# Patient Record
Sex: Female | Born: 1940 | Race: Black or African American | Hispanic: No | Smoking: Former smoker
Health system: Southern US, Community
[De-identification: ages and names within clinical notes are randomized; demographics above are authoritative.]

## PROBLEM LIST (undated history)

## (undated) DIAGNOSIS — E875 Hyperkalemia: Secondary | ICD-10-CM

## (undated) DIAGNOSIS — D638 Anemia in other chronic diseases classified elsewhere: Secondary | ICD-10-CM

## (undated) DIAGNOSIS — Z87442 Personal history of urinary calculi: Secondary | ICD-10-CM

## (undated) DIAGNOSIS — R011 Cardiac murmur, unspecified: Secondary | ICD-10-CM

## (undated) DIAGNOSIS — R609 Edema, unspecified: Secondary | ICD-10-CM

## (undated) DIAGNOSIS — J309 Allergic rhinitis, unspecified: Secondary | ICD-10-CM

## (undated) DIAGNOSIS — M5412 Radiculopathy, cervical region: Secondary | ICD-10-CM

## (undated) DIAGNOSIS — R5381 Other malaise: Secondary | ICD-10-CM

## (undated) DIAGNOSIS — Z9289 Personal history of other medical treatment: Secondary | ICD-10-CM

## (undated) DIAGNOSIS — M549 Dorsalgia, unspecified: Secondary | ICD-10-CM

## (undated) DIAGNOSIS — C3490 Malignant neoplasm of unspecified part of unspecified bronchus or lung: Secondary | ICD-10-CM

## (undated) DIAGNOSIS — D509 Iron deficiency anemia, unspecified: Secondary | ICD-10-CM

## (undated) DIAGNOSIS — R040 Epistaxis: Secondary | ICD-10-CM

## (undated) DIAGNOSIS — R700 Elevated erythrocyte sedimentation rate: Secondary | ICD-10-CM

## (undated) DIAGNOSIS — M76899 Other specified enthesopathies of unspecified lower limb, excluding foot: Secondary | ICD-10-CM

## (undated) DIAGNOSIS — C801 Malignant (primary) neoplasm, unspecified: Secondary | ICD-10-CM

## (undated) DIAGNOSIS — E785 Hyperlipidemia, unspecified: Secondary | ICD-10-CM

## (undated) DIAGNOSIS — N259 Disorder resulting from impaired renal tubular function, unspecified: Secondary | ICD-10-CM

## (undated) DIAGNOSIS — J189 Pneumonia, unspecified organism: Secondary | ICD-10-CM

## (undated) DIAGNOSIS — K222 Esophageal obstruction: Secondary | ICD-10-CM

## (undated) DIAGNOSIS — K219 Gastro-esophageal reflux disease without esophagitis: Secondary | ICD-10-CM

## (undated) DIAGNOSIS — M1710 Unilateral primary osteoarthritis, unspecified knee: Secondary | ICD-10-CM

## (undated) DIAGNOSIS — R1084 Generalized abdominal pain: Secondary | ICD-10-CM

## (undated) DIAGNOSIS — R1310 Dysphagia, unspecified: Secondary | ICD-10-CM

## (undated) DIAGNOSIS — R768 Other specified abnormal immunological findings in serum: Secondary | ICD-10-CM

## (undated) DIAGNOSIS — I10 Essential (primary) hypertension: Secondary | ICD-10-CM

## (undated) DIAGNOSIS — F411 Generalized anxiety disorder: Secondary | ICD-10-CM

## (undated) DIAGNOSIS — F329 Major depressive disorder, single episode, unspecified: Secondary | ICD-10-CM

## (undated) DIAGNOSIS — J449 Chronic obstructive pulmonary disease, unspecified: Secondary | ICD-10-CM

## (undated) DIAGNOSIS — K8020 Calculus of gallbladder without cholecystitis without obstruction: Secondary | ICD-10-CM

## (undated) DIAGNOSIS — M1990 Unspecified osteoarthritis, unspecified site: Secondary | ICD-10-CM

## (undated) DIAGNOSIS — J209 Acute bronchitis, unspecified: Secondary | ICD-10-CM

## (undated) DIAGNOSIS — R5383 Other fatigue: Secondary | ICD-10-CM

## (undated) DIAGNOSIS — M5416 Radiculopathy, lumbar region: Secondary | ICD-10-CM

## (undated) DIAGNOSIS — K3189 Other diseases of stomach and duodenum: Secondary | ICD-10-CM

## (undated) DIAGNOSIS — K2270 Barrett's esophagus without dysplasia: Secondary | ICD-10-CM

## (undated) DIAGNOSIS — I359 Nonrheumatic aortic valve disorder, unspecified: Secondary | ICD-10-CM

## (undated) DIAGNOSIS — M810 Age-related osteoporosis without current pathological fracture: Secondary | ICD-10-CM

## (undated) DIAGNOSIS — M2550 Pain in unspecified joint: Secondary | ICD-10-CM

## (undated) DIAGNOSIS — T148XXD Other injury of unspecified body region, subsequent encounter: Secondary | ICD-10-CM

## (undated) DIAGNOSIS — I319 Disease of pericardium, unspecified: Secondary | ICD-10-CM

## (undated) DIAGNOSIS — G4700 Insomnia, unspecified: Secondary | ICD-10-CM

## (undated) DIAGNOSIS — J329 Chronic sinusitis, unspecified: Secondary | ICD-10-CM

## (undated) DIAGNOSIS — J019 Acute sinusitis, unspecified: Secondary | ICD-10-CM

## (undated) DIAGNOSIS — M542 Cervicalgia: Secondary | ICD-10-CM

## (undated) DIAGNOSIS — L299 Pruritus, unspecified: Secondary | ICD-10-CM

## (undated) DIAGNOSIS — R1013 Epigastric pain: Secondary | ICD-10-CM

## (undated) HISTORY — DX: Cardiac murmur, unspecified: R01.1

## (undated) HISTORY — DX: Disorder resulting from impaired renal tubular function, unspecified: N25.9

## (undated) HISTORY — DX: Dysphagia, unspecified: R13.10

## (undated) HISTORY — DX: Generalized anxiety disorder: F41.1

## (undated) HISTORY — DX: Cervicalgia: M54.2

## (undated) HISTORY — DX: Radiculopathy, cervical region: M54.12

## (undated) HISTORY — DX: Acute sinusitis, unspecified: J01.90

## (undated) HISTORY — DX: Other malaise: R53.81

## (undated) HISTORY — DX: Iron deficiency anemia, unspecified: D50.9

## (undated) HISTORY — DX: Acute bronchitis, unspecified: J20.9

## (undated) HISTORY — DX: Pruritus, unspecified: L29.9

## (undated) HISTORY — DX: Insomnia, unspecified: G47.00

## (undated) HISTORY — DX: Malignant neoplasm of unspecified part of unspecified bronchus or lung: C34.90

## (undated) HISTORY — DX: Chronic obstructive pulmonary disease, unspecified: J44.9

## (undated) HISTORY — DX: Pneumonia, unspecified organism: J18.9

## (undated) HISTORY — DX: Other fatigue: R53.83

## (undated) HISTORY — DX: Personal history of urinary calculi: Z87.442

## (undated) HISTORY — DX: Hyperkalemia: E87.5

## (undated) HISTORY — DX: Other specified abnormal immunological findings in serum: R76.8

## (undated) HISTORY — DX: Anemia in other chronic diseases classified elsewhere: D63.8

## (undated) HISTORY — DX: Chronic sinusitis, unspecified: J32.9

## (undated) HISTORY — DX: Nonrheumatic aortic valve disorder, unspecified: I35.9

## (undated) HISTORY — DX: Barrett's esophagus without dysplasia: K22.70

## (undated) HISTORY — PX: NOSE SURGERY: SHX723

## (undated) HISTORY — DX: Esophageal obstruction: K22.2

## (undated) HISTORY — DX: Allergic rhinitis, unspecified: J30.9

## (undated) HISTORY — DX: Other diseases of stomach and duodenum: K31.89

## (undated) HISTORY — PX: OTHER SURGICAL HISTORY: SHX169

## (undated) HISTORY — DX: Pain in unspecified joint: M25.50

## (undated) HISTORY — DX: Radiculopathy, lumbar region: M54.16

## (undated) HISTORY — DX: Other specified enthesopathies of unspecified lower limb, excluding foot: M76.899

## (undated) HISTORY — DX: Dorsalgia, unspecified: M54.9

## (undated) HISTORY — DX: Edema, unspecified: R60.9

## (undated) HISTORY — DX: Age-related osteoporosis without current pathological fracture: M81.0

## (undated) HISTORY — PX: TUBAL LIGATION: SHX77

## (undated) HISTORY — DX: Essential (primary) hypertension: I10

## (undated) HISTORY — DX: Generalized abdominal pain: R10.84

## (undated) HISTORY — DX: Hyperlipidemia, unspecified: E78.5

## (undated) HISTORY — DX: Disease of pericardium, unspecified: I31.9

## (undated) HISTORY — DX: Epigastric pain: R10.13

## (undated) HISTORY — DX: Calculus of gallbladder without cholecystitis without obstruction: K80.20

## (undated) HISTORY — DX: Unilateral primary osteoarthritis, unspecified knee: M17.10

## (undated) HISTORY — PX: APPENDECTOMY: SHX54

## (undated) HISTORY — DX: Elevated erythrocyte sedimentation rate: R70.0

## (undated) HISTORY — DX: Major depressive disorder, single episode, unspecified: F32.9

## (undated) HISTORY — DX: Gastro-esophageal reflux disease without esophagitis: K21.9

## (undated) HISTORY — DX: Unspecified osteoarthritis, unspecified site: M19.90

## (undated) HISTORY — PX: ABDOMINAL HYSTERECTOMY: SHX81

---

## 1998-12-08 ENCOUNTER — Encounter: Payer: Self-pay | Admitting: Family Medicine

## 1998-12-08 ENCOUNTER — Encounter: Admission: RE | Admit: 1998-12-08 | Discharge: 1998-12-08 | Payer: Self-pay | Admitting: Family Medicine

## 1999-06-10 ENCOUNTER — Encounter: Payer: Self-pay | Admitting: Family Medicine

## 1999-06-10 ENCOUNTER — Encounter: Admission: RE | Admit: 1999-06-10 | Discharge: 1999-06-10 | Payer: Self-pay | Admitting: Family Medicine

## 1999-08-26 ENCOUNTER — Other Ambulatory Visit: Admission: RE | Admit: 1999-08-26 | Discharge: 1999-08-26 | Payer: Self-pay | Admitting: Family Medicine

## 2000-04-17 ENCOUNTER — Emergency Department (HOSPITAL_COMMUNITY): Admission: EM | Admit: 2000-04-17 | Discharge: 2000-04-17 | Payer: Self-pay | Admitting: Vascular Surgery

## 2000-05-17 ENCOUNTER — Encounter: Admission: RE | Admit: 2000-05-17 | Discharge: 2000-05-17 | Payer: Self-pay | Admitting: Family Medicine

## 2000-05-17 ENCOUNTER — Encounter: Payer: Self-pay | Admitting: Family Medicine

## 2000-06-10 ENCOUNTER — Encounter: Payer: Self-pay | Admitting: Family Medicine

## 2000-06-10 ENCOUNTER — Encounter: Admission: RE | Admit: 2000-06-10 | Discharge: 2000-06-10 | Payer: Self-pay | Admitting: Family Medicine

## 2000-09-02 ENCOUNTER — Inpatient Hospital Stay (HOSPITAL_COMMUNITY): Admission: EM | Admit: 2000-09-02 | Discharge: 2000-09-05 | Payer: Self-pay | Admitting: *Deleted

## 2000-09-02 ENCOUNTER — Encounter: Payer: Self-pay | Admitting: Interventional Cardiology

## 2000-09-02 ENCOUNTER — Encounter: Payer: Self-pay | Admitting: Gastroenterology

## 2000-09-05 ENCOUNTER — Encounter: Payer: Self-pay | Admitting: Interventional Cardiology

## 2000-09-19 ENCOUNTER — Encounter: Payer: Self-pay | Admitting: Family Medicine

## 2000-09-19 ENCOUNTER — Encounter: Admission: RE | Admit: 2000-09-19 | Discharge: 2000-09-19 | Payer: Self-pay | Admitting: Family Medicine

## 2000-09-23 ENCOUNTER — Encounter: Admission: RE | Admit: 2000-09-23 | Discharge: 2000-09-23 | Payer: Self-pay | Admitting: Family Medicine

## 2000-09-23 ENCOUNTER — Encounter: Payer: Self-pay | Admitting: Family Medicine

## 2000-10-25 ENCOUNTER — Ambulatory Visit (HOSPITAL_COMMUNITY): Admission: RE | Admit: 2000-10-25 | Discharge: 2000-10-25 | Payer: Self-pay | Admitting: Interventional Cardiology

## 2000-12-14 ENCOUNTER — Other Ambulatory Visit: Admission: RE | Admit: 2000-12-14 | Discharge: 2000-12-14 | Payer: Self-pay | Admitting: Family Medicine

## 2001-06-13 ENCOUNTER — Encounter: Payer: Self-pay | Admitting: Family Medicine

## 2001-06-13 ENCOUNTER — Encounter: Admission: RE | Admit: 2001-06-13 | Discharge: 2001-06-13 | Payer: Self-pay | Admitting: Family Medicine

## 2001-08-23 ENCOUNTER — Encounter: Admission: RE | Admit: 2001-08-23 | Discharge: 2001-08-23 | Payer: Self-pay

## 2001-08-31 ENCOUNTER — Ambulatory Visit (HOSPITAL_COMMUNITY): Admission: RE | Admit: 2001-08-31 | Discharge: 2001-08-31 | Payer: Self-pay | Admitting: Family Medicine

## 2001-08-31 ENCOUNTER — Encounter: Payer: Self-pay | Admitting: Family Medicine

## 2001-10-18 ENCOUNTER — Encounter: Admission: RE | Admit: 2001-10-18 | Discharge: 2001-10-18 | Payer: Self-pay

## 2002-01-12 ENCOUNTER — Encounter: Admission: RE | Admit: 2002-01-12 | Discharge: 2002-01-12 | Payer: Self-pay

## 2002-03-27 ENCOUNTER — Inpatient Hospital Stay (HOSPITAL_COMMUNITY): Admission: RE | Admit: 2002-03-27 | Discharge: 2002-03-29 | Payer: Self-pay

## 2002-03-27 ENCOUNTER — Encounter (INDEPENDENT_AMBULATORY_CARE_PROVIDER_SITE_OTHER): Payer: Self-pay

## 2002-06-15 ENCOUNTER — Encounter: Admission: RE | Admit: 2002-06-15 | Discharge: 2002-06-15 | Payer: Self-pay | Admitting: Family Medicine

## 2002-06-15 ENCOUNTER — Encounter: Payer: Self-pay | Admitting: Family Medicine

## 2002-10-11 ENCOUNTER — Encounter: Payer: Self-pay | Admitting: Family Medicine

## 2002-10-11 ENCOUNTER — Encounter: Admission: RE | Admit: 2002-10-11 | Discharge: 2002-10-11 | Payer: Self-pay | Admitting: Family Medicine

## 2002-12-06 ENCOUNTER — Emergency Department (HOSPITAL_COMMUNITY): Admission: EM | Admit: 2002-12-06 | Discharge: 2002-12-06 | Payer: Self-pay | Admitting: Emergency Medicine

## 2003-06-18 ENCOUNTER — Encounter: Admission: RE | Admit: 2003-06-18 | Discharge: 2003-06-18 | Payer: Self-pay | Admitting: Family Medicine

## 2003-08-01 ENCOUNTER — Encounter: Admission: RE | Admit: 2003-08-01 | Discharge: 2003-08-01 | Payer: Self-pay | Admitting: Family Medicine

## 2003-08-20 ENCOUNTER — Other Ambulatory Visit: Admission: RE | Admit: 2003-08-20 | Discharge: 2003-08-20 | Payer: Self-pay | Admitting: Obstetrics and Gynecology

## 2003-08-25 ENCOUNTER — Encounter: Admission: RE | Admit: 2003-08-25 | Discharge: 2003-08-25 | Payer: Self-pay | Admitting: Family Medicine

## 2003-08-31 ENCOUNTER — Encounter: Admission: RE | Admit: 2003-08-31 | Discharge: 2003-08-31 | Payer: Self-pay | Admitting: Orthopedic Surgery

## 2003-09-02 ENCOUNTER — Observation Stay (HOSPITAL_COMMUNITY): Admission: EM | Admit: 2003-09-02 | Discharge: 2003-09-03 | Payer: Self-pay | Admitting: Emergency Medicine

## 2003-09-11 ENCOUNTER — Ambulatory Visit (HOSPITAL_BASED_OUTPATIENT_CLINIC_OR_DEPARTMENT_OTHER): Admission: RE | Admit: 2003-09-11 | Discharge: 2003-09-11 | Payer: Self-pay | Admitting: Orthopedic Surgery

## 2003-09-11 ENCOUNTER — Ambulatory Visit (HOSPITAL_COMMUNITY): Admission: RE | Admit: 2003-09-11 | Discharge: 2003-09-11 | Payer: Self-pay | Admitting: Orthopedic Surgery

## 2004-04-30 ENCOUNTER — Ambulatory Visit (HOSPITAL_COMMUNITY): Admission: RE | Admit: 2004-04-30 | Discharge: 2004-04-30 | Payer: Self-pay | Admitting: Gastroenterology

## 2004-06-25 ENCOUNTER — Encounter: Admission: RE | Admit: 2004-06-25 | Discharge: 2004-06-25 | Payer: Self-pay | Admitting: Family Medicine

## 2004-08-20 ENCOUNTER — Other Ambulatory Visit: Admission: RE | Admit: 2004-08-20 | Discharge: 2004-08-20 | Payer: Self-pay | Admitting: Obstetrics and Gynecology

## 2005-06-24 ENCOUNTER — Encounter: Admission: RE | Admit: 2005-06-24 | Discharge: 2005-06-24 | Payer: Self-pay | Admitting: Family Medicine

## 2005-06-29 ENCOUNTER — Encounter: Admission: RE | Admit: 2005-06-29 | Discharge: 2005-06-29 | Payer: Self-pay | Admitting: Family Medicine

## 2005-11-23 ENCOUNTER — Encounter: Admission: RE | Admit: 2005-11-23 | Discharge: 2005-11-23 | Payer: Self-pay | Admitting: Family Medicine

## 2006-04-26 ENCOUNTER — Ambulatory Visit: Payer: Self-pay | Admitting: Internal Medicine

## 2006-05-02 ENCOUNTER — Ambulatory Visit: Payer: Self-pay | Admitting: Family Medicine

## 2006-05-02 ENCOUNTER — Ambulatory Visit: Payer: Self-pay | Admitting: Endocrinology

## 2006-05-02 LAB — CONVERTED CEMR LAB
Albumin: 3.6 g/dL (ref 3.5–5.2)
Alkaline Phosphatase: 59 units/L (ref 39–117)
BUN: 19 mg/dL (ref 6–23)
Basophils Absolute: 0 10*3/uL (ref 0.0–0.1)
Bilirubin Urine: NEGATIVE
Cholesterol: 203 mg/dL (ref 0–200)
Direct LDL: 141.9 mg/dL
Eosinophils Absolute: 0.1 10*3/uL (ref 0.0–0.6)
GFR calc Af Amer: 49 mL/min
GFR calc non Af Amer: 40 mL/min
HCT: 37.4 % (ref 36.0–46.0)
HDL: 38.1 mg/dL — ABNORMAL LOW (ref >39.0)
Hemoglobin, Urine: NEGATIVE
Hemoglobin: 12.9 g/dL (ref 12.0–15.0)
Ketones, ur: NEGATIVE mg/dL
Leukocytes, UA: NEGATIVE
MCHC: 34.4 g/dL (ref 30.0–36.0)
MCV: 102.3 fL — ABNORMAL HIGH (ref 78.0–100.0)
Monocytes Absolute: 0.3 10*3/uL (ref 0.2–0.7)
Monocytes Relative: 7.2 % (ref 3.0–11.0)
Neutrophils Relative %: 51.2 % (ref 43.0–77.0)
Potassium: 4.6 meq/L (ref 3.5–5.1)
RDW: 12.9 % (ref 11.5–14.6)
Sodium: 141 meq/L (ref 135–145)
TSH: 1.19 microintl units/mL (ref 0.35–5.50)
Total CHOL/HDL Ratio: 5.3
Urobilinogen, UA: 0.2 (ref 0.0–1.0)
VLDL: 25 mg/dL (ref 0–40)
pH: 6 (ref 5.0–8.0)

## 2006-05-18 ENCOUNTER — Ambulatory Visit: Payer: Self-pay | Admitting: Internal Medicine

## 2006-06-30 ENCOUNTER — Ambulatory Visit: Payer: Self-pay | Admitting: Internal Medicine

## 2006-07-01 ENCOUNTER — Encounter: Admission: RE | Admit: 2006-07-01 | Discharge: 2006-07-01 | Payer: Self-pay | Admitting: Internal Medicine

## 2006-07-21 ENCOUNTER — Emergency Department (HOSPITAL_COMMUNITY): Admission: EM | Admit: 2006-07-21 | Discharge: 2006-07-21 | Payer: Self-pay | Admitting: Emergency Medicine

## 2006-08-03 ENCOUNTER — Ambulatory Visit: Payer: Self-pay | Admitting: Internal Medicine

## 2006-11-15 ENCOUNTER — Ambulatory Visit: Payer: Self-pay | Admitting: Internal Medicine

## 2006-11-18 ENCOUNTER — Encounter: Payer: Self-pay | Admitting: Internal Medicine

## 2006-11-18 DIAGNOSIS — M1990 Unspecified osteoarthritis, unspecified site: Secondary | ICD-10-CM | POA: Insufficient documentation

## 2006-11-18 DIAGNOSIS — M810 Age-related osteoporosis without current pathological fracture: Secondary | ICD-10-CM

## 2006-11-18 DIAGNOSIS — E785 Hyperlipidemia, unspecified: Secondary | ICD-10-CM | POA: Insufficient documentation

## 2006-11-18 DIAGNOSIS — I10 Essential (primary) hypertension: Secondary | ICD-10-CM

## 2006-11-18 DIAGNOSIS — J309 Allergic rhinitis, unspecified: Secondary | ICD-10-CM | POA: Insufficient documentation

## 2006-11-18 DIAGNOSIS — R011 Cardiac murmur, unspecified: Secondary | ICD-10-CM

## 2006-11-18 HISTORY — DX: Allergic rhinitis, unspecified: J30.9

## 2006-11-18 HISTORY — DX: Age-related osteoporosis without current pathological fracture: M81.0

## 2006-11-18 HISTORY — DX: Essential (primary) hypertension: I10

## 2006-11-18 HISTORY — DX: Unspecified osteoarthritis, unspecified site: M19.90

## 2006-11-18 HISTORY — DX: Hyperlipidemia, unspecified: E78.5

## 2006-11-18 HISTORY — DX: Cardiac murmur, unspecified: R01.1

## 2006-12-26 ENCOUNTER — Telehealth (INDEPENDENT_AMBULATORY_CARE_PROVIDER_SITE_OTHER): Payer: Self-pay | Admitting: *Deleted

## 2007-01-03 DIAGNOSIS — K219 Gastro-esophageal reflux disease without esophagitis: Secondary | ICD-10-CM

## 2007-01-03 DIAGNOSIS — I319 Disease of pericardium, unspecified: Secondary | ICD-10-CM | POA: Insufficient documentation

## 2007-01-03 DIAGNOSIS — IMO0002 Reserved for concepts with insufficient information to code with codable children: Secondary | ICD-10-CM

## 2007-01-03 DIAGNOSIS — M1710 Unilateral primary osteoarthritis, unspecified knee: Secondary | ICD-10-CM

## 2007-01-03 HISTORY — DX: Disease of pericardium, unspecified: I31.9

## 2007-01-03 HISTORY — DX: Reserved for concepts with insufficient information to code with codable children: IMO0002

## 2007-01-03 HISTORY — DX: Gastro-esophageal reflux disease without esophagitis: K21.9

## 2007-01-23 ENCOUNTER — Encounter: Payer: Self-pay | Admitting: Internal Medicine

## 2007-03-01 ENCOUNTER — Ambulatory Visit: Payer: Self-pay | Admitting: Internal Medicine

## 2007-03-01 ENCOUNTER — Encounter (INDEPENDENT_AMBULATORY_CARE_PROVIDER_SITE_OTHER): Payer: Self-pay | Admitting: *Deleted

## 2007-03-01 DIAGNOSIS — N390 Urinary tract infection, site not specified: Secondary | ICD-10-CM | POA: Insufficient documentation

## 2007-03-01 DIAGNOSIS — R1084 Generalized abdominal pain: Secondary | ICD-10-CM | POA: Insufficient documentation

## 2007-03-01 HISTORY — DX: Generalized abdominal pain: R10.84

## 2007-03-09 ENCOUNTER — Encounter: Admission: RE | Admit: 2007-03-09 | Discharge: 2007-03-09 | Payer: Self-pay | Admitting: Internal Medicine

## 2007-03-23 ENCOUNTER — Ambulatory Visit: Payer: Self-pay | Admitting: Gastroenterology

## 2007-03-23 DIAGNOSIS — R1013 Epigastric pain: Secondary | ICD-10-CM

## 2007-03-23 DIAGNOSIS — R1310 Dysphagia, unspecified: Secondary | ICD-10-CM

## 2007-03-23 DIAGNOSIS — K3189 Other diseases of stomach and duodenum: Secondary | ICD-10-CM

## 2007-03-23 HISTORY — DX: Dysphagia, unspecified: R13.10

## 2007-03-23 HISTORY — DX: Other diseases of stomach and duodenum: R10.13

## 2007-03-23 HISTORY — DX: Other diseases of stomach and duodenum: K31.89

## 2007-03-27 ENCOUNTER — Ambulatory Visit: Payer: Self-pay | Admitting: Gastroenterology

## 2007-03-27 ENCOUNTER — Encounter: Payer: Self-pay | Admitting: Gastroenterology

## 2007-03-27 ENCOUNTER — Encounter: Payer: Self-pay | Admitting: Internal Medicine

## 2007-04-05 ENCOUNTER — Encounter: Payer: Self-pay | Admitting: Internal Medicine

## 2007-04-14 ENCOUNTER — Telehealth (INDEPENDENT_AMBULATORY_CARE_PROVIDER_SITE_OTHER): Payer: Self-pay | Admitting: *Deleted

## 2007-04-26 ENCOUNTER — Ambulatory Visit: Payer: Self-pay | Admitting: Internal Medicine

## 2007-04-26 DIAGNOSIS — M79609 Pain in unspecified limb: Secondary | ICD-10-CM | POA: Insufficient documentation

## 2007-04-27 LAB — CONVERTED CEMR LAB
Basophils Relative: 0.1 % (ref 0.0–1.0)
Bilirubin Urine: NEGATIVE
Bilirubin, Direct: 0.1 mg/dL (ref 0.0–0.3)
CO2: 24 meq/L (ref 19–32)
Cholesterol: 153 mg/dL (ref 0–200)
Crystals: NEGATIVE
Eosinophils Absolute: 0.1 10*3/uL (ref 0.0–0.6)
Eosinophils Relative: 3 % (ref 0.0–5.0)
GFR calc Af Amer: 48 mL/min
Glucose, Bld: 85 mg/dL (ref 70–99)
HDL: 41.6 mg/dL (ref >39.0)
Hemoglobin: 10.3 g/dL — ABNORMAL LOW (ref 12.0–15.0)
Lymphocytes Relative: 36.9 % (ref 12.0–46.0)
MCV: 100.3 fL — ABNORMAL HIGH (ref 78.0–100.0)
Monocytes Absolute: 0.4 10*3/uL (ref 0.2–0.7)
Monocytes Relative: 8.6 % (ref 3.0–11.0)
Mucus, UA: NEGATIVE
Neutro Abs: 2.3 10*3/uL (ref 1.4–7.7)
Potassium: 4.6 meq/L (ref 3.5–5.1)
Specific Gravity, Urine: <=1.005 (ref 1.000–1.03)
TSH: 1.24 microintl units/mL (ref 0.35–5.50)
Total CHOL/HDL Ratio: 3.7
Total Protein, Urine: NEGATIVE mg/dL
Total Protein: 7.1 g/dL (ref 6.0–8.3)
Triglycerides: 104 mg/dL (ref 0–149)

## 2007-04-28 ENCOUNTER — Telehealth: Payer: Self-pay | Admitting: Internal Medicine

## 2007-04-28 ENCOUNTER — Ambulatory Visit: Payer: Self-pay | Admitting: Internal Medicine

## 2007-05-02 DIAGNOSIS — J189 Pneumonia, unspecified organism: Secondary | ICD-10-CM

## 2007-05-02 HISTORY — DX: Pneumonia, unspecified organism: J18.9

## 2007-05-03 ENCOUNTER — Ambulatory Visit: Payer: Self-pay | Admitting: Internal Medicine

## 2007-05-10 ENCOUNTER — Ambulatory Visit: Payer: Self-pay | Admitting: Gastroenterology

## 2007-07-03 ENCOUNTER — Encounter: Admission: RE | Admit: 2007-07-03 | Discharge: 2007-07-03 | Payer: Self-pay | Admitting: Internal Medicine

## 2007-11-20 ENCOUNTER — Ambulatory Visit: Payer: Self-pay | Admitting: Internal Medicine

## 2007-11-20 ENCOUNTER — Telehealth: Payer: Self-pay | Admitting: Internal Medicine

## 2007-11-20 DIAGNOSIS — J019 Acute sinusitis, unspecified: Secondary | ICD-10-CM

## 2007-11-20 DIAGNOSIS — R350 Frequency of micturition: Secondary | ICD-10-CM | POA: Insufficient documentation

## 2007-11-20 HISTORY — DX: Acute sinusitis, unspecified: J01.90

## 2007-11-20 LAB — CONVERTED CEMR LAB
Crystals: NEGATIVE
Hemoglobin, Urine: NEGATIVE
Nitrite: POSITIVE — AB
Urobilinogen, UA: 0.2 (ref 0.0–1.0)

## 2007-11-22 ENCOUNTER — Telehealth (INDEPENDENT_AMBULATORY_CARE_PROVIDER_SITE_OTHER): Payer: Self-pay | Admitting: *Deleted

## 2007-11-27 ENCOUNTER — Encounter: Payer: Self-pay | Admitting: Internal Medicine

## 2008-01-29 ENCOUNTER — Ambulatory Visit: Payer: Self-pay | Admitting: Internal Medicine

## 2008-01-29 LAB — CONVERTED CEMR LAB
Basophils Relative: 0.1 % (ref 0.0–3.0)
Bilirubin, Direct: 0.1 mg/dL (ref 0.0–0.3)
Calcium: 8.7 mg/dL (ref 8.4–10.5)
Cholesterol: 126 mg/dL (ref 0–200)
Crystals: NEGATIVE
Eosinophils Relative: 3.4 % (ref 0.0–5.0)
GFR calc Af Amer: 53 mL/min
GFR calc non Af Amer: 43 mL/min
Glucose, Bld: 77 mg/dL (ref 70–99)
LDL Cholesterol: 81 mg/dL (ref 0–99)
Lymphocytes Relative: 24.1 % (ref 12.0–46.0)
MCV: 98.3 fL (ref 78.0–100.0)
Monocytes Relative: 11.2 % (ref 3.0–12.0)
Neutrophils Relative %: 61.2 % (ref 43.0–77.0)
Potassium: 5.6 meq/L — ABNORMAL HIGH (ref 3.5–5.1)
RBC / HPF: NONE SEEN
RBC: 3.02 M/uL — ABNORMAL LOW (ref 3.87–5.11)
Sodium: 137 meq/L (ref 135–145)
Specific Gravity, Urine: <=1.005 (ref 1.000–1.03)
Total Bilirubin: 0.4 mg/dL (ref 0.3–1.2)
Total Protein: 6.8 g/dL (ref 6.0–8.3)
Urine Glucose: NEGATIVE mg/dL
Urobilinogen, UA: 0.2 (ref 0.0–1.0)
WBC: 4.7 10*3/uL (ref 4.5–10.5)

## 2008-02-05 ENCOUNTER — Ambulatory Visit: Payer: Self-pay | Admitting: Internal Medicine

## 2008-02-05 DIAGNOSIS — J329 Chronic sinusitis, unspecified: Secondary | ICD-10-CM

## 2008-02-05 DIAGNOSIS — N259 Disorder resulting from impaired renal tubular function, unspecified: Secondary | ICD-10-CM

## 2008-02-05 DIAGNOSIS — E875 Hyperkalemia: Secondary | ICD-10-CM | POA: Insufficient documentation

## 2008-02-05 DIAGNOSIS — D638 Anemia in other chronic diseases classified elsewhere: Secondary | ICD-10-CM | POA: Insufficient documentation

## 2008-02-05 HISTORY — DX: Chronic sinusitis, unspecified: J32.9

## 2008-02-05 HISTORY — DX: Hyperkalemia: E87.5

## 2008-02-05 HISTORY — DX: Disorder resulting from impaired renal tubular function, unspecified: N25.9

## 2008-02-05 HISTORY — DX: Anemia in other chronic diseases classified elsewhere: D63.8

## 2008-02-22 ENCOUNTER — Encounter: Payer: Self-pay | Admitting: Internal Medicine

## 2008-02-26 ENCOUNTER — Ambulatory Visit: Payer: Self-pay | Admitting: Internal Medicine

## 2008-02-27 LAB — CONVERTED CEMR LAB
BUN: 23 mg/dL (ref 6–23)
Basophils Relative: 0 % (ref 0.0–3.0)
CO2: 25 meq/L (ref 19–32)
Chloride: 101 meq/L (ref 96–112)
Creatinine, Ser: 1.6 mg/dL — ABNORMAL HIGH (ref 0.4–1.2)
Eosinophils Absolute: 0.1 10*3/uL (ref 0.0–0.7)
Eosinophils Relative: 2.3 % (ref 0.0–5.0)
Hemoglobin: 11.2 g/dL — ABNORMAL LOW (ref 12.0–15.0)
MCV: 98.8 fL (ref 78.0–100.0)
Neutro Abs: 3.2 10*3/uL (ref 1.4–7.7)
Neutrophils Relative %: 72.6 % (ref 43.0–77.0)
RBC: 3.38 M/uL — ABNORMAL LOW (ref 3.87–5.11)
Sed Rate: 86 mm/hr — ABNORMAL HIGH (ref 0–22)
Vitamin B-12: 643 pg/mL (ref 211–911)
WBC: 4.4 10*3/uL — ABNORMAL LOW (ref 4.5–10.5)

## 2008-03-05 ENCOUNTER — Ambulatory Visit: Payer: Self-pay | Admitting: Internal Medicine

## 2008-03-13 ENCOUNTER — Ambulatory Visit: Payer: Self-pay | Admitting: Internal Medicine

## 2008-05-13 ENCOUNTER — Ambulatory Visit: Payer: Self-pay | Admitting: Internal Medicine

## 2008-05-13 ENCOUNTER — Encounter: Payer: Self-pay | Admitting: Internal Medicine

## 2008-05-21 ENCOUNTER — Telehealth: Payer: Self-pay | Admitting: Internal Medicine

## 2008-05-28 ENCOUNTER — Ambulatory Visit: Payer: Self-pay | Admitting: Internal Medicine

## 2008-05-29 LAB — CONVERTED CEMR LAB
Nitrite: NEGATIVE
Specific Gravity, Urine: 1.02 (ref 1.000–1.030)
Urobilinogen, UA: 0.2 (ref 0.0–1.0)

## 2008-06-02 ENCOUNTER — Emergency Department (HOSPITAL_COMMUNITY): Admission: EM | Admit: 2008-06-02 | Discharge: 2008-06-02 | Payer: Self-pay | Admitting: Emergency Medicine

## 2008-06-03 ENCOUNTER — Ambulatory Visit: Payer: Self-pay | Admitting: Internal Medicine

## 2008-06-03 DIAGNOSIS — G4700 Insomnia, unspecified: Secondary | ICD-10-CM

## 2008-06-03 DIAGNOSIS — F411 Generalized anxiety disorder: Secondary | ICD-10-CM

## 2008-06-03 DIAGNOSIS — R930 Abnormal findings on diagnostic imaging of skull and head, not elsewhere classified: Secondary | ICD-10-CM

## 2008-06-03 HISTORY — DX: Generalized anxiety disorder: F41.1

## 2008-06-03 HISTORY — DX: Insomnia, unspecified: G47.00

## 2008-06-04 ENCOUNTER — Ambulatory Visit: Payer: Self-pay | Admitting: Internal Medicine

## 2008-06-05 ENCOUNTER — Telehealth (INDEPENDENT_AMBULATORY_CARE_PROVIDER_SITE_OTHER): Payer: Self-pay | Admitting: *Deleted

## 2008-06-06 ENCOUNTER — Ambulatory Visit: Payer: Self-pay | Admitting: Internal Medicine

## 2008-06-12 ENCOUNTER — Ambulatory Visit: Payer: Self-pay | Admitting: Critical Care Medicine

## 2008-06-13 ENCOUNTER — Telehealth: Payer: Self-pay | Admitting: Critical Care Medicine

## 2008-06-17 ENCOUNTER — Telehealth: Payer: Self-pay | Admitting: Internal Medicine

## 2008-06-20 ENCOUNTER — Ambulatory Visit: Payer: Self-pay | Admitting: Critical Care Medicine

## 2008-06-20 ENCOUNTER — Ambulatory Visit: Admission: RE | Admit: 2008-06-20 | Discharge: 2008-06-20 | Payer: Self-pay | Admitting: Critical Care Medicine

## 2008-06-20 ENCOUNTER — Encounter: Payer: Self-pay | Admitting: Critical Care Medicine

## 2008-06-23 ENCOUNTER — Encounter: Payer: Self-pay | Admitting: Critical Care Medicine

## 2008-06-23 DIAGNOSIS — C3492 Malignant neoplasm of unspecified part of left bronchus or lung: Secondary | ICD-10-CM

## 2008-06-23 DIAGNOSIS — C3490 Malignant neoplasm of unspecified part of unspecified bronchus or lung: Secondary | ICD-10-CM

## 2008-06-23 HISTORY — DX: Malignant neoplasm of unspecified part of unspecified bronchus or lung: C34.90

## 2008-06-25 ENCOUNTER — Ambulatory Visit: Payer: Self-pay | Admitting: Critical Care Medicine

## 2008-06-26 ENCOUNTER — Ambulatory Visit: Payer: Self-pay | Admitting: Internal Medicine

## 2008-06-27 ENCOUNTER — Encounter: Payer: Self-pay | Admitting: Internal Medicine

## 2008-06-27 ENCOUNTER — Ambulatory Visit: Payer: Self-pay | Admitting: Surgery

## 2008-06-27 ENCOUNTER — Ambulatory Visit: Payer: Self-pay | Admitting: Internal Medicine

## 2008-06-27 ENCOUNTER — Ambulatory Visit (HOSPITAL_COMMUNITY): Admission: RE | Admit: 2008-06-27 | Discharge: 2008-06-27 | Payer: Self-pay | Admitting: Critical Care Medicine

## 2008-06-27 ENCOUNTER — Encounter: Payer: Self-pay | Admitting: Critical Care Medicine

## 2008-06-27 ENCOUNTER — Ambulatory Visit: Payer: Self-pay | Admitting: Pulmonary Disease

## 2008-06-27 DIAGNOSIS — J449 Chronic obstructive pulmonary disease, unspecified: Secondary | ICD-10-CM | POA: Insufficient documentation

## 2008-06-27 DIAGNOSIS — J4489 Other specified chronic obstructive pulmonary disease: Secondary | ICD-10-CM | POA: Insufficient documentation

## 2008-06-27 HISTORY — DX: Other specified chronic obstructive pulmonary disease: J44.89

## 2008-06-27 HISTORY — DX: Chronic obstructive pulmonary disease, unspecified: J44.9

## 2008-07-01 ENCOUNTER — Ambulatory Visit: Payer: Self-pay | Admitting: Cardiovascular Disease

## 2008-07-02 ENCOUNTER — Ambulatory Visit: Payer: Self-pay

## 2008-07-02 ENCOUNTER — Encounter: Payer: Self-pay | Admitting: Surgery

## 2008-07-04 ENCOUNTER — Ambulatory Visit (HOSPITAL_COMMUNITY): Admission: RE | Admit: 2008-07-04 | Discharge: 2008-07-04 | Payer: Self-pay | Admitting: Surgery

## 2008-07-04 ENCOUNTER — Encounter: Payer: Self-pay | Admitting: Critical Care Medicine

## 2008-07-04 ENCOUNTER — Encounter: Payer: Self-pay | Admitting: Surgery

## 2008-07-04 ENCOUNTER — Ambulatory Visit: Payer: Self-pay | Admitting: Surgery

## 2008-07-08 ENCOUNTER — Telehealth (INDEPENDENT_AMBULATORY_CARE_PROVIDER_SITE_OTHER): Payer: Self-pay | Admitting: *Deleted

## 2008-07-09 ENCOUNTER — Encounter: Payer: Self-pay | Admitting: Critical Care Medicine

## 2008-07-12 ENCOUNTER — Ambulatory Visit (HOSPITAL_COMMUNITY): Admission: RE | Admit: 2008-07-12 | Discharge: 2008-07-12 | Payer: Self-pay | Admitting: Internal Medicine

## 2008-07-16 ENCOUNTER — Ambulatory Visit: Payer: Self-pay | Admitting: Surgery

## 2008-07-17 ENCOUNTER — Telehealth (INDEPENDENT_AMBULATORY_CARE_PROVIDER_SITE_OTHER): Payer: Self-pay | Admitting: *Deleted

## 2008-07-17 ENCOUNTER — Ambulatory Visit: Payer: Self-pay | Admitting: Thoracic Surgery

## 2008-07-18 ENCOUNTER — Telehealth (INDEPENDENT_AMBULATORY_CARE_PROVIDER_SITE_OTHER): Payer: Self-pay | Admitting: *Deleted

## 2008-07-22 ENCOUNTER — Encounter: Payer: Self-pay | Admitting: Internal Medicine

## 2008-08-06 ENCOUNTER — Ambulatory Visit: Payer: Self-pay | Admitting: Thoracic Surgery

## 2008-08-06 ENCOUNTER — Inpatient Hospital Stay (HOSPITAL_COMMUNITY): Admission: RE | Admit: 2008-08-06 | Discharge: 2008-08-15 | Payer: Self-pay | Admitting: Thoracic Surgery

## 2008-08-06 ENCOUNTER — Encounter: Payer: Self-pay | Admitting: Thoracic Surgery

## 2008-08-06 HISTORY — PX: TUMOR REMOVAL: SHX12

## 2008-08-12 ENCOUNTER — Telehealth: Payer: Self-pay | Admitting: Internal Medicine

## 2008-08-16 ENCOUNTER — Encounter: Payer: Self-pay | Admitting: Internal Medicine

## 2008-08-21 ENCOUNTER — Ambulatory Visit: Payer: Self-pay | Admitting: Thoracic Surgery

## 2008-08-21 ENCOUNTER — Encounter: Payer: Self-pay | Admitting: Internal Medicine

## 2008-08-21 ENCOUNTER — Encounter: Admission: RE | Admit: 2008-08-21 | Discharge: 2008-08-21 | Payer: Self-pay | Admitting: Thoracic Surgery

## 2008-09-04 ENCOUNTER — Encounter: Payer: Self-pay | Admitting: Internal Medicine

## 2008-09-06 ENCOUNTER — Encounter: Payer: Self-pay | Admitting: Internal Medicine

## 2008-09-10 ENCOUNTER — Encounter: Payer: Self-pay | Admitting: Internal Medicine

## 2008-09-11 ENCOUNTER — Ambulatory Visit: Payer: Self-pay | Admitting: Thoracic Surgery

## 2008-09-11 ENCOUNTER — Encounter: Admission: RE | Admit: 2008-09-11 | Discharge: 2008-09-11 | Payer: Self-pay | Admitting: Thoracic Surgery

## 2008-09-13 ENCOUNTER — Telehealth: Payer: Self-pay | Admitting: Internal Medicine

## 2008-09-18 ENCOUNTER — Ambulatory Visit: Payer: Self-pay | Admitting: Internal Medicine

## 2008-09-22 DIAGNOSIS — D509 Iron deficiency anemia, unspecified: Secondary | ICD-10-CM

## 2008-09-22 HISTORY — DX: Iron deficiency anemia, unspecified: D50.9

## 2008-09-24 ENCOUNTER — Ambulatory Visit: Payer: Self-pay | Admitting: Internal Medicine

## 2008-09-24 ENCOUNTER — Telehealth (INDEPENDENT_AMBULATORY_CARE_PROVIDER_SITE_OTHER): Payer: Self-pay | Admitting: *Deleted

## 2008-09-24 ENCOUNTER — Encounter: Payer: Self-pay | Admitting: Internal Medicine

## 2008-09-24 LAB — CBC WITH DIFFERENTIAL/PLATELET
BASO%: 0.5 % (ref 0.0–2.0)
Basophils Absolute: 0 10*3/uL (ref 0.0–0.1)
EOS%: 3.5 % (ref 0.0–7.0)
HCT: 29.9 % — ABNORMAL LOW (ref 34.8–46.6)
HGB: 10.3 g/dL — ABNORMAL LOW (ref 11.6–15.9)
MCH: 33.5 pg (ref 25.1–34.0)
MCHC: 34.5 g/dL (ref 31.5–36.0)
MONO#: 0.7 10*3/uL (ref 0.1–0.9)
NEUT%: 62.9 % (ref 38.4–76.8)
RDW: 13.6 % (ref 11.2–14.5)
WBC: 5.6 10*3/uL (ref 3.9–10.3)
lymph#: 1.2 10*3/uL (ref 0.9–3.3)

## 2008-09-24 LAB — COMPREHENSIVE METABOLIC PANEL
ALT: 15 U/L (ref 0–35)
AST: 10 U/L (ref 0–37)
Albumin: 3.8 g/dL (ref 3.5–5.2)
CO2: 22 mEq/L (ref 19–32)
Calcium: 8.8 mg/dL (ref 8.4–10.5)
Chloride: 95 mEq/L — ABNORMAL LOW (ref 96–112)
Potassium: 4.4 mEq/L (ref 3.5–5.3)

## 2008-10-08 ENCOUNTER — Ambulatory Visit: Payer: Self-pay | Admitting: Internal Medicine

## 2008-10-08 DIAGNOSIS — L299 Pruritus, unspecified: Secondary | ICD-10-CM | POA: Insufficient documentation

## 2008-10-08 HISTORY — DX: Pruritus, unspecified: L29.9

## 2008-10-08 LAB — CONVERTED CEMR LAB
Ketones, ur: NEGATIVE mg/dL
Specific Gravity, Urine: 1.01 (ref 1.000–1.030)
Total Protein, Urine: NEGATIVE mg/dL
Urine Glucose: NEGATIVE mg/dL
pH: 5.5 (ref 5.0–8.0)

## 2008-10-09 ENCOUNTER — Encounter: Admission: RE | Admit: 2008-10-09 | Discharge: 2008-10-09 | Payer: Self-pay | Admitting: Thoracic Surgery

## 2008-10-09 ENCOUNTER — Ambulatory Visit: Payer: Self-pay | Admitting: Thoracic Surgery

## 2008-10-09 ENCOUNTER — Encounter: Payer: Self-pay | Admitting: Internal Medicine

## 2008-10-16 ENCOUNTER — Telehealth (INDEPENDENT_AMBULATORY_CARE_PROVIDER_SITE_OTHER): Payer: Self-pay | Admitting: *Deleted

## 2008-10-17 ENCOUNTER — Telehealth: Payer: Self-pay | Admitting: Internal Medicine

## 2008-10-28 ENCOUNTER — Encounter (INDEPENDENT_AMBULATORY_CARE_PROVIDER_SITE_OTHER): Payer: Self-pay | Admitting: *Deleted

## 2008-10-28 ENCOUNTER — Ambulatory Visit: Payer: Self-pay | Admitting: Internal Medicine

## 2008-11-14 ENCOUNTER — Ambulatory Visit: Payer: Self-pay | Admitting: Gastroenterology

## 2008-11-14 DIAGNOSIS — K2270 Barrett's esophagus without dysplasia: Secondary | ICD-10-CM

## 2008-11-14 DIAGNOSIS — K222 Esophageal obstruction: Secondary | ICD-10-CM

## 2008-11-14 HISTORY — DX: Esophageal obstruction: K22.2

## 2008-11-14 HISTORY — DX: Barrett's esophagus without dysplasia: K22.70

## 2008-11-15 ENCOUNTER — Encounter: Payer: Self-pay | Admitting: Gastroenterology

## 2008-11-15 ENCOUNTER — Ambulatory Visit: Payer: Self-pay | Admitting: Gastroenterology

## 2008-11-20 ENCOUNTER — Encounter: Payer: Self-pay | Admitting: Gastroenterology

## 2008-11-21 ENCOUNTER — Encounter (INDEPENDENT_AMBULATORY_CARE_PROVIDER_SITE_OTHER): Payer: Self-pay | Admitting: *Deleted

## 2008-11-26 ENCOUNTER — Ambulatory Visit: Payer: Self-pay | Admitting: Internal Medicine

## 2008-11-26 LAB — CONVERTED CEMR LAB
Bilirubin Urine: NEGATIVE
Hemoglobin, Urine: NEGATIVE
Nitrite: NEGATIVE
Total Protein, Urine: NEGATIVE mg/dL

## 2008-12-03 ENCOUNTER — Ambulatory Visit: Payer: Self-pay | Admitting: Cardiovascular Disease

## 2008-12-03 DIAGNOSIS — I359 Nonrheumatic aortic valve disorder, unspecified: Secondary | ICD-10-CM | POA: Insufficient documentation

## 2008-12-03 HISTORY — DX: Nonrheumatic aortic valve disorder, unspecified: I35.9

## 2008-12-10 ENCOUNTER — Telehealth: Payer: Self-pay | Admitting: Internal Medicine

## 2008-12-13 ENCOUNTER — Ambulatory Visit: Payer: Self-pay | Admitting: Internal Medicine

## 2008-12-16 ENCOUNTER — Ambulatory Visit: Payer: Self-pay | Admitting: Cardiology

## 2009-01-03 ENCOUNTER — Ambulatory Visit: Payer: Self-pay | Admitting: Internal Medicine

## 2009-01-03 ENCOUNTER — Encounter: Payer: Self-pay | Admitting: Internal Medicine

## 2009-01-03 LAB — CONVERTED CEMR LAB
Ketones, ur: NEGATIVE mg/dL
Leukocytes, UA: NEGATIVE
Nitrite: NEGATIVE
Specific Gravity, Urine: <=1.005 (ref 1.000–1.030)
pH: 5.5 (ref 5.0–8.0)

## 2009-01-06 ENCOUNTER — Telehealth: Payer: Self-pay | Admitting: Internal Medicine

## 2009-01-07 ENCOUNTER — Ambulatory Visit: Payer: Self-pay | Admitting: Thoracic Surgery

## 2009-01-07 ENCOUNTER — Encounter: Admission: RE | Admit: 2009-01-07 | Discharge: 2009-01-07 | Payer: Self-pay | Admitting: Thoracic Surgery

## 2009-01-13 ENCOUNTER — Ambulatory Visit: Payer: Self-pay | Admitting: Internal Medicine

## 2009-01-13 DIAGNOSIS — M542 Cervicalgia: Secondary | ICD-10-CM | POA: Insufficient documentation

## 2009-01-13 DIAGNOSIS — M5412 Radiculopathy, cervical region: Secondary | ICD-10-CM | POA: Insufficient documentation

## 2009-01-13 HISTORY — DX: Radiculopathy, cervical region: M54.12

## 2009-01-13 HISTORY — DX: Cervicalgia: M54.2

## 2009-01-13 LAB — CONVERTED CEMR LAB
Bilirubin Urine: NEGATIVE
Hemoglobin, Urine: NEGATIVE
Ketones, ur: NEGATIVE mg/dL
Leukocytes, UA: NEGATIVE
Specific Gravity, Urine: <=1.005 (ref 1.000–1.030)
Urobilinogen, UA: 0.2 (ref 0.0–1.0)

## 2009-01-14 ENCOUNTER — Encounter: Payer: Self-pay | Admitting: Internal Medicine

## 2009-01-15 ENCOUNTER — Telehealth: Payer: Self-pay | Admitting: Internal Medicine

## 2009-01-17 ENCOUNTER — Telehealth: Payer: Self-pay | Admitting: Internal Medicine

## 2009-02-04 ENCOUNTER — Ambulatory Visit: Payer: Self-pay | Admitting: Internal Medicine

## 2009-02-04 DIAGNOSIS — L02419 Cutaneous abscess of limb, unspecified: Secondary | ICD-10-CM | POA: Insufficient documentation

## 2009-02-04 DIAGNOSIS — L03119 Cellulitis of unspecified part of limb: Secondary | ICD-10-CM

## 2009-02-04 DIAGNOSIS — M549 Dorsalgia, unspecified: Secondary | ICD-10-CM | POA: Insufficient documentation

## 2009-02-04 HISTORY — DX: Dorsalgia, unspecified: M54.9

## 2009-02-06 ENCOUNTER — Ambulatory Visit (HOSPITAL_COMMUNITY): Admission: RE | Admit: 2009-02-06 | Discharge: 2009-02-06 | Payer: Self-pay | Admitting: Internal Medicine

## 2009-02-12 ENCOUNTER — Telehealth: Payer: Self-pay | Admitting: Internal Medicine

## 2009-02-13 ENCOUNTER — Ambulatory Visit: Payer: Self-pay | Admitting: Internal Medicine

## 2009-02-13 DIAGNOSIS — R609 Edema, unspecified: Secondary | ICD-10-CM

## 2009-02-13 HISTORY — DX: Edema, unspecified: R60.9

## 2009-02-14 ENCOUNTER — Encounter (INDEPENDENT_AMBULATORY_CARE_PROVIDER_SITE_OTHER): Payer: Self-pay | Admitting: Emergency Medicine

## 2009-02-14 ENCOUNTER — Emergency Department (HOSPITAL_COMMUNITY): Admission: EM | Admit: 2009-02-14 | Discharge: 2009-02-14 | Payer: Self-pay | Admitting: Emergency Medicine

## 2009-02-14 ENCOUNTER — Ambulatory Visit: Payer: Self-pay | Admitting: Vascular Surgery

## 2009-02-17 ENCOUNTER — Ambulatory Visit: Payer: Self-pay | Admitting: Internal Medicine

## 2009-02-19 ENCOUNTER — Encounter: Payer: Self-pay | Admitting: Internal Medicine

## 2009-02-26 ENCOUNTER — Telehealth: Payer: Self-pay | Admitting: Internal Medicine

## 2009-02-27 ENCOUNTER — Ambulatory Visit: Payer: Self-pay | Admitting: Internal Medicine

## 2009-03-20 ENCOUNTER — Ambulatory Visit: Payer: Self-pay | Admitting: Internal Medicine

## 2009-03-21 ENCOUNTER — Telehealth: Payer: Self-pay | Admitting: Internal Medicine

## 2009-03-21 ENCOUNTER — Encounter: Payer: Self-pay | Admitting: Internal Medicine

## 2009-03-24 ENCOUNTER — Ambulatory Visit (HOSPITAL_COMMUNITY): Admission: RE | Admit: 2009-03-24 | Discharge: 2009-03-24 | Payer: Self-pay | Admitting: Internal Medicine

## 2009-03-24 LAB — COMPREHENSIVE METABOLIC PANEL
ALT: 16 U/L (ref 0–35)
CO2: 27 mEq/L (ref 19–32)
Calcium: 8.9 mg/dL (ref 8.4–10.5)
Chloride: 100 mEq/L (ref 96–112)
Sodium: 135 mEq/L (ref 135–145)
Total Protein: 7.9 g/dL (ref 6.0–8.3)

## 2009-03-24 LAB — CBC WITH DIFFERENTIAL/PLATELET
BASO%: 0.4 % (ref 0.0–2.0)
Eosinophils Absolute: 0.1 10*3/uL (ref 0.0–0.5)
HCT: 31.7 % — ABNORMAL LOW (ref 34.8–46.6)
MCHC: 33.6 g/dL (ref 31.5–36.0)
MONO#: 0.5 10*3/uL (ref 0.1–0.9)
NEUT#: 3.4 10*3/uL (ref 1.5–6.5)
NEUT%: 60.1 % (ref 38.4–76.8)
WBC: 5.6 10*3/uL (ref 3.9–10.3)
lymph#: 1.6 10*3/uL (ref 0.9–3.3)

## 2009-03-26 ENCOUNTER — Encounter: Payer: Self-pay | Admitting: Internal Medicine

## 2009-03-27 ENCOUNTER — Telehealth (INDEPENDENT_AMBULATORY_CARE_PROVIDER_SITE_OTHER): Payer: Self-pay | Admitting: *Deleted

## 2009-04-01 ENCOUNTER — Ambulatory Visit: Payer: Self-pay | Admitting: Pulmonary Disease

## 2009-04-02 ENCOUNTER — Ambulatory Visit: Payer: Self-pay | Admitting: Thoracic Surgery

## 2009-04-03 ENCOUNTER — Encounter: Payer: Self-pay | Admitting: Internal Medicine

## 2009-04-07 ENCOUNTER — Telehealth: Payer: Self-pay | Admitting: Internal Medicine

## 2009-04-18 ENCOUNTER — Ambulatory Visit: Payer: Self-pay | Admitting: Internal Medicine

## 2009-04-18 DIAGNOSIS — R5381 Other malaise: Secondary | ICD-10-CM | POA: Insufficient documentation

## 2009-04-18 DIAGNOSIS — M722 Plantar fascial fibromatosis: Secondary | ICD-10-CM

## 2009-04-18 DIAGNOSIS — M76899 Other specified enthesopathies of unspecified lower limb, excluding foot: Secondary | ICD-10-CM

## 2009-04-18 DIAGNOSIS — R5383 Other fatigue: Secondary | ICD-10-CM

## 2009-04-18 HISTORY — DX: Other malaise: R53.81

## 2009-04-18 HISTORY — DX: Other specified enthesopathies of unspecified lower limb, excluding foot: M76.899

## 2009-04-18 LAB — CONVERTED CEMR LAB
ALT: 14 units/L (ref 0–35)
Albumin: 3.8 g/dL (ref 3.5–5.2)
Basophils Absolute: 0.3 10*3/uL — ABNORMAL HIGH (ref 0.0–0.1)
Basophils Relative: 5.2 % — ABNORMAL HIGH (ref 0.0–3.0)
Bilirubin Urine: NEGATIVE
CO2: 29 meq/L (ref 19–32)
Calcium: 9.2 mg/dL (ref 8.4–10.5)
Creatinine, Ser: 1.6 mg/dL — ABNORMAL HIGH (ref 0.4–1.2)
Glucose, Bld: 89 mg/dL (ref 70–99)
Hemoglobin, Urine: NEGATIVE
Hemoglobin: 10 g/dL — ABNORMAL LOW (ref 12.0–15.0)
Lymphocytes Relative: 19.2 % (ref 12.0–46.0)
Monocytes Relative: 8.6 % (ref 3.0–12.0)
Neutro Abs: 4.1 10*3/uL (ref 1.4–7.7)
Neutrophils Relative %: 65.3 % (ref 43.0–77.0)
RBC: 3 M/uL — ABNORMAL LOW (ref 3.87–5.11)
RDW: 14.8 % — ABNORMAL HIGH (ref 11.5–14.6)
Saturation Ratios: 12.9 % — ABNORMAL LOW (ref 20.0–50.0)
TSH: 2.15 microintl units/mL (ref 0.35–5.50)
Total Protein, Urine: NEGATIVE mg/dL
Total Protein: 8 g/dL (ref 6.0–8.3)
Transferrin: 177.3 mg/dL — ABNORMAL LOW (ref 212.0–360.0)
Urine Glucose: NEGATIVE mg/dL
Urobilinogen, UA: 0.2 (ref 0.0–1.0)
Vitamin B-12: 1071 pg/mL — ABNORMAL HIGH (ref 211–911)

## 2009-04-23 ENCOUNTER — Encounter: Payer: Self-pay | Admitting: Internal Medicine

## 2009-04-24 ENCOUNTER — Encounter: Payer: Self-pay | Admitting: Internal Medicine

## 2009-04-24 ENCOUNTER — Ambulatory Visit: Payer: Self-pay

## 2009-04-28 ENCOUNTER — Encounter: Admission: RE | Admit: 2009-04-28 | Discharge: 2009-04-28 | Payer: Self-pay | Admitting: Internal Medicine

## 2009-04-28 ENCOUNTER — Ambulatory Visit: Payer: Self-pay | Admitting: Internal Medicine

## 2009-04-28 DIAGNOSIS — F329 Major depressive disorder, single episode, unspecified: Secondary | ICD-10-CM

## 2009-04-28 DIAGNOSIS — F3289 Other specified depressive episodes: Secondary | ICD-10-CM

## 2009-04-28 HISTORY — DX: Major depressive disorder, single episode, unspecified: F32.9

## 2009-04-28 HISTORY — DX: Other specified depressive episodes: F32.89

## 2009-04-28 LAB — HM MAMMOGRAPHY: HM Mammogram: NEGATIVE

## 2009-05-05 ENCOUNTER — Telehealth: Payer: Self-pay | Admitting: Internal Medicine

## 2009-05-05 DIAGNOSIS — R040 Epistaxis: Secondary | ICD-10-CM | POA: Insufficient documentation

## 2009-05-06 ENCOUNTER — Encounter: Payer: Self-pay | Admitting: Internal Medicine

## 2009-05-17 ENCOUNTER — Encounter: Payer: Self-pay | Admitting: Internal Medicine

## 2009-06-04 ENCOUNTER — Ambulatory Visit: Payer: Self-pay | Admitting: Internal Medicine

## 2009-06-04 ENCOUNTER — Telehealth: Payer: Self-pay | Admitting: Internal Medicine

## 2009-06-04 LAB — CONVERTED CEMR LAB
ALT: 16 units/L (ref 0–35)
AST: 11 units/L (ref 0–37)
Albumin: 3.8 g/dL (ref 3.5–5.2)
Alkaline Phosphatase: 52 units/L (ref 39–117)
Basophils Absolute: 0.1 10*3/uL (ref 0.0–0.1)
Bilirubin Urine: NEGATIVE
Calcium: 8.8 mg/dL (ref 8.4–10.5)
Eosinophils Relative: 3.1 % (ref 0.0–5.0)
GFR calc non Af Amer: 31.78 mL/min (ref >60)
Glucose, Bld: 87 mg/dL (ref 70–99)
HCT: 29.2 % — ABNORMAL LOW (ref 36.0–46.0)
HDL: 39.6 mg/dL (ref >39.00)
Hemoglobin: 9.8 g/dL — ABNORMAL LOW (ref 12.0–15.0)
Ketones, ur: NEGATIVE mg/dL
LDL Cholesterol: 68 mg/dL (ref 0–99)
Lymphocytes Relative: 24.9 % (ref 12.0–46.0)
Lymphs Abs: 1.4 10*3/uL (ref 0.7–4.0)
Monocytes Relative: 10.1 % (ref 3.0–12.0)
Neutro Abs: 3.3 10*3/uL (ref 1.4–7.7)
Potassium: 4.6 meq/L (ref 3.5–5.1)
RBC: 2.96 M/uL — ABNORMAL LOW (ref 3.87–5.11)
RDW: 14.5 % (ref 11.5–14.6)
Sodium: 139 meq/L (ref 135–145)
Specific Gravity, Urine: 1.015 (ref 1.000–1.030)
TSH: 1.34 microintl units/mL (ref 0.35–5.50)
Total CHOL/HDL Ratio: 3
Urine Glucose: NEGATIVE mg/dL
Urobilinogen, UA: 0.2 (ref 0.0–1.0)
VLDL: 20 mg/dL (ref 0.0–40.0)
WBC: 5.5 10*3/uL (ref 4.5–10.5)
pH: 5.5 (ref 5.0–8.0)

## 2009-06-09 ENCOUNTER — Ambulatory Visit: Payer: Self-pay | Admitting: Internal Medicine

## 2009-06-30 ENCOUNTER — Ambulatory Visit: Payer: Self-pay | Admitting: Pulmonary Disease

## 2009-07-30 ENCOUNTER — Encounter: Payer: Self-pay | Admitting: Internal Medicine

## 2009-09-15 ENCOUNTER — Telehealth: Payer: Self-pay | Admitting: Internal Medicine

## 2009-09-16 ENCOUNTER — Ambulatory Visit (HOSPITAL_BASED_OUTPATIENT_CLINIC_OR_DEPARTMENT_OTHER): Payer: PRIVATE HEALTH INSURANCE | Admitting: Internal Medicine

## 2009-09-18 ENCOUNTER — Encounter: Payer: Self-pay | Admitting: Internal Medicine

## 2009-09-18 ENCOUNTER — Ambulatory Visit (HOSPITAL_COMMUNITY): Admission: RE | Admit: 2009-09-18 | Discharge: 2009-09-18 | Payer: Self-pay | Admitting: Internal Medicine

## 2009-09-18 LAB — COMPREHENSIVE METABOLIC PANEL
ALT: 16 U/L (ref 0–35)
BUN: 37 mg/dL — ABNORMAL HIGH (ref 6–23)
CO2: 24 mEq/L (ref 19–32)
Calcium: 9 mg/dL (ref 8.4–10.5)
Chloride: 106 mEq/L (ref 96–112)
Creatinine, Ser: 1.56 mg/dL — ABNORMAL HIGH (ref 0.40–1.20)
Glucose, Bld: 91 mg/dL (ref 70–99)

## 2009-09-18 LAB — CBC WITH DIFFERENTIAL/PLATELET
BASO%: 0.9 % (ref 0.0–2.0)
Basophils Absolute: 0 10*3/uL (ref 0.0–0.1)
Eosinophils Absolute: 0.2 10*3/uL (ref 0.0–0.5)
HCT: 33.3 % — ABNORMAL LOW (ref 34.8–46.6)
HGB: 11.3 g/dL — ABNORMAL LOW (ref 11.6–15.9)
LYMPH%: 24.3 % (ref 14.0–49.7)
MONO#: 0.5 10*3/uL (ref 0.1–0.9)
NEUT#: 2.8 10*3/uL (ref 1.5–6.5)
NEUT%: 59.7 % (ref 38.4–76.8)
Platelets: 267 10*3/uL (ref 145–400)
WBC: 4.6 10*3/uL (ref 3.9–10.3)
lymph#: 1.1 10*3/uL (ref 0.9–3.3)

## 2009-09-23 ENCOUNTER — Encounter: Payer: Self-pay | Admitting: Internal Medicine

## 2009-09-26 ENCOUNTER — Telehealth (INDEPENDENT_AMBULATORY_CARE_PROVIDER_SITE_OTHER): Payer: Self-pay | Admitting: *Deleted

## 2009-10-21 ENCOUNTER — Ambulatory Visit: Payer: Self-pay | Admitting: Internal Medicine

## 2009-10-21 LAB — CONVERTED CEMR LAB
BUN: 29 mg/dL — ABNORMAL HIGH (ref 6–23)
Basophils Absolute: 0 10*3/uL (ref 0.0–0.1)
Bilirubin Urine: NEGATIVE
Bilirubin, Direct: 0.1 mg/dL (ref 0.0–0.3)
Chloride: 102 meq/L (ref 96–112)
Cholesterol: 176 mg/dL (ref 0–200)
Creatinine, Ser: 1.6 mg/dL — ABNORMAL HIGH (ref 0.4–1.2)
Eosinophils Absolute: 0.2 10*3/uL (ref 0.0–0.7)
Glucose, Bld: 76 mg/dL (ref 70–99)
HCT: 32.3 % — ABNORMAL LOW (ref 36.0–46.0)
HDL: 37.2 mg/dL — ABNORMAL LOW (ref >39.00)
Hemoglobin, Urine: NEGATIVE
Ketones, ur: NEGATIVE mg/dL
Lymphs Abs: 1.1 10*3/uL (ref 0.7–4.0)
MCV: 97.5 fL (ref 78.0–100.0)
Monocytes Absolute: 0.5 10*3/uL (ref 0.1–1.0)
Neutrophils Relative %: 60.5 % (ref 43.0–77.0)
Platelets: 207 10*3/uL (ref 150.0–400.0)
Potassium: 4.7 meq/L (ref 3.5–5.1)
RDW: 14.6 % (ref 11.5–14.6)
TSH: 1.57 microintl units/mL (ref 0.35–5.50)
Total Bilirubin: 0.3 mg/dL (ref 0.3–1.2)
Total Protein, Urine: NEGATIVE mg/dL
Triglycerides: 115 mg/dL (ref 0.0–149.0)
Urine Glucose: NEGATIVE mg/dL
VLDL: 23 mg/dL (ref 0.0–40.0)

## 2009-10-23 ENCOUNTER — Ambulatory Visit: Payer: Self-pay | Admitting: Internal Medicine

## 2009-11-05 ENCOUNTER — Telehealth: Payer: Self-pay | Admitting: Internal Medicine

## 2009-11-17 ENCOUNTER — Telehealth: Payer: Self-pay | Admitting: Internal Medicine

## 2009-11-18 ENCOUNTER — Encounter: Payer: Self-pay | Admitting: Cardiovascular Disease

## 2009-11-18 ENCOUNTER — Ambulatory Visit (HOSPITAL_COMMUNITY)
Admission: RE | Admit: 2009-11-18 | Discharge: 2009-11-18 | Payer: Self-pay | Source: Home / Self Care | Admitting: Cardiovascular Disease

## 2009-11-18 ENCOUNTER — Ambulatory Visit: Payer: Self-pay | Admitting: Cardiovascular Disease

## 2009-11-18 ENCOUNTER — Ambulatory Visit: Payer: Self-pay

## 2009-11-18 ENCOUNTER — Encounter (INDEPENDENT_AMBULATORY_CARE_PROVIDER_SITE_OTHER): Payer: Self-pay | Admitting: *Deleted

## 2009-12-02 ENCOUNTER — Telehealth: Payer: Self-pay | Admitting: Internal Medicine

## 2009-12-03 ENCOUNTER — Ambulatory Visit: Payer: Self-pay | Admitting: Internal Medicine

## 2009-12-15 ENCOUNTER — Telehealth: Payer: Self-pay | Admitting: Internal Medicine

## 2010-01-06 ENCOUNTER — Encounter: Payer: Self-pay | Admitting: Internal Medicine

## 2010-01-12 ENCOUNTER — Ambulatory Visit: Payer: Self-pay | Admitting: Pulmonary Disease

## 2010-01-14 ENCOUNTER — Encounter: Payer: Self-pay | Admitting: Internal Medicine

## 2010-01-16 ENCOUNTER — Telehealth (INDEPENDENT_AMBULATORY_CARE_PROVIDER_SITE_OTHER): Payer: Self-pay | Admitting: *Deleted

## 2010-02-11 ENCOUNTER — Telehealth: Payer: Self-pay | Admitting: Internal Medicine

## 2010-02-23 ENCOUNTER — Ambulatory Visit
Admission: RE | Admit: 2010-02-23 | Discharge: 2010-02-23 | Payer: Self-pay | Source: Home / Self Care | Attending: Emergency Medicine | Admitting: Emergency Medicine

## 2010-02-23 ENCOUNTER — Telehealth (INDEPENDENT_AMBULATORY_CARE_PROVIDER_SITE_OTHER): Payer: Self-pay | Admitting: *Deleted

## 2010-02-23 DIAGNOSIS — J209 Acute bronchitis, unspecified: Secondary | ICD-10-CM | POA: Insufficient documentation

## 2010-02-23 HISTORY — DX: Acute bronchitis, unspecified: J20.9

## 2010-02-24 ENCOUNTER — Telehealth: Payer: Self-pay | Admitting: Internal Medicine

## 2010-03-05 ENCOUNTER — Other Ambulatory Visit: Payer: Self-pay | Admitting: Internal Medicine

## 2010-03-05 DIAGNOSIS — C3490 Malignant neoplasm of unspecified part of unspecified bronchus or lung: Secondary | ICD-10-CM

## 2010-03-08 ENCOUNTER — Encounter: Payer: Self-pay | Admitting: Internal Medicine

## 2010-03-09 ENCOUNTER — Encounter: Payer: Self-pay | Admitting: Internal Medicine

## 2010-03-17 NOTE — Miscellaneous (Signed)
Summary: Orders Update  Clinical Lists Changes  Orders: Added new Test order of Arterial Duplex Lower Extremity (Arterial Duplex Low) - Signed 

## 2010-03-17 NOTE — Assessment & Plan Note (Signed)
Summary: left side soreness after surgery...as.    History of Present Illness Visit Type: Follow-up Visit Primary GI MD: Melvia Heaps MD [facility] Primary Provider: [name], MD Chief Complaint: Hoarseness [name]  has returned for followup of her Barrett's esophagus and esophageal stricture.  In February, 2009 a distal esophageal stricture was dilated.  Barrett's esophagus was noted on biopsy.  She remains on omeprazole.  She does complain of dysphagia to pills only that requires water to flush it down.  She denies pyrosis.  Followup endoscopy was recommended for early 2010 but this was postponed because of a diagnosis of lung cancer for which she underwent resection.  She was staged at 1A.   GI Review of Systems    Reports acid reflux, belching, bloating, dysphagia with liquids, and  dysphagia with solids.      Denies abdominal pain, chest pain, heartburn, loss of appetite, nausea, vomiting, vomiting blood, weight loss, and  weight gain.      Reports constipation, diarrhea, hemorrhoids, and  irritable bowel syndrome.     Denies anal fissure, black tarry stools, change in bowel habit, diverticulosis, fecal incontinence, heme positive stool, jaundice, light color stool, liver problems, rectal bleeding, and  rectal pain.    Current Medications (verified): 1)  Pravachol 40 Mg Tabs (Pravastatin Sodium) .... Take 1 Tablet By Mouth Once A Day 2)  Omeprazole 20 Mg  Cpdr (Omeprazole) .... 2 By Mouth Qd 3)  Fosamax 70 Mg  Tabs (Alendronate Sodium) .... Take 1 Tablet By Mouth Once A Week 4)  Bentyl 20 Mg  Tabs (Dicyclomine Hcl) .Marland Kitchen.. 1 By Mouth Bid 5)  Fluticasone Propionate 0.05 %  Crea (Fluticasone Propionate) .... As Needed 6)  Benazepril-Hydrochlorothiazide 10-12.5 Mg Tabs (Benazepril-Hydrochlorothiazide) .Marland Kitchen.. 1 By Mouth Q Am 7)  Vitamin D3 1000 Unit  Tabs (Cholecalciferol) .Marland Kitchen.. 1 By Mouth Daily 8)  Cetirizine Hcl 10 Mg Tabs (Cetirizine Hcl) .Marland Kitchen.. 1 By Mouth Once Daily As Needed 9)  Zolpidem  Tartrate 5 Mg Tabs (Zolpidem Tartrate) .Marland Kitchen.. 1 By Mouth At Bedtime As Needed Sleep 10)  Alprazolam 0.25 Mg Tabs (Alprazolam) .Marland Kitchen.. 1 By Mouth Two Times A Day As Needed 11)  Flonase 50 Mcg/act  Susp (Fluticasone Propionate) .... Two Puffs Each Nostril Daily 12)  Spiriva Handihaler 18 Mcg Caps (Tiotropium Bromide Monohydrate) .... Take One Capsule Daily With Handihaler 13)  Tramadol Hcl 50 Mg Tabs (Tramadol Hcl) .Marland Kitchen.. 1 - 2 By Mouth Q 6 Hrs As Needed 14)  Ferrex 150 150 Mg Caps (Polysaccharide Iron Complex) .Marland Kitchen.. 1 By Mouth Once Daily 15)  Ciprofloxacin Hcl 500 Mg Tabs (Ciprofloxacin Hcl) .Marland Kitchen.. 1 By Mouth Two Times A Day  Allergies (verified): 1)  ! Pcn 2)  ! Amoxicillin 3)  ! Codeine 4)  ! 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 Left upper lobe lesion obstructing the left upper lobe   orifice, probable malignancy until proven otherwise.   Tumor removed from lung 08/06/08  Family History: Reviewed history from 07/01/2008 and no changes required. Family History High cholesterol Family History Hypertension colon cancer Mother-deceased ? cause when patient was 87 years old Father- deceased ? cause, natural causes, age Several nephews with CAD  Social History: Reviewed history  from 09/18/2008 and no changes required. 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    Vital Signs:  Patient profile:   70 year old female Height:      62 inches Weight:      169.50 pounds BMI:     31.11 Pulse rate:   72 / minute Pulse rhythm:   regular BP sitting:   110 / 58  (left arm) Cuff size:   regular  Vitals Entered By: June McMurray CMA Duncan Dull) (November 14, 2008 11:15 AM)   Impression & Recommendations:  Problem # 1:  STRICTURE AND STENOSIS OF ESOPHAGUS (ICD-530.3) Assessment Deteriorated Plan  upper endoscopy with dilatation as indicated.  Problem # 2:  BARRETTS ESOPHAGUS (ICD-530.85) Plan followup endoscopy Orders: EGD (EGD)  Problem # 3:  CARCINOMA, LUNG, SQUAMOUS CELL (ICD-162.9) Assessment: Comment Only  Patient Instructions: 1)  Conscious Sedation brochure given.  2)  Upper Endoscopy brochure given.  3)  Upper Endoscopy with Dilatation brochure given.  4)  Your procedure is scheduled for 11/15/2008 at 11AM 5)  The medication list was reviewed and reconciled.  All changed / newly prescribed medications were explained.  A complete medication list was provided to the patient / caregiver.

## 2010-03-17 NOTE — Progress Notes (Signed)
Summary: Med Refill  Phone Note Refill Request  on June 04, 2009 1:44 PM  Refills Requested: Medication #1:  ZOLPIDEM TARTRATE 5 MG TABS 1 by mouth at bedtime as needed sleep   Dosage confirmed as above?Dosage Confirmed   Notes: [facility] [address] [phone] Initial call taken by: Scharlene Gloss,  June 04, 2009 1:45 PM  Follow-up for Phone Call        RX faxed to pharmacy Follow-up by: Margaret Pyle, CMA,  June 04, 2009 1:52 PM    New/Updated Medications: ZOLPIDEM TARTRATE 5 MG TABS (ZOLPIDEM TARTRATE) 1 by mouth at bedtime as needed sleep Prescriptions: ZOLPIDEM TARTRATE 5 MG TABS (ZOLPIDEM TARTRATE) 1 by mouth at bedtime as needed sleep  #30 x 5   Entered and Authorized by:   Corwin Levins MD   Signed by:   Corwin Levins MD on 06/04/2009   Method used:   Print then Give to Patient   RxID:   [id number]  done hardcopy to LIM side B - dahlia Corwin Levins MD  June 04, 2009 1:48 PM

## 2010-03-17 NOTE — Assessment & Plan Note (Signed)
Summary: ER FU / LEGS WERE SWOLLEN/NWS   Vital Signs:  Patient profile:   70 year old female Height:      62 inches Weight:      157 pounds BMI:     28.82 O2 Sat:      98 % on Room air Temp:     99.5 degrees F oral Pulse rate:   102 / minute BP sitting:   182 / 80  (left arm) Cuff size:   regular  Vitals Entered ByMarland Kitchen Zella Ball Ewing (February 17, 2009 2:22 PM)  O2 Flow:  Room air CC: ER followup, discuss meds/RE   Primary Care Provider:  Corwin Levins MD  CC:  ER followup and discuss meds/RE.  History of Present Illness: BP in nov 2010 low norma per pt with some mild weakness and fatigue since per pt;  seen in ER dec 30 with periph edema and cellulitis  - tx with furosemide and cephalexin course with pain, swelling , redness to lE's resolved bilat;  no fever , chills , and weakness improved;  already on the hctz with the benazepril so needs to be stopped;  missed her ortho appt due to recent bad weather and not sure which MD so not able to re-schedule she says;  Pt denies CP, sob, doe, wheezing, orthopnea, pnd, worsening LE edema, palps, dizziness or syncope   Pt denies new neuro symptoms such as headache, facial or extremity weakness . BP at home has been low normal as above, states aggrevated today so BP must be up due to this.  Has low grade temp but denies ST, chills, cough or GU changes.  Still with ongoing neck pain as per last visit - no change .  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) 5)  Neck Pain, Acute  (ICD-723.1) 6)  Aortic Stenosis/ Insufficiency, Non-rheumatic  (ICD-424.1) 7)  Stricture and Stenosis of Esophagus  (ICD-530.3) 8)  Barretts Esophagus  (ICD-530.85) 9)  Pruritus  (ICD-698.9) 10)  Uti  (ICD-599.0) 11)  Anemia-iron Deficiency  (ICD-280.9) 12)  Pre-operative Cardiac Exam  (ICD-V72.81) 13)  Pre-operative  Cardiovascular Examination  (ICD-V72.81) 14)  C O P D  (ICD-496) 15)  Carcinoma, Lung, Squamous Cell  (ICD-162.9) 16)  Anxiety  (ICD-300.00) 17)  Insomnia-sleep Disorder-unspec  (ICD-780.52) 18)  Abnormal Chest Xray  (ICD-793.1) 19)  Pneumonia  (ICD-486) 20)  Sinusitis- Acute-nos  (ICD-461.9) 21)  Preventive Health Care  (ICD-V70.0) 22)  Anemia of Other Chronic Disease  (ICD-285.29) 23)  Hyperkalemia  (ICD-276.7) 24)  Renal Insufficiency  (ICD-588.9) 25)  Sinusitis, Chronic  (ICD-473.9) 26)  Frequency, Urinary  (ICD-788.41) 27)  Sinusitis- Acute-nos  (ICD-461.9) 28)  Pneumonia  (ICD-486) 29)  Foot Pain, Right  (ICD-729.5) 30)  Preventive Health Care  (ICD-V70.0) 31)  Dyspepsia  (ICD-536.8) 32)  Dysphagia Unspecified  (ICD-787.20) 33)  Abdominal Pain, Generalized  (ICD-789.07) 34)  Uti  (ICD-599.0) 35)  Osteoarthritis, Knee, Left  (ICD-715.96) 36)  Positive Ppd  (ICD-795.5) 37)  Pericarditis  (ICD-423.9) 38)  Gerd  (ICD-530.81) 39)  Osteoporosis  (ICD-733.00) 40)  Allergic Rhinitis  (ICD-477.9) 41)  Positive Tb Skin Test, 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 2 By Mouth Qd 3)  Fosamax 70 Mg  Tabs (Alendronate Sodium) .... Take 1 Tablet By Mouth Once A Week 4)  Bentyl 20 Mg  Tabs (Dicyclomine Hcl) .Marland Kitchen.. 1 By Mouth Bid 5)  Fluticasone Propionate 0.05 %  Crea (Fluticasone Propionate) .... As Needed 6)  Vitamin D3 1000 Unit  Tabs (Cholecalciferol) .Marland Kitchen.. 1 By Mouth Daily 7)  Cetirizine Hcl 10 Mg Tabs (Cetirizine Hcl) .Marland Kitchen.. 1 By Mouth Once Daily As Needed 8)  Zolpidem Tartrate 5 Mg Tabs (Zolpidem Tartrate) .Marland Kitchen.. 1 By Mouth At Bedtime As Needed Sleep 9)  Alprazolam 0.25 Mg Tabs (Alprazolam) .Marland Kitchen.. 1 By Mouth Two  Times A Day As Needed 10)  Flonase 50 Mcg/act  Susp (Fluticasone Propionate) .... Two Puffs Each Nostril Daily 11)  Spiriva Handihaler 18 Mcg Caps (Tiotropium Bromide Monohydrate) .... Take One Capsule Daily With Handihaler 12)  Hydrocodone-Acetaminophen 5-325 Mg Tabs (Hydrocodone-Acetaminophen) .Marland Kitchen.. 1 - 2 By Mouth Q 6 Hrs As Needed For Pain 13)  Ferrex 150 150 Mg Caps (Polysaccharide Iron Complex) .Marland Kitchen.. 1 By Mouth Once Daily 14)  Flexeril 5 Mg Tabs (Cyclobenzaprine Hcl) .Marland Kitchen.. 1po Three Times A Day As Needed 15)  Septra Ds 800-160 Mg Tabs (Sulfamethoxazole-Trimethoprim) .Marland Kitchen.. 1po Two Times A Day 16)  Benazepril Hcl 10 Mg Tabs (Benazepril Hcl) .Marland Kitchen.. 1 By Mouth Once Daily 17)  Furosemide 40 Mg Tabs (Furosemide) .... 1/2 - 1 By Mouth Once Daily  Current Medications (verified): 1)  Pravachol 40 Mg Tabs (Pravastatin Sodium) .... Take 1 Tablet By Mouth Once A Day 2)  Omeprazole 20 Mg  Cpdr (Omeprazole) .... 2 By Mouth Qd 3)  Fosamax 70 Mg  Tabs (Alendronate Sodium) .... Take 1 Tablet By Mouth Once A Week 4)  Bentyl 20 Mg  Tabs (Dicyclomine Hcl) .Marland Kitchen.. 1 By Mouth Bid 5)  Fluticasone Propionate 0.05 %  Crea (Fluticasone Propionate) .... As Needed 6)  Vitamin D3 1000 Unit  Tabs (Cholecalciferol) .Marland Kitchen.. 1 By Mouth Daily 7)  Cetirizine Hcl 10 Mg Tabs (Cetirizine Hcl) .Marland Kitchen.. 1 By Mouth Once Daily As Needed 8)  Zolpidem Tartrate 5 Mg Tabs (Zolpidem Tartrate) .Marland Kitchen.. 1 By Mouth At Bedtime As Needed Sleep 9)  Alprazolam 0.25 Mg Tabs (Alprazolam) .Marland Kitchen.. 1 By Mouth Two Times A Day As Needed 10)  Flonase 50 Mcg/act  Susp (Fluticasone Propionate) .... Two Puffs Each Nostril Daily 11)  Spiriva Handihaler 18 Mcg Caps (Tiotropium Bromide Monohydrate) .... Take One Capsule Daily With Handihaler 12)  Hydrocodone-Acetaminophen 5-325 Mg Tabs (Hydrocodone-Acetaminophen) .Marland Kitchen.. 1 - 2 By Mouth Q 6 Hrs As Needed For Pain 13)  Ferrex 150 150 Mg Caps (Polysaccharide Iron Complex) .Marland Kitchen.. 1 By Mouth Once Daily 14)  Flexeril 5 Mg Tabs  (Cyclobenzaprine Hcl) .Marland Kitchen.. 1po Three Times A Day As Needed 15)  Benazepril Hcl 10 Mg Tabs (Benazepril Hcl) .Marland Kitchen.. 1 By Mouth Once Daily 16)  Furosemide 40 Mg Tabs (Furosemide) .... 1/2 - 1 By Mouth Once Daily  Allergies (verified): 1)  ! Pcn 2)  ! Amoxicillin 3)  ! Codeine 4)  ! 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 Left upper lobe lesion obstructing the left upper lobe   orifice, probable malignancy until proven otherwise.   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   Head:  normocephalic and atraumatic.   Eyes:  vision grossly intact, pupils equal, and pupils round.   Ears:  R ear normal and L ear normal.   Nose:  no external deformity and no nasal discharge.   Mouth:  no gingival abnormalities and pharynx pink and moist.   Neck:  supple and no masses.   Lungs:  normal respiratory effort and normal breath sounds.   Heart:  normal rate and regular rhythm.   Abdomen:  soft, non-tender, and normal bowel sounds.   Msk:  no joint tenderness  and no joint swelling.   Extremities:  no edema, no erythema and no ulcers Neurologic:  alert & oriented X3 and cranial nerves II-XII intact.  o/w gross motor intact and not done in detail   Impression & Recommendations:  Problem # 1:  SPONDYLOSIS, CERVICAL, WITH RADICULOPATHY (ICD-723.4) will need to re-schedule with ortho - i will ask pt to see our [facility]  Problem # 2:  CELLULITIS, LEG, LEFT (ICD-682.6)  The following medications were removed from the medication list:    Septra Ds 800-160 Mg Tabs (Sulfamethoxazole-trimethoprim) .Marland Kitchen... 1po two times a day resolved, ok for now further antibx at this time  Problem # 3:  PERIPHERAL EDEMA (ICD-782.3)  Her updated medication list for this problem includes:    Furosemide 40 Mg Tabs (Furosemide) .Marland Kitchen... 1/2 - 1 by mouth once daily resolved, to cont meds as is  Problem # 4:  ANEMIA-IRON DEFICIENCY (ICD-280.9)  Her updated medication list for this problem includes:    Ferrex 150 150 Mg Caps (Polysaccharide iron complex) .Marland Kitchen... 1 by mouth once daily to cont meds, f/u next vist  Problem # 5:  HYPERTENSION (ICD-401.9)  Her updated medication list for this problem includes:    Benazepril Hcl 10 Mg Tabs (Benazepril hcl) .Marland Kitchen... 1 by mouth once daily    Furosemide 40 Mg Tabs (Furosemide) .Marland Kitchen... 1/2 - 1 by mouth once daily usually good control, will re-check next visit, Continue all previous medications as before this visit   Complete Medication List: 1)  Pravachol 40 Mg Tabs (Pravastatin sodium) .... Take 1 tablet by mouth once a day 2)  Omeprazole 20 Mg Cpdr (Omeprazole) .... 2 by mouth qd 3)  Fosamax 70 Mg Tabs (Alendronate sodium) .... Take 1 tablet by mouth once a week 4)  Bentyl 20 Mg Tabs (Dicyclomine hcl) .Marland Kitchen.. 1 by mouth bid 5)  Fluticasone Propionate 0.05 % Crea (Fluticasone propionate) .... As needed 6)  Vitamin D3 1000 Unit Tabs (Cholecalciferol) .Marland Kitchen.. 1 by mouth daily 7)  Cetirizine Hcl 10 Mg Tabs (Cetirizine hcl) .Marland Kitchen.. 1 by mouth once  daily as needed 8)  Zolpidem Tartrate 5 Mg Tabs (Zolpidem tartrate) .Marland Kitchen.. 1 by mouth at bedtime as needed sleep 9)  Alprazolam 0.25 Mg Tabs (Alprazolam) .Marland Kitchen.. 1 by mouth two times a  day as needed 10)  Flonase 50 Mcg/act Susp (Fluticasone propionate) .... Two puffs each nostril daily 11)  Spiriva Handihaler 18 Mcg Caps (Tiotropium bromide monohydrate) .... Take one capsule daily with handihaler 12)  Hydrocodone-acetaminophen 5-325 Mg Tabs (Hydrocodone-acetaminophen) .Marland Kitchen.. 1 - 2 by mouth q 6 hrs as needed for pain 13)  Ferrex 150 150 Mg Caps (Polysaccharide iron complex) .Marland Kitchen.. 1 by mouth once daily 14)  Flexeril 5 Mg Tabs (Cyclobenzaprine hcl) .Marland Kitchen.. 1po three times a day as needed 15)  Benazepril Hcl 10 Mg Tabs (Benazepril hcl) .Marland Kitchen.. 1 by mouth once daily 16)  Furosemide 40 Mg Tabs (Furosemide) .... 1/2 - 1 by mouth once daily  Patient Instructions: 1)  please see our [facility] regarding re-scheduling with the orthopedic for the neck pain 2)  Continue all previous medications as before this visit  3)  Please schedule a follow-up appointment in 2 months to re-check the blood pressure and anemia 4)  Check your Blood Pressure regularly. If it is above 140/90: you should make an appointment sooner

## 2010-03-17 NOTE — Medication Information (Signed)
Summary: [facility]  [facility]   Imported By: Sherian Rein 04/09/2009 08:30:08  _____________________________________________________________________  External Attachment:    Type:   Image     Comment:   External Document

## 2010-03-17 NOTE — Progress Notes (Signed)
  Phone Note Refill Request Message from:  Fax from Pharmacy on December 15, 2009 2:02 PM  Refills Requested: Medication #1:  PRAVACHOL 40 MG TABS Take 1 tablet by mouth once a day   Dosage confirmed as above?Dosage Confirmed   Notes: [facility]  Medication #2:  FUROSEMIDE 40 MG TABS 1/2 - 1 by mouth once daily   Dosage confirmed as above?Dosage Confirmed   Notes: [facility] Initial call taken by: Robin Ewing CMA Duncan Dull),  December 15, 2009 2:02 PM    Prescriptions: FUROSEMIDE 40 MG TABS (FUROSEMIDE) 1/2 - 1 by mouth once daily  #90 x 3   Entered by:   Zella Ball Ewing CMA (AAMA)   Authorized by:   Corwin Levins MD   Signed by:   Scharlene Gloss CMA (AAMA) on 12/15/2009   Method used:   Faxed to ...       [facility] (retail)       [address]       [place], [place]  [address]  [place]       Ph: [phone]       Fax: [phone]   RxID:   [id number] PRAVACHOL 40 MG TABS (PRAVASTATIN SODIUM) Take 1 tablet by mouth once a day  #90 x 3   Entered by:   Scharlene Gloss CMA (AAMA)   Authorized by:   Corwin Levins MD   Signed by:   Scharlene Gloss CMA (AAMA) on 12/15/2009   Method used:   Faxed to ...       [facility] (retail)       [address]       [place], [place]  [address]  [place]       Ph: [phone]       Fax: [phone]   RxID:   [id number]

## 2010-03-17 NOTE — Miscellaneous (Signed)
Summary: Orders Update pft charges  Clinical Lists Changes  Orders: Added new Service order of Carbon Monoxide diffusing w/capacity (94720) - Signed Added new Service order of Lung Volumes (94240) - Signed Added new Service order of Spirometry (Pre & Post) (94060) - Signed 

## 2010-03-17 NOTE — Progress Notes (Signed)
Summary: medication  Phone Note Call from Patient   Caller: Patient Call For: wright Summary of Call: pt can't afford spiriva would like something less expensive pharmacy [facility] [address] Initial call taken by: Rickard Patience,  March 27, 2009 2:02 PM  Follow-up for Phone Call        Dr Vassie Loll, this pt saw you last at [facility] May 2010.  You started her on spiriva and now she can not afford.  Please advise recs thanks Vernie Murders  March 27, 2009 2:22 PM   Additional Follow-up for Phone Call Additional follow up Details #1::        needs OV Additional Follow-up by: Comer Locket. Vassie Loll MD,  March 27, 2009 3:44 PM    Additional Follow-up for Phone Call Additional follow up Details #2::    OV with RA sched for 2/15 at 2:20 pm.  Pt has enough spiriva to last until her appt. Follow-up by: Vernie Murders,  March 27, 2009 3:59 PM

## 2010-03-17 NOTE — Progress Notes (Signed)
Summary: Rx refill req  Phone Note Refill Request Message from:  Patient on September 15, 2009 8:36 AM  Refills Requested: Medication #1:  OXYCODONE HCL 5 MG TABS 1 by mouth q 6 hrs as needed pain - to fill apr 25   Dosage confirmed as above?Dosage Confirmed  Method Requested: Pick up at Office Initial call taken by: Margaret Pyle, CMA,  September 15, 2009 8:37 AM  Follow-up for Phone Call        Pt informed, Rx in cabinet for pt pick up Follow-up by: Margaret Pyle, CMA,  September 15, 2009 1:05 PM    New/Updated Medications: OXYCODONE HCL 5 MG TABS (OXYCODONE HCL) 1 by mouth q 6 hrs as needed pain - to fill Sep 15, 2009 Prescriptions: OXYCODONE HCL 5 MG TABS (OXYCODONE HCL) 1 by mouth q 6 hrs as needed pain - to fill Sep 15, 2009  #120 x 0   Entered and Authorized by:   Corwin Levins MD   Signed by:   Corwin Levins MD on 09/15/2009   Method used:   Print then Give to Patient   RxID:   [id number]  done hardcopy to LIM side B - dahlia Corwin Levins MD  September 15, 2009 12:42 PM

## 2010-03-17 NOTE — Assessment & Plan Note (Signed)
Summary: 6 mos f/u / # / cd   Vital Signs:  Patient profile:   70 year old female Height:      62 inches Weight:      171.13 pounds BMI:     31.41 O2 Sat:      92 % on Room air Temp:     98.6 degrees F oral Pulse rate:   73 / minute BP sitting:   120 / 72  (left arm) Cuff size:   regular  Vitals Entered By: Zella Ball Ewing CMA Duncan Dull) (October 23, 2009 10:41 AM)  O2 Flow:  Room air  Preventive Care Screening  Bone Density:    Date:  05/16/2008    Next Due:  05/2010    Results:  abnormal std dev  Last Flu Shot:    Date:  10/23/2009    Results:  Fluvax 3+  CC: 6 month followup/RE   Primary Care Provider:  Corwin Levins MD  CC:  6 month followup/RE.  History of Present Illness: here to f/u and wellness - iron incr to 2 per day per oncology, and ace stopped per renal;  Pt denies CP, worsening sob, doe, wheezing, orthopnea, pnd, worsening LE edema, palps, dizziness or syncope  Pt denies new neuro symptoms such as headache, facial or extremity weakness  No fever, wt loss, night sweats, loss of appetite or other constitutional symptoms  Does have pain for 1  wk to left lower back, mild, intemittinet, achy, and no LE pain/numb/weakness.  Worse to stand up.  No falls or injury.  Also with nocturia x 4, tak fluid pill in the AM, and drinks fluids during the day as well.  No dysuria, freq, urgecny or or hemautira o/w.  overall pain controlled - needs refills.    Problems Prior to Update: 1)  Preventive Health Care  (ICD-V70.0) 2)  Foot Pain, Left  (ICD-729.5) 3)  Leg Pain, Left  (ICD-729.5) 4)  Epistaxis, Recurrent  (ICD-784.7) 5)  Depression  (ICD-311) 6)  Cervicalgia  (ICD-723.1) 7)  Fatigue  (ICD-780.79) 8)  Leg Pain, Bilateral  (ICD-729.5) 9)  Plantar Fasciitis, Left  (ICD-728.71) 10)  Bursitis, Left Hip  (ICD-726.5) 11)  Back Pain  (ICD-724.5) 12)  Peripheral Edema  (ICD-782.3) 13)  Cellulitis, Leg, Left  (ICD-682.6) 14)  Back Pain  (ICD-724.5) 15)  Spondylosis, Cervical,  With Radiculopathy  (ICD-723.4) 16)  Neck Pain, Acute  (ICD-723.1) 17)  Aortic Stenosis/ Insufficiency, Non-rheumatic  (ICD-424.1) 18)  Stricture and Stenosis of Esophagus  (ICD-530.3) 19)  Barretts Esophagus  (ICD-530.85) 20)  Pruritus  (ICD-698.9) 21)  Uti  (ICD-599.0) 22)  Anemia-iron Deficiency  (ICD-280.9) 23)  Pre-operative Cardiac Exam  (ICD-V72.81) 24)  Pre-operative Cardiovascular Examination  (ICD-V72.81) 25)  C O P D  (ICD-496) 26)  Carcinoma, Lung, Squamous Cell  (ICD-162.9) 27)  Anxiety  (ICD-300.00) 28)  Insomnia-sleep Disorder-unspec  (ICD-780.52) 29)  Abnormal Chest Xray  (ICD-793.1) 30)  Pneumonia  (ICD-486) 31)  Sinusitis- Acute-nos  (ICD-461.9) 32)  Preventive Health Care  (ICD-V70.0) 33)  Anemia of Other Chronic Disease  (ICD-285.29) 34)  Hyperkalemia  (ICD-276.7) 35)  Renal Insufficiency  (ICD-588.9) 36)  Sinusitis, Chronic  (ICD-473.9) 37)  Frequency, Urinary  (ICD-788.41) 38)  Sinusitis- Acute-nos  (ICD-461.9) 39)  Pneumonia  (ICD-486) 40)  Foot Pain, Right  (ICD-729.5) 41)  Preventive Health Care  (ICD-V70.0) 42)  Dyspepsia  (ICD-536.8) 43)  Dysphagia Unspecified  (ICD-787.20) 44)  Abdominal Pain, Generalized  (ICD-789.07) 45)  Uti  (  ICD-599.0) 46)  Osteoarthritis, Knee, Left  (ICD-715.96) 47)  Positive Ppd  (ICD-795.5) 48)  Pericarditis  (ICD-423.9) 49)  Gerd  (ICD-530.81) 50)  Osteoporosis  (ICD-733.00) 51)  Allergic Rhinitis  (ICD-477.9) 52)  Positive Tb Skin Test, 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 Take 1 Tablet By Mouth Once A Day 2)  Omeprazole 20 Mg  Cpdr (Omeprazole) .... 2 By Mouth Once Daily 3)  Fosamax 70 Mg  Tabs (Alendronate Sodium) .... Take 1 Tablet By Mouth Once A Week 4)  Fluticasone Propionate 0.05 %  Crea (Fluticasone  Propionate) .... As Needed 5)  Vitamin D3 1000 Unit  Tabs (Cholecalciferol) .Marland Kitchen.. 1 By Mouth Daily 6)  Zolpidem Tartrate 5 Mg Tabs (Zolpidem Tartrate) .Marland Kitchen.. 1 By Mouth At Bedtime As Needed Sleep 7)  Alprazolam 0.5 Mg Tabs (Alprazolam) .Marland Kitchen.. 1 By Mouth Two Times A Day As Needed 8)  Ferrex 150 150 Mg Caps (Polysaccharide Iron Complex) .Marland Kitchen.. 1 By Mouth Once Daily 9)  Benazepril Hcl 10 Mg Tabs (Benazepril Hcl) .Marland Kitchen.. 1 By Mouth Once Daily 10)  Furosemide 40 Mg Tabs (Furosemide) .... 1/2 - 1 By Mouth Once Daily 11)  Proventil Hfa 108 (90 Base) Mcg/act Aers (Albuterol Sulfate) .... 2 Puffs Four Times Per Day As Needed For Shortness of Breath 12)  Sertraline Hcl 50 Mg Tabs (Sertraline Hcl) .Marland Kitchen.. 1po Once Daily 13)  Oxycodone Hcl 5 Mg Tabs (Oxycodone Hcl) .Marland Kitchen.. 1 By Mouth Q 6 Hrs As Needed Pain - To Fill Sep 15, 2009  Current Medications (verified): 1)  Pravachol 40 Mg Tabs (Pravastatin Sodium) .... Take 1 Tablet By Mouth Once A Day 2)  Omeprazole 20 Mg  Cpdr (Omeprazole) .... 2 By Mouth Once Daily 3)  Fosamax 70 Mg  Tabs (Alendronate Sodium) .... Take 1 Tablet By Mouth Once A Week 4)  Vitamin D3 1000 Unit  Tabs (Cholecalciferol) .Marland Kitchen.. 1 By Mouth Daily 5)  Zolpidem Tartrate 5 Mg Tabs (Zolpidem Tartrate) .Marland Kitchen.. 1 By Mouth At Bedtime As Needed Sleep 6)  Ferrex 150 150 Mg Caps (Polysaccharide Iron Complex) .... 2 By Mouth Once Daily 7)  Furosemide 40 Mg Tabs (Furosemide) .... 1/2 - 1 By Mouth Once Daily 8)  Proventil Hfa 108 (90 Base) Mcg/act Aers (Albuterol Sulfate) .... 2 Puffs Four Times Per Day As Needed For Shortness of Breath 9)  Sertraline Hcl 50 Mg Tabs (Sertraline Hcl) .Marland Kitchen.. 1po Once Daily 10)  Oxycodone Hcl 5 Mg Tabs (Oxycodone Hcl) .Marland Kitchen.. 1 By Mouth Q 6 Hrs As Needed Pain - To Fill Fill Dec 22, 2009  Allergies (verified): 1)  ! Pcn 2)  ! Amoxicillin 3)  ! Codeine 4)  ! Hydrocodone 5)  * Fentanyl  Past History:  Past Surgical History: Last updated: 11/14/2008 Hysterectomy Tubal ligation Pentax  video bronchoscope...06/20/2008... Left upper lobe lesion obstructing the left upper lobe   orifice, probable malignancy until proven otherwise.   Tumor removed from lung 08/06/08  Family History: Last updated: 07/01/2008 Family History High cholesterol Family History Hypertension colon cancer Mother-deceased ? cause when patient was 30 years old Father- deceased ? 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 Allergic rhinitis Osteoporosis GERD Esophageal Stricture Barrett's Esophagus hx of pericarditis chronic right shoulder pain left knee djd Renal insufficiency  - Dr Kathrene Bongo Anxiety Anemia-iron deficiency Lung Cancer - dr Shirline Frees and dr [name] Depression  Review of Systems  The patient denies anorexia, fever, weight loss, weight gain, vision loss, decreased hearing, hoarseness, chest pain, syncope, dyspnea on exertion, peripheral edema, prolonged cough, headaches, hemoptysis, abdominal pain, melena, hematochezia, severe indigestion/heartburn, hematuria, muscle weakness, suspicious skin lesions, transient blindness, difficulty walking, depression, unusual weight change, abnormal bleeding, enlarged lymph nodes, and angioedema.         all otherwise negative per pt -    Physical Exam  General:  alert and overweight-appearing.   Head:  normocephalic and atraumatic.   Eyes:  vision grossly intact, pupils equal, and pupils round.   Ears:  R ear normal and L ear normal.   Nose:  no external deformity and no  nasal discharge.   Mouth:  no gingival abnormalities and pharynx pink and moist.   Neck:  supple and no masses.   Lungs:  normal respiratory effort and normal breath sounds.   Heart:  normal rate and regular rhythm.   Abdomen:  soft, non-tender, and normal bowel sounds.   Msk:  no joint tenderness and no joint swelling.  ,has mild right lumbar tender but no swelling or erythema Extremities:  no edema, no erythema  Neurologic:  cranial nerves II-XII intact and strength normal in all extremities.     Impression & Recommendations:  Problem # 1:  Preventive Health Care (ICD-V70.0) Overall doing well, age appropriate education and counseling updated and referral for appropriate preventive services done unless declined, immunizations up to date or declined, diet counseling done if overweight, urged to quit smoking if smokes , most recent labs reviewed and current ordered if appropriate, ecg reviewed or declined (interpretation per ECG scanned in the EMR if done); information regarding Medicare Prevention requirements given if appropriate; speciality referrals updated as appropriate   Problem # 2:  BACK PAIN (ICD-724.5)  Her updated medication list for this problem includes:    Oxycodone Hcl 5 Mg Tabs (Oxycodone hcl) .Marland Kitchen... 1 by mouth q 6 hrs as needed pain - to fill fill Dec 22, 2009 chronic recurrent, stable - for med refills  Problem # 3:  HYPERTENSION (ICD-401.9)  The following medications were removed from the medication list:    Benazepril Hcl 10 Mg Tabs (Benazepril hcl) .Marland Kitchen... 1 by mouth once daily Her updated medication list for this problem includes:    Furosemide 40 Mg Tabs (Furosemide) .Marland Kitchen... 1/2 - 1 by mouth once daily  BP today: 120/72 Prior BP: 126/60 (06/30/2009)  Prior 10 Yr Risk Heart Disease: 15 % (01/03/2009)  Labs Reviewed: K+: 4.7 (10/21/2009) Creat: : 1.6 (10/21/2009)   Chol: 176 (10/21/2009)   HDL: 37.20 (10/21/2009)   LDL: 116 (10/21/2009)   TG: 115.0  (10/21/2009) stable overall by hx and exam, ok to continue meds/tx as is   Complete Medication List: 1)  Pravachol 40 Mg Tabs (Pravastatin sodium) .... Take 1 tablet by mouth once a day 2)  Omeprazole 20 Mg Cpdr (Omeprazole) .... 2 by mouth once daily 3)  Fosamax 70 Mg Tabs (Alendronate sodium) .... Take 1 tablet by mouth once a week 4)  Vitamin  D3 1000 Unit Tabs (Cholecalciferol) .Marland Kitchen.. 1 by mouth daily 5)  Zolpidem Tartrate 5 Mg Tabs (Zolpidem tartrate) .Marland Kitchen.. 1 by mouth at bedtime as needed sleep 6)  Ferrex 150 150 Mg Caps (Polysaccharide iron complex) .... 2 by mouth once daily 7)  Furosemide 40 Mg Tabs (Furosemide) .... 1/2 - 1 by mouth once daily 8)  Proventil Hfa 108 (90 Base) Mcg/act Aers (Albuterol sulfate) .... 2 puffs four times per day as needed for shortness of breath 9)  Sertraline Hcl 50 Mg Tabs (Sertraline hcl) .Marland Kitchen.. 1po once daily 10)  Oxycodone Hcl 5 Mg Tabs (Oxycodone hcl) .Marland Kitchen.. 1 by mouth q 6 hrs as needed pain - to fill fill Dec 22, 2009  Other Orders: Flu Vaccine 10yrs + MEDICARE PATIENTS (S0630) Administration Flu vaccine - MCR (Z6010)  Patient Instructions: 1)  you had the flu shot today 2)  you can cancel the oct 26 appt with me 3)  please keep your appt oct 4 as planned 4)  you are given the pain med refills today 5)  Please call Dr Jon Gills office to find out when she might want to see you back 6)  Please schedule a follow-up appointment in 6 months with: 7)  BMP prior to visit, ICD-9: 585.2 8)  Lipid Panel prior to visit, ICD-9: 272.0 Prescriptions: OXYCODONE HCL 5 MG TABS (OXYCODONE HCL) 1 by mouth q 6 hrs as needed pain - to fill fill Dec 22, 2009  #120 x 0   Entered and Authorized by:   Corwin Levins MD   Signed by:   Corwin Levins MD on 10/23/2009   Method used:   Print then Give to Patient   RxID:   [id number] OXYCODONE HCL 5 MG TABS (OXYCODONE HCL) 1 by mouth q 6 hrs as needed pain - to fill fill Nov 22, 2009  #120 x 0   Entered and Authorized by:    Corwin Levins MD   Signed by:   Corwin Levins MD on 10/23/2009   Method used:   Print then Give to Patient   RxID:   [id number] OXYCODONE HCL 5 MG TABS (OXYCODONE HCL) 1 by mouth q 6 hrs as needed pain - to fill fill sept 7, 2011  #120 x 0   Entered and Authorized by:   Corwin Levins MD   Signed by:   Corwin Levins MD on 10/23/2009   Method used:   Print then Give to Patient   RxID:   [id number]      Flu Vaccine Consent Questions     Do you have a history of severe allergic reactions to this vaccine? no    Any prior history of allergic reactions to egg and/or gelatin? no    Do you have a sensitivity to the preservative Thimersol? no    Do you have a past history of Guillan-Barre Syndrome? no    Do you currently have an acute febrile illness? no    Have you ever had a severe reaction to latex? no    Vaccine information given and explained to patient? yes    Are you currently pregnant? no    Lot Number:[id number]   Exp Date:08/15/2010   Site Given  Left Deltoid IMlu

## 2010-03-17 NOTE — Progress Notes (Signed)
Summary: house being painted  Phone Note Call from Patient   Caller: Patient Call For: alva Summary of Call: pt have questions about whether she should stay in her house while it is being painted. Initial call taken by: Rickard Patience,  September 26, 2009 3:48 PM  Follow-up for Phone Call        called and spoke with pt and she stated that her son will be painting her house on the inside of her house at the end of the month and pt wanted to know if she should stay in the house while he is doing this with her condition.  please advise.  pt is aware she may not get a call back until monday. Randell Loop [facility]  September 26, 2009 4:01 PM   Additional Follow-up for Phone Call Additional follow up Details #1::        Preferably not - for a day or two. Additional Follow-up by: Comer Locket. Vassie Loll MD,  September 29, 2009 1:26 PM    Additional Follow-up for Phone Call Additional follow up Details #2::    Called, spoke with pt.  Pt advised of above recs per RA.  She verbalized understanding. Gweneth Dimitri RN  September 29, 2009 1:45 PM

## 2010-03-17 NOTE — Assessment & Plan Note (Signed)
Summary: 1 year follow up  Medications Added ALPRAZOLAM 0.5 MG TABS (ALPRAZOLAM) 1 tab two times a day as needed        Visit Type:  1 yr f/u Primary Provider:  Corwin Levins MD  CC:  edema/leg/feet....denies any other complaints today.Jody KitchenMarland Kitchenpt wants a copy of today's office note to give to her son.  History of Present Illness: 70 yo AAF with history of HTN, hyperlipidemia, esophagitis, pericarditis but no prior diagnosis of CAD who was  found to have an obstructing left upper lobe mass c/w squamous cell carcinoma. She was referred in May of 2010  for cardiology risk assessment prior to planned thoracotomy which she had in June 2010. Her echo prior to the surgery showed normal LV function with mild diastolic dysfunction and mild AI.  She has done well since then. I last saw her a year ago and planned to see her back today with a repeat echo. Her surgery went well.   She has had no chest pain with exertion, no shortness of breath with exertion, palpitations, near syncope, syncope or lower ext edema. She does mention a nosebleed occasionally but has been seen by ENT and no recommendations were made.    Current Medications (verified): 1)  Pravachol 40 Mg Tabs (Pravastatin Sodium) .... Take 1 Tablet By Mouth Once A Day 2)  Omeprazole 20 Mg  Cpdr (Omeprazole) .... 2 By Mouth Once Daily 3)  Fosamax 70 Mg  Tabs (Alendronate Sodium) .... Take 1 Tablet By Mouth Once A Week 4)  Vitamin D3 1000 Unit  Tabs (Cholecalciferol) .[name].. 1 By Mouth Daily 5)  Zolpidem Tartrate 5 Mg Tabs (Zolpidem Tartrate) .[name].. 1 By Mouth At Bedtime As Needed Sleep 6)  Ferrex 150 150 Mg Caps (Polysaccharide Iron Complex) .... 2 By Mouth Once Daily 7)  Furosemide 40 Mg Tabs (Furosemide) .... 1/2 - 1 By Mouth Once Daily 8)  Proventil Hfa 108 (90 Base) Mcg/act Aers (Albuterol Sulfate) .... 2 Puffs Four Times Per Day As Needed For Shortness of Breath 9)  Oxycodone Hcl 5 Mg Tabs (Oxycodone Hcl) .[name].. 1 By Mouth Q 6 Hrs As Needed Pain -  To Fill Fill Dec 22, 2009 10)  Alprazolam 0.5 Mg Tabs (Alprazolam) .[name].. 1 Tab Two Times A Day As Needed  Allergies: 1)  ! Pcn 2)  ! Amoxicillin 3)  ! Codeine 4)  ! 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    Vital Signs:  Patient profile:   70 year old female Height:      62 inches Weight:  169.8 pounds BMI:     31.17 Pulse rate:   63 / minute Pulse rhythm:   irregular BP sitting:   132 / 64  (left arm) Cuff size:   large  Vitals Entered By: Danielle Rankin, CMA (November 18, 2009 9:40 AM)  Physical Exam  General:  General: Well developed, well nourished, NAD Musculoskeletal: Muscle strength 5/5 all ext Psychiatric: Mood and affect normal Neck: No JVD, no carotid bruits, no thyromegaly, no lymphadenopathy. Lungs:Clear  bilaterally, no wheezes, rhonci, crackles CV: RRR with soft systolic  murmur, No gallops rubs Abdomen: soft, NT, ND, BS present Extremities: No edema, pulses 2+.    EKG  Procedure date:  11/18/2009  Findings:      NSR, rate 63 bpm. Poor R wave progression.   Impression & Recommendations:  Problem # 1:  AORTIC STENOSIS/ INSUFFICIENCY, NON-RHEUMATIC (ICD-424.1) Mild to moderate AI by echo. Normal LV size and function. She also has moderate MR, mild to moderate TR and mild PI.  Will repeat echo in one year.   Her updated medication list for this problem includes:    Furosemide 40 Mg Tabs (Furosemide) .[name]... 1/2 - 1 by mouth once daily  Patient Instructions: 1)  Your physician recommends that you schedule a follow-up appointment in: 1 year. 2)  Your physician has requested that you have an echocardiogram in 1 year.  Echocardiography is a painless test that uses sound waves to create images of your heart. It provides your doctor with information about the size and shape of your heart and how well your heart's chambers and valves are working.  This procedure takes approximately one hour. There are no restrictions for this procedure.

## 2010-03-17 NOTE — Assessment & Plan Note (Signed)
Summary: 2 MO ROV /NWS   Vital Signs:  Patient profile:   70 year old female Height:      62 inches Weight:      167.75 pounds BMI:     30.79 O2 Sat:      98 % on Room air Temp:     98.4 degrees F oral Pulse rate:   86 / minute BP sitting:   140 / 72  (left arm) Cuff size:   regular  Vitals Entered ByZella Ball Ewing (April 18, 2009 11:39 AM)  O2 Flow:  Room air CC: 2 MO ROV/RE   Primary Care Provider:  Corwin Levins MD  CC:  2 MO ROV/RE.  History of Present Illness: left foot cellulitis resolved, but now with multiple other type compliants regarding the left back and LE;  first seems to be the left lower back acute on chronic pain now moderate for over a week, localized to the left lower back, dull and achy with some radiation to the left buttocks, worse to get up from chair, but no bowel or bladder changes, or radicular symptoms or LE numb or weakness.  No fever, night sweats, unsual wt loss.  Also for over a week with localized pain to the left lateral hip area tender to touch or lie on the  left side or walk as well, wikthout recent fall or trauma.  Also c/o left foot with pain and tender to the medial instep and heel area, worse to first get up int he am or first steps after sitting, now mild to moderate for several weeks.  Left knee seems to be ok, but below the knee c/o pain as well without sweling, erythema to the distal leg with ambulation and specificially asks about "getting circulation checked."  Has also known anemia, CKD and expresses sense to worsening general fatigue , but denies constitional symtpoms as above, OSA symtpoms, fever or other pain or symptoms.  Pt denies CP, sob, doe, wheezing, orthopnea, pnd, worsening LE edema, palps, dizziness or syncope   Pt denies new neuro symptoms such as headache, facial or extremity weakness   Problems Prior to Update: 1)  Fatigue  (ICD-780.79) 2)  Leg Pain, Bilateral  (ICD-729.5) 3)  Plantar Fasciitis, Left  (ICD-728.71) 4)   Bursitis, Left Hip  (ICD-726.5) 5)  Back Pain  (ICD-724.5) 6)  Peripheral Edema  (ICD-782.3) 7)  Cellulitis, Leg, Left  (ICD-682.6) 8)  Back Pain  (ICD-724.5) 9)  Spondylosis, Cervical, With Radiculopathy  (ICD-723.4) 10)  Neck Pain, Acute  (ICD-723.1) 11)  Aortic Stenosis/ Insufficiency, Non-rheumatic  (ICD-424.1) 12)  Stricture and Stenosis of Esophagus  (ICD-530.3) 13)  Barretts Esophagus  (ICD-530.85) 14)  Pruritus  (ICD-698.9) 15)  Uti  (ICD-599.0) 16)  Anemia-iron Deficiency  (ICD-280.9) 17)  Pre-operative Cardiac Exam  (ICD-V72.81) 18)  Pre-operative Cardiovascular Examination  (ICD-V72.81) 19)  C O P D  (ICD-496) 20)  Carcinoma, Lung, Squamous Cell  (ICD-162.9) 21)  Anxiety  (ICD-300.00) 22)  Insomnia-sleep Disorder-unspec  (ICD-780.52) 23)  Abnormal Chest Xray  (ICD-793.1) 24)  Pneumonia  (ICD-486) 25)  Sinusitis- Acute-nos  (ICD-461.9) 26)  Preventive Health Care  (ICD-V70.0) 27)  Anemia of Other Chronic Disease  (ICD-285.29) 28)  Hyperkalemia  (ICD-276.7) 29)  Renal Insufficiency  (ICD-588.9) 30)  Sinusitis, Chronic  (ICD-473.9) 31)  Frequency, Urinary  (ICD-788.41) 32)  Sinusitis- Acute-nos  (ICD-461.9) 33)  Pneumonia  (ICD-486) 34)  Foot Pain, Right  (ICD-729.5) 35)  Preventive Health Care  (ICD-V70.0)  36)  Dyspepsia  (ICD-536.8) 37)  Dysphagia Unspecified  (ICD-787.20) 38)  Abdominal Pain, Generalized  (ICD-789.07) 39)  Uti  (ICD-599.0) 40)  Osteoarthritis, Knee, Left  (ICD-715.96) 41)  Positive Ppd  (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 Take 1 Tablet By Mouth Once A Day 2)  Omeprazole 20 Mg  Cpdr  (Omeprazole) .... 2 By Mouth Once Daily 3)  Fosamax 70 Mg  Tabs (Alendronate Sodium) .... Take 1 Tablet By Mouth Once A Week 4)  Bentyl 20 Mg  Tabs (Dicyclomine Hcl) .Marland Kitchen.. 1 By Mouth Two Times A Day 5)  Fluticasone Propionate 0.05 %  Crea (Fluticasone Propionate) .... As Needed 6)  Vitamin D3 1000 Unit  Tabs (Cholecalciferol) .Marland Kitchen.. 1 By Mouth Daily 7)  Cetirizine Hcl 10 Mg Tabs (Cetirizine Hcl) .Marland Kitchen.. 1 By Mouth Once Daily As Needed 8)  Zolpidem Tartrate 5 Mg Tabs (Zolpidem Tartrate) .Marland Kitchen.. 1 By Mouth At Bedtime As Needed Sleep 9)  Alprazolam 0.25 Mg Tabs (Alprazolam) .Marland Kitchen.. 1 By Mouth Two Times A Day As Needed 10)  Flonase 50 Mcg/act  Susp (Fluticasone Propionate) .... Two Puffs Each Nostril Daily 11)  Hydrocodone-Acetaminophen 5-325 Mg Tabs (Hydrocodone-Acetaminophen) .Marland Kitchen.. 1 - 2 By Mouth Q 6 Hrs As Needed For Pain 12)  Ferrex 150 150 Mg Caps (Polysaccharide Iron Complex) .Marland Kitchen.. 1 By Mouth Once Daily 13)  Flexeril 5 Mg Tabs (Cyclobenzaprine Hcl) .Marland Kitchen.. 1po Three Times A Day As Needed 14)  Benazepril Hcl 10 Mg Tabs (Benazepril Hcl) .Marland Kitchen.. 1 By Mouth Once Daily 15)  Furosemide 40 Mg Tabs (Furosemide) .... 1/2 - 1 By Mouth Once Daily 16)  Proventil Hfa 108 (90 Base) Mcg/act Aers (Albuterol Sulfate) .... 2 Puffs Four Times Per Day As Needed For Shortness of Breath  Current Medications (verified): 1)  Pravachol 40 Mg Tabs (Pravastatin Sodium) .... Take 1 Tablet By Mouth Once A Day 2)  Omeprazole 20 Mg  Cpdr (Omeprazole) .... 2 By Mouth Once Daily 3)  Fosamax 70 Mg  Tabs (Alendronate Sodium) .... Take 1 Tablet By Mouth Once A Week 4)  Bentyl 20 Mg  Tabs (Dicyclomine Hcl) .Marland Kitchen.. 1 By Mouth Two Times A Day 5)  Fluticasone Propionate 0.05 %  Crea (Fluticasone Propionate) .... As Needed 6)  Vitamin D3 1000 Unit  Tabs (Cholecalciferol) .Marland Kitchen.. 1 By Mouth Daily 7)  Cetirizine Hcl 10 Mg Tabs (Cetirizine Hcl) .Marland Kitchen.. 1 By Mouth Once Daily As Needed 8)  Zolpidem Tartrate 5 Mg Tabs (Zolpidem Tartrate) .Marland Kitchen.. 1 By Mouth At Bedtime  As Needed Sleep 9)  Alprazolam 0.25 Mg Tabs (Alprazolam) .Marland Kitchen.. 1 By Mouth Two Times A Day As Needed 10)  Flonase 50 Mcg/act  Susp (Fluticasone Propionate) .... Two Puffs Each Nostril Daily 11)  Hydrocodone-Acetaminophen 5-325 Mg Tabs (Hydrocodone-Acetaminophen) .Marland Kitchen.. 1 - 2 By Mouth Q 6 Hrs As Needed For Pain 12)  Ferrex 150 150 Mg Caps (Polysaccharide Iron Complex) .Marland Kitchen.. 1 By Mouth Once Daily 13)  Flexeril 5 Mg Tabs (Cyclobenzaprine Hcl) .Marland Kitchen.. 1po Three Times A Day As Needed 14)  Benazepril Hcl 10 Mg Tabs (Benazepril Hcl) .Marland Kitchen.. 1 By Mouth Once Daily 15)  Furosemide 40 Mg Tabs (Furosemide) .... 1/2 -  1 By Mouth Once Daily 16)  Proventil Hfa 108 (90 Base) Mcg/act Aers (Albuterol Sulfate) .... 2 Puffs Four Times Per Day As Needed For Shortness of Breath 17)  Prednisone 10 Mg Tabs (Prednisone) .... 3po Qd For 3days, Then 2po Qd For 3days, Then 1po Qd For 3days, Then Stop  Allergies (verified): 1)  ! Pcn 2)  ! Amoxicillin 3)  ! Codeine 4)  ! 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 Left upper lobe lesion obstructing the left upper lobe   orifice, probable malignancy until proven otherwise.   Tumor removed from lung 08/06/08  Family History: Last updated: 07/01/2008 Family History High cholesterol Family History Hypertension colon cancer Mother-deceased ? cause when patient was 63 years old Father- deceased ? 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 Former Smoker-smoked for 45-50 pack years, stopped 2008 2 children widow  Risk Factors: Alcohol Use: 0 (01/13/2009)  Risk Factors: Smoking Status: quit > 6 months (01/13/2009) Packs/Day: 1 PPD (01/13/2009)  Review of Systems  The patient denies anorexia, fever, vision loss, decreased hearing, hoarseness, chest pain, syncope, dyspnea on exertion, peripheral edema, prolonged cough, headaches, hemoptysis, abdominal pain, melena, hematochezia, severe indigestion/heartburn, hematuria, incontinence, muscle weakness, suspicious skin lesions, difficulty walking, depression, unusual weight change, abnormal bleeding, enlarged lymph nodes, and angioedema.         all otherwise negative per pt   Physical Exam  General:  alert and overweight-appearing.  , not ill appearing Head:  normocephalic and atraumatic.   Eyes:  vision grossly intact, pupils equal, and pupils round.   Ears:  R ear normal and L ear normal.   Nose:  no external deformity and no nasal discharge.   Mouth:  no gingival abnormalities and pharynx pink and moist.   Neck:  supple and no masses.   Lungs:  normal respiratory effort and normal breath sounds.   Heart:  normal rate and regular rhythm.   Abdomen:  soft, non-tender, and normal bowel sounds.   Msk:  spine nontender,  left paravertebral tender noted;  no buttock tender, but has tender over the greater trochanter area;  left knee without effsion , NT and FROM;  left calve nontender nonswollen, left ankle without effusion and leg and foot no cellulitis  Extremities:  no edema, no erythema  Neurologic:  cranial nerves II-XII intact, strength normal in lower extremities, and sensation intact to light touch.     Impression & Recommendations:  Problem # 1:  BACK PAIN (ICD-724.5)  Her updated medication list for this problem includes:    Hydrocodone-acetaminophen  5-325 Mg Tabs (Hydrocodone-acetaminophen) .Marland Kitchen... 1 - 2 by mouth q 6 hrs as needed for pain    Flexeril 5 Mg Tabs (Cyclobenzaprine hcl) .Marland Kitchen... 1po three times a day as needed by exam c/w prob flare DJD/DDD - for prednisone and pain med  Problem # 2:  BURSITIS, LEFT HIP (ICD-726.5) for pain med, prednisone as above   Problem # 3:  PLANTAR FASCIITIS, LEFT (ICD-728.71)  refer podiatry, to  wear soft soled shoes, pain meds as above  Orders: Podiatry Referral (Podiatry)  Problem # 4:  LEG PAIN, BILATERAL (ICD-729.5) left leg  only today, with ? worse to ambulate, cant r/o neuritic, but for now will check LE dopplers Orders: Misc. Referral (Misc. Ref)  Problem # 5:  FATIGUE (ICD-780.79) exam benign, to check labs below; follow with expectant management  Orders: TLB-BMP (Basic Metabolic Panel-BMET) (80048-METABOL) TLB-Hepatic/Liver Function Pnl (80076-HEPATIC) TLB-CBC Platelet - w/Differential (85025-CBCD) TLB-IBC Pnl (Iron/FE;Transferrin) (83550-IBC) TLB-B12 + Folate Pnl (82746_82607-B12/FOL) TLB-Udip ONLY (81003-UDIP) TLB-TSH (Thyroid Stimulating Hormone) (84443-TSH)  Problem # 6:  RENAL INSUFFICIENCY (ICD-588.9) to check with above  Problem # 7:  ANEMIA-IRON DEFICIENCY (ICD-280.9)  Her updated medication list for this problem includes:    Ferrex 150 150 Mg Caps (Polysaccharide iron complex) .Marland Kitchen... 1 by mouth once daily to check cbc with above  Problem # 8:  PERIPHERAL EDEMA (ICD-782.3)  Her updated medication list for this problem includes:    Furosemide 40 Mg Tabs (Furosemide) .Marland Kitchen... 1/2 - 1 by mouth once daily currently resolved, follow  Complete Medication List: 1)  Pravachol 40 Mg Tabs (Pravastatin sodium) .... Take 1 tablet by mouth once a day 2)  Omeprazole 20 Mg Cpdr (Omeprazole) .... 2 by mouth once daily 3)  Fosamax 70 Mg Tabs (Alendronate sodium) .... Take 1 tablet by mouth once a week 4)  Bentyl 20 Mg Tabs (Dicyclomine hcl) .Marland Kitchen.. 1 by mouth two times a day 5)   Fluticasone Propionate 0.05 % Crea (Fluticasone propionate) .... As needed 6)  Vitamin D3 1000 Unit Tabs (Cholecalciferol) .Marland Kitchen.. 1 by mouth daily 7)  Cetirizine Hcl 10 Mg Tabs (Cetirizine hcl) .Marland Kitchen.. 1 by mouth once daily as needed 8)  Zolpidem Tartrate 5 Mg Tabs (Zolpidem tartrate) .Marland Kitchen.. 1 by mouth at bedtime as needed sleep 9)  Alprazolam 0.25 Mg Tabs (Alprazolam) .Marland Kitchen.. 1 by mouth two times a day as needed 10)  Flonase 50 Mcg/act Susp (Fluticasone propionate) .... Two puffs each nostril daily 11)  Hydrocodone-acetaminophen 5-325 Mg Tabs (Hydrocodone-acetaminophen) .Marland Kitchen.. 1 - 2 by mouth q 6 hrs as needed for pain 12)  Ferrex 150 150 Mg Caps (Polysaccharide iron complex) .Marland Kitchen.. 1 by mouth once daily 13)  Flexeril 5 Mg Tabs (Cyclobenzaprine hcl) .Marland Kitchen.. 1po three times a day as needed 14)  Benazepril Hcl 10 Mg Tabs (Benazepril hcl) .Marland Kitchen.. 1 by mouth once daily 15)  Furosemide 40 Mg Tabs (Furosemide) .... 1/2 - 1 by mouth once daily 16)  Proventil Hfa 108 (90 Base) Mcg/act Aers (Albuterol sulfate) .... 2 puffs four times per day as needed for shortness of breath 17)  Prednisone 10 Mg Tabs (Prednisone) .... 3po qd for 3days, then 2po qd for 3days, then 1po qd for 3days, then stop  Patient Instructions: 1)  Please go to the Lab in the basement for your blood and/or urine tests today 2)  You will be contacted about the referral(s) to: Leg artery test, as well as the podiatry referral 3)  Please take all new medications as prescribed - the pain medicine, and the prednisone  4)  Continue all previous medications as before this visit  5)  Please schedule a follow-up appointment in 6 months with CPX labs Prescriptions: PREDNISONE 10 MG TABS (PREDNISONE) 3po qd for 3days, then 2po qd for 3days, then 1po qd for 3days, then stop  #18 x 0   Entered and Authorized by:   Corwin Levins MD   Signed by:   Corwin Levins  MD on 04/18/2009   Method used:   Print then Give to Patient   RxID:    [id number] HYDROCODONE-ACETAMINOPHEN 5-325 MG TABS (HYDROCODONE-ACETAMINOPHEN) 1 - 2 by mouth q 6 hrs as needed for pain  #60 x 1   Entered and Authorized by:   Corwin Levins MD   Signed by:   Corwin Levins MD on 04/18/2009   Method used:   Print then Give to Patient   RxID:   [id number]

## 2010-03-17 NOTE — Consult Note (Signed)
Summary: [facility]  [facility]   Imported By: Sherian Rein 08/08/2009 10:33:17  _____________________________________________________________________  External Attachment:    Type:   Image     Comment:   External Document

## 2010-03-17 NOTE — Progress Notes (Signed)
Summary: pain med refill  Phone Note Call from Patient Call back at Home Phone [phone]   Caller: Patient Summary of Call: Patient called [facility] requesting refill on her pain medication. Please advise Initial call taken by: Rock Nephew CMA,  December 02, 2009 1:08 PM  Follow-up for Phone Call        Pt advised that she recieved Sept, Oct and Nov Rxs at last OV. Pt agreed. Follow-up by: Margaret Pyle, CMA,  December 02, 2009 1:32 PM

## 2010-03-17 NOTE — Progress Notes (Signed)
Summary: SICK/CB  Phone Note Call from Patient Call back at Home Phone [phone]   Caller: Patient Call For: PARRETT Summary of Call: PT WANTS  AN ANTIBODIC SAID SHE IS STILL SICK [facility] [address]  Initial call taken by: Tivis Ringer, CNA,  January 16, 2010 9:26 AM  Follow-up for Phone Call        Pt c/o productive cough with yellow mucus (getting better), facial pain and pressure, nasal congestion with green discharge. Denies fever, bodyaches, chills, n/v/d. [facility] [address] Zackery Barefoot CMA  January 16, 2010 10:38 AM   Additional Follow-up for Phone Call Additional follow up Details #1::        Zpack #1  Take as directed w/ food no refills mucinex dm two times a day as needed cough/congesion  saline nasal rinse as needed  rest , fluids, tylenol .  Please contact office for sooner follow up if symptoms do not improve or worsen   Additional Follow-up by: Rubye Oaks NP,  January 16, 2010 10:42 AM    Additional Follow-up for Phone Call Additional follow up Details #2::    Spoke with pt and advised of all recs per TP.  Pt verbalized understanding.  Rx was sent to [facility] [address] per pt request. Follow-up by: Vernie Murders,  January 16, 2010 10:54 AM  New/Updated Medications: ZITHROMAX Z-PAK 250 MG TABS (AZITHROMYCIN) take as directed Prescriptions: ZITHROMAX Z-PAK 250 MG TABS (AZITHROMYCIN) take as directed  #1 x 0   Entered by:   Vernie Murders   Authorized by:   Rubye Oaks NP   Signed by:   Vernie Murders on 01/16/2010   Method used:   [facility]ronically to        [facility] [phone][facility] (retail)       [address]       [place], [place]  [id number]       Ph: [phone] or [phone]       Fax: [phone]   RxID:   [id number]

## 2010-03-17 NOTE — Progress Notes (Signed)
  Phone Note Refill Request Message from:  Fax from Pharmacy on November 05, 2009 8:22 AM  Refills Requested: Medication #1:  FERREX 150 150 MG CAPS 2 by mouth once daily   Dosage confirmed as above?Dosage Confirmed   Notes: [facility] Initial call taken by: Robin Ewing CMA Duncan Dull),  November 05, 2009 8:22 AM    Prescriptions: FERREX 150 150 MG CAPS (POLYSACCHARIDE IRON COMPLEX) 2 by mouth once daily  #60 x 10   Entered by:   Zella Ball Ewing CMA (AAMA)   Authorized by:   Corwin Levins MD   Signed by:   Scharlene Gloss CMA (AAMA) on 11/05/2009   Method used:   Faxed to ...       [facility] (retail)       [address]       [place], [place]  [address]  [place]       Ph: [phone]       Fax: [phone]   RxID:   [id number]

## 2010-03-17 NOTE — Progress Notes (Signed)
Summary: Vitamin D3  Phone Note Refill Request Message from:  Fax from Pharmacy on April 07, 2009 9:16 AM  Refills Requested: Medication #1:  VITAMIN D3 1000 UNIT  TABS 1 by mouth daily Next Appointment Scheduled: 04-18-09 Initial call taken by: Lucious Groves,  April 07, 2009 9:16 AM    Prescriptions: VITAMIN D3 1000 UNIT  TABS (CHOLECALCIFEROL) 1 by mouth daily  #30 x 3   Entered by:   Lucious Groves   Authorized by:   Corwin Levins MD   Signed by:   Lucious Groves on 04/07/2009   Method used:   Electronically to        [facility] (retail)       [address]       [place], [place]  [address]       Ph: [phone]       Fax: [phone]   RxID:   [id number]

## 2010-03-17 NOTE — Assessment & Plan Note (Signed)
Summary: rov//lmr   Primary Provider/Referring Provider:  Corwin Levins MD  CC:  Followup to discuss meds.  Pt states she is unable to afford spiriva.  She states that her breathing has been doing well and she denies any complaints today.Marland Kitchen  History of Present Illness: 70/F, ex smoker for FU of COPD 5/10 s/p LUL obectomy for stg I a squamous cell CA She smoked 64yrs until 2008. Pre-op FEV1 was 1.53 (80%) & DLCO 48%  April 01, 2009 2:53 PM  CT 03/24/09 no recurrence Spiriva is very expensive & she questions need for this.  The patient complains of shortness of breath, but denies history of diagnosed COPD, chest tightness, chest pain worse with breathing and coughing, wheezing, cough, mucous production, nocturnal awakening, exercise induced symptoms, and congestion.  Current Medications (verified): 1)  Pravachol 40 Mg Tabs (Pravastatin Sodium) .... Take 1 Tablet By Mouth Once A Day 2)  Omeprazole 20 Mg  Cpdr (Omeprazole) .... 2 By Mouth Once Daily 3)  Fosamax 70 Mg  Tabs (Alendronate Sodium) .... Take 1 Tablet By Mouth Once A Week 4)  Bentyl 20 Mg  Tabs (Dicyclomine Hcl) .Marland Kitchen.. 1 By Mouth Two Times A Day 5)  Fluticasone Propionate 0.05 %  Crea (Fluticasone Propionate) .... As Needed 6)  Vitamin D3 1000 Unit  Tabs (Cholecalciferol) .Marland Kitchen.. 1 By Mouth Daily 7)  Cetirizine Hcl 10 Mg Tabs (Cetirizine Hcl) .Marland Kitchen.. 1 By Mouth Once Daily As Needed 8)  Zolpidem Tartrate 5 Mg Tabs (Zolpidem Tartrate) .Marland Kitchen.. 1 By Mouth At Bedtime As Needed Sleep 9)  Alprazolam 0.25 Mg Tabs (Alprazolam) .Marland Kitchen.. 1 By Mouth Two Times A Day As Needed 10)  Flonase 50 Mcg/act  Susp (Fluticasone Propionate) .... Two Puffs Each Nostril Daily 11)  Hydrocodone-Acetaminophen 5-325 Mg Tabs (Hydrocodone-Acetaminophen) .Marland Kitchen.. 1 - 2 By Mouth Q 6 Hrs As Needed For Pain 12)  Ferrex 150 150 Mg Caps (Polysaccharide Iron Complex) .Marland Kitchen.. 1 By Mouth Once Daily 13)  Flexeril 5 Mg Tabs (Cyclobenzaprine Hcl) .Marland Kitchen.. 1po Three Times A Day As Needed 14)   Benazepril Hcl 10 Mg Tabs (Benazepril Hcl) .Marland Kitchen.. 1 By Mouth Once Daily 15)  Furosemide 40 Mg Tabs (Furosemide) .... 1/2 - 1 By Mouth Once Daily  Allergies (verified): 1)  ! Pcn 2)  ! Amoxicillin 3)  ! Codeine 4)  ! 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  The patient denies anorexia, fever, weight loss, weight gain, vision loss, decreased hearing, hoarseness, chest pain, syncope, peripheral edema, prolonged cough, headaches, hemoptysis, abdominal pain, melena, hematochezia, severe indigestion/heartburn, hematuria, muscle weakness, suspicious skin lesions, difficulty walking, depression, unusual weight change, and abnormal bleeding.    Vital Signs:  Patient profile:   70 year old female Weight:      166 pounds O2 Sat:      97 % on Room air Temp:     98.3 degrees F oral Pulse rate:   79 /  minute BP sitting:   120 / 72  (left arm)  Vitals Entered By: Vernie Murders (April 01, 2009 2:28 PM)  O2 Flow:  Room air  Physical Exam  Additional Exam:  Gen. Pleasant, well-nourished, in no distress ENT - no lesions, no post nasal drip Neck: No JVD, no thyromegaly, no carotid bruits Lungs: no use of accessory muscles, no dullness to percussion, clear without rales or rhonchi  Cardiovascular: Rhythm regular, heart sounds  normal, no  murmurs or gallops, no peripheral edema Musculoskeletal: No deformities, no cyanosis or clubbing      Impression & Recommendations:  Problem # 1:  C O P D (ICD-496) OK to stop spiriva Trial of albuterol inhaler 2 puffs three times a day as needed (sample) Assess lung function next visit  Problem # 2:  CARCINOMA, LUNG, SQUAMOUS CELL (ICD-162.9) surveillance CTs per Onc  Other Orders: Pulmonary Referral (Pulmonary) Est. Patient Level III ([address])  Patient Instructions: 1)  Copy sent to: Dr Shirline Frees, Dr [name] 2)  Please schedule a follow-up appointment in 3 months. 3)  OK to stop spiriva 4)  Trial of albuterol inhaler 2 puffs three times a day as needed (sample) 5)  Call if worse

## 2010-03-17 NOTE — Progress Notes (Signed)
  Phone Note Refill Request  on February 26, 2009 3:38 PM  Refills Requested: Medication #1:  FERREX 150 150 MG CAPS 1 by mouth once daily   Dosage confirmed as above?Dosage Confirmed   Notes: Psychologist, forensic, [employer] Initial call taken by: Scharlene Gloss,  February 26, 2009 3:38 PM    Prescriptions: FERREX 150 150 MG CAPS (POLYSACCHARIDE IRON COMPLEX) 1 by mouth once daily  #30 x 5   Entered by:   Scharlene Gloss   Authorized by:   Corwin Levins MD   Signed by:   Scharlene Gloss on 02/26/2009   Method used:   F[facility]ed to ...       [facility]       [address]       [place], [place]  [address]       Ph: [phone]       Fax: [phone]   RxID:   [id number]

## 2010-03-17 NOTE — Assessment & Plan Note (Signed)
Summary: 4 mos f/u #/cd   Vital Signs:  Patient profile:   70 year old female Height:      62 inches Weight:      165 pounds BMI:     30.29 O2 Sat:      98 % on Room air Temp:     98.6 degrees F oral Pulse rate:   73 / minute BP sitting:   108 / 58  (left arm) Cuff size:   regular  Vitals Entered ByZella Ball Ewing (June 09, 2009 11:13 AM)  O2 Flow:  Room air  CC: 4 month followup/Re   Primary Care Provider:  Corwin Levins MD  CC:  4 month followup/Re.  History of Present Illness: here to f/u; gets up to urinate three times per night but recent UA neg for infection;  stopped the flonase b/c too drying ; nosebleeds have stopped;  c/o left LLE pain below the knee (swelling occur occas but none now);  starts at the instep and ankle and radiates to the ant shin level, worse to walk but not neecess to stop wallking or rest;  is tender to touch as wel without sewlling or erythema - seems to have a bony enlargement mild to the mid foot instep as well related.  Wants a form filled out; she considers herself retired now and doesnot want to go back to work, and wants a disability form signe dto state "never return to work" due to the nosebleeds and left foot and ankle and leg as above.   Needs refills pain meds and ambien.    Pt denies CP, sob, doe, wheezing, orthopnea, pnd, worsening LE edema, palps, dizziness or syncope  Pt denies new neuro symptoms such as headache, facial or extremity weakness   Problems Prior to Update: 1)  Foot Pain, Left  (ICD-729.5) 2)  Leg Pain, Left  (ICD-729.5) 3)  Epistaxis, Recurrent  (ICD-784.7) 4)  Depression  (ICD-311) 5)  Cervicalgia  (ICD-723.1) 6)  Fatigue  (ICD-780.79) 7)  Leg Pain, Bilateral  (ICD-729.5) 8)  Plantar Fasciitis, Left  (ICD-728.71) 9)  Bursitis, Left Hip  (ICD-726.5) 10)  Back Pain  (ICD-724.5) 11)  Peripheral Edema  (ICD-782.3) 12)  Cellulitis, Leg, Left  (ICD-682.6) 13)  Back Pain  (ICD-724.5) 14)  Spondylosis, Cervical, With  Radiculopathy  (ICD-723.4) 15)  Neck Pain, Acute  (ICD-723.1) 16)  Aortic Stenosis/ Insufficiency, Non-rheumatic  (ICD-424.1) 17)  Stricture and Stenosis of Esophagus  (ICD-530.3) 18)  Barretts Esophagus  (ICD-530.85) 19)  Pruritus  (ICD-698.9) 20)  Uti  (ICD-599.0) 21)  Anemia-iron Deficiency  (ICD-280.9) 22)  Pre-operative Cardiac Exam  (ICD-V72.81) 23)  Pre-operative Cardiovascular Examination  (ICD-V72.81) 24)  C O P D  (ICD-496) 25)  Carcinoma, Lung, Squamous Cell  (ICD-162.9) 26)  Anxiety  (ICD-300.00) 27)  Insomnia-sleep Disorder-unspec  (ICD-780.52) 28)  Abnormal Chest Xray  (ICD-793.1) 29)  Pneumonia  (ICD-486) 30)  Sinusitis- Acute-nos  (ICD-461.9) 31)  Preventive Health Care  (ICD-V70.0) 32)  Anemia of Other Chronic Disease  (ICD-285.29) 33)  Hyperkalemia  (ICD-276.7) 34)  Renal Insufficiency  (ICD-588.9) 35)  Sinusitis, Chronic  (ICD-473.9) 36)  Frequency, Urinary  (ICD-788.41) 37)  Sinusitis- Acute-nos  (ICD-461.9) 38)  Pneumonia  (ICD-486) 39)  Foot Pain, Right  (ICD-729.5) 40)  Preventive Health Care  (ICD-V70.0) 41)  Dyspepsia  (ICD-536.8) 42)  Dysphagia Unspecified  (ICD-787.20) 43)  Abdominal Pain, Generalized  (ICD-789.07) 44)  Uti  (ICD-599.0) 45)  Osteoarthritis, Knee, Left  (ICD-715.96) 46)  Positive Ppd  (ICD-795.5) 47)  Pericarditis  (ICD-423.9) 48)  Gerd  (ICD-530.81) 49)  Osteoporosis  (ICD-733.00) 50)  Allergic Rhinitis  (ICD-477.9) 51)  Positive Tb Skin Test, 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 Take 1 Tablet By Mouth Once A Day 2)  Omeprazole 20 Mg  Cpdr (Omeprazole) .... 2 By Mouth Once Daily 3)  Fosamax 70 Mg  Tabs (Alendronate Sodium) .... Take 1 Tablet By Mouth Once A Week 4)  Fluticasone Propionate 0.05 %  Crea (Fluticasone Propionate)  .... As Needed 5)  Vitamin D3 1000 Unit  Tabs (Cholecalciferol) .Marland Kitchen.. 1 By Mouth Daily 6)  Cetirizine Hcl 10 Mg Tabs (Cetirizine Hcl) .Marland Kitchen.. 1 By Mouth Once Daily As Needed 7)  Zolpidem Tartrate 5 Mg Tabs (Zolpidem Tartrate) .Marland Kitchen.. 1 By Mouth At Bedtime As Needed Sleep 8)  Alprazolam 0.5 Mg Tabs (Alprazolam) .Marland Kitchen.. 1 By Mouth Two Times A Day As Needed 9)  Flonase 50 Mcg/act  Susp (Fluticasone Propionate) .... Two Puffs Each Nostril Daily 10)  Hydrocodone-Acetaminophen 5-325 Mg Tabs (Hydrocodone-Acetaminophen) .Marland Kitchen.. 1 - 2 By Mouth Q 6 Hrs As Needed For Pain 11)  Ferrex 150 150 Mg Caps (Polysaccharide Iron Complex) .Marland Kitchen.. 1 By Mouth Once Daily 12)  Benazepril Hcl 10 Mg Tabs (Benazepril Hcl) .Marland Kitchen.. 1 By Mouth Once Daily 13)  Furosemide 40 Mg Tabs (Furosemide) .... 1/2 - 1 By Mouth Once Daily 14)  Proventil Hfa 108 (90 Base) Mcg/act Aers (Albuterol Sulfate) .... 2 Puffs Four Times Per Day As Needed For Shortness of Breath 15)  Sertraline Hcl 50 Mg Tabs (Sertraline Hcl) .Marland Kitchen.. 1po Once Daily 16)  Oxycodone Hcl 5 Mg Tabs (Oxycodone Hcl) .Marland Kitchen.. 1 By Mouth Q 6 Hrs As Needed Pain  Current Medications (verified): 1)  Pravachol 40 Mg Tabs (Pravastatin Sodium) .... Take 1 Tablet By Mouth Once A Day 2)  Omeprazole 20 Mg  Cpdr (Omeprazole) .... 2 By Mouth Once Daily 3)  Fosamax 70 Mg  Tabs (Alendronate Sodium) .... Take 1 Tablet By Mouth Once A Week 4)  Fluticasone Propionate 0.05 %  Crea (Fluticasone Propionate) .... As Needed 5)  Vitamin D3 1000 Unit  Tabs (Cholecalciferol) .Marland Kitchen.. 1 By Mouth Daily 6)  Cetirizine Hcl 10 Mg Tabs (Cetirizine Hcl) .Marland Kitchen.. 1 By Mouth Once Daily As Needed 7)  Zolpidem Tartrate 5 Mg Tabs (Zolpidem Tartrate) .Marland Kitchen.. 1 By Mouth At Bedtime As Needed Sleep 8)  Alprazolam 0.5 Mg Tabs (Alprazolam) .Marland Kitchen.. 1 By Mouth Two Times A Day As Needed 9)  Hydrocodone-Acetaminophen 5-325 Mg Tabs (Hydrocodone-Acetaminophen) .Marland Kitchen.. 1 - 2 By Mouth Q 6 Hrs As Needed For Pain 10)  Ferrex 150 150 Mg Caps (Polysaccharide Iron  Complex) .Marland Kitchen.. 1 By Mouth Once Daily 11)  Benazepril Hcl 10 Mg Tabs (Benazepril Hcl) .Marland Kitchen.. 1 By Mouth Once Daily 12)  Furosemide 40 Mg Tabs (Furosemide) .... 1/2 - 1 By Mouth Once Daily 13)  Proventil Hfa 108 (90 Base) Mcg/act Aers (Albuterol Sulfate) .... 2 Puffs Four Times Per Day As Needed For Shortness of Breath 14)  Sertraline Hcl 50 Mg Tabs (Sertraline Hcl) .Marland Kitchen.. 1po Once Daily 15)  Oxycodone Hcl 5 Mg Tabs (Oxycodone Hcl) .Marland Kitchen.. 1 By Mouth Q 6 Hrs As Needed Pain - To Fill Jun 09, 2009  Allergies (verified): 1)  ! Pcn 2)  ! Amoxicillin 3)  !  Codeine 4)  ! 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 Left upper lobe lesion obstructing the left upper lobe   orifice, probable malignancy until proven otherwise.   Tumor removed from lung 08/06/08  Family History: Last updated: 07/01/2008 Family History High cholesterol Family History Hypertension colon cancer Mother-deceased ? cause when patient was 44 years old Father- deceased ? 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 Former Smoker-smoked for 45-50 pack years, stopped 2008 2 children widow  Risk Factors: Alcohol Use: 0 (01/13/2009)  Risk Factors: Smoking Status: quit > 6 months (01/13/2009) Packs/Day: 1 PPD (01/13/2009)  Review of Systems  The patient denies anorexia, fever,  vision loss, decreased hearing, hoarseness, chest pain, syncope, dyspnea on exertion, peripheral edema, prolonged cough, headaches, hemoptysis, abdominal pain, melena, hematochezia, severe indigestion/heartburn, hematuria, muscle weakness, suspicious skin lesions, transient blindness, difficulty walking, depression, unusual weight change, abnormal bleeding, enlarged lymph nodes, and angioedema.         all otherwise negative per pt -    Physical Exam  General:  alert and overweight-appearing.   Head:  normocephalic and atraumatic.   Eyes:  vision grossly intact, pupils equal, and pupils round.   Ears:  R ear normal and L ear normal.   Nose:  no external deformity and no nasal discharge.   Mouth:  no gingival abnormalities and pharynx pink and moist.   Neck:  supple and no masses.   Lungs:  normal respiratory effort and normal breath sounds.   Heart:  normal rate and regular rhythm.   Abdomen:  soft, non-tender, and normal bowel sounds.   Msk:  no joint tenderness and no joint swelling.  , has mild tender to left ant leg without erythema, swelling, rash, extending to medial ankle and instep area Extremities:  no edema, no erythema  Neurologic:  cranial nerves II-XII intact and strength normal in all extremities.     Impression & Recommendations:  Problem # 1:  Preventive Health Care (ICD-V70.0) Overall doing well, age appropriate education and counseling updated and referral for appropriate preventive services done unless declined, immunizations up to date or declined, diet counseling done if overweight, urged to quit smoking if smokes , most recent labs reviewed and current ordered if appropriate, ecg reviewed or declined (interpretation per ECG scanned in the EMR if done); information regarding Medicare Prevention requirements given if appropriate   Problem # 2:  RENAL INSUFFICIENCY (ICD-588.9)  mild worsening - refer renal for further eval and tx  Orders: Nephrology Referral  (Nephro)  Problem # 3:  FOOT PAIN, LEFT (ICD-729.5)  suspect soft tisue prob tendinopathy involving the ant lower leg, but also with mild bony abnormlaity of the instep - liekly has a midfoot bony issue as well; will refer to Dr Lennox Laity , form filled out for disability  Orders: Orthopedic Surgeon Referral (Ortho Surgeon)  Problem # 4:  HYPERTENSION (ICD-401.9)  Her updated medication list for this problem includes:    Benazepril Hcl 10 Mg Tabs (Benazepril hcl) .Marland KitchenMarland KitchenMarland KitchenMarland Kitchen 1  by mouth once daily    Furosemide 40 Mg Tabs (Furosemide) .Marland Kitchen... 1/2 - 1 by mouth once daily  BP today: 108/58 Prior BP: 120/72 (04/28/2009)  Prior 10 Yr Risk Heart Disease: 15 % (01/03/2009)  Labs Reviewed: K+: 4.6 (06/04/2009) Creat: : 2.0 (06/04/2009)   Chol: 128 (06/04/2009)   HDL: 39.60 (06/04/2009)   LDL: 68 (06/04/2009)   TG: 100.0 (06/04/2009) stable overall by hx and exam, ok to continue meds/tx as is   Complete Medication List: 1)  Pravachol 40 Mg Tabs (Pravastatin sodium) .... Take 1 tablet by mouth once a day 2)  Omeprazole 20 Mg Cpdr (Omeprazole) .... 2 by mouth once daily 3)  Fosamax 70 Mg Tabs (Alendronate sodium) .... Take 1 tablet by mouth once a week 4)  Fluticasone Propionate 0.05 % Crea (Fluticasone propionate) .... As needed 5)  Vitamin D3 1000 Unit Tabs (Cholecalciferol) .Marland Kitchen.. 1 by mouth daily 6)  Cetirizine Hcl 10 Mg Tabs (Cetirizine hcl) .Marland Kitchen.. 1 by mouth once daily as needed 7)  Zolpidem Tartrate 5 Mg Tabs (Zolpidem tartrate) .Marland Kitchen.. 1 by mouth at bedtime as needed sleep 8)  Alprazolam 0.5 Mg Tabs (Alprazolam) .Marland Kitchen.. 1 by mouth two times a day as needed 9)  Hydrocodone-acetaminophen 5-325 Mg Tabs (Hydrocodone-acetaminophen) .Marland Kitchen.. 1 - 2 by mouth q 6 hrs as needed for pain 10)  Ferrex 150 150 Mg Caps (Polysaccharide iron complex) .Marland Kitchen.. 1 by mouth once daily 11)  Benazepril Hcl 10 Mg Tabs (Benazepril hcl) .Marland Kitchen.. 1 by mouth once daily 12)  Furosemide 40 Mg Tabs (Furosemide) .... 1/2 - 1 by mouth once  daily 13)  Proventil Hfa 108 (90 Base) Mcg/act Aers (Albuterol sulfate) .... 2 puffs four times per day as needed for shortness of breath 14)  Sertraline Hcl 50 Mg Tabs (Sertraline hcl) .Marland Kitchen.. 1po once daily 15)  Oxycodone Hcl 5 Mg Tabs (Oxycodone hcl) .Marland Kitchen.. 1 by mouth q 6 hrs as needed pain - to fill Jun 09, 2009  Patient Instructions: 1)  your form for disability was filled out 2)  Continue all previous medications as before this visit  3)  Please schedule a follow-up appointment in 6 months with: 4)  CBC w/ Diff prior to visit, ICD-9: 285.9 5)  BMP prior to visit, ICD-9: 588.9 6)  Hepatic Panel prior to visit, ICD-9: v58.69 7)  Lipid Panel prior to visit, ICD-9: 272.0 8)  iron panel  285.9 9)  b12/folate  285.9 Prescriptions: OXYCODONE HCL 5 MG TABS (OXYCODONE HCL) 1 by mouth q 6 hrs as needed pain - to fill Jun 09, 2009  #120 x 0   Entered and Authorized by:   Corwin Levins MD   Signed by:   Corwin Levins MD on 06/09/2009   Method used:   Print then Give to Patient   RxID:   [id number] ZOLPIDEM TARTRATE 5 MG TABS (ZOLPIDEM TARTRATE) 1 by mouth at bedtime as needed sleep  #30 x 5   Entered and Authorized by:   Corwin Levins MD   Signed by:   Corwin Levins MD on 06/09/2009   Method used:   Print then Give to Patient   RxID:   [id number]

## 2010-03-17 NOTE — Progress Notes (Signed)
Summary: Alendronate  Phone Note From Pharmacy   Summary of Call: I rec'd a fax from [employer] stating that the patient plan will only allow 4 tabs for every 28 days. Is it ok to change patient prescription? Initial call taken by: Lucious Groves,  March 21, 2009 3:51 PM  Follow-up for Phone Call        4 tabs of what? Follow-up by: Corwin Levins MD,  March 21, 2009 5:10 PM  Additional Follow-up for Phone Call Additional follow up Details #1::        Generic Fosamax. Additional Follow-up by: Lucious Groves,  March 24, 2009 8:34 AM    Additional Follow-up for Phone Call Additional follow up Details #2::    ok to change to #4 with 11 refills  thanks Follow-up by: Corwin Levins MD,  March 24, 2009 9:58 AM  Additional Follow-up for Phone Call Additional follow up Details #3:: Details for Additional Follow-up Action Taken: I did not receive rejected claim from pharmacy, closed phone note. Additional Follow-up by: Lucious Groves,  March 25, 2009 8:05 AM  Prescriptions: FOSAMAX 70 MG  TABS (ALENDRONATE SODIUM) Take 1 tablet by mouth once a week  #4 x 11   Entered by:   Lucious Groves   Authorized by:   Corwin Levins MD   Signed by:   Lucious Groves on 03/24/2009   Method used:   Electronically to        [facility] (retail)       [address]       [place], [place]  [address]       Ph: [phone]       Fax: [phone]   RxID:   [id number]

## 2010-03-17 NOTE — Letter (Signed)
[facility]   Imported By: Sherian Rein 04/17/2009 08:05:45  _____________________________________________________________________  External Attachment:    Type:   Image     Comment:   External Document

## 2010-03-17 NOTE — Medication Information (Signed)
Summary: [facility] [facility]  [facility]/[employer] [facility]   Imported By: Lester  04/05/2009 09:24:52  _____________________________________________________________________  External Attachment:    Type:   Image     Comment:   External Document

## 2010-03-17 NOTE — Consult Note (Signed)
Summary: [facility]   Imported By: Lester [place] 06/18/2009 08:51:30  _____________________________________________________________________  External Attachment:    Type:   Image     Comment:   External Document

## 2010-03-17 NOTE — Assessment & Plan Note (Signed)
Summary: NP follow up - COPD   Primary Shaneta Cervenka/Referring Petula Rotolo:  Corwin Levins MD  CC:  6 month COPD follow up - sinus pressure/congestion with light yellow mucus, PND, and prod cough with light yellow mucus x2 d.  History of Present Illness: 70/F, ex smoker for FU of COPD 5/10 s/p LUL obectomy for stg I a squamous cell CA She smoked 42yrs until 2008. Pre-op FEV1 was 1.53 (80%) & DLCO 48% CT 03/24/09 no recurrence Off Spiriva since 2/11 since very expensive.  Jun 30, 2009 1:43 PM  only needed rescue inhaler once. Rpt PFTs >> FEv1 66%, FVC 63%, DLCO 44%    January 12, 2010 --Presents for 6 month COPD follow up - sinus pressure/congestion with light yellow mucus, PND, prod cough with light yellow mucus x2days - denies SOB, wheezing. Denies chest pain, dyspnea, orthopnea, hemoptysis, fever, n/v/d, edema, headache,recent travel or antibiotics.  She had  CT in 8/11 showed no reoccurrence of lung cancer. Has follow up CT scheduled 2/12 w/ follow up w/ Dr. Gwenyth Bouillon. She uses proventil rarely.   Medications Prior to Update: 1)  Pravachol 40 Mg Tabs (Pravastatin Sodium) .... Take 1 Tablet By Mouth Once A Day 2)  Omeprazole 20 Mg  Cpdr (Omeprazole) .... 2 By Mouth Once Daily 3)  Fosamax 70 Mg  Tabs (Alendronate Sodium) .... Take 1 Tablet By Mouth Once A Week 4)  Vitamin D3 1000 Unit  Tabs (Cholecalciferol) .Marland Kitchen.. 1 By Mouth Daily 5)  Zolpidem Tartrate 5 Mg Tabs (Zolpidem Tartrate) .Marland Kitchen.. 1 By Mouth At Bedtime As Needed Sleep 6)  Ferrex 150 150 Mg Caps (Polysaccharide Iron Complex) .... 2 By Mouth Once Daily 7)  Furosemide 40 Mg Tabs (Furosemide) .... 1/2 - 1 By Mouth Once Daily 8)  Proventil Hfa 108 (90 Base) Mcg/act Aers (Albuterol Sulfate) .... 2 Puffs Four Times Per Day As Needed For Shortness of Breath 9)  Oxycodone Hcl 5 Mg Tabs (Oxycodone Hcl) .Marland Kitchen.. 1 By Mouth Q 6 Hrs As Needed Pain - To Fill Fill Dec 22, 2009 10)  Alprazolam 0.5 Mg Tabs (Alprazolam) .Marland Kitchen.. 1 Tab Two Times A Day As  Needed  Current Medications (verified): 1)  Pravachol 40 Mg Tabs (Pravastatin Sodium) .... Take 1 Tablet By Mouth Once A Day 2)  Omeprazole 20 Mg  Cpdr (Omeprazole) .... 2 By Mouth Once Daily 3)  Fosamax 70 Mg  Tabs (Alendronate Sodium) .... Take 1 Tablet By Mouth Once A Week 4)  Vitamin D3 1000 Unit  Tabs (Cholecalciferol) .Marland Kitchen.. 1 By Mouth Daily 5)  Zolpidem Tartrate 5 Mg Tabs (Zolpidem Tartrate) .Marland Kitchen.. 1 By Mouth At Bedtime As Needed Sleep 6)  Ferrex 150 150 Mg Caps (Polysaccharide Iron Complex) .... 2 By Mouth Once Daily 7)  Furosemide 40 Mg Tabs (Furosemide) .... 1/2 - 1 By Mouth Once Daily 8)  Proventil Hfa 108 (90 Base) Mcg/act Aers (Albuterol Sulfate) .... 2 Puffs Four Times Per Day As Needed For Shortness of Breath 9)  Oxycodone Hcl 5 Mg Tabs (Oxycodone Hcl) .Marland Kitchen.. 1 By Mouth Q 6 Hrs As Needed Pain - To Fill Fill Dec 22, 2009 10)  Alprazolam 0.5 Mg Tabs (Alprazolam) .Marland Kitchen.. 1 Tab Two Times A Day As Needed  Allergies (verified): 1)  ! Pcn 2)  ! Amoxicillin 3)  ! Codeine 4)  ! 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 Left upper lobe lesion obstructing the left upper lobe   orifice, probable malignancy until proven otherwise.   Tumor removed from lung 08/06/08  Family History: Last updated: 07/01/2008 Family History High cholesterol Family History Hypertension colon cancer Mother-deceased ? cause when patient was 22 years old Father- deceased ?  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 Smoker-smoked for 45-50 pack years, stopped 2008 2 children widow  Risk Factors: Smoking Status: quit > 6 months (01/13/2009) Packs/Day: 1 PPD (01/13/2009)  Review of Systems      See HPI  Vital Signs:  Patient profile:   70 year old female Height:      62 inches Weight:      172.50 pounds BMI:     31.66 O2 Sat:      97 % on Room air Temp:     98.4 degrees F oral Pulse rate:   74 / minute BP sitting:   140 / 62  (left arm) Cuff size:   regular  Vitals Entered By: Boone Master CNA/MA (January 12, 2010 9:56 AM)  O2 Flow:  Room air CC: 6 month COPD follow up - sinus pressure/congestion with light yellow mucus, PND, prod cough with light yellow mucus x2 d Is Patient Diabetic? No Comments Medications reviewed with patient Daytime contact number verified with patient. Boone Master CNA/MA  January 12, 2010 9:56 AM    Physical Exam  Additional Exam:  Gen. Pleasant, well-nourished, in no distress ENT - no lesions, no post nasal drip Neck: No JVD, no thyromegaly, no carotid bruits Lungs: no use of accessory muscles, no dullness to percussion, clear without rales or rhonchi  Cardiovascular: Rhythm regular, heart sounds  normal, no murmurs or gallops, no peripheral edema Musculoskeletal: No deformities, no cyanosis or clubbing      Impression & Recommendations:  Problem # 1:  C O P D (ICD-496) compensated at present time.  cont on as needed proventil  has mild uri -suspect is viral , may use nasonex and delsym as needed  Please contact office for sooner follow up if symptoms do not improve or worsen  follow up  6 month Dr. Vassie Loll   Problem # 2:  CARCINOMA, LUNG, SQUAMOUS CELL (ICD-162.9) 5/10 s/p LUL obectomy for stg I a squamous cell CA recent CT 8/11 showed no  reoccurence  has scheduled follow up in 2/11 for CT w/ Dr. Gwenyth Bouillon.   Complete Medication List: 1)  Pravachol 40 Mg Tabs (Pravastatin sodium) .... Take 1 tablet by mouth once a day 2)  Omeprazole 20 Mg Cpdr (Omeprazole) .... 2 by mouth once daily 3)  Fosamax 70 Mg Tabs (Alendronate sodium) .... Take 1 tablet by mouth once a week 4)  Vitamin D3 1000 Unit Tabs (Cholecalciferol) .Marland Kitchen.. 1 by mouth daily 5)  Zolpidem Tartrate 5 Mg Tabs (Zolpidem tartrate) .Marland Kitchen.. 1 by mouth at bedtime as needed sleep 6)  Ferrex 150 150 Mg Caps (Polysaccharide iron complex) .... 2 by mouth once daily 7)  Furosemide 40 Mg Tabs (Furosemide) .... 1/2 - 1 by mouth once daily 8)  Proventil Hfa 108 (90 Base) Mcg/act  Aers (Albuterol sulfate) .... 2 puffs four times per day as needed for shortness of breath 9)  Oxycodone Hcl 5 Mg Tabs (Oxycodone hcl) .Marland Kitchen.. 1 by mouth q 6 hrs as needed pain - to fill fill Dec 22, 2009 10)  Alprazolam 0.5 Mg Tabs (Alprazolam) .Marland Kitchen.. 1 tab two times a day as needed  Other Orders: Est. Patient Level III ([address])  Patient Instructions: 1)  follow up Dr. Vassie Loll in 6 months  2)  follow up Dr. Gwenyth Bouillon for CT as planned.  3)  May use delsym 1 tsp every 12 hr as needed for cough 4)  Saline gel as needed for nasal congestion  5)  Nasonex 2 puffs two times a day until sample is gone.  6)  Please contact office for sooner follow up if symptoms do not improve or worsen

## 2010-03-17 NOTE — Progress Notes (Signed)
Summary: ENT referral  Phone Note Call from Patient Call back at Home Phone [phone]   Summary of Call: Patient left message on triage that she needs referral to an ENT for nosebleeds, please advise. Initial call taken by: Lucious Groves,  May 05, 2009 9:31 AM  Follow-up for Phone Call        ok - will do Follow-up by: Corwin Levins MD,  May 05, 2009 1:08 PM  Additional Follow-up for Phone Call Additional follow up Details #1::        I made patient aware earlier today to await call from [facility]. Additional Follow-up by: Lucious Groves,  May 05, 2009 3:16 PM  New Problems: EPISTAXIS, RECURRENT (ICD-784.7)   New Problems: EPISTAXIS, RECURRENT (ICD-784.7)

## 2010-03-17 NOTE — Letter (Signed)
Summary: Outpatient Coinsurance Notice  Outpatient Coinsurance Notice   Imported By: Marylou Mccoy 11/26/2009 13:35:00  _____________________________________________________________________  External Attachment:    Type:   Image     Comment:   External Document

## 2010-03-17 NOTE — Progress Notes (Signed)
Summary: Medication refill  Phone Note Refill Request Message from:  Fax from Pharmacy on November 17, 2009 10:56 AM  Refills Requested: Medication #1:  ZOLPIDEM TARTRATE 5 MG TABS 1 by mouth at bedtime as needed sleep   Dosage confirmed as above?Dosage Confirmed   Last Refilled: 06/09/2009   Notes: [facility], fax#[phone] Initial call taken by: Robin Ewing CMA (AAMA),  November 17, 2009 10:56 AM    New/Updated Medications: ZOLPIDEM TARTRATE 5 MG TABS (ZOLPIDEM TARTRATE) 1 by mouth at bedtime as needed sleep Prescriptions: ZOLPIDEM TARTRATE 5 MG TABS (ZOLPIDEM TARTRATE) 1 by mouth at bedtime as needed sleep  #30 x 5   Entered and Authorized by:   Corwin Levins MD   Signed by:   Corwin Levins MD on 11/17/2009   Method used:   Print then Give to Patient   RxID:   [id number]   done hardcopy to LIM side B - dahlia Corwin Levins MD  November 17, 2009 1:09 PM  Rx faxed to pharmacy Margaret Pyle, CMA  November 17, 2009 1:14 PM

## 2010-03-17 NOTE — Assessment & Plan Note (Signed)
Summary: rov/ mbw   Visit Type:  Follow-up Primary Provider/Referring Provider:  Corwin Levins MD  CC:  Pt here for follow up with PFT. Pt states breathing is better and has no complaints.  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 Pre-op FEV1 was 1.53 (80%) & DLCO 48% CT 03/24/09 no recurrence Off Spiriva since 2/11 since very expensive.  Jun 30, 2009 1:43 PM  only needed rescue inhaler once. Rpt PFTs >> FEv1 66%, FVC 63%, DLCO 44%  The patient complains of shortness of breath, but denies chest tightness, chest pain worse with breathing and coughing, wheezing, cough, mucous production, nocturnal awakening, exercise induced symptoms, and congestion.  Current Medications (verified): 1)  Pravachol 40 Mg Tabs (Pravastatin Sodium) .... Take 1 Tablet By Mouth Once A Day 2)  Omeprazole 20 Mg  Cpdr (Omeprazole) .... 2 By Mouth Once Daily 3)  Fosamax 70 Mg  Tabs (Alendronate Sodium) .... Take 1 Tablet By Mouth Once A Week 4)  Fluticasone Propionate 0.05 %  Crea (Fluticasone Propionate) .... As Needed 5)  Vitamin D3 1000 Unit  Tabs (Cholecalciferol) .Marland Kitchen.. 1 By Mouth Daily 6)  Zolpidem Tartrate 5 Mg Tabs (Zolpidem Tartrate) .Marland Kitchen.. 1 By Mouth At Bedtime As Needed Sleep 7)  Alprazolam 0.5 Mg Tabs (Alprazolam) .Marland Kitchen.. 1 By Mouth Two Times A Day As Needed 8)  Ferrex 150 150 Mg Caps (Polysaccharide Iron Complex) .Marland Kitchen.. 1 By Mouth Once Daily 9)  Benazepril Hcl 10 Mg Tabs (Benazepril Hcl) .Marland Kitchen.. 1 By Mouth Once Daily 10)  Furosemide 40 Mg Tabs (Furosemide) .... 1/2 - 1 By Mouth Once Daily 11)  Proventil Hfa 108 (90 Base) Mcg/act Aers (Albuterol Sulfate) .... 2 Puffs Four Times Per Day As Needed For Shortness of Breath 12)  Sertraline Hcl 50 Mg Tabs (Sertraline Hcl) .Marland Kitchen.. 1po Once Daily 13)  Oxycodone Hcl 5 Mg Tabs (Oxycodone Hcl) .Marland Kitchen.. 1 By Mouth Q 6 Hrs As Needed Pain - To Fill Jun 09, 2009  Allergies  (verified): 1)  ! Pcn 2)  ! Amoxicillin 3)  ! Codeine 4)  ! 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  The patient denies anorexia, fever, weight loss, weight gain, vision loss, decreased hearing, hoarseness, chest pain, syncope, peripheral edema, prolonged cough, headaches, hemoptysis, abdominal pain, melena, hematochezia, severe indigestion/heartburn, hematuria, muscle weakness, suspicious skin lesions, difficulty walking, depression, unusual weight change, and abnormal bleeding.    Vital Signs:  Patient profile:   70 year old female Height:      62 inches Weight:      165 pounds O2 Sat:      96 % on Room air Temp:     98.4 degrees F oral Pulse rate:   72 / minute BP sitting:   126 / 60  (right arm) Cuff size:  regular  Vitals Entered By: Zackery Barefoot CMA (Jun 30, 2009 1:34 PM)  O2 Flow:  Room air CC: Pt here for follow up with PFT. Pt states breathing is better and has no complaints Comments Medications reviewed with patient Verified contact number and pharmacy with patient Zackery Barefoot CMA  Jun 30, 2009 1:34 PM    Physical Exam  Additional Exam:  Gen. Pleasant, well-nourished, in no distress ENT - no lesions, no post nasal drip Neck: No JVD, no thyromegaly, no carotid bruits Lungs: no use of  accessory muscles, no dullness to percussion, clear without rales or rhonchi  Cardiovascular: Rhythm regular, heart sounds  normal, no murmurs or gallops, no peripheral edema Musculoskeletal: No deformities, no cyanosis or clubbing      Impression & Recommendations:  Problem # 1:  C O P D (ICD-496) -stable ct albuterol MDI as needed   Problem # 2:  CARCINOMA, LUNG, SQUAMOUS CELL (ICD-162.9) rpt CT planned n aug 2011  Other Orders: Est. Patient Level III (66440)  Patient Instructions: 1)  Copy sent to: Dr Shirline Frees, Dr Jonny Ruiz 2)  Please schedule a follow-up appointment in 6 months with TP. 3)  Dr Shirline Frees will follow up on your CT scan results

## 2010-03-17 NOTE — Assessment & Plan Note (Signed)
Summary: PER DAHLIA SCHED--STC   Vital Signs:  Patient profile:   70 year old female Height:      62 inches Weight:      163 pounds BMI:     29.92 O2 Sat:      98 % on Room air Temp:     98.6 degrees F oral Pulse rate:   86 / minute BP sitting:   120 / 72  (left arm) Cuff size:   regular  Vitals Entered ByZella Ball Ewing (April 28, 2009 11:35 AM)  O2 Flow:  Room air CC: Discuss pain medication/RE   Primary Care Provider:  Corwin Levins MD  CC:  Discuss pain medication/RE.  History of Present Illness: hydrocodone causes  nausea;  tramadol not effective;  lower back pain , left knee and left foot pain now improved, but still with bilat neck pain with shooting pains to the right shoulder and left upper arm occasoinally mod to severe; no parethesieas or weakness to the extremities, falls or injury, or fever, wt loss, night sweats or bowel or bladder change.  Also with worsening depression symptoms with increased low mood, tearful, fatigue, sleeps more and more nervous and irritable, escpecially with pain flares;  no suicidal ideation or panic;  currently living alone, but here with daughter concerned as well.  Pt denies CP, sob, doe, wheezing, orthopnea, pnd, worsening LE edema, palps, dizziness or syncope Pt denies new neuro symptoms such as headache, facial or extremity weakness    Problems Prior to Update: 1)  Fatigue  (ICD-780.79) 2)  Leg Pain, Bilateral  (ICD-729.5) 3)  Plantar Fasciitis, Left  (ICD-728.71) 4)  Bursitis, Left Hip  (ICD-726.5) 5)  Back Pain  (ICD-724.5) 6)  Peripheral Edema  (ICD-782.3) 7)  Cellulitis, Leg, Left  (ICD-682.6) 8)  Back Pain  (ICD-724.5) 9)  Spondylosis, Cervical, With Radiculopathy  (ICD-723.4) 10)  Neck Pain, Acute  (ICD-723.1) 11)  Aortic Stenosis/ Insufficiency, Non-rheumatic  (ICD-424.1) 12)  Stricture and Stenosis of Esophagus  (ICD-530.3) 13)  Barretts Esophagus  (ICD-530.85) 14)  Pruritus  (ICD-698.9) 15)  Uti  (ICD-599.0) 16)   Anemia-iron Deficiency  (ICD-280.9) 17)  Pre-operative Cardiac Exam  (ICD-V72.81) 18)  Pre-operative Cardiovascular Examination  (ICD-V72.81) 19)  C O P D  (ICD-496) 20)  Carcinoma, Lung, Squamous Cell  (ICD-162.9) 21)  Anxiety  (ICD-300.00) 22)  Insomnia-sleep Disorder-unspec  (ICD-780.52) 23)  Abnormal Chest Xray  (ICD-793.1) 24)  Pneumonia  (ICD-486) 25)  Sinusitis- Acute-nos  (ICD-461.9) 26)  Preventive Health Care  (ICD-V70.0) 27)  Anemia of Other Chronic Disease  (ICD-285.29) 28)  Hyperkalemia  (ICD-276.7) 29)  Renal Insufficiency  (ICD-588.9) 30)  Sinusitis, Chronic  (ICD-473.9) 31)  Frequency, Urinary  (ICD-788.41) 32)  Sinusitis- Acute-nos  (ICD-461.9) 33)  Pneumonia  (ICD-486) 34)  Foot Pain, Right  (ICD-729.5) 35)  Preventive Health Care  (ICD-V70.0) 36)  Dyspepsia  (ICD-536.8) 37)  Dysphagia Unspecified  (ICD-787.20) 38)  Abdominal Pain, Generalized  (ICD-789.07) 39)  Uti  (ICD-599.0) 40)  Osteoarthritis, Knee, Left  (ICD-715.96) 41)  Positive Ppd  (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 Take 1 Tablet By Mouth Once A Day  2)  Omeprazole 20 Mg  Cpdr (Omeprazole) .... 2 By Mouth Once Daily 3)  Fosamax 70 Mg  Tabs (Alendronate Sodium) .... Take 1 Tablet By Mouth Once A Week 4)  Bentyl 20 Mg  Tabs (Dicyclomine Hcl) .Marland Kitchen.. 1 By Mouth Two Times A Day 5)  Fluticasone Propionate 0.05 %  Crea (Fluticasone Propionate) .... As Needed 6)  Vitamin D3 1000 Unit  Tabs (Cholecalciferol) .Marland Kitchen.. 1 By Mouth Daily 7)  Cetirizine Hcl 10 Mg Tabs (Cetirizine Hcl) .Marland Kitchen.. 1 By Mouth Once Daily As Needed 8)  Zolpidem Tartrate 5 Mg Tabs  (Zolpidem Tartrate) .Marland Kitchen.. 1 By Mouth At Bedtime As Needed Sleep 9)  Alprazolam 0.25 Mg Tabs (Alprazolam) .Marland Kitchen.. 1 By Mouth Two Times A Day As Needed 10)  Flonase 50 Mcg/act  Susp (Fluticasone Propionate) .... Two Puffs Each Nostril Daily 11)  Hydrocodone-Acetaminophen 5-325 Mg Tabs (Hydrocodone-Acetaminophen) .Marland Kitchen.. 1 - 2 By Mouth Q 6 Hrs As Needed For Pain 12)  Ferrex 150 150 Mg Caps (Polysaccharide Iron Complex) .Marland Kitchen.. 1 By Mouth Once Daily 13)  Flexeril 5 Mg Tabs (Cyclobenzaprine Hcl) .Marland Kitchen.. 1po Three Times A Day As Needed 14)  Benazepril Hcl 10 Mg Tabs (Benazepril Hcl) .Marland Kitchen.. 1 By Mouth Once Daily 15)  Furosemide 40 Mg Tabs (Furosemide) .... 1/2 - 1 By Mouth Once Daily 16)  Proventil Hfa 108 (90 Base) Mcg/act Aers (Albuterol Sulfate) .... 2 Puffs Four Times Per Day As Needed For Shortness of Breath 17)  Prednisone 10 Mg Tabs (Prednisone) .... 3po Qd For 3days, Then 2po Qd For 3days, Then 1po Qd For 3days, Then Stop  Current Medications (verified): 1)  Pravachol 40 Mg Tabs (Pravastatin Sodium) .... Take 1 Tablet By Mouth Once A Day 2)  Omeprazole 20 Mg  Cpdr (Omeprazole) .... 2 By Mouth Once Daily 3)  Fosamax 70 Mg  Tabs (Alendronate Sodium) .... Take 1 Tablet By Mouth Once A Week 4)  Fluticasone Propionate 0.05 %  Crea (Fluticasone Propionate) .... As Needed 5)  Vitamin D3 1000 Unit  Tabs (Cholecalciferol) .Marland Kitchen.. 1 By Mouth Daily 6)  Cetirizine Hcl 10 Mg Tabs (Cetirizine Hcl) .Marland Kitchen.. 1 By Mouth Once Daily As Needed 7)  Zolpidem Tartrate 5 Mg Tabs (Zolpidem Tartrate) .Marland Kitchen.. 1 By Mouth At Bedtime As Needed Sleep 8)  Alprazolam 0.5 Mg Tabs (Alprazolam) .Marland Kitchen.. 1 By Mouth Two Times A Day As Needed 9)  Flonase 50 Mcg/act  Susp (Fluticasone Propionate) .... Two Puffs Each Nostril Daily 10)  Hydrocodone-Acetaminophen 5-325 Mg Tabs (Hydrocodone-Acetaminophen) .Marland Kitchen.. 1 - 2 By Mouth Q 6 Hrs As Needed For Pain 11)  Ferrex 150 150 Mg Caps (Polysaccharide Iron Complex) .Marland Kitchen.. 1 By Mouth Once Daily 12)  Benazepril Hcl 10 Mg  Tabs (Benazepril Hcl) .Marland Kitchen.. 1 By Mouth Once Daily 13)  Furosemide 40 Mg Tabs (Furosemide) .... 1/2 - 1 By Mouth Once Daily 14)  Proventil Hfa 108 (90 Base) Mcg/act Aers (Albuterol Sulfate) .... 2 Puffs Four Times Per Day As Needed For Shortness of Breath 15)  Sertraline Hcl 50 Mg Tabs (Sertraline Hcl) .Marland Kitchen.. 1po Once Daily 16)  Oxycodone Hcl 5 Mg Tabs (Oxycodone Hcl) .Marland Kitchen.. 1 By Mouth Q 6 Hrs As Needed Pain  Allergies (verified): 1)  ! Pcn 2)  ! Amoxicillin 3)  ! Codeine 4)  ! Hydrocodone 5)  * Fentanyl  Past History:  Past Surgical History: Last updated: 11/14/2008 Hysterectomy Tubal ligation Pentax video bronchoscope...06/20/2008... Left upper lobe lesion obstructing the left upper lobe   orifice, probable malignancy until proven otherwise.  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   Head:  normocephalic and atraumatic.   Eyes:  vision grossly intact, pupils equal, and pupils round.   Ears:  R ear normal and L ear normal.   Nose:  no external deformity and no nasal discharge.   Mouth:  no gingival abnormalities and pharynx pink and moist.   Neck:  supple and no  masses.   Lungs:  normal respiratory effort and normal breath sounds.   Heart:  normal rate and regular rhythm.   Msk:  spine nontender and no paravertebral spasm Extremities:  no edema, no erythema  Neurologic:  cranial nerves II-XII intact, strength normal in all extremities, and sensation intact to light touch.     Impression & Recommendations:  Problem # 1:  CERVICALGIA (ICD-723.1)  The following medications were removed from the medication list:    Flexeril 5 Mg Tabs (Cyclobenzaprine hcl) .Marland Kitchen... 1po three times a day as needed Her updated medication list for this problem includes:    Hydrocodone-acetaminophen 5-325 Mg Tabs (Hydrocodone-acetaminophen) .Marland Kitchen... 1 - 2 by mouth q 6 hrs as needed for pain    Oxycodone Hcl 5 Mg Tabs (Oxycodone hcl) .Marland Kitchen... 1 by mouth q 6 hrs as needed pain suspect ongoing c-spine DDD/DJD - treat as above, f/u any worsening signs or symptoms , exam o/w benign, decline films today, consider MRI if signs worsen such as extremity weakness or numbness  Problem # 2:  DEPRESSION (ICD-311)  Her updated medication list for this problem includes:    Alprazolam 0.5 Mg Tabs (Alprazolam) .Marland Kitchen... 1 by mouth two times a day as needed    Sertraline Hcl 50 Mg Tabs (Sertraline hcl) .Marland Kitchen... 1po once daily treat as above, f/u any worsening signs or symptoms   Problem # 3:  HYPERTENSION (ICD-401.9)  Her updated medication list for this problem includes:    Benazepril Hcl 10 Mg Tabs (Benazepril hcl) .Marland Kitchen... 1 by mouth once daily    Furosemide 40 Mg Tabs (Furosemide) .Marland Kitchen... 1/2 - 1 by mouth once daily  BP today: 120/72 Prior BP: 140/72 (04/18/2009)  Prior 10 Yr Risk Heart Disease: 15 % (01/03/2009)  Labs Reviewed: K+: 5.2 (04/18/2009) Creat: : 1.6 (04/18/2009)   Chol: 126 (01/29/2008)   HDL: 26.7 (01/29/2008)   LDL: 81 (01/29/2008)   TG: 90 (01/29/2008) stable overall by hx and exam, ok to continue meds/tx as is   Complete Medication List: 1)  Pravachol 40 Mg Tabs (Pravastatin  sodium) .... Take 1 tablet by mouth once a day 2)  Omeprazole 20 Mg Cpdr (Omeprazole) .... 2 by mouth once daily 3)  Fosamax 70 Mg Tabs (Alendronate sodium) .... Take 1 tablet by mouth once a week 4)  Fluticasone Propionate 0.05 % Crea (Fluticasone propionate) .... As needed  5)  Vitamin D3 1000 Unit Tabs (Cholecalciferol) .Marland Kitchen.. 1 by mouth daily 6)  Cetirizine Hcl 10 Mg Tabs (Cetirizine hcl) .Marland Kitchen.. 1 by mouth once daily as needed 7)  Zolpidem Tartrate 5 Mg Tabs (Zolpidem tartrate) .Marland Kitchen.. 1 by mouth at bedtime as needed sleep 8)  Alprazolam 0.5 Mg Tabs (Alprazolam) .Marland Kitchen.. 1 by mouth two times a day as needed 9)  Flonase 50 Mcg/act Susp (Fluticasone propionate) .... Two puffs each nostril daily 10)  Hydrocodone-acetaminophen 5-325 Mg Tabs (Hydrocodone-acetaminophen) .Marland Kitchen.. 1 - 2 by mouth q 6 hrs as needed for pain 11)  Ferrex 150 150 Mg Caps (Polysaccharide iron complex) .Marland Kitchen.. 1 by mouth once daily 12)  Benazepril Hcl 10 Mg Tabs (Benazepril hcl) .Marland Kitchen.. 1 by mouth once daily 13)  Furosemide 40 Mg Tabs (Furosemide) .... 1/2 - 1 by mouth once daily 14)  Proventil Hfa 108 (90 Base) Mcg/act Aers (Albuterol sulfate) .... 2 puffs four times per day as needed for shortness of breath 15)  Sertraline Hcl 50 Mg Tabs (Sertraline hcl) .Marland Kitchen.. 1po once daily 16)  Oxycodone Hcl 5 Mg Tabs (Oxycodone hcl) .Marland Kitchen.. 1 by mouth q 6 hrs as needed pain  Patient Instructions: 1)  your alprazolam was increased today 2)  start the sertraline at 50 mg per day 3)  stop the hydrocodone, and the bentyl, and the iron, and the flexeril for now 4)  start the oxycodone as needed pain, and call if you need refills 5)  Please schedule a follow-up appointment in 6 months with CPX labs Prescriptions: OXYCODONE HCL 5 MG TABS (OXYCODONE HCL) 1 by mouth q 6 hrs as needed pain  #120 x 0   Entered and Authorized by:   Corwin Levins MD   Signed by:   Corwin Levins MD on 04/28/2009   Method used:   Print then Give to Patient   RxID:    [id number] SERTRALINE HCL 50 MG TABS (SERTRALINE HCL) 1po once daily  #30 x 11   Entered and Authorized by:   Corwin Levins MD   Signed by:   Corwin Levins MD on 04/28/2009   Method used:   Print then Give to Patient   RxID:   [id number] ALPRAZOLAM 0.5 MG TABS (ALPRAZOLAM) 1 by mouth two times a day as needed  #60 x 2   Entered and Authorized by:   Corwin Levins MD   Signed by:   Corwin Levins MD on 04/28/2009   Method used:   Print then Give to Patient   RxID:   [id number]

## 2010-03-17 NOTE — Medication Information (Signed)
Summary: [facility]  [facility]   Imported By: Lester [place] 04/07/2009 10:08:38  _____________________________________________________________________  External Attachment:    Type:   Image     Comment:   External Document

## 2010-03-19 ENCOUNTER — Encounter: Payer: Self-pay | Admitting: Internal Medicine

## 2010-03-19 ENCOUNTER — Ambulatory Visit (HOSPITAL_COMMUNITY)
Admission: RE | Admit: 2010-03-19 | Discharge: 2010-03-19 | Disposition: A | Discharge status: Home / Self Care | Payer: Medicare Other | Source: Ambulatory Visit | Attending: Internal Medicine | Admitting: Internal Medicine

## 2010-03-19 ENCOUNTER — Encounter (HOSPITAL_COMMUNITY): Payer: Self-pay

## 2010-03-19 ENCOUNTER — Ambulatory Visit: Payer: PRIVATE HEALTH INSURANCE | Admitting: Internal Medicine

## 2010-03-19 DIAGNOSIS — I2510 Atherosclerotic heart disease of native coronary artery without angina pectoris: Secondary | ICD-10-CM | POA: Insufficient documentation

## 2010-03-19 DIAGNOSIS — K8020 Calculus of gallbladder without cholecystitis without obstruction: Secondary | ICD-10-CM | POA: Insufficient documentation

## 2010-03-19 DIAGNOSIS — C3410 Malignant neoplasm of upper lobe, unspecified bronchus or lung: Secondary | ICD-10-CM

## 2010-03-19 DIAGNOSIS — J9819 Other pulmonary collapse: Secondary | ICD-10-CM | POA: Insufficient documentation

## 2010-03-19 DIAGNOSIS — C3490 Malignant neoplasm of unspecified part of unspecified bronchus or lung: Secondary | ICD-10-CM | POA: Insufficient documentation

## 2010-03-19 DIAGNOSIS — N200 Calculus of kidney: Secondary | ICD-10-CM | POA: Insufficient documentation

## 2010-03-19 HISTORY — DX: Malignant (primary) neoplasm, unspecified: C80.1

## 2010-03-19 LAB — CBC WITH DIFFERENTIAL/PLATELET
Eosinophils Absolute: 0.1 10*3/uL (ref 0.0–0.5)
HGB: 11 g/dL — ABNORMAL LOW (ref 11.6–15.9)
LYMPH%: 29.7 % (ref 14.0–49.7)
MONO#: 0.4 10*3/uL (ref 0.1–0.9)
NEUT#: 2.3 10*3/uL (ref 1.5–6.5)
Platelets: 207 10*3/uL (ref 145–400)
RBC: 3.52 10*6/uL — ABNORMAL LOW (ref 3.70–5.45)
RDW: 13.9 % (ref 11.2–14.5)
WBC: 4.1 10*3/uL (ref 3.9–10.3)
nRBC: 0 % (ref 0–0)

## 2010-03-19 LAB — CMP (CANCER CENTER ONLY)
ALT(SGPT): 15 U/L (ref 10–47)
CO2: 28 mEq/L (ref 18–33)
Calcium: 8.6 mg/dL (ref 8.0–10.3)
Chloride: 102 mEq/L (ref 98–108)
Sodium: 142 mEq/L (ref 128–145)
Total Protein: 7 g/dL (ref 6.4–8.1)

## 2010-03-19 NOTE — Progress Notes (Signed)
Summary: congestion, cough  Phone Note Call from Patient Call back at Home Phone [phone]   Caller: Patient Call For: alva Summary of Call: Pt c/o congestion, coughing up green phlegm since last Monday, delsym and mucinex not working, requests antibiotic pls advise.//[facility] [address] Initial call taken by: Darletta Moll,  February 23, 2010 9:19 AM  Follow-up for Phone Call        Spoke with pt.  She is c/o prod cough with green/yellow sputum x 1 wk- taking mucinex but seems to just be getting worse.  Also c/o facial/sinus pressure.  Would like abx called in.  RA out of the office so will forward to doc of the day.  Dr Delford Field, pls advise, thanks! allergic to PCN and amoxcicillin Follow-up by: Vernie Murders,  February 23, 2010 10:41 AM  Additional Follow-up for Phone Call Additional follow up Details #1::        needs an OV  Additional Follow-up by: Storm Frisk MD,  February 23, 2010 11:06 AM    Additional Follow-up for Phone Call Additional follow up Details #2::    OV with RB at 2:30 pm Vernie Murders  February 23, 2010 11:11 AM

## 2010-03-19 NOTE — Progress Notes (Signed)
  Phone Note Call from Patient Call back at [facility] Phone [phone]   Summary of Call: Pt left vm that she needs refills of 2 meds, did not give name of pharm and req a call back.  Initial call taken by: Lamar Sprinkles, CMA,  February 24, 2010 9:11 AM  Follow-up for Phone Call        called pt and she is requesting Furosemide and Pravastatin to be refilled, she never got refills sent in Oct. I called [facility] to inform to refill pts. Furosemide and Pravastatin sent in Oct. 2011. Follow-up by: Zella Ball Ewing CMA Duncan Dull),  February 24, 2010 10:16 AM

## 2010-03-19 NOTE — Progress Notes (Signed)
Summary: medication refill  Phone Note Call from Patient   Caller: Patient Call For: Corwin Levins MD Summary of Call: Patient is requesting a refill on her Oxycodone. Initial call taken by: Robin Ewing CMA Duncan Dull),  February 24, 2010 10:17 AM  Follow-up for Phone Call        done hardcopy to LIM side B - dahlia  Follow-up by: Corwin Levins MD,  February 24, 2010 10:23 AM  Additional Follow-up for Phone Call Additional follow up Details #1::        Called pt. to inform prescriptions requested are ready for pickup at front desk. Additional Follow-up by: Robin Ewing CMA (AAMA),  February 24, 2010 10:28 AM    New/Updated Medications: OXYCODONE HCL 5 MG TABS (OXYCODONE HCL) 1 by mouth q 6 hrs as needed pain - to fill  Feb 24, 2010 OXYCODONE HCL 5 MG TABS (OXYCODONE HCL) 1 by mouth q 6 hrs as needed pain - to fill  Mar 26, 2010 OXYCODONE HCL 5 MG TABS (OXYCODONE HCL) 1 by mouth q 6 hrs as needed pain - to fill  Apr 22, 2010 Prescriptions: OXYCODONE HCL 5 MG TABS (OXYCODONE HCL) 1 by mouth q 6 hrs as needed pain - to fill  Apr 22, 2010  #120 x 0   Entered and Authorized by:   Corwin Levins MD   Signed by:   Corwin Levins MD on 02/24/2010   Method used:   Print then Give to Patient   RxID:   [id number] OXYCODONE HCL 5 MG TABS (OXYCODONE HCL) 1 by mouth q 6 hrs as needed pain - to fill  Mar 26, 2010  #120 x 0   Entered and Authorized by:   Corwin Levins MD   Signed by:   Corwin Levins MD on 02/24/2010   Method used:   Print then Give to Patient   RxID:   [id number] OXYCODONE HCL 5 MG TABS (OXYCODONE HCL) 1 by mouth q 6 hrs as needed pain - to fill  Feb 24, 2010  #120 x 0   Entered and Authorized by:   Corwin Levins MD   Signed by:   Corwin Levins MD on 02/24/2010   Method used:   Print then Give to Patient   RxID:   [id number]

## 2010-03-19 NOTE — Letter (Signed)
Summary: [facility]  [facility]   Imported By: Lester [place] 01/30/2010 10:27:37  _____________________________________________________________________  External Attachment:    Type:   Image     Comment:   External Document

## 2010-03-19 NOTE — Assessment & Plan Note (Signed)
Summary: bronchitis   Visit Type:  Acute visit Primary Provider/Referring Provider:  Corwin Levins MD  CC:  Acute visit...[name]...pt c/o incr congestion x2-3 days...prod cough w/ green sputum...she denies any sore throat and wheezing or incr SOB.  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 Pre-op FEV1 was 1.53 (80%) & DLCO 48% CT 03/24/09 no recurrence Off Spiriva since 2/11 since very expensive.  Jun 30, 2009 1:43 PM  only needed rescue inhaler once. Rpt PFTs >> FEv1 66%, FVC 63%, DLCO 44%    January 12, 2010 --Presents for 6 month COPD follow up - sinus pressure/congestion with light yellow mucus, PND, prod cough with light yellow mucus x2days - denies SOB, wheezing. Denies chest pain, dyspnea, orthopnea, hemoptysis, fever, n/v/d, edema, headache,recent travel or antibiotics.  She had  CT in 8/11 showed no reoccurrence of lung cancer. Has follow up CT scheduled 2/12 w/ follow up w/ Dr. Gwenyth Bouillon. She uses proventil rarely.   ROV 02/23/10 -- hx COPD and NSCLCA s/p L ULobectomy. Was feeling well after rx for bronchitis beginning 12/12. Then developed URI symptoms about a weeks ago (exposed to family member w URI). Clear to green mucous from nose and throat. Lots of cough, No wheezing or SOB. taking delsym and mucinex.   Preventive Screening-Counseling & Management  Alcohol-Tobacco     Alcohol drinks/day: 0     Smoking Status: quit > 6 months     Packs/Day: 1.0     Year Quit: 2008     Pack years: 40  Current Medications (verified): 1)  Pravachol 40 Mg Tabs (Pravastatin Sodium) .... Take 1 Tablet By Mouth Once A Day 2)  Omeprazole 20 Mg  Cpdr (Omeprazole) .... 2 By Mouth Once Daily 3)  Fosamax 70 Mg  Tabs (Alendronate Sodium) .... Take 1 Tablet By Mouth Once A Week 4)  Vitamin D3 1000 Unit  Tabs (Cholecalciferol) .Marland Kitchen.. 1 By Mouth Daily 5)  Zolpidem Tartrate 5 Mg Tabs (Zolpidem Tartrate)  .Marland Kitchen.. 1 By Mouth At Bedtime As Needed Sleep 6)  Ferrex 150 150 Mg Caps (Polysaccharide Iron Complex) .... 2 By Mouth Once Daily 7)  Furosemide 40 Mg Tabs (Furosemide) .... 1/2 - 1 By Mouth Once Daily 8)  Proventil Hfa 108 (90 Base) Mcg/act Aers (Albuterol Sulfate) .... 2 Puffs Four Times Per Day As Needed For Shortness of Breath 9)  Oxycodone Hcl 5 Mg Tabs (Oxycodone Hcl) .Marland Kitchen.. 1 By Mouth Q 6 Hrs As Needed Pain - To Fill Fill Dec 22, 2009 10)  Alprazolam 0.5 Mg Tabs (Alprazolam) .Marland Kitchen.. 1 Tab Two Times A Day As Needed  Allergies (verified): 1)  ! Pcn 2)  ! Amoxicillin 3)  ! Codeine 4)  ! Hydrocodone 5)  * Fentanyl  Social History: Packs/Day:  1.0  Vital Signs:  Patient profile:   70 year old female Height:      62 inches (157.48 cm) Weight:      176 pounds (80 kg) BMI:     32.31 O2 Sat:      93 % on Room air Temp:     98.2 degrees F (36.78 degrees C) oral Pulse rate:   75 / minute BP sitting:   120 / 74  (left arm) Cuff size:   large  Vitals Entered By: Michel Bickers CMA (February 23, 2010 2:54 PM)  O2 Sat at Rest %:  93 O2  Flow:  Room air CC: Acute visit...[name]...pt c/o incr congestion x2-3 days...prod cough w/ green sputum...she denies any sore throat, wheezing or incr SOB Comments Medications reviewed with patient Michel Bickers CMA  February 23, 2010 2:55 PM   Physical Exam  Additional Exam:  Gen. Pleasant, well-nourished, in no distress ENT - no lesions, no post nasal drip Neck: No JVD, no thyromegaly, no carotid bruits Lungs: no use of accessory muscles, no dullness to percussion, clear without rales or rhonchi  Cardiovascular: Rhythm regular, heart sounds  normal, no murmurs or gallops, no peripheral edema Musculoskeletal: No deformities, no cyanosis or clubbing      Impression & Recommendations:  Problem # 1:  ACUTE BRONCHITIS (ICD-466.0)  The following medications were removed from the medication list:    Zithromax Z-pak 250 Mg Tabs (Azithromycin) .Marland Kitchen... Take as  directed Her updated medication list for this problem includes:    Proventil Hfa 108 (90 Base) Mcg/act Aers (Albuterol sulfate) .Marland Kitchen... 2 puffs four times per day as needed for shortness of breath    Azithromycin 250 Mg Tabs (Azithromycin) .Marland Kitchen... Take 2 on the first day, then 1 by mouth once daily until gone.  Orders: Est. Patient Level III (16109)  Problem # 2:  C O P D (ICD-496)  Medications Added to Medication List This Visit: 1)  Azithromycin 250 Mg Tabs (Azithromycin) .... Take 2 on the first day, then 1 by mouth once daily until gone.  Patient Instructions: 1)  Continue your mucinex and delsym 2)  Use decongestants that contain either bromphenerimine or chlorphenerimine as directed 3)  Take azithromycin x 5 days  4)  Keep your ProAir available to use as needed  5)  Follow up with Dr Vassie Loll in 1 -2 months  Prescriptions: AZITHROMYCIN 250 MG TABS (AZITHROMYCIN) take 2 on the first day, then 1 by mouth once daily until gone.  #6 x 0   Entered and Authorized by:   Leslye Peer MD   Signed by:   Leslye Peer MD on 02/23/2010   Method used:   Electronically to        [facility] [phone]* (retail)       [address]       [place], [place]  [id number]       Ph: [phone] or [phone]       Fax: [phone]   RxID:   [id number]

## 2010-03-19 NOTE — Progress Notes (Signed)
  Phone Note Refill Request Message from:  Fax from Pharmacy on February 11, 2010 2:57 PM  Refills Requested: Medication #1:  FOSAMAX 70 MG  TABS Take 1 tablet by mouth once a week   Dosage confirmed as above?Dosage Confirmed   Last Refilled: 03/24/2009   [facility]: [facility]  Medication #2:  OMEPRAZOLE 20 MG  CPDR 2 by mouth once daily   Dosage confirmed as above?Dosage Confirmed   Last Refilled: 02/25/2009   Notes: [facility] Initial call taken by: Robin Ewing CMA Duncan Dull),  February 11, 2010 2:58 PM    Prescriptions: FOSAMAX 70 MG  TABS (ALENDRONATE SODIUM) Take 1 tablet by mouth once a week  #4 x 11   Entered by:   Zella Ball Ewing CMA (AAMA)   Authorized by:   Corwin Levins MD   Signed by:   Scharlene Gloss CMA (AAMA) on 02/11/2010   Method used:   Faxed to ...       [facility] (retail)       [address]       [place], [place]  [address]  [place]       Ph: [phone]       Fax: [phone]   RxID:   [id number] OMEPRAZOLE 20 MG  CPDR (OMEPRAZOLE) 2 by mouth once daily  #180 x 3   Entered by:   Scharlene Gloss CMA (AAMA)   Authorized by:   Corwin Levins MD   Signed by:   Scharlene Gloss CMA (AAMA) on 02/11/2010   Method used:   Faxed to ...       [facility] (retail)       [address]       [place], [place]  [address]  [place]       Ph: [phone]       Fax: [phone]   RxID:   [id number]

## 2010-03-20 NOTE — Letter (Signed)
[facility]   Imported By: Sherian Rein 10/08/2009 14:28:12  _____________________________________________________________________  External Attachment:    Type:   Image     Comment:   External Document

## 2010-03-20 NOTE — Letter (Signed)
Summary: [facility]  [facility]   Imported By: Lester [place] Isabella 02/27/2009 11:51:46  _____________________________________________________________________  External Attachment:    Type:   Image     Comment:   External Document

## 2010-03-20 NOTE — Consult Note (Signed)
Summary: [facility]  [facility]   Imported By: Sherian Rein 05/09/2009 09:04:15  _____________________________________________________________________  External Attachment:    Type:   Image     Comment:   External Document

## 2010-03-24 ENCOUNTER — Encounter (HOSPITAL_BASED_OUTPATIENT_CLINIC_OR_DEPARTMENT_OTHER): Payer: PRIVATE HEALTH INSURANCE | Admitting: Internal Medicine

## 2010-03-24 ENCOUNTER — Other Ambulatory Visit: Payer: Self-pay | Admitting: Internal Medicine

## 2010-03-24 ENCOUNTER — Encounter: Payer: Self-pay | Admitting: Internal Medicine

## 2010-03-24 DIAGNOSIS — C3490 Malignant neoplasm of unspecified part of unspecified bronchus or lung: Secondary | ICD-10-CM

## 2010-03-24 DIAGNOSIS — C3410 Malignant neoplasm of upper lobe, unspecified bronchus or lung: Secondary | ICD-10-CM

## 2010-03-24 DIAGNOSIS — N289 Disorder of kidney and ureter, unspecified: Secondary | ICD-10-CM

## 2010-04-01 ENCOUNTER — Other Ambulatory Visit: Payer: Self-pay | Admitting: Internal Medicine

## 2010-04-01 DIAGNOSIS — Z1231 Encounter for screening mammogram for malignant neoplasm of breast: Secondary | ICD-10-CM

## 2010-04-17 ENCOUNTER — Other Ambulatory Visit: Payer: Self-pay

## 2010-04-21 ENCOUNTER — Encounter (INDEPENDENT_AMBULATORY_CARE_PROVIDER_SITE_OTHER): Payer: Self-pay | Admitting: *Deleted

## 2010-04-21 ENCOUNTER — Other Ambulatory Visit: Payer: Self-pay | Admitting: Internal Medicine

## 2010-04-21 ENCOUNTER — Other Ambulatory Visit: Payer: PRIVATE HEALTH INSURANCE

## 2010-04-21 DIAGNOSIS — E785 Hyperlipidemia, unspecified: Secondary | ICD-10-CM

## 2010-04-21 DIAGNOSIS — N182 Chronic kidney disease, stage 2 (mild): Secondary | ICD-10-CM

## 2010-04-21 LAB — BASIC METABOLIC PANEL
Calcium: 9.1 mg/dL (ref 8.4–10.5)
GFR: 40.71 mL/min — ABNORMAL LOW (ref >60.00)
Sodium: 137 mEq/L (ref 135–145)

## 2010-04-21 LAB — LIPID PANEL
Cholesterol: 159 mg/dL (ref 0–200)
HDL: 37.7 mg/dL — ABNORMAL LOW (ref >39.00)
Triglycerides: 126 mg/dL (ref 0.0–149.0)

## 2010-04-23 NOTE — Letter (Signed)
Summary: [facility]  [facility]   Imported By: Sherian Rein 04/15/2010 07:31:38  _____________________________________________________________________  External Attachment:    Type:   Image     Comment:   External Document

## 2010-04-24 ENCOUNTER — Encounter: Payer: Self-pay | Admitting: Internal Medicine

## 2010-04-24 ENCOUNTER — Ambulatory Visit (INDEPENDENT_AMBULATORY_CARE_PROVIDER_SITE_OTHER): Payer: Medicare Other | Admitting: Internal Medicine

## 2010-04-24 DIAGNOSIS — Z23 Encounter for immunization: Secondary | ICD-10-CM

## 2010-04-24 DIAGNOSIS — K8020 Calculus of gallbladder without cholecystitis without obstruction: Secondary | ICD-10-CM | POA: Insufficient documentation

## 2010-04-24 DIAGNOSIS — N259 Disorder resulting from impaired renal tubular function, unspecified: Secondary | ICD-10-CM

## 2010-04-24 DIAGNOSIS — J019 Acute sinusitis, unspecified: Secondary | ICD-10-CM

## 2010-04-24 DIAGNOSIS — Z87442 Personal history of urinary calculi: Secondary | ICD-10-CM | POA: Insufficient documentation

## 2010-04-24 DIAGNOSIS — F329 Major depressive disorder, single episode, unspecified: Secondary | ICD-10-CM

## 2010-04-24 DIAGNOSIS — M5412 Radiculopathy, cervical region: Secondary | ICD-10-CM

## 2010-04-24 DIAGNOSIS — F3289 Other specified depressive episodes: Secondary | ICD-10-CM

## 2010-04-24 HISTORY — DX: Calculus of gallbladder without cholecystitis without obstruction: K80.20

## 2010-04-24 HISTORY — DX: Personal history of urinary calculi: Z87.442

## 2010-04-28 NOTE — Assessment & Plan Note (Signed)
Summary: 6 MO ROV/NWS #   Vital Signs:  Patient profile:   70 year old [name] Height:      62 inches Weight:      175 pounds BMI:     32.12 O2 Sat:      92 % on Room air Temp:     98.6 degrees F oral Pulse rate:   76 / minute BP sitting:   106 / 58  (left arm) Cuff size:   regular  Vitals Entered By: Zella Ball Ewing CMA Duncan Dull) (April 24, 2010 9:43 AM)  O2 Flow:  Room air CC: 6 month ROV/RE   Primary Care Provider:  Corwin Levins MD  CC:  6 month ROV/RE.  History of Present Illness: here to f/u; c/o 3 days acute onset midl to mod facial pain, pressure, fever , greenish d/c and midl ST but Pt denies CP, worsening sob, doe, wheezing, orthopnea, pnd, worsening LE edema, palps, dizziness or syncope  .neruo  Pt denies polydipsia, polyuria  Overall good compliance with meds, trying to follow low chol diet, wt stable, little excercise however  .  No recent wt loss, night sweats, loss of appetite or other constitutional symptom.  Denies worsening depressive symptoms, suicidal ideation, or panic, though has some ongoing anxiety, not worse recetnly.  Overall good compliance with meds, and good tolerability.  No longer sees renal on regular basis - for as needed f/u.   Ongoing pain overall well controlled on current meds.  No radicualr symptoms, or extremity weakness, numbenss, gait change, falls or bowel or bladder chagnes  Problems Prior to Update: 1)  Sinusitis- Acute-nos  (ICD-461.9) 2)  Cholelithiasis  (ICD-574.20) 3)  Nephrolithiasis, Hx of  (ICD-V13.01) 4)  Acute Bronchitis  (ICD-466.0) 5)  Preventive Health Care  (ICD-V70.0) 6)  Foot Pain, Left  (ICD-729.5) 7)  Leg Pain, Left  (ICD-729.5) 8)  Epistaxis, Recurrent  (ICD-784.7) 9)  Depression  (ICD-311) 10)  Cervicalgia  (ICD-723.1) 11)  Fatigue  (ICD-780.79) 12)  Leg Pain, Bilateral  (ICD-729.5) 13)  Plantar Fasciitis, Left  (ICD-728.71) 14)  Bursitis, Left Hip  (ICD-726.5) 15)  Back Pain  (ICD-724.5) 16)  Peripheral Edema   (ICD-782.3) 17)  Cellulitis, Leg, Left  (ICD-682.6) 18)  Back Pain  (ICD-724.5) 19)  Spondylosis, Cervical, With Radiculopathy  (ICD-723.4) 20)  Neck Pain, Acute  (ICD-723.1) 21)  Aortic Stenosis/ Insufficiency, Non-rheumatic  (ICD-424.1) 22)  Stricture and Stenosis of Esophagus  (ICD-530.3) 23)  Barretts Esophagus  (ICD-530.85) 24)  Pruritus  (ICD-698.9) 25)  Uti  (ICD-599.0) 26)  Anemia-iron Deficiency  (ICD-280.9) 27)  Pre-operative Cardiac Exam  (ICD-V72.81) 28)  Pre-operative Cardiovascular Examination  (ICD-V72.81) 29)  C O P D  (ICD-496) 30)  Carcinoma, Lung, Squamous Cell  (ICD-162.9) 31)  Anxiety  (ICD-300.00) 32)  Insomnia-sleep Disorder-unspec  (ICD-780.Jody) 33)  Abnormal Chest Xray  (ICD-793.1) 34)  Pneumonia  (ICD-486) 35)  Sinusitis- Acute-nos  (ICD-461.9) 36)  Preventive Health Care  (ICD-V70.0) 37)  Anemia of Other Chronic Disease  (ICD-285.29) 38)  Hyperkalemia  (ICD-276.7) 39)  Renal Insufficiency  (ICD-588.9) 40)  Sinusitis, Chronic  (ICD-473.9) 41)  Frequency, Urinary  (ICD-788.41) 42)  Sinusitis- Acute-nos  (ICD-461.9) 43)  Pneumonia  (ICD-486) 44)  Foot Pain, Right  (ICD-729.5) 45)  Preventive Health Care  (ICD-V70.0) 46)  Dyspepsia  (ICD-536.8) 47)  Dysphagia Unspecified  (ICD-787.20) 48)  Abdominal Pain, Generalized  (ICD-789.07) 49)  Uti  (ICD-599.0) 50)  Osteoarthritis, Knee, Left  (ICD-715.96) 51)  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 Take 1 Tablet By Mouth Once A Day 2)  Omeprazole 20 Mg  Cpdr (Omeprazole) .... 2 By Mouth Once Daily 3)  Fosamax 70 Mg  Tabs (Alendronate  Sodium) .... Take 1 Tablet By Mouth Once A Week 4)  Vitamin D3 1000 Unit  Tabs (Cholecalciferol) .Marland Kitchen.. 1 By Mouth Daily 5)  Zolpidem Tartrate 5 Mg Tabs (Zolpidem Tartrate) .Marland Kitchen.. 1 By Mouth At Bedtime As Needed Sleep 6)  Ferrex 150 150 Mg Caps (Polysaccharide Iron Complex) .... 2 By Mouth Once Daily 7)  Furosemide 40 Mg Tabs (Furosemide) .... 1/2 - 1 By Mouth Once Daily 8)  Proventil Hfa 108 (90 Base) Mcg/act Aers (Albuterol Sulfate) .... 2 Puffs Four Times Per Day As Needed For Shortness of Breath 9)  Oxycodone Hcl 5 Mg Tabs (Oxycodone Hcl) .Marland Kitchen.. 1 By Mouth Q 6 Hrs As Needed Pain - To Fill  Apr 22, 2010 10)  Alprazolam 0.5 Mg Tabs (Alprazolam) .Marland Kitchen.. 1 Tab Two Times A Day As Needed 11)  Azithromycin 250 Mg Tabs (Azithromycin) .... Take 2 On The First Day, Then 1 By Mouth Once Daily Until Gone.  Current Medications (verified): 1)  Pravachol 40 Mg Tabs (Pravastatin Sodium) .... Take 1 Tablet By Mouth Once A Day 2)  Omeprazole 20 Mg  Cpdr (Omeprazole) .... 2 By Mouth Once Daily 3)  Fosamax 70 Mg  Tabs (Alendronate Sodium) .... Take 1 Tablet By Mouth Once A Week 4)  Vitamin D3 1000 Unit  Tabs (Cholecalciferol) .Marland Kitchen.. 1 By Mouth Daily 5)  Zolpidem Tartrate 5 Mg Tabs (Zolpidem Tartrate) .Marland Kitchen.. 1 By Mouth At Bedtime As Needed Sleep 6)  Ferrex 150 150 Mg Caps (Polysaccharide Iron Complex) .... 2 By Mouth Once Daily 7)  Furosemide 40 Mg Tabs (Furosemide) .... 1/2 - 1 By Mouth Once Daily 8)  Proventil Hfa 108 (90 Base) Mcg/act Aers (Albuterol Sulfate) .... 2 Puffs Four Times Per Day As Needed For Shortness of Breath 9)  Oxycodone Hcl 5 Mg Tabs (Oxycodone Hcl) .Marland Kitchen.. 1 By Mouth Q 6 Hrs As Needed Pain - To Fill  Jun 23, 2010 10)  Alprazolam 0.5 Mg Tabs (Alprazolam) .Marland Kitchen.. 1 Tab Two Times A Day As Needed 11)  Azithromycin 250 Mg Tabs (Azithromycin) .... 2po Qd For 1 Day, Then 1po Qd For 4days, Then Stop  Allergies (verified): 1)  ! Pcn 2)  ! Amoxicillin 3)  ! Codeine 4)  ! Hydrocodone 5)  * Fentanyl  Past  History:  Past Surgical History: Last updated: 11/14/2008 Hysterectomy Tubal ligation Pentax video bronchoscope...06/20/2008... Left upper lobe lesion obstructing the left upper lobe   orifice, probable malignancy until proven otherwise.   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  , mild ill  Head:  normocephalic and atraumatic.   Eyes:  vision grossly intact, pupils equal, and pupils round.   Ears:  bilat tm's red, sinus tender bilat Nose:  nasal dischargemucosal pallor and mucosal edema.   Mouth:  pharyngeal erythema and fair dentition.   Neck:  supple and cervical lymphadenopathy.   Lungs:  normal respiratory effort and normal breath sounds.   Heart:  normal rate and regular rhythm.   Extremities:  no edema, no erythema  Skin:  color normal and no rashes.   Psych:  not depressed appearing and slightly anxious.     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 2po qd for 1 day, then 1po qd for 4days, then stop treat as above, f/u any worsening signs or symptoms   Instructed on treatment. Call if symptoms persist or worsen.   Problem # 3:  RENAL INSUFFICIENCY (ICD-588.9) stable overall by hx and exam, ok to continue meds/tx as is, labs faxed to renal as well  Problem # 4:  DEPRESSION (ICD-311)  Her updated medication list for this problem includes:    Alprazolam 0.5 Mg Tabs (Alprazolam) .Marland Kitchen... 1 tab two times a day as needed  Discussed treatment options, including trial of antidpressant medication. Patient agrees to call if any worsening of symptoms or thoughts of doing harm arise. Verified that the patient has no suicidal ideation at this time.   Complete Medication List: 1)  Pravachol 40 Mg Tabs (Pravastatin sodium) .... Take 1 tablet by mouth once a day 2)  Omeprazole 20 Mg Cpdr (Omeprazole) .... 2 by mouth once daily 3)  Fosamax 70 Mg Tabs (Alendronate sodium) .... Take 1 tablet by mouth once a week 4)  Vitamin D3 1000 Unit Tabs (Cholecalciferol) .Marland Kitchen.. 1 by mouth daily 5)  Zolpidem Tartrate 5 Mg Tabs (Zolpidem tartrate) .Marland Kitchen.. 1 by mouth at bedtime as needed sleep 6)  Ferrex 150 150 Mg Caps (Polysaccharide iron complex) .... 2 by mouth once daily 7)  Furosemide 40 Mg Tabs (Furosemide) .... 1/2 - 1 by mouth once daily 8)  Proventil Hfa 108 (90 Base) Mcg/act Aers (Albuterol sulfate) .... 2 puffs four times per day as needed for shortness of breath 9)  Oxycodone Hcl 5 Mg Tabs (Oxycodone hcl) .Marland Kitchen.. 1 by mouth q 6 hrs as needed pain - to fill  Jun 23, 2010 10)  Alprazolam 0.5 Mg Tabs (Alprazolam) .Marland Kitchen.. 1 tab two times a day as needed 11)  Azithromycin 250 Mg Tabs (Azithromycin) .... 2po qd for 1 day, then 1po qd for 4days, then stop  Other Orders: Admin 1st Vaccine (74259) DT  Vaccine (56387)  Patient Instructions: 1)  Please take all new medications as prescribed 2)  Continue all previous medications as before this visit  3)  Please schedule a follow-up appointment in 6 months for CPX with labs Prescriptions: OXYCODONE HCL  5 MG TABS (OXYCODONE HCL) 1 by mouth q 6 hrs as needed pain - to fill  Jun 23, 2010  #120 x 0   Entered and Authorized by:   Corwin Levins MD   Signed by:   Corwin Levins MD on 04/24/2010   Method used:   Print then Give to Patient   RxID:   [id number] OXYCODONE HCL 5 MG TABS (OXYCODONE HCL) 1 by mouth q 6 hrs as needed pain - to fill  May 24, 2010  #120 x 0   Entered and Authorized by:   Corwin Levins MD   Signed by:   Corwin Levins MD on 04/24/2010   Method used:   Print then Give to Patient   RxID:   [id number] OXYCODONE HCL 5 MG TABS (OXYCODONE HCL) 1 by mouth q 6 hrs as needed pain - to fill  Apr 24, 2010  #120 x 0   Entered and Authorized by:   Corwin Levins MD   Signed by:   Corwin Levins MD on 04/24/2010   Method used:   Print then Give to Patient   RxID:   [id number] AZITHROMYCIN 250 MG TABS (AZITHROMYCIN) 2po qd for 1 day, then 1po qd for 4days, then stop  #6 x 1   Entered and Authorized by:   Corwin Levins MD   Signed by:   Corwin Levins MD on 04/24/2010   Method used:   Print then Give to Patient   RxID:   [id number]    Orders Added: 1)  Admin 1st Vaccine [90471] 2)  DT Vaccine [42595] 3)  Est. Patient Level IV [63875]

## 2010-05-05 ENCOUNTER — Ambulatory Visit
Admission: RE | Admit: 2010-05-05 | Discharge: 2010-05-05 | Disposition: A | Discharge status: Home / Self Care | Payer: Medicare Other | Source: Ambulatory Visit | Attending: Internal Medicine | Admitting: Internal Medicine

## 2010-05-05 DIAGNOSIS — Z1231 Encounter for screening mammogram for malignant neoplasm of breast: Secondary | ICD-10-CM

## 2010-05-08 ENCOUNTER — Other Ambulatory Visit: Payer: Self-pay | Admitting: Internal Medicine

## 2010-05-08 DIAGNOSIS — R928 Other abnormal and inconclusive findings on diagnostic imaging of breast: Secondary | ICD-10-CM

## 2010-05-18 LAB — BASIC METABOLIC PANEL
BUN: 36 mg/dL — ABNORMAL HIGH (ref 6–23)
Chloride: 99 mEq/L (ref 96–112)
Creatinine, Ser: 1.52 mg/dL — ABNORMAL HIGH (ref 0.4–1.2)

## 2010-05-18 LAB — BRAIN NATRIURETIC PEPTIDE: Pro B Natriuretic peptide (BNP): 43.1 pg/mL (ref 0.0–100.0)

## 2010-05-18 LAB — DIFFERENTIAL
Basophils Absolute: 0.3 10*3/uL — ABNORMAL HIGH (ref 0.0–0.1)
Basophils Relative: 5 % — ABNORMAL HIGH (ref 0–1)
Eosinophils Absolute: 0.1 10*3/uL (ref 0.0–0.7)
Lymphs Abs: 1.1 10*3/uL (ref 0.7–4.0)
Neutrophils Relative %: 65 % (ref 43–77)

## 2010-05-18 LAB — CBC
MCV: 95.4 fL (ref 78.0–100.0)
Platelets: 330 10*3/uL (ref 150–400)
RDW: 12.3 % (ref 11.5–15.5)
WBC: 6.4 10*3/uL (ref 4.0–10.5)

## 2010-05-24 LAB — BASIC METABOLIC PANEL
BUN: 13 mg/dL (ref 6–23)
Calcium: 8.8 mg/dL (ref 8.4–10.5)
Chloride: 90 mEq/L — ABNORMAL LOW (ref 96–112)
Creatinine, Ser: 0.68 mg/dL (ref 0.4–1.2)

## 2010-05-25 LAB — BASIC METABOLIC PANEL
BUN: 15 mg/dL (ref 6–23)
BUN: 18 mg/dL (ref 6–23)
CO2: 23 mEq/L (ref 19–32)
CO2: 26 mEq/L (ref 19–32)
CO2: 28 mEq/L (ref 19–32)
CO2: 29 mEq/L (ref 19–32)
Calcium: 7.7 mg/dL — ABNORMAL LOW (ref 8.4–10.5)
Calcium: 7.9 mg/dL — ABNORMAL LOW (ref 8.4–10.5)
Calcium: 8.2 mg/dL — ABNORMAL LOW (ref 8.4–10.5)
Calcium: 8.4 mg/dL (ref 8.4–10.5)
Calcium: 8.7 mg/dL (ref 8.4–10.5)
Calcium: 8.9 mg/dL (ref 8.4–10.5)
Chloride: 105 mEq/L (ref 96–112)
Chloride: 85 mEq/L — ABNORMAL LOW (ref 96–112)
Creatinine, Ser: 0.83 mg/dL (ref 0.4–1.2)
Creatinine, Ser: 0.88 mg/dL (ref 0.4–1.2)
Creatinine, Ser: 0.92 mg/dL (ref 0.4–1.2)
GFR calc Af Amer: 50 mL/min — ABNORMAL LOW (ref >60)
GFR calc Af Amer: 52 mL/min — ABNORMAL LOW (ref >60)
GFR calc Af Amer: >60 mL/min (ref >60)
GFR calc Af Amer: >60 mL/min (ref >60)
GFR calc Af Amer: >60 mL/min (ref >60)
GFR calc Af Amer: >60 mL/min (ref >60)
GFR calc Af Amer: >60 mL/min (ref >60)
GFR calc non Af Amer: 43 mL/min — ABNORMAL LOW (ref >60)
GFR calc non Af Amer: >60 mL/min (ref >60)
GFR calc non Af Amer: >60 mL/min (ref >60)
GFR calc non Af Amer: >60 mL/min (ref >60)
Glucose, Bld: 152 mg/dL — ABNORMAL HIGH (ref 70–99)
Glucose, Bld: 92 mg/dL (ref 70–99)
Glucose, Bld: 94 mg/dL (ref 70–99)
Glucose, Bld: 96 mg/dL (ref 70–99)
Potassium: 4.1 mEq/L (ref 3.5–5.1)
Potassium: 4.2 mEq/L (ref 3.5–5.1)
Potassium: 4.3 mEq/L (ref 3.5–5.1)
Potassium: 4.7 mEq/L (ref 3.5–5.1)
Potassium: 5.5 mEq/L — ABNORMAL HIGH (ref 3.5–5.1)
Sodium: 121 mEq/L — ABNORMAL LOW (ref 135–145)
Sodium: 121 mEq/L — ABNORMAL LOW (ref 135–145)
Sodium: 122 mEq/L — ABNORMAL LOW (ref 135–145)
Sodium: 125 mEq/L — ABNORMAL LOW (ref 135–145)
Sodium: 126 mEq/L — ABNORMAL LOW (ref 135–145)
Sodium: 132 mEq/L — ABNORMAL LOW (ref 135–145)
Sodium: 132 mEq/L — ABNORMAL LOW (ref 135–145)

## 2010-05-25 LAB — POCT I-STAT 3, ART BLOOD GAS (G3+)
Bicarbonate: 22.1 mEq/L (ref 20.0–24.0)
O2 Saturation: 95 %
Patient temperature: 99.5
TCO2: 23 mmol/L (ref 0–100)

## 2010-05-25 LAB — COMPREHENSIVE METABOLIC PANEL
ALT: 18 U/L (ref 0–35)
Alkaline Phosphatase: 42 U/L (ref 39–117)
BUN: 15 mg/dL (ref 6–23)
BUN: 24 mg/dL — ABNORMAL HIGH (ref 6–23)
CO2: 20 mEq/L (ref 19–32)
CO2: 24 mEq/L (ref 19–32)
Chloride: 100 mEq/L (ref 96–112)
Chloride: 106 mEq/L (ref 96–112)
Creatinine, Ser: 1.33 mg/dL — ABNORMAL HIGH (ref 0.4–1.2)
GFR calc non Af Amer: 40 mL/min — ABNORMAL LOW (ref >60)
GFR calc non Af Amer: 46 mL/min — ABNORMAL LOW (ref >60)
Glucose, Bld: 128 mg/dL — ABNORMAL HIGH (ref 70–99)
Potassium: 4.7 mEq/L (ref 3.5–5.1)
Sodium: 128 mEq/L — ABNORMAL LOW (ref 135–145)
Total Bilirubin: 0.3 mg/dL (ref 0.3–1.2)
Total Bilirubin: 0.4 mg/dL (ref 0.3–1.2)
Total Protein: 5.8 g/dL — ABNORMAL LOW (ref 6.0–8.3)

## 2010-05-25 LAB — TYPE AND SCREEN: Antibody Screen: NEGATIVE

## 2010-05-25 LAB — CBC
HCT: 24.4 % — ABNORMAL LOW (ref 36.0–46.0)
HCT: 30.2 % — ABNORMAL LOW (ref 36.0–46.0)
HCT: 31 % — ABNORMAL LOW (ref 36.0–46.0)
HCT: 31.1 % — ABNORMAL LOW (ref 36.0–46.0)
Hemoglobin: 10.3 g/dL — ABNORMAL LOW (ref 12.0–15.0)
Hemoglobin: 10.6 g/dL — ABNORMAL LOW (ref 12.0–15.0)
Hemoglobin: 10.6 g/dL — ABNORMAL LOW (ref 12.0–15.0)
Hemoglobin: 7.8 g/dL — CL (ref 12.0–15.0)
Hemoglobin: 8.3 g/dL — ABNORMAL LOW (ref 12.0–15.0)
Hemoglobin: 8.6 g/dL — ABNORMAL LOW (ref 12.0–15.0)
MCHC: 33.2 g/dL (ref 30.0–36.0)
MCHC: 33.5 g/dL (ref 30.0–36.0)
MCHC: 33.9 g/dL (ref 30.0–36.0)
MCHC: 34.2 g/dL (ref 30.0–36.0)
MCV: 100.3 fL — ABNORMAL HIGH (ref 78.0–100.0)
MCV: 99.6 fL (ref 78.0–100.0)
Platelets: 184 10*3/uL (ref 150–400)
RBC: 2.33 MIL/uL — ABNORMAL LOW (ref 3.87–5.11)
RBC: 2.43 MIL/uL — ABNORMAL LOW (ref 3.87–5.11)
RBC: 2.58 MIL/uL — ABNORMAL LOW (ref 3.87–5.11)
RBC: 3.12 MIL/uL — ABNORMAL LOW (ref 3.87–5.11)
RBC: 3.14 MIL/uL — ABNORMAL LOW (ref 3.87–5.11)
RBC: 3.23 MIL/uL — ABNORMAL LOW (ref 3.87–5.11)
RDW: 14.8 % (ref 11.5–15.5)
RDW: 15.4 % (ref 11.5–15.5)
RDW: 15.7 % — ABNORMAL HIGH (ref 11.5–15.5)
WBC: 5.5 10*3/uL (ref 4.0–10.5)
WBC: 9.7 10*3/uL (ref 4.0–10.5)
WBC: 9.9 10*3/uL (ref 4.0–10.5)

## 2010-05-25 LAB — POCT I-STAT 7, (LYTES, BLD GAS, ICA,H+H)
Calcium, Ion: 1.25 mmol/L (ref 1.12–1.32)
HCT: 27 % — ABNORMAL LOW (ref 36.0–46.0)
Hemoglobin: 9.2 g/dL — ABNORMAL LOW (ref 12.0–15.0)
Patient temperature: 35
Potassium: 4.8 mEq/L (ref 3.5–5.1)
TCO2: 26 mmol/L (ref 0–100)
pCO2 arterial: 43.4 mmHg (ref 35.0–45.0)
pH, Arterial: 7.344 — ABNORMAL LOW (ref 7.350–7.400)

## 2010-05-25 LAB — BLOOD GAS, ARTERIAL
Acid-base deficit: 3.8 mmol/L — ABNORMAL HIGH (ref 0.0–2.0)
FIO2: 0.21 %
O2 Saturation: 98.7 %
TCO2: 20.7 mmol/L (ref 0–100)
pCO2 arterial: 30.2 mmHg — ABNORMAL LOW (ref 35.0–45.0)
pO2, Arterial: 110 mmHg — ABNORMAL HIGH (ref 80.0–100.0)

## 2010-05-25 LAB — URINALYSIS, ROUTINE W REFLEX MICROSCOPIC
Bilirubin Urine: NEGATIVE
Ketones, ur: NEGATIVE mg/dL
Nitrite: NEGATIVE
Urobilinogen, UA: 0.2 mg/dL (ref 0.0–1.0)
pH: 5 (ref 5.0–8.0)

## 2010-05-25 LAB — PROTIME-INR
INR: 1.1 (ref 0.00–1.49)
Prothrombin Time: 14.2 seconds (ref 11.6–15.2)

## 2010-05-25 LAB — APTT: aPTT: 56 seconds — ABNORMAL HIGH (ref 24–37)

## 2010-05-25 LAB — URINE MICROSCOPIC-ADD ON

## 2010-05-25 LAB — GLUCOSE, CAPILLARY

## 2010-05-26 ENCOUNTER — Ambulatory Visit
Admission: RE | Admit: 2010-05-26 | Discharge: 2010-05-26 | Disposition: A | Discharge status: Home / Self Care | Payer: Medicare Other | Source: Ambulatory Visit | Attending: Internal Medicine | Admitting: Internal Medicine

## 2010-05-26 DIAGNOSIS — R928 Other abnormal and inconclusive findings on diagnostic imaging of breast: Secondary | ICD-10-CM

## 2010-05-26 LAB — CBC
HCT: 31.1 % — ABNORMAL LOW (ref 36.0–46.0)
Hemoglobin: 10.3 g/dL — ABNORMAL LOW (ref 12.0–15.0)
MCHC: 33.3 g/dL (ref 30.0–36.0)
RBC: 3.19 MIL/uL — ABNORMAL LOW (ref 3.87–5.11)
RDW: 16.2 % — ABNORMAL HIGH (ref 11.5–15.5)

## 2010-05-26 LAB — PROTIME-INR
INR: 1.1 (ref 0.00–1.49)
Prothrombin Time: 14.2 seconds (ref 11.6–15.2)

## 2010-05-26 LAB — GLUCOSE, CAPILLARY: Glucose-Capillary: 72 mg/dL (ref 70–99)

## 2010-05-26 LAB — COMPREHENSIVE METABOLIC PANEL
ALT: 17 U/L (ref 0–35)
Alkaline Phosphatase: 63 U/L (ref 39–117)
BUN: 29 mg/dL — ABNORMAL HIGH (ref 6–23)
CO2: 22 mEq/L (ref 19–32)
GFR calc non Af Amer: 29 mL/min — ABNORMAL LOW (ref >60)
Glucose, Bld: 69 mg/dL — ABNORMAL LOW (ref 70–99)
Potassium: 5.2 mEq/L — ABNORMAL HIGH (ref 3.5–5.1)
Sodium: 137 mEq/L (ref 135–145)
Total Protein: 7.1 g/dL (ref 6.0–8.3)

## 2010-05-26 LAB — CULTURE, RESPIRATORY W GRAM STAIN

## 2010-05-26 LAB — AFB CULTURE WITH SMEAR (NOT AT ARMC)

## 2010-05-26 LAB — FUNGUS CULTURE W SMEAR
Fungal Smear: NONE SEEN
Special Requests: ABNORMAL

## 2010-05-26 LAB — TYPE AND SCREEN: Antibody Screen: NEGATIVE

## 2010-06-04 ENCOUNTER — Other Ambulatory Visit: Payer: Self-pay

## 2010-06-04 MED ORDER — ZOLPIDEM TARTRATE 5 MG PO TABS: 5.0000 mg | ORAL_TABLET | Freq: Every evening | ORAL | Status: DC | PRN

## 2010-06-04 NOTE — Telephone Encounter (Signed)
Done hardcopy to dahlia/LIM B  

## 2010-06-04 NOTE — Telephone Encounter (Signed)
Rx faxed to pharmacy  

## 2010-06-04 NOTE — Telephone Encounter (Signed)
[facility] requesting refill on Zolpidem 5 mg. Last refill 11/17/09 #30 with 5 refills and last OV 04/24/10.

## 2010-06-08 ENCOUNTER — Encounter: Payer: Self-pay | Admitting: Internal Medicine

## 2010-06-09 ENCOUNTER — Other Ambulatory Visit (INDEPENDENT_AMBULATORY_CARE_PROVIDER_SITE_OTHER): Payer: Medicare Other

## 2010-06-09 ENCOUNTER — Other Ambulatory Visit: Payer: Self-pay | Admitting: Internal Medicine

## 2010-06-09 ENCOUNTER — Ambulatory Visit (INDEPENDENT_AMBULATORY_CARE_PROVIDER_SITE_OTHER): Payer: Medicare Other | Admitting: Internal Medicine

## 2010-06-09 ENCOUNTER — Encounter: Payer: Self-pay | Admitting: Internal Medicine

## 2010-06-09 VITALS — BP 148/70 | HR 76 | Temp 98.9°F | Ht 62.0 in | Wt 174.0 lb

## 2010-06-09 DIAGNOSIS — M2550 Pain in unspecified joint: Secondary | ICD-10-CM

## 2010-06-09 DIAGNOSIS — Z0000 Encounter for general adult medical examination without abnormal findings: Secondary | ICD-10-CM

## 2010-06-09 DIAGNOSIS — D509 Iron deficiency anemia, unspecified: Secondary | ICD-10-CM

## 2010-06-09 DIAGNOSIS — M79605 Pain in left leg: Secondary | ICD-10-CM | POA: Insufficient documentation

## 2010-06-09 DIAGNOSIS — N259 Disorder resulting from impaired renal tubular function, unspecified: Secondary | ICD-10-CM

## 2010-06-09 DIAGNOSIS — M79609 Pain in unspecified limb: Secondary | ICD-10-CM

## 2010-06-09 DIAGNOSIS — C3490 Malignant neoplasm of unspecified part of unspecified bronchus or lung: Secondary | ICD-10-CM

## 2010-06-09 HISTORY — DX: Pain in unspecified joint: M25.50

## 2010-06-09 LAB — IBC PANEL
Iron: 52 ug/dL (ref 42–145)
Saturation Ratios: 15.7 % — ABNORMAL LOW (ref 20.0–50.0)
Transferrin: 236.8 mg/dL (ref 212.0–360.0)

## 2010-06-09 LAB — SEDIMENTATION RATE: Sed Rate: 73 mm/hr — ABNORMAL HIGH (ref 0–22)

## 2010-06-09 LAB — BASIC METABOLIC PANEL
BUN: 21 mg/dL (ref 6–23)
GFR: 41.59 mL/min — ABNORMAL LOW (ref >60.00)
Potassium: 4.6 mEq/L (ref 3.5–5.1)
Sodium: 142 mEq/L (ref 135–145)

## 2010-06-09 LAB — CBC WITH DIFFERENTIAL/PLATELET
Basophils Relative: 0.4 % (ref 0.0–3.0)
Eosinophils Relative: 2.3 % (ref 0.0–5.0)
Lymphocytes Relative: 23.9 % (ref 12.0–46.0)
Monocytes Absolute: 0.5 10*3/uL (ref 0.1–1.0)
Monocytes Relative: 8.8 % (ref 3.0–12.0)
Neutrophils Relative %: 64.6 % (ref 43.0–77.0)
Platelets: 237 10*3/uL (ref 150.0–400.0)
RBC: 3.38 Mil/uL — ABNORMAL LOW (ref 3.87–5.11)
WBC: 5.6 10*3/uL (ref 4.5–10.5)

## 2010-06-09 LAB — LIPID PANEL
HDL: 38.9 mg/dL — ABNORMAL LOW (ref >39.00)
Triglycerides: 116 mg/dL (ref 0.0–149.0)
VLDL: 23.2 mg/dL (ref 0.0–40.0)

## 2010-06-09 MED ORDER — PREDNISONE 10 MG PO TABS: 10.0000 mg | ORAL_TABLET | Freq: Every day | ORAL | Status: AC

## 2010-06-09 MED ORDER — METHYLPREDNISOLONE ACETATE 80 MG/ML IJ SUSP
120.0000 mg | Freq: Once | INTRAMUSCULAR | Status: AC
  Administered 2010-06-09: 120 mg via INTRAMUSCULAR

## 2010-06-09 NOTE — Assessment & Plan Note (Signed)
Mild to mod, hands and feet - ? Gout vs djd vs even Ra - for rheum labs and uric acid, for depomedrol IM, and predpack for home,  to f/u any worsening symptoms or concerns

## 2010-06-09 NOTE — Assessment & Plan Note (Signed)
Mild to mod, ? Related to gout vs djd vs even RA with pain to hands and feet - for rheum labs today, as well as LLE venous doppler - r/o DVT

## 2010-06-09 NOTE — Patient Instructions (Signed)
You had the steroid shot today Take all new medications as prescribed Continue all other medications as before You will be contacted regarding the referral for: Left leg ultrasound to make sure no blood clot Please go to LAB in the Basement for the blood and/or urine tests to be done today Please call the number on the [facility] (the [facility]) for results of testing in 2-3 days Please return in Sept 2012 with Lab testing done 3-5 days before

## 2010-06-09 NOTE — Progress Notes (Signed)
Subjective:    Patient ID: [name], female    DOB: 08-02-1940, 70 y.o.   MRN: [id number]  HPI  Here to f/u with 2 wks onset pain and swelling to the distal leg, ankle and foot - ? Gout;  Never had gout in the past;  Overall moderate, more pain since last visit;  Walking makes it severe, sitting makes better without the pressure;  No fever, trauma or hx of same in the past.  This is on top of new 1-2 mo increased mult joint pain including the hands and feet though without swelling, but has been stiff, not clear if worse in the am.  Pt denies chest pain, increased sob or doe, wheezing, orthopnea, PND, increased LE swelling, palpitations, dizziness or syncope.  Pt denies new neurological symptoms such as new headache, or facial or extremity weakness or numbness   Pt denies polydipsia, polyuria.  Pt states overall good compliance with meds, trying to follow lower cholesterol, diabetic diet, wt overall stable but little exercise however due to current complaints.   Pt denies fever, wt loss, night sweats, loss of appetite, or other constitutional symptoms  Overall good compliance with treatment, and good medicine tolerability.  Denies worsening depressive symptoms, suicidal ideation, or panic.   Past Medical History  Diagnosis Date  . Cancer     lung ca  . 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 HYPERLIPIDEMIA 11/18/2006  . HYPERKALEMIA 02/05/2008  . ANEMIA-IRON DEFICIENCY 09/22/2008  . Anemia of other chronic disease 02/05/2008  . ANXIETY 06/03/2008  . DEPRESSION 04/28/2009  . HYPERTENSION 11/18/2006  . PERICARDITIS 01/03/2007  . AORTIC STENOSIS/ INSUFFICIENCY, NON-RHEUMATIC 12/03/2008  . SINUSITIS- ACUTE-NOS 11/20/2007  . SINUSITIS, CHRONIC 02/05/2008  . ALLERGIC RHINITIS 11/18/2006  . PNEUMONIA 05/02/2007  . C O P D 06/27/2008  . Stricture and stenosis of esophagus 11/14/2008  . GERD 01/03/2007  . BARRETTS ESOPHAGUS 11/14/2008  . RENAL INSUFFICIENCY 02/05/2008  . DYSPEPSIA 03/23/2007  . PRURITUS  10/08/2008  . OSTEOARTHRITIS 11/18/2006  . OSTEOARTHRITIS, KNEE, LEFT 01/03/2007  . Cervicalgia 01/13/2009  . SPONDYLOSIS, CERVICAL, WITH RADICULOPATHY 01/13/2009  . BACK PAIN 02/04/2009  . BURSITIS, LEFT HIP 04/18/2009  . OSTEOPOROSIS 11/18/2006  . INSOMNIA-SLEEP DISORDER-UNSPEC 06/03/2008  . FATIGUE 04/18/2009  . PERIPHERAL EDEMA 02/13/2009  . MURMUR 11/18/2006  . DYSPHAGIA UNSPECIFIED 03/23/2007  . Abdominal pain, generalized 03/01/2007  . Nonspecific (abnormal) findings on radiological and other examination of body structure 06/03/2008  . Tuberculin Test Reaction 11/18/2006  . Acute bronchitis 02/23/2010  . CHOLELITHIASIS 04/24/2010  . NEPHROLITHIASIS, HX OF 04/24/2010  . Polyarthralgia 06/09/2010  . Elevated sed rate 06/11/2010  . Positive ANA (antinuclear antibody) 06/11/2010  . Rheumatoid factor positive 06/11/2010   Past Surgical History  Procedure Date  . Abdominal hysterectomy   . Tubal ligation   . Tumor removal 08/06/08    from lungs    reports that she quit smoking about 4 years ago. She does not have any smokeless tobacco history on file. She reports that she does not drink alcohol or use illicit drugs. family history includes Coronary artery disease in her other; Hyperlipidemia in her other; and Hypertension in her other. Allergies  Allergen Reactions  . Amoxicillin     REACTION: unknown reaction  . Codeine     REACTION: rash/hives  . Fentanyl     REACTION: hallucinations  . Hydrocodone     REACTION: ineffective  . Penicillins     REACTION: rash/hives   Current Outpatient Prescriptions on  File Prior to Visit  Medication Sig Dispense Refill  . albuterol (PROVENTIL HFA) 108 (90 BASE) MCG/ACT inhaler Inhale into the lungs 4 (four) times daily as needed.        Marland Kitchen alendronate (FOSAMAX) 70 MG tablet Take 70 mg by mouth every 7 (seven) days. Take with a full glass of water on an empty stomach.       . ALPRAZolam (XANAX) 0.5 MG tablet Take 0.5 mg by mouth 2 (two) times daily as  needed.        . Cholecalciferol (VITAMIN D3) 1000 UNITS CAPS Take by mouth daily.        . furosemide (LASIX) 40 MG tablet Take 40 mg by mouth daily.        . iron polysaccharides (NIFEREX) 150 MG capsule Take 150 mg by mouth 2 (two) times daily.        Marland Kitchen omeprazole (PRILOSEC) 20 MG capsule Take 20 mg by mouth daily.        Marland Kitchen oxyCODONE (OXY IR/ROXICODONE) 5 MG immediate release tablet Take 5 mg by mouth every 6 (six) hours as needed.        . zolpidem (AMBIEN) 5 MG tablet Take 1 tablet (5 mg total) by mouth at bedtime as needed for sleep.  30 tablet  5   Review of Systems Review of Systems  Constitutional: Negative for diaphoresis and unexpected weight change.  HENT: Negative for drooling and tinnitus.   Eyes: Negative for photophobia and visual disturbance.  Respiratory: Negative for choking and stridor.   Gastrointestinal: Negative for vomiting and blood in stool.  Genitourinary: Negative for hematuria and decreased urine volume.  Musculoskeletal: Negative for gait problem. except the LLE pain causes some limp Skin: Negative for color change and wound.  Neurological: Negative for tremors and numbness.  Psychiatric/Behavioral: Negative for decreased concentration. The patient is not hyperactive.       Objective:   Physical Exam BP 148/70  Pulse 76  Temp(Src) 98.9 F (37.2 C) (Oral)  Ht 5\' 2"  (1.575 m)  Wt 174 lb (78.926 kg)  BMI 31.83 kg/m2  SpO2 95% Physical Exam  VS noted Constitutional: Pt appears well-developed and well-nourished.  HENT: Head: Normocephalic.  Right Ear: External ear normal.  Left Ear: External ear normal.  Eyes: Conjunctivae and EOM are normal. Pupils are equal, round, and reactive to light.  Neck: Normal range of motion. Neck supple.  Cardiovascular: Normal rate and regular rhythm.   Pulmonary/Chest: Effort normal and breath sounds normal.  Abd:  Soft, NT, non-distended, + BS Neurological: Pt is alert. No cranial nerve deficit.  Skin: Skin is warm.  No erythema.  Psychiatric: Pt behavior is normal. Thought content normal. 1+ nervous Left ankle with 1-2 swelling, with tender, but no erythema, ulcer, drainage, fluctuance.       Assessment & Plan:

## 2010-06-10 ENCOUNTER — Encounter (INDEPENDENT_AMBULATORY_CARE_PROVIDER_SITE_OTHER): Payer: Medicare Other | Admitting: Cardiology

## 2010-06-10 DIAGNOSIS — M79609 Pain in unspecified limb: Secondary | ICD-10-CM

## 2010-06-10 DIAGNOSIS — M7989 Other specified soft tissue disorders: Secondary | ICD-10-CM

## 2010-06-10 LAB — ANTI-NUCLEAR AB-TITER (ANA TITER): ANA Titer 1: 1:320 {titer} — ABNORMAL HIGH

## 2010-06-10 LAB — ANTI-DNA ANTIBODY, DOUBLE-STRANDED: ds DNA Ab: 18 IU/mL (ref <30)

## 2010-06-11 ENCOUNTER — Encounter: Payer: Self-pay | Admitting: Internal Medicine

## 2010-06-11 ENCOUNTER — Other Ambulatory Visit: Payer: Self-pay | Admitting: Internal Medicine

## 2010-06-11 DIAGNOSIS — M2550 Pain in unspecified joint: Secondary | ICD-10-CM

## 2010-06-11 DIAGNOSIS — R700 Elevated erythrocyte sedimentation rate: Secondary | ICD-10-CM | POA: Insufficient documentation

## 2010-06-11 DIAGNOSIS — R768 Other specified abnormal immunological findings in serum: Secondary | ICD-10-CM

## 2010-06-11 HISTORY — DX: Other specified abnormal immunological findings in serum: R76.8

## 2010-06-11 HISTORY — DX: Elevated erythrocyte sedimentation rate: R70.0

## 2010-06-14 ENCOUNTER — Encounter: Payer: Self-pay | Admitting: Internal Medicine

## 2010-06-14 NOTE — Assessment & Plan Note (Signed)
No overt bleeding or bruising, has also CKD, to check studies,  to f/u any worsening symptoms or concerns, Continue all other medications as before

## 2010-06-14 NOTE — Assessment & Plan Note (Signed)
stable overall by hx and exam, most recent lab reviewed with pt, and pt to continue medical treatment as before, doubt swelling related, to check labs,  to f/u any worsening symptoms or concerns  Lab Results  Component Value Date   WBC 5.6 06/09/2010   HGB 11.1* 06/09/2010   HCT 32.7* 06/09/2010   PLT 237.0 06/09/2010   CHOL 146 06/09/2010   TRIG 116.0 06/09/2010   HDL 38.90* 06/09/2010   LDLDIRECT 141.9 05/02/2006   ALT 15 10/21/2009   AST 13 03/19/2010   NA 142 06/09/2010   K 4.6 06/09/2010   CL 104 06/09/2010   CREATININE 1.6* 06/09/2010   BUN 21 06/09/2010   CO2 28 06/09/2010   TSH 1.57 10/21/2009   INR 1.1 08/02/2008

## 2010-06-15 ENCOUNTER — Encounter: Payer: Self-pay | Admitting: Internal Medicine

## 2010-06-15 NOTE — Progress Notes (Signed)
Quick Note:  Voice message left on [employer] system - lab is negative, normal or otherwise stable, pt to continue same tx ______ 

## 2010-06-30 NOTE — Assessment & Plan Note (Signed)
[facility]                         GASTROENTEROLOGY OFFICE NOTE   NAME:[name], Jody PEDLEY                        MRN:          [id number]  DATE:05/10/2007                            DOB:          Jul 07, 1940    PROBLEM:  Dyspepsia.   Ms. Klinkner has returned for scheduled followup.  At upper endoscopy, a  distal esophageal stricture was dilated to 18 mm.  Abnormal mucosa  prompted biopsies, which demonstrated Barrett's esophagus.  No dysplasia  was seen.  Mrs. Beckom now feels well.  Specifically, she is without  dyspepsia or dysphagia.   EXAM:  Pulse 68, blood pressure 138/62, weight 176.   IMPRESSION:  1. Esophageal stricture - asymptomatic following dilatation therapy.  2. GERD.  3. Barrett's esophagus.   RECOMMENDATIONS:  1. Continue omeprazole.  2. Followup endoscopy for surveillance of Barrett's in approximately      one year.     Barbette Hair. Arlyce Dice, MD,FACG  Electronically Signed    RDK/MedQ  DD: 05/10/2007  DT: 05/10/2007  Job #: [id number]

## 2010-06-30 NOTE — Assessment & Plan Note (Signed)
OFFICE VISIT   [name], [name]  DOB:  Jul 20, 1940                                        October 09, 2008  CHART #:  [id number]   The patient came for followup.  Her chest x-ray shows further  improvement after her left upper lobe resection.  Her blood pressure was  135/71, pulse 75, respirations 18, sats were 98%.  I plan to see her  back again in 3 months with a chest x-ray, and she is seeing Dr. Arbutus Ped  and he will get a CT scan in 6 months.   [name], M.D.  Electronically Signed   DPB/MEDQ  D:  10/09/2008  T:  10/09/2008  Job:  [id number]

## 2010-06-30 NOTE — Letter (Signed)
August 21, 2008   Corwin Levins, MD  [address]  [place], [place] [address]   Re:  [name], [name]                 DOB:  1940-12-22   Dear Dr. Jonny Ruiz:   I saw the patient back today after we had done a left upper lobectomy  for a stage IA non-small cell lung cancer.  She is presently in a  skilled nursing facility obtaining rehab and she comes back today for  first postoperative visit.  Chest x-ray with normal postoperative  changes.  Her incisions are well healed.  We removed her chest tube  sutures.  Her lungs are clear to auscultation and percussion.  She is  having a moderate amount of pain medication.  I told her to try some  Tylenol between the pain medication and she is getting rehab to  gradually increase her activities.  Overall, she is making slow but  satisfactory progress.  I will see her back again in 3 weeks with  another chest x-ray.   [name], M.D.  Electronically Signed   DPB/MEDQ  D:  08/21/2008  T:  08/22/2008  Job:  [id number]   cc:   Bennett Scrape, MD

## 2010-06-30 NOTE — Op Note (Signed)
[name], [name]                 ACCOUNT NO.:  [id number]   MEDICAL RECORD NO.:  [id number]          PATIENT TYPE:  AMB   LOCATION:  CARD                         FACILITY:  [facility]   PHYSICIAN:  Charlcie Cradle. Delford Field, MD, FCCPDATE OF BIRTH:  08/27[birth year]40   DATE OF PROCEDURE:  06/20/2008  DATE OF DISCHARGE:                               OPERATIVE REPORT   INDICATIONS:  Left upper lobe collapse, evaluate for cause.   OPERATOR:  Charlcie Cradle. Delford Field, MD, FCCP   ANESTHESIA:  1% Xylocaine local.   PREOPERATIVE MEDICATION:  Fentanyl 30 mcg, Versed 3 mg IV push.   PROCEDURE:  The Pentax video bronchoscope was introduced through the  left nares.  The upper airways were visualized and were normal.  Then  the scope was introduced into the lower airway.  The trachea was normal.  The right mainstem airway and upper and lower lobe branches were normal.  However, upon inspection of the left upper lobe orifice, it was totally  occluded by an exophytic lesion.  The left lower lobe was patent and  unremarkable.  Attention was then paid to the left upper lobe.  Cytology  brushings, biopsies and washings were obtained.   COMPLICATIONS:  None.   IMPRESSION:  Left upper lobe lesion obstructing the left upper lobe  orifice, probable malignancy until proven otherwise.   RECOMMENDATIONS:  Follow up pathology and microbiology.      Charlcie Cradle Delford Field, MD, [facility]  Electronically Signed     PEW/MEDQ  D:  06/20/2008  T:  06/20/2008  Job:  [id number]   cc:   Corwin Levins, MD  [address]  [place]  [place] [address]

## 2010-06-30 NOTE — Assessment & Plan Note (Signed)
OFFICE VISIT   [name], [name]  DOB:  1940-11-03                                        July 16, 2008  CHART #:  [id number]   The patient returned today for followup, status post flexible fiberoptic  bronchoscopy and mediastinoscopy of left and biopsies on Jul 04, 2008.  Flexible fiberoptic bronchoscopy at that time confirmed that this cancer  was protruding from the orifice of the left upper lobe and was involving  the carina between the upper and lower lobe.  I do not feel this would  be resectable without pneumonectomy.  The mediastinal lymph node  biopsies were all negative for cancer including 4R, 4L, and subcarinal  regions.  The patient said she has minimal discomfort from the  mediastinoscopy.  She has had no change in her overall condition and  denies any shortness of breath or hemoptysis.   PHYSICAL EXAMINATION:  VITAL SIGNS:  Blood pressure was 150/70, pulse 88  and regular, respiratory rate 18 and unlabored.  Oxygen saturation 98%  on room air.  GENERAL:  She looks well.  CARDIAC:  Regular rate and rhythm with normal heart sounds.  LUNGS:  Slight expiratory wheeze.   She saw Dr. Verne Carrow on Jul 01, 2008, for preoperative  cardiac clearance.  He did not feel that any further invasive workup was  indicated since she had no symptoms.  She apparently did have an  echocardiogram done, but I do not have the results of that yet.  I will  follow up on that study.   IMPRESSION:  The patient has a left upper lobe squamous cell carcinoma  that will require a pneumonectomy for resection.  I do not think a  sleeve resection would be possible.  She has no evidence of metastatic  disease, although I suspect that she will probably a positive N1 lymph  nodes based on her PET scan.  She has borderline lung function to  tolerate a pneumonectomy.  I think pneumectomy is possible, although it  may leave her with significant pulmonary disability and I  discussed that  possibility with her and her son.  I also discussed the high-risk nature  of his surgery and told them that her risk of major morbidity or  mortality was probably as high as 25% with surgical treatment.  I also  discussed the alternative of definitive radiation and chemotherapy with  them.  She is not sure, but she would like to do yet, but would like to  have a second opinion with another surgeon in our practice and I have  scheduled her an appointment for her to see Dr. Karle Plumber for  second opinion.  After she sees Dr. [name], she will be in contact with  me about what she decides would be the best treatment option for her.  If she decides to go with chemotherapy and radiation, then we will refer  her to Medical Oncology and Radiation Oncology.   [name], M.D.  Electronically Signed   BB/MEDQ  D:  07/16/2008  T:  07/17/2008  Job:  [id number]

## 2010-06-30 NOTE — H&P (Signed)
NAMESOREN, [name]                 ACCOUNT NO.:  [id number]   MEDICAL RECORD NO.:  [id number]          PATIENT TYPE:  OUT   LOCATION:  XRAY                         FACILITY:  [facility]   PHYSICIAN:  [name], M.D. DATE OF BIRTH:  05/21/1940   DATE OF ADMISSION:  07/12/2008  DATE OF DISCHARGE:  07/12/2008                              HISTORY & PHYSICAL   CHIEF COMPLAINT:  Lung mass.   HISTORY OF PRESENT ILLNESS:  This is a 70 year old previous smoker was  treated with a virus infection in March 2010 and had a chest x-ray that  showed a left lung abnormality.  CT scan showed a left lingular  consolidation with central obstruction mass and some precarinal nodes.  She underwent bronchoscopy with Dr. Delford Field on May 10, she was found to  have a totally occluded left upper lobe orifice with an exophytic lesion  with squamous cell cancer.  PET scan showed hypermetabolic area in the  left upper lobe but no nodal uptake.  She has had no hemoptysis, fever,  chills, or excessive sputum.  Her pulmonary function test showed an FVC  of 2.14 with an FEV1 of 1.53 or 80% predicted and diffusion capacity of  50% with correction of 88%.   ALLERGIES:  She is allergic to AMOXICILLIN, CODEINE, and HYDROCODONE.   MEDICATIONS:  1. Pravachol 40 mg a day.  2. Omeprazole 40 mg daily.  3. Fosamax 70 mg daily.  4. Bentyl 20 mg twice a day.  5. Darvocet p.r.n.  6. Benazepril/hydrochlorothiazide 10/12.5 daily.  7. Vitamin D.  8. Zyrtec 10 mg p.r.n.  9. Zyloprim 500 mg p.r.n. at night.  10.Alprazolam 0.25 mg b.i.d.  11.Flonase 50 mcg twice a day.   SOCIAL HISTORY:  She is widowed.  She has 2 children.  Quit smoking 2  years ago, smoked half pack a day.   FAMILY HISTORY:  Negative for cancer.   PAST MEDICAL HISTORY:  Hyperlipidemia, hypertension, osteoarthritis,  osteoporosis, esophageal stricture with Barrett's esophagitis, history  of pericarditis, hysterectomy, and tubal ligation.   REVIEW OF  SYSTEMS:  CARDIAC:  No angina or atrial fibrillation.  PULMONARY:  See history of present illness.  No hemoptysis.  GI:  No  kidney disease, dysuria, or frequent urination.  VASCULAR:  No  claudication, DVT, or TIAs.  NEUROLOGIC:  No dizziness, headaches,  blackout, or seizure.  MUSCULOSKELETAL:  Osteoarthritis.  EYE/ENT:  No  change in her eyesight or hearing.  HEMATOLOGIC:  No problems with  bleeding, clotting disorders, or anemia.   PHYSICAL EXAMINATION:  GENERAL:  She is a well developed African  American female in no acute distress.  VITAL SIGNS:  Her blood pressure was 130/70, pulse 80, respirations 18,  and sats were 95%.  HEENT:  Head is atraumatic.  Eyes, pupils equal and reactive to light  and accommodation.  Extraocular movements normal.  Ears, tympanic  membranes are intact.  Nose, there is no septal deviation.  Throat is  without lesions.  NECK:  Supple without thyromegaly.  There is no supraclavicular or  axillary adenopathy.  CHEST:  Clear  to auscultation and percussion.  HEART:  Regular, sinus rhythm.  No murmur.  ABDOMEN:  Soft.  There is no hepatosplenomegaly.  EXTREMITIES:  Pulses are 2+.  There is no clubbing or edema.   IMPRESSION:  1. Stage II non-small cell lung cancer, left upper lobe.  2. History of tobacco abuse.  3. Chronic obstructive pulmonary disease.  4. Hypertension.  5. Osteoarthritis.  6. Esophageal stricture.  7. History of pericarditis.   PLAN:  Left VATS, left upper lobectomy, and possible left pneumonectomy.      [name], M.D.  Electronically Signed     DPB/MEDQ  D:  08/05/2008  T:  08/05/2008  Job:  [id number]

## 2010-06-30 NOTE — Letter (Signed)
April 02, 2009   Lajuana Matte, MD  [phone]  [place], [place] [address]   Re:  BRESLIN, BURKLOW                 DOB:  1940/04/28   Dear Dr. Arbutus Ped:   The patient returns now after her CT scan.  She is now 9 months since we  did a left upper lobectomy for a stage IB non-small cell lung cancer.  She is doing well overall.  I will let you follow her from now on and I  will be happy to see her again if she has any future problems.  I  appreciate the opportunity of seeing the patient.   Sincerely,   [name], M.D.  Electronically Signed   DPB/MEDQ  D:  04/02/2009  T:  04/03/2009  Job:  [id number]

## 2010-06-30 NOTE — Assessment & Plan Note (Signed)
[facility]                         GASTROENTEROLOGY OFFICE NOTE   NAME:[name], [name]                        MRN:          [id number]  DATE:03/23/2007                            DOB:          1940/05/10    PROBLEM:  1. Dyspepsia.  2. Dysphagia.   REASON:  [name] is pleasant 70 year old African American female  referred through the courtesy of  [name] for evaluation.  She is  complaining of excess flatus with multiple foods.  This is a relatively  recent problem.  She denies change in bowel habits.  She apparently  underwent colonoscopy within the last 10 years.  She denies nausea or  pyrosis.  She is having definite dysphagia to pills.  She requires  liquids to pass them through.  She does deny dysphagia to solids or  liquids.  She takes omeprazole daily.  At one point she had some mild  right upper quadrant discomfort that has since entirely subsided.  Ultrasound shows gallbladder stones.  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   PAST MEDICAL HISTORY:  Pertinent for arthritis and hypertension.  She is  status post hysterectomy.   FAMILY HISTORY:  Pertinent for her father with diabetes and heart  disease.   MEDICATIONS:  Include Fosamax, omeprazole, benazepril, Pravachol,  dicyclomine and Naprosyn.  She is allergic to PENICILLIN, HYDROCODONE AND CODEINE.   She neither smokes nor drinks.  She is widowed and retired.   REVIEW OF SYSTEMS:  Positive for joint point and back pain.   PHYSICAL EXAMINATION:  Pulse 92, blood pressure 158/72, weight 162.  HEENT: EOMI.  PERRLA.  Sclerae are anicteric.  Conjunctivae are pink.  NECK:  Supple without thyromegaly, adenopathy or carotid bruits.  CHEST:  Clear to auscultation and percussion without adventitious  sounds.  CARDIAC:  Regular rhythm; normal S1 S2.  There are no murmurs, gallops  or rubs.  ABDOMEN:  Bowel sounds are normoactive.  Abdomen is soft, nontender and  nondistended.  There are  no abdominal masses, tenderness, splenic  enlargement or hepatomegaly.  EXTREMITIES:  Full range of motion.  No cyanosis, clubbing or edema.  RECTAL:  Deferred.   IMPRESSION:  1. Nonspecific dyspepsia.  I suspect that she is not digesting certain      foods, causing her excessive flatus.  Lactose intolerance may be      significant contributing factor.  2. Dysphagia - rule out early esophageal stricture.   RECOMMENDATION:  1. Continue omeprazole.  2. I instructed [name] to take a careful dietary history when she      is particularly symptomatic.  3. Upper endoscopy with dilatation as indicated.     [name]. Jody Dice, MD,FACG  Electronically Signed    RDK/MedQ  DD: 03/23/2007  DT: 03/23/2007  Job #: [id number]

## 2010-06-30 NOTE — Discharge Summary (Signed)
[name], [name]                 ACCOUNT NO.:  [id number]   MEDICAL RECORD NO.:  [id number]           PATIENT TYPE:   LOCATION:                                 FACILITY:   PHYSICIAN:  [name], M.D. DATE OF BIRTH:  March 27, 1940   DATE OF ADMISSION:  DATE OF DISCHARGE:                               DISCHARGE SUMMARY   FINAL DIAGNOSIS:  Left upper lobe mass.  Positive for invasive  moderately differentiated squamous cell carcinoma presented at bronchial  margin of main specimen.  Second bronchial margins sent showed benign  bronchial wall with chronic inflammation with no evidence of malignancy.  T1b N0 Mx.   IN-HOSPITAL DIAGNOSES:  1. Acute blood loss anemia postoperatively.  2. Hyponatremia postoperatively.  3. Deconditioning postoperatively.   SECONDARY DIAGNOSES:  1. Hyperlipidemia.  2. Hypertension.  3. History of heart murmur.  4. History of osteoarthritis.  5. 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  6. History of esophageal stricture and Barrett esophagus with reflux.  7. History of pericarditis secondary to viral illness in the past.  8. Status post hysterectomy.  9. Status post tubal ligation.   IN-HOSPITAL OPERATIONS AND PROCEDURES:  Left video-assisted  thoracoscopic surgery with left thoracotomy, left upper lobectomy with  lymph node biopsy, intercostal muscle flap and resection of second  bronchial margin.   HISTORY AND PHYSICAL AND HOSPITAL COURSE:  The patient is a 70 year old  previous smoker who was treated with a virus infection in March 2010 and  had a chest x-ray that showed a left lung abnormality.  CT scan done  showed a left lingular consolidation with central obstruction,  mets,  and some precarinal nodes.  The patient underwent bronchoscopy with Dr.  [name] on Jul 04, 2008, and was found to have a totally occluded left  upper lobe orifice with an exophytic lesion with squamous cell  carcinoma.  PET scan showed hypermetabolic area  in the left upper lobe  and no nodal uptake.  The patient denies hemoptysis, fever, chills, or  excessive sputum.  PFTs done showed an FVC 2.14 with an FEV-1 of 1.53,  80% predicted, diffusion capacity 50% with correction of 88%.  After the  patient's bronchoscopy and mediastinoscopy, Dr. [name] discussed with  the patient diagnosis and need to undergo surgery.  The patient asked  for a second opinion and was sent to see Dr. [name].  After discussing  with Dr. [name], undergoing resection of this mass and risks and  benefits, the patient acknowledged understanding and agreed to proceed.  Dr. [name] scheduled the patient for surgery for August 06, 2008.  For  further details of the patient's past medical history and physical exam,  please see dictated H&P.   The patient was taken to the operating room on August 06, 2008, where she  underwent left video-assisted thoracoscopic surgery with left  thoracotomy, left upper lobectomy, lymph node biopsy, intercostal muscle  flap, and resection of second bronchial margin.  This came back positive  showing invasive moderately differentiated squamous cell carcinoma which  was present at the bronchial margin of the main  specimen.  Second  bronchial margin was obtained and showed benign bronchial wall with  chronic inflammation, no evidence of malignancy.  The patient was T1b,  N0, and MX code.  Postoperatively, the patient was transferred to the  Intensive Care Unit.  She was able to be extubated following surgery.  Post-extubation, the patient was noted to be alert and oriented x4.  She  was noted to be hemodynamically stable.  Daily chest x-rays were  obtained.  These were stable with mild atelectasis.  The patient did  have a small air leak postoperatively.  Suction was decreased.  One  chest tube was stable to be discontinued on postop day #2.  Followup  chest x-rays remained stable with no pneumothorax.  The patient  continued to have a small air  leak.  Her chest tube was placed a mini  express.  This air leak resolved.  Chest x-ray was stable.  Remaining  chest tube was discontinued on postop day #6.  Followup chest x-ray  remained stable.  During this time, the patient was encouraged to use  her incentive spirometer.  She was able to be weaned off oxygen,  saturating greater than 90% on room air.  Postoperatively, the patient  did have some mild confusion.  She was started on Haldol and fentanyl  PCA was discontinued.  Percocet was discontinued as well.  The patient's  confusion resolved.  Haldol was discontinued.  Postoperatively, the  patient did have some acute blood loss anemia.  She did receive a unit  of packed red blood cells for hemoglobin of 7.8.  Hemoglobin/hematocrit  were followed and stabilized.  The patient was started on p.o. iron.  Last hemoglobin/hematocrit on postop day #6 was 10.6 and 31.2.  Postoperatively, the patient did develop hyponatremia as well.  Her  sodium dropped to 119.  She was placed on fluid restrictions.  Following  this, her sodium improved, by postop day #7 it was 125 and increasing.  We will continue to follow.  Because of her hyponatremia, the patient  was not restarted on her benazepril/HCTZ.  Her blood pressure was  slightly elevated, but this was followed closely.  We will restart  possibly as an outpatient once hyponatremia remains resolved and stable.  Postoperatively, the patient was noted to be deconditioned.  PT and OT  were consulted.  They were working with the patient and she was  progressing slowly.  Due to her deconditioning and at home care, Social  Worker was consulted for possible placement to skilled nursing facility.  PT and OT both agreed that the patient would benefit from skilled  nursing facility.  Currently, Social Worker is working on placement and  bed offers.   On postop day #8, the patient was noted to be afebrile.  Her vital signs  are stable.  She is saturating  99% on room air.  Most recent lab work  shows sodium of 125, potassium 4.2, chloride of 89, bicarbonate 29, BUN  of 14, creatinine of 0.90, and glucose 94.  White blood cell count 9.7,  hemoglobin 10.6, hematocrit 31.2, and platelet count 206.  All incisions  noted to be clean, dry, and intact and healing well.  All staples were  discontinued from main and left thoracotomy incision.  The patient was  noted to be in normal sinus rhythm.  She was tolerating diet well.  No  nausea or vomiting.  She was up ambulating with assistance.  The patient  is tentatively ready for  transfer to skilled nursing facility once  arrangements made.   FOLLOWUP APPOINTMENTS:  A followup appointment has been arranged with  Dr. [name] for August 21, 2008, at 11:45 a.m..  The patient will need to  obtain PA and lateral chest x-ray 30 minutes prior to this appointment.   ACTIVITY:  The patient is instructed no driving until released to do so,  no lifting over 10 pounds.  She is told to ambulate 3-4 times per day  and to progress as tolerated.  She is continue to work with PT and OT at  [facility].   INCISIONAL CARE:  The patient is allowed to shower.  She is to wash her  incisions using soap and water.  Please contact us if she develops any  drainage or opening from any of her incision sites.   DIET:  The patient is educated diet to be low fat, low salt.   DISCHARGE MEDICATIONS:  1. Pravachol 40 mg daily.  2. Omeprazole 20 mg b.i.d.  3. Darvocet-N 100 one tab q.6 h. p.r.n. pain.  4. Zyrtec 10 mg daily.  5. Ambien 5 mg at night p.r.n.  6. Xanax 0.25 mg b.i.d. p.r.n.  7. Nu-Iron 150 mg daily.  8. Ensure 1 can t.i.d. with milk.      Sol Blazing, PA      [name], M.D.  Electronically Signed    KMD/MEDQ  D:  08/14/2008  T:  08/14/2008  Job:  [id number]

## 2010-06-30 NOTE — Consult Note (Signed)
NEW PATIENT CONSULTATION   [name], [name]  DOB:  1940-07-25                                        Jun 27, 2008  CHART #:  [id number]   REASON FOR CONSULTATION:  Left upper lobe squamous cell carcinoma.   CLINICAL HISTORY:  I was asked by Dr. Delford Field to evaluate the patient in  [facility] today for consideration of surgical treatment of a squamous  cell carcinoma obstructing the lingular segment of the left upper lobe.  She is a 70 year old previous smoker, who is followed by Dr. [name].  She presented with complaints of shortness of breath.  She said she  became ill on April 24, 2008, with a swollen face and neck and felt weak  in general.  She went to the emergency department and was treated for  sinus infection.  She had a chest x-ray performed, which showed the left  upper lobe abnormality.  She was treated with antibiotic and prednisone.  She failed to improve.  She subsequently underwent a CT scan of the  chest on June 06, 2008.  This showed lingular consolidation with  probable central obstructing mass.  There were a few small precarinal  lymph nodes, one measuring 8 mm and one measuring 9 mm.  There were no  distinct pulmonary nodules noted.  There was atherosclerotic  calcification in the aorta and coronary arteries and some calcified  gallstones.  She subsequently underwent bronchoscopy done by Dr. Delford Field  on Jun 20, 2008.  This showed the left upper lobe orifice to be totally  occluded by an exophytic lesion.  Biopsy showed squamous cell carcinoma.  She subsequently underwent a PET scan today which showed marked  hypermetabolic uptake within the left hilar region extending out into  the left upper lobe.  There are no other hypermetabolic areas.   REVIEW OF SYSTEMS:  Her review of systems is as follows.  General:  She  denies any fever or chills.  She does report a recent history of  decreased appetite and weight loss of about 8 pounds, but said  that over  the past week, this has improved.  Eyes:  She has had some recent  changes in her eyesight.  ENT:  Negative.  Endocrine:  She denies  diabetes and hypothyroidism.  Cardiovascular:  She denies any chest pain  or pressure.  She denies any PND or orthopnea.  She has had exertional  dyspnea.  She denies palpitations and peripheral edema.  She has history  of heart murmur.  Respiratory:  She denies cough and sputum production.  She has had no hemoptysis.  She denies wheezing.  GI:  She has history  of reflux and Barrett esophagus.  She denies nausea or vomiting.  She  denies melena and bright red blood per rectum.  She has had no  dysphagia.  GU:  She denies dysuria and hematuria.  Vascular:  She does  report some pains in her legs with walking.  She has no history of DVT.  Neurological:  She denies any focal weakness or numbness.  She denies  dizziness and syncope.  She has never had TIA or stroke.  Musculoskeletal:  She does have history of arthritis.  Hematological:  No history of bleeding disorders.  Psychiatric:  Negative.   ALLERGIES:  Her allergies are amoxicillin, codeine, and  hydrocodone.   CURRENT MEDICATIONS:  Her current medications are Pravachol 40 mg daily,  omeprazole 40 mg daily, Fosamax 70 mg weekly, Bentyl 20 mg b.i.d.,  fluticasone 0.05% p.r.n., Darvocet p.r.n.,  benazepril/hydrochlorothiazide 10/12.5 mg daily, vitamin D3 1000 units  daily, cetirizine hydrochloride 10 mg p.r.n., zolpidem 5 mg nightly  p.r.n., alprazolam 0.25 mg b.i.d. p.r.n., and Flonase 50 mcg 2 puffs  each nostril daily.   SOCIAL HISTORY:  She is widowed.  She has 2 adult children and is here  with her son today.  She quit smoking about 2 years ago and smoked about  half pack a day before that.  She denies alcohol use.   FAMILY HISTORY:  Negative for lung cancer.   Her past medical history is significant for hyperlipidemia and  hypertension.  She has history of heart murmur.  She has  history of  osteoarthritis.  She has history of previous positive TB test.  She has  history of osteoporosis.  She has history of esophageal stricture and  Barrett esophagus with reflux.  She has a history of pericarditis  secondary to viral illness in the past.  She is status post hysterectomy  and status post tubal ligation.   PHYSICAL EXAMINATION:  VITAL SIGNS:  Blood pressure 133/65, pulse 80 and  regular, respiratory rate is 18 and unlabored, and oxygen saturation on  room air is 95%.  GENERAL:  She is a well-developed black female in no distress.  HEENT:  Normocephalic and atraumatic.  Pupils are equal and reactive to  light and accommodation.  Extraocular muscles are intact.  Throat is  clear.  NECK:  Normal carotid pulses bilaterally.  There are no bruits.  There  is no adenopathy or thyromegaly.  CARDIAC:  Regular rate and rhythm with normal S1 and S2.  There is no  murmur, rub, or gallop.  LUNGS:  Clear.  ABDOMEN:  Active bowel sounds.  Abdomen is soft, mildly obese, and  nontender.  There are no palpable masses or organomegaly.  EXTREMITIES:  No peripheral edema.  Pedal pulses are palpable  bilaterally.  SKIN:  Warm and dry.  NEUROLOGIC:  Alert and oriented x3.  Motor and sensory exam is grossly  normal.   Pulmonary function testing shows an FEV-1 of 1.53, which is 80%  predicted.  FVC 2.14, which is 80% predicted.  Diffusion capacity was  88% predicted.   IMPRESSION:  The patient has a left upper lobe squamous cell carcinoma  that reportedly has occluded the orifice of her left upper lobe  bronchus.  Her PET scan shows marked hypermetabolic uptake within the  hilar region extending out into the left upper lobe.  It is not possible  to tell whether she has positive N1 nodes, although I suspect there is  just based on the size of the N1 nodes around the left upper lobe and  left lower lobe proximal bronchi.  I do not think she would be a  candidate for left upper  lobectomy if she has tumor at the orifice of  the left upper lobe bronchus.  I doubt the sleeve resection would be  possible since she has bulky lymphadenopathy around the carina of the  left upper lobe and left lower lobe bronchi.  I think the options would  probably be to perform pneumonectomy or consider definitive chemotherapy  and radiation therapy.  Her pulmonary function test showed that she may  be a candidate for pneumonectomy, although I think she would have  significant limitation after that procedure.  I would certainly perform  full mediastinoscopy and fiberoptic bronchoscopy myself before  considering proceeding with pneumonectomy.  I discussed the options with  her and her son including consideration of pneumonectomy versus  chemotherapy and radiation therapy.  I discussed the high risks involved  in pneumonectomy for her.  They seem to understand the seriousness of  that type of surgery, would like to consider surgical therapy if at all  possible.  I told them that I would schedule a bronchoscopy and  mediastinoscopy next week as well as Cardiology evaluation before  considering proceeding with left pneumonectomy.  We have scheduled this  surgery for Thursday, Jul 04, 2008.  I will see her back following  Tuesday to discuss the results with her.   [name], M.D.  Electronically Signed   BB/MEDQ  D:  06/27/2008  T:  06/28/2008  Job:  [id number]   cc:   Corwin Levins, MD  Lajuana Matte, MD  Lurline Hare, MD  Charlcie Cradle. Delford Field, MD, FCCP

## 2010-06-30 NOTE — Op Note (Signed)
[name]                 ACCOUNT NO.:  [id number]   MEDICAL RECORD NO.:  [id number]          PATIENT TYPE:  INP   LOCATION:  2307                         FACILITY:  [facility]   PHYSICIAN:  [name], M.D. DATE OF BIRTH:  1940/02/24   DATE OF PROCEDURE:  DATE OF DISCHARGE:                               OPERATIVE REPORT   PREOPERATIVE DIAGNOSIS:  Non-small-cell lung cancer, left upper lobe.   POSTOPERATIVE DIAGNOSIS:  Non-small-cell lung cancer, left upper lobe.   OPERATIONS PERFORMED:  1. Left video-assisted thoracic surgery.  2. Left thoracotomy.  3. Left upper lobectomy with node dissection and bronchoplasty and      intercostal muscle flap.   SURGEON:  [name], MD   ANESTHESIA:  General anesthesia.   PROCEDURE:  After general anesthesia, the patient was prepped and draped  in the usual sterile manner.  Was turned to the left lateral thoracotomy  position and dual-lumen tube was inserted and the left lung was  deflated.  Trocar site was made in the anterior axillary line at the  seventh intercostal space.  A 0-degrees scope was inserted and once we  inserted that, we could see that the lingula was stuck to her chest wall  secondary to probably infection.  The patient had a lesion coming out of  the lingula that completely obstructed the lingula.  This has been a  chronic problem, it was squamous cell cancer.  For this reason, we  decided to go with the thoracotomy and a posterolateral thoracotomy  incision was made and dissection was carried down dividing the  latissimus and reflecting the serratus.  Anterior of the fifth  intercostal space was entered.  We took down the intercostal muscle with  electrocautery off between the fifth and sixth ribs.  We then reflected  it medially and secured it medially.  The portion of the fifth rib was  taken at the angle with a Bethune rib shear.  Then, a Tuffier retractor  was inserted.  The dissection was started  superiorly dissecting out some  5 nodes and dissecting out the pulmonary artery then dissected  posteriorly.  The adhesions to the chest wall were taken down with  electrocautery, and then we also took down the adhesions of the left  lower lobe off the chest wall and off the diaphragm.  We turned our  attention medially where we resected the lingula off the pericardium and  dissected the left lower lobe out dividing the inferior pulmonary  ligament.  Several 9L nodes were taken in this dissection.  Attention  was turned superiorly, and we dissected out some 10L nodes and then  dissected out the superior pulmonary vein and looped with a vascular  tape.  I then turned our attention to the fissure, which was stuck  together from the adhesions and first dissected out the superior portion  of the fissure, stapled and divided it with the Auto Suture stapler, a  3.5-mm stapler.  Then, this exposed the 3 branches of the pulmonary  artery and these were dissected free and the apical posterior branch  was  stapled with the Auto Suture, 2-mm stapler and the lingular branch was  stapled with Auto Suture, 2-mm stapler.  The anterior branch was ligated  with 0 silk, clipped and divided.  Then, the bronchus was then dissected  out and dissecting out several 10L nodes.  The bronchus was stapled with  a TA-30 stapler.  Prior to stapling the bronchus, the superior pulmonary  vein was divided with the Auto Suture stapler and then the left upper  lobe was removed.  Unfortunately, the bronchial margin was positive, so  we then resected an additional approximately 8-10 mm of the bronchial  stump and sutured that with interrupted 4-0 Prolene and then placed the  intercostal muscle flap on top of the bronchial stump after making sure  that there was no leak of the water and sutured that in place with 2-0  silk.  Then, 2 chest tubes were placed, one through the old trocar site  and one through a separate stab  wound, sutured in place with 0 silk.  A  Marcaine block  was done in the usual fashion.  Chest was closed with 5 pericostal  drilling through the sixth rib and passed around the fifth rib, #1  Vicryl in the muscle layer, and Ethicon skin clips.  The patient was  returned to the recovery room in stable condition.      [name], M.D.  Electronically Signed     DPB/MEDQ  D:  08/06/2008  T:  08/07/2008  Job:  [id number]   cc:   Charlcie Cradle. Delford Field, MD, FCCP

## 2010-06-30 NOTE — Assessment & Plan Note (Signed)
OFFICE VISIT   SOUDERS, Calinda C  DOB:  1940-12-04                                        January 07, 2009  CHART #:  [id number]   The patient returns today.  She is now 4 months since her surgery which  she did a left upper lobe with a lobectomy with a bronchoplasty and  intercostal muscle flap.  Her incision is well healed.  She is breathing  well today.  Her blood pressure is 117/69, pulse 82, respirations 18,  sats were 97%.  I will plan to see her back again in February when she  has her CT scan.   [name], M.D.  Electronically Signed   DPB/MEDQ  D:  01/07/2009  T:  01/08/2009  Job:  [id number]

## 2010-06-30 NOTE — Op Note (Signed)
[name], [name]                 ACCOUNT NO.:  [id number]   MEDICAL RECORD NO.:  [id number]          PATIENT TYPE:  AMB   LOCATION:  SDS                          FACILITY:  [facility]   PHYSICIAN:  [name], M.D.     DATE OF BIRTH:  1940-02-24   DATE OF PROCEDURE:  07/04/2008  DATE OF DISCHARGE:  07/04/2008                               OPERATIVE REPORT   PREOPERATIVE DIAGNOSIS:  Left upper lobe squamous cell carcinoma.   POSTOPERATIVE DIAGNOSIS:  Left upper lobe squamous cell carcinoma.   PROCEDURES:  1. Flexible fiberoptic bronchoscopy.  2. Mediastinoscopy with lymph node biopsies.   SURGEON:  [name], MD   ANESTHESIA:  General endotracheal.   CLINICAL HISTORY:  This patient is a 70 year old previous smoker who  initially presented with complaints of shortness of breath in March  2010.  She was seen in the emergency department and treated for her  sinus infection.  Chest x-ray showed left upper lobe abnormality and she  was treated with antibiotics and prednisone.  She failed to improve.  She subsequently underwent a CT scan of the chest on June 06, 2008 that  showed lingular consolidation with probable central obstructing mass.  There are few small precarinal lymph nodes.  She subsequently underwent  bronchoscopy by Dr. Delford [name] on Jun 20, 2008 that showed the left upper  lobe orifice to be totally occluded by an exophytic lesion.  Biopsy  showed squamous cell carcinoma.  PET scan was done which showed marked  hypermetabolic uptake within the left hilar region extending out into  the left upper lobe.  There were no other hypermetabolic areas.  Pulmonary function testing showed an FEV-1 of 1.53, which was 80% of  predicted.  FVC was 2.14 which was 80% of predicted.  Diffusion capacity  was 88% of predicted.  After review of all this information, I felt that  the potential options for treatment would be either surgical resection  which may require a sleeve resection of  left upper lobe or left  pneumonectomy, and nonsurgical therapy with chemotherapy and radiation.  I felt that the patient's pulmonary function, age, and clinical status  was marginal for tolerating a pneumonectomy.  I discussed the options  for treatment with the patient and her son and they seemed to have a  good grasp of the underlying problems and considerations.  We decided to  proceed with flexible fiberoptic bronchoscopy to further evaluate the  extent of disease as well as mediastinoscopy with lymph node biopsies.  I discussed the operative procedure with the patient and her son  including alternatives, benefits, and risks including but not limited to  bleeding, blood transfusion, injury to the mediastinal structures,  pneumothorax, and they understood and agreed to proceed.   OPERATIVE PROCEDURE:  The patient was taken to the operating room and  placed on table in supine position.  After induction of general  endotracheal anesthesia using a single-lumen endotracheal tube, flexible  fiberoptic bronchoscopy was performed using the video bronchoscope.  The  distal trachea appeared normal.  The carina appeared sharp.  The right  bronchial tree had normal segmental anatomy with no endobronchial  lesions and no secretions.  The left bronchial tree had normal segmental  anatomy.  The tumor was seen protruding out from the main left upper  lobe bronchus.  This extended to the area of the carina between the  upper and lower lobes.  I did not feel there would be adequate left  lower lobe bronchus to allow a sleeve resection to be safely performed.  I felt that a pneumonectomy would be required for surgical resection of  this lesion.  The bronchoscope was then withdrawn from the patient.   The patient was then prepped for mediastinoscopy.  A roll was placed  beneath the shoulders and neck slightly extended.  The neck and chest  were prepped with Betadine soap and solution and draped in the  usual  sterile manner.  A 2-cm transverse incision was made about 1  fingerbreadth above the sternal notch.  This was carried down through  the subcutaneous tissue using electrocautery.  Strap muscles were  divided in the midline and retracted laterally to expose the trachea.  Pretracheal plane was bluntly developed down in the mediastinum.  The  mediastinoscope was inserted.  There were multiple lymph nodes present.  Lymph node biopsies were taken at her R4, station setting, and L4  levels.  I was not able to get down onto the left mainstem bronchus due  to the large pulmonary artery crossing in this area.  All the specimens  were sent for permanent section.  There was complete hemostasis.  The  mediastinoscope was withdrawn from the patient.  The strap muscles were  reapproximated with interrupted 2-0 Vicryl sutures and the platysma  muscle was closed with interrupted 2-0 Vicryl sutures.  The skin was  closed with a 3-0 Vicryl subcuticular closure.  Dermabond was placed  over the incision.  The sponge, needle, and instrument counts were  correct according to the scrub nurse.  The patient was then awakened,  extubated, and transferred to the Postanesthesia Care Unit in  satisfactory and stable condition.      [name], M.D.  Electronically Signed     BB/MEDQ  D:  07/04/2008  T:  07/05/2008  Job:  [id number]   cc:   Jody Matte, MD

## 2010-06-30 NOTE — Letter (Signed)
July 17, 2008   Charlcie Cradle. Delford Field, MD, FCCP  [address]  [place], [place] [address]   Re:  [name], [name]                 DOB:  1940/12/13   Dear [name],   I saw the patient back today for a second opinion.  Her blood pressure  is 138/78, pulse 91, respirations 18, and saturations 97%.  Her  pulmonary function test, I think are satisfactory for a possible left  pneumonectomy with a diffusion capacity of 88% of predicted.  Her  mediastinoscopy is negative, and her biopsy shows squamous cell cancer  of the left upper lobe.  A bronchoscopy reveals a protruding out of the  left mainstem bronchus.  I reviewed her CT scans and as well as the  operative findings, and I  have recommended that she do have surgery.  I  think there is a slight possibility we could get by with a sleeve  resection of the left upper lobe; if not, she will need a left  pneumonectomy.  I think this would be her best chance of care given that  she is either a stage IA, IB, or IIA.  She will think about this and let  us know.  If she decided to have surgery, we will schedule it with Dr.  [name]; since he will be at [facility], I will be able to do it prior to  his return.  If she decides against surgery, then we will refer her for  radiation and chemotherapy.  I appreciate the opportunity of seeing the  patient.   Sincerely,   [name], M.D.  Electronically Signed   DPB/MEDQ  D:  07/17/2008  T:  07/18/2008  Job:  [id number]   cc:   [name], M.D.

## 2010-06-30 NOTE — Letter (Signed)
September 11, 2008   Corwin Levins, MD  [phone]  [place], [place] [address]   Re:  [name], [name]                 DOB:  May 26, 1940   Dear Dr. Jonny Ruiz:   I saw the patient in followup and we did a left upper lobectomy with  node dissection on her.  She has a small loculated left  hydropneumothorax, but this appears to be improving.  Overall, she is  doing well and is just returned home after being in rehabilitation for a  week or two.  Her incisions are well healed.  Lungs are clear to  auscultation and percussion.  She is gradually improving.  We will see  her back again in 4 weeks with a chest x-ray.  Her blood pressure was up  to a high of  145/73, so we will restart her benazepril and  hydrochlorothiazide, which is 10/12.5 one a day.   I appreciate the opportunity of seeing the patient.    Sincerely,   [name], M.D.  Electronically Signed   DPB/MEDQ  D:  09/11/2008  T:  09/12/2008  Job:  [id number]

## 2010-07-03 NOTE — H&P (Signed)
NAME:  [name], [name]                           ACCOUNT NO.:  [id number]   MEDICAL RECORD NO.:  [id number]                   PATIENT TYPE:  INP   LOCATION:  1828                                 FACILITY:  [facility]   PHYSICIAN:  Isla Pence, M.D.             DATE OF BIRTH:  1940-04-18   DATE OF ADMISSION:  09/02/2003  DATE OF DISCHARGE:                                HISTORY & PHYSICAL   CHIEF COMPLAINT:  This is a 70 year old African-American female whose  primary care physician is Dr. Elsworth Soho.  Her orthopedic doctor is  Dr. Jeannett Senior D. Lucey.  Her chief complaint is nausea, vomiting and diarrhea  which started on Friday evening.   HISTORY OF PRESENT ILLNESS:  The patient notes that she started having  nausea and vomiting and diarrhea on Friday evening, but since then it has  increased in frequency and increased in number.  The patient says that she  woke up today with diarrhea about twice this morning.  Yesterday it was  about three or four times.  On Saturday also it was also about the same.  All of them have been watery.  There has been no melena or hematochezia.  When I asked her about abdominal pain, she says only with gagging or  movement.  It sounded as if the abdominal pain came on about the same time;  however, apparently this abdominal pain has been going on for the past two  or three weeks and she was recently given Vicodin by her primary care  physician.  The patient in addition to the above symptoms has had some  nausea and vomiting since Friday, and she has gone about twice this morning,  three or four times yesterday.  The patient denies any previous similar  episodes.  She denies any known history of gallstones.  She is not a  diabetic.  She has not been able to keep much fluid at all, and has only  pretty much been eating ice chips since Friday.  The patient notes that she  has had about a 2-pound weight loss over quite awhile, she says.  In regards  to her  belly pain, it seems like she has been getting Vicodin and Voltaren,  but she says the Voltaren was not started on her before the belly pain  started.  She was evaluated by general surgery for the possibility of an  acute cholecystectomy-type of picture because of her abdominal pain and  tenderness, and with the abdominal ultrasound showing gallstones, without  any gallbladder wall thickening.  The surgeon, whom I believe was Dr.  ___________, who saw her said that this was not gallbladder-related.  There  was no suggestion of acute cholecystectomy.  The HIDA scan was done, and I  was told by the emergency room physician that this was negative also.  The  surgeon felt that maybe this was more of  a gastroenteritis or question of a  gastroenteritis.   ALLERGIES:  PENICILLIN AND CODEINE, which both give her an itch.   CURRENT MEDICATIONS:  1. Hyoscine 0.375 mg ER b.i.d.  2. Protonix 40 mg p.o. daily.  She has been on this for awhile.  She has     never had an EGD that I can gather from when I asked her.  3. Hydrochlorothiazide 25 mg p.o. daily.  4. Flonase two sprays in each nostril daily.  5. Vicodin 5/500 mg, one tab p.o. q.4-6h. p.r.n. neck and back pain.  6. Voltaren 75 mg p.o. b.i.d.  7. Skelaxin 800 mg p.o. t.i.d. p.r.n. spasms.  8. She has also been prescribed promethazine 25 mg p.o. q.4-6h. p.r.n.     nausea and vomiting.  The prescription on the bottle is dated August 28, 2003.   PAST MEDICAL/SURGICAL HISTORY:  1. Significant for a history of an abnormal Pap smear that was persistent.  2. She is status post a total abdominal hysterectomy with bilateral salpingo-     oophorectomy by Dr. Galen Daft in February 2004.  3. History of gastroesophageal reflux disease.  She denies having had an     EGD.  4. She underwent a cardiac catheterization by Dr. Lyn Records for     pericardial effusion, I believe.  That showed no suggestion of     pericardial constriction.  Her coronaries were  normal.  Her ejection     fraction at that time was 60%.  She had normal LV size and contractions.     She was admitted in July 2002, for the chest pain and the pericardial     effusion.  The echocardiogram did show mild AI at that time.  5. She also has a history of irritable bowel syndrome.  6. History of arthritis.  7. History of hypertension.  8. History of seasonal allergies.  9. She recently had an MRI of the C-spine and lumbar spine that showed     significant disease along the cervical spine all the way from C3 through     T1, with spinal stenosis being mentioned in a few places, also bilateral     foraminal stenosis.  10.      She also had on August 31, 2003, a left knee MRI, and also a right     shoulder MRI.  The left knee:  There was a question of whether she has     left medial meniscus tear in the midportion, and on the MRI of the     shoulder, suggestion of right supraspinatus tear.  She is scheduled to     see Dr. Mila Homer. Lucey on September 05, 2003.   PAST SURGICAL HISTORY:  1. As mentioned, the total abdominal hysterectomy with bilateral salpingo-     oophorectomy.  She still has her appendix.  2. She had some epistaxis with cauterization of treatment done many years     ago.   SOCIAL HISTORY:  She is married since 63.  She has two kids.  She works as  a Electrical engineer and has been off for the past week because of the abdominal  pains, I think.  Otherwise she smokes 1/2 pack a day for the past 12-15  years.  She quit drinking years ago as a teenager.  She says, like a lot of  teenagers, she used to drink.  She does not do any form of exercise  regularly, but she  is a security guard.  She had a sigmoidoscopy a while  back, and she was told it was normal.  She has not had an EGD.   REVIEW OF SYSTEMS:  As per the HPI.  She otherwise notes that she does have  neck pain that comes down to her shoulders.  She has a sense of numbness mostly in her hands and her fingertips,  which sometimes can change color, to  being white, and she has to rub it before the color comes back.  She also  has some sense of numbness in her legs.  She points to the lateral portion  of her legs.  She otherwise does not have trouble grabbing things or  dropping things.   PHYSICAL EXAMINATION:  VITAL SIGNS:  Her initial blood pressure was 148/94,  temperature 98 degrees, respirations 18, pulse 80.  Repeat pulse was 88.  She is saturating at 98% on room air.  Her repeat blood pressure is 138/64.  GENERAL:  She did receive Compazine in the emergency room, so she is a  little drowsy, but she is able to give quite a bit of her history.  She does  not appear to be in any distress.  NECK:  She has tenderness along her trapezius muscles, otherwise there is no  spinous tenderness in the cervical area, but she does have some spinous  tenderness at about the lower thoracic and upper lumbar areas.  There is no  CVA angle tenderness.  LUNGS:  Clear to auscultation bilaterally without any crackles or wheezes.  HEART:  A regular rate and rhythm.  Question of whether she could have a  very soft murmur in the left upper sternal border.  I certainly do not hear  one at the right upper sternal border.  ABDOMEN:  Bowel sounds are normal.  She is kind of tender in all four  quadrants, the least being in the right lower quadrant.  There is no  rebound.  There is no distention either.  NEUROLOGIC:  Cranial nerves II-XII are grossly intact.  Her muscle strength  in her grip is +4/5 and her left is +5/5.  She has +4/5 in her lower  extremity muscle strength, the left being slightly stronger than the right.  I did not check her deep tendon reflexes or her toes if they were downgoing.   LABORATORY DATA:  A CBC shows a white count that is normal at 6.2, H&H 11.5  and 33.6 respectively, MCV is 98.2, which is normal, platelet count 272.  Her differential is normal.  Her BMET shows a sodium of 132, potassium  4.8,  chloride and CO2 are 101 and 16 respectively, glucose 79, BUN 16, creatinine  1.5.  Calcium 9.2, total protein 6.7, albumin slightly decreased at 2.9, AST  12 and ALT 15, alkaline phosphatase normal, total bilirubin normal, lipase  19.  Her urinalysis is fairly unremarkable except for being positive for  ketones and bilirubin, with a specific gravity of 1.019.  Her ultrasound showed gallstones without any gallbladder wall thickening, as  previously mentioned.  As I have mentioned, surgery has also seen her.   ASSESSMENT/PLAN:  1. I doubt she has gallstones or acute cholecystitis, which is what the     surgeon has said also.  I do believe she has a strong suggestion for     gastroenteritis, causing some of her nausea and vomiting and diarrhea.     She certainly has evidence of dehydration with her being  mildly acidotic.     Will go ahead and bring her in for a 24-hour observation, to give her IV     fluids and antiemetics. 2. In regards to her metabolic acidosis, this is a normal gap acidosis, most     likely related to her nausea, vomiting and diarrhea; and IV fluids are to     correct that.  3. In regards to her mild hyponatremia, this is most likely secondary to     dehydration and also with the hydrochlorothiazide on board, IV fluids     should correct that, and will hold HCTZ for now.  4. In regards to her abdominal pain, it is kind of diffuse abdominal pain.     It is hard to tell.  Certainly some of it would go along with the     gastroenteritis; however, this apparently has been going on for a lot     longer than the nausea and vomiting and diarrhea.  Question of whether     she might have gastritis.  I have gone ahead and held her Voltaren or     Diclofenac for that reason.  Will increase her Protonix to 40 mg b.i.d.,     but give it to her IV.  Question of whether she could have referred pain     from her disk disease.  It is always a possibility, especially since the      pain is actually worse with movement or gagging, although she certainly     is tender on examination, and once again it could go along with the     gastroenteritis with her belly being just acutely tender.  Will hold her     Diclofenac and do the Protonix for now.  5. In regards to her normocytic and normochromic anemia, expect her     hemoglobin will drop further with IV hydration.  She has never been told     she has been anemic before.  Will do iron studies, stool tests, heme test     her stools, check a TSH, B12 and folate.  The Protonix ought to help.  6. In regards to her cervical and lumbar spine disease, at this point in     time she does not need any inpatient intervention; however, she will need     most likely PT and OT as an outpatient.  Will leave this to Dr. Clovis Riley,     to give a referral.  She certainly has evidence of stenosis, but     clinically does not appear to be needing surgical intervention at this     point in time.  Will continue her Vicodin, but am holding her Voltaren,     secondary to the abdominal pain.  7. In regards to the other musculoskeletal problems, the rotator cuff and     the knee findings on MRI, she already has an appointment with Dr. Sherlean Foot.     This can be addressed at that time.  There is no emergent need for her to     get this done as an inpatient.  8. In regards to deep vein thrombosis prophylaxis, we will go ahead and do     Lovenox.  9. In regards to a history of hypertension, I am going to hold the HCTZ for     now, and watch her blood pressure.  10.      In regards to her history of irritable bowel, I am going to  continue her Hyoscine.  As to whether some of her pain is related to the     irritable bowel acting up, even the diarrhea is always a possibility.                                                Isla Pence, M.D.    RRV/MEDQ  D:  09/02/2003  T:  09/02/2003  Job:  [id number]   cc:   L. Lupe Carney, M.D.  [address] [place] [address]  Fax: [phone]   Mila Homer. Sherlean Foot, M.D.  [address] [place]  [place] [address]  Fax: [phone]

## 2010-07-03 NOTE — Discharge Summary (Signed)
   NAME:  [name], [name]                           ACCOUNT NO.:  [id number]   MEDICAL RECORD NO.:  [id number]                   PATIENT TYPE:  INP   LOCATION:  9326                                 FACILITY:  [facility]   PHYSICIAN:  Ronda Fairly. Galen Daft, M.D.              DATE OF BIRTH:  March 13, 1940   DATE OF ADMISSION:  03/27/2002  DATE OF DISCHARGE:  03/29/2002                                 DISCHARGE SUMMARY   ADMISSION DIAGNOSES:  1. Abnormal Pap smear.  2. Persistent dysplasia.   DISCHARGE DIAGNOSES:  1. Abnormal Pap smear.  2. Persistent dysplasia.   COMPLICATIONS:  None.   CONDITION ON DISCHARGE:  Stable.   HOSPITAL COURSE:  The patient was admitted for total abdominal hysterectomy  and bilateral salingo-oophorectomy on February 10.  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  Other options were  addressed with the patient and she wished to have definitive surgery.  This  was done by abdominal surgery because there was also an anticipated 2 cm  ovarian lesion, solid tumor, of unknown etiology.  Under laparotomy, this  was found to be not evident.  However, there was some calcification on  pathology, otherwise benign ovaries.  The uterus itself was unremarkable.  There was no persistent dysplasia on the uterine specimen.  The patient did  very well in the postoperative period.  Her hemoglobin postoperatively was  11 g with a hematocrit of 32.2 and a white count of 7000.  Her temperature  was initially on postop day #1 up to 102.1, 98.1 on the day of discharge and  was afebrile for the day of discharge.  She was discharged to home with full  instructions regarding activity limits, wound care, followup in the office  and Zithromax for suspected bronchitis.   DISCHARGE MEDICATIONS:  1. Zithromax.  2. Percocet.  3. Motrin.   FOLLOW UP:  Follow up in the office for postoperative check in one week.  Call earlier if any fever develops, wound problems  or other complications.                                               Ronda Fairly. Galen Daft, M.D.    NJT/MEDQ  D:  04/03/2002  T:  04/03/2002  Job:  [id number]

## 2010-07-03 NOTE — Discharge Summary (Signed)
[place]. [facility]  Patient:    KESTREL, MIS                        MRN: [id number] Adm. Date:  [id number] Disc. Date: [id number] Attending:  Lyn Records. Iii Dictator:   Anselm Lis, N.P. CCAbran Cantor. Clovis Riley, M.D., primary care Harlene Petralia   Discharge Summary  PROCEDURES: 1. (September 02, 2000) A 2-D echocardiogram revealing normal LV function, EF    55 to 65% without regional wall motion abnormalities.  Left ventricular    wall thickness upper limits of normal.  Mild aortic regurgitation grade 1    on a scale of 0 to 4.  Small pericardial effusion, circumferential to the    heart. 2. (September 02, 2000) Chest CT with and without contrast:  Negative pulmonary    emboli, no evidence of thoracic aortic dissection. Cardiomegaly with    suspicion of vascular congestion.  No evidence of DVT. 3. (September 05, 2000) Adenosine Cardiolite negative for ischemia or scar. EF    61%.  DISCHARGE DIAGNOSES:  1. Chest discomfort of uncertain etiology; suspect musculoskeletal.  Ruled     out myocardial infarction, negative serial cardiac enzymes with     nonischemic EKG.  Chest CT negative for pulmonary embolus nor thoracic     aortic dissection.  Though chest x-ray revealed cardiomegaly, 2-D echo     revealed normal left ventricular function, ejection fraction 55 to 65%     without regional wall motion abnormalities.  Ventricular wall thickness     upper limits of normal.  Adenosine Cardiolite was negative for ischemia     nor scar (myocardial).  2. Renal insufficiency, question related to nonsteroidal anti-inflammatory     drug use; serum creatinine at time of discharge was increased to 2.2 (from     baseline of 1.2). This may be related to nonsteroidal anti-inflammatory     drug use (Daypro). She had also received brief course of Indocin while in     hospital. Dye insult from chest CAT scan on admission may also an     etiology. She will be discharged off her  nonsteroidal anti-inflammatory     drug with follow-up basic metabolic panel in approximately one weeks     time.  3. Tobacco use:  Ongoing one half pack-per-day for 20 years.  4. Mild congestive heart failure, diuresed single dose IV Lasix. Question     related to diastolic dysfunction setting of admission blood pressure     201/110.  5. Hypertension:  Admission blood pressure of 201/110, subsequently 140/65     while in the emergency room.  During the course of admission, blood     pressure ranging from 123/60 to 164/80.  She was counseled on a low salt     diet with further blood pressure management per primary care.  6. History of gastroesophageal reflux disease:  Managed on Protonix during     the course of admission. Discharged home on her ranitidine as prior to     admission.  7. History of irritable bowel syndrome; currently quiescent.  8. History of arthritis with left knee tendinitis.  9. History of tubal ligation. 10. Seasonal allergies. 11. Lactose intolerance.  PLAN: 1. The patient discharged home in stable condition. 2. Discharge medications:    a. Ranitidine 150 mg p.o. b.i.d.    b. Daypro; the patient instructed to discontinue  this medication.    c. Flonase one puff each naris q.d.    d. Claritin 10 mg p.o. q.d. 3. Diet:  Low fat, low cholesterol, no added salt. 4. Follow-up:  Darci Needle, M.D., p.r.n.  Dr. Lupe Carney:  The    patient to arrange to be seen next available.  BRIEF HOSPITAL COURSE:  Ms. Cassel is a pleasant 70 year old overweight female with history of tobacco use who, while watching TV, developed a sudden onset lower/mid/epigastric tightness/sharp discomfort which was constant and radiated to left upper quadrant around to her back, extending up to the back of her neck. She has associated shortness of breath.  Discomfort was exacerbated with sitting forward or leaning back and also with turning to her side or taking deep breath. She  presented to primary care physician where EKG revealed NSR, frequent APCs and no ischemic changes.  There as no significant change from her baseline. She presented to [facility] Emergency Room where EKG was still okay. Blood pressure initially 201/110, then decreasing to the 140s/70s after two sublingual nitrates. She had slight improvement in the chest pain on IV nitrates and felt like her lower jaw was "chattering/locking up" somewhat.  She had a history of a right anterior/shoulder discomfort approximately two months ago with her jaw "locking up on her" for which she was evaluated at [facility] Emergency Room where work-up was inconclusive.  EKG was okay and she was discharged from the emergency room at that time.  Emergency room EKG revealed evidence of mild CHF and cardiac enlargement. She was evaluated by 2-D echo while in the emergency room to ascertain if evidence of pericardial effusion perhaps reflective of pericarditis.  That 2-D echo did reveal a very small amount of pericardial circumferential fluid but otherwise normal LV function with upper limits of normal left ventricular wall thickness.  Good ejection fraction as above.  She underwent a chest CT with and without contrast media to rule out thoracic aneurysm or pulmonary emboli which was negative for those problems.  She ruled out for myocardial infarction by negative serial cardiac enzymes, though her CK was slightly elevated ranging 236 to 301, her MB fraction was flat, and her troponin I was flat x 3.  She stayed in the hospital over the weekend and on Monday, September 05, 2000, underwent adenosine Cardiolite to rule out evidence of myocardial ischemia for etiology of chest discomfort. That study was negative for myocardial ischemia or scar with good ejection fraction of 61%.  Course of hospital admission and explanation studies was discussed with patient and her family members. She was discharged home with plans  to follow up with Dr. Lupe Carney to further evaluate right shoulder/arm discomfort, following her  cholesterol on a low fat diet and consideration for blood pressure management.  LABORATORY TESTS AND DATA:  Admission wbc of 8.8 with hemoglobin 12.8 and hematocrit 37.3, platelets 238.  Protime 13.1 with INR of 1, PTT 60.  Sodium 140, potassium 4.3, chloride 107, CO2 27, glucose 88, BUN 20, creatinine 1.2. LFTs within normal range.  First CK of 301 with MB fraction 1.7, troponin I 0.03; second CK of 268 with MB fraction 0.9, troponin I 0.03; third CK 236, MB fraction 1, troponin I 0.02. Statin profile:  Cholesterol 211, triglycerides 138, HDL 39, LDL 144.  At the time of discharge, September 05, 2000, the patients sodium was 138, potassium 4.1, chloride 102, CO2 23, glucose 108, BUN 35, creatinine 2.2.  Admission chest x-ray  revealed mild CHF, cardiac enlargement. DD:  09/06/00 TD:  09/07/00 Job: [id number]

## 2010-07-03 NOTE — Op Note (Signed)
NAMESHANEE, BATCH                 ACCOUNT NO.:  [id number]   MEDICAL RECORD NO.:  [id number]          PATIENT TYPE:  AMB   LOCATION:  ENDO                         FACILITY:  [facility]   PHYSICIAN:  Danise Edge, M.D.   DATE OF BIRTH:  Jul 08, 1940   DATE OF PROCEDURE:  04/30/2004  DATE OF DISCHARGE:                                 OPERATIVE REPORT   PROCEDURE:  Screening colonoscopy.   INDICATIONS:  Ms. [name] is a 70 year old female born November 07, 1940. Ms.  [name] is scheduled to undergo her first screening colonoscopy with  polypectomy to prevent colon cancer.   ENDOSCOPIST:  Danise Edge, M.D.   PREMEDICATION:  Versed 6 mg, Demerol 60 mg.   PROCEDURE:  After obtaining informed consent (including the risk of  colonoscopy and polypectomy), Ms. [name] was placed in the left lateral  decubitus position. I administered intravenous Demerol and intravenous  Versed to achieve conscious sedation for the procedure. The patient's blood  pressure, oxygen saturation and cardiac rhythm were monitored throughout the  procedure and documented in the medical record.   Anal inspection and digital rectal exam were normal. The Olympus adjustable  pediatric colonoscope was introduced into the rectum and advanced to the  cecum. Colonic preparation for the exam today was excellent.   Ms. [name] has universal colonic diverticulosis without diverticulitis or  diverticular stricture formation.   RECTUM:  Normal.  SIGMOID COLON AND DESCENDING COLON:  Normal.  SPLENIC FLEXURE:  Normal.  TRANSVERSE COLON:  Normal.  HEPATIC FLEXURE:  Normal.  ASCENDING COLON:  Normal.  CECUM AND ILEOCECAL VALVE:  Normal.   ASSESSMENT:  Normal screening proctocolonoscopy to the cecum.      MJ/MEDQ  D:  04/30/2004  T:  04/30/2004  Job:  [id number]   cc:   L. Lupe Carney, M.D.  [address] [place]  [place] [address]  Fax: [phone]

## 2010-07-03 NOTE — Cardiovascular Report (Signed)
. [facility]  Patient:    TEMPEST, FRANKLAND Visit Number: [id number] MRN: [id number]          Service Type: CAT Location: [facility] 2899 11 Attending Physician:  Lyn Records. Iii Dictated by:   Darci Needle, M.D. Proc. Date: 10/25/00 Admit Date:  10/25/2000   CC:         Melvyn Neth D. Clovis Riley, M.D.   Cardiac Catheterization  INDICATION:  Pericarditis.  History of CHF.  This study is being done to rule out constrictive pericardial disease.  PROCEDURES PERFORMED: 1. Left heart catheterization. 2. Right heart catheterization. 3. Left ventriculography. 4. Coronary angiography.  DESCRIPTION:  After informed consent, an 8-French sheath was inserted into the right femoral vein and a 6-French sheath into the right femoral artery. Xylocaine 2% was used.  A 6-French multipurpose catheter used for left heart hemodynamic recordings.  A 7-French angled Swan-Ganz catheter was used for right heart pressure determinations.  Simultaneous LV RA, LV RV, LV PA, and LV pulmonary capillary wedge pressure determinations were made.  Pullback pressure was recorded throughout the right heart.  Left ventriculography was performed using hand injection.  The pullback pressure across the aortic valve was then recorded.  Selective left and right coronary angiography was performed using the multipurpose catheter on the right coronary and a #4 6-French left Judkins catheter on the left coronary. The patient tolerated the diagnostic procedure without complications.  RESULTS: I.   Hemodynamic data:      a. Right atrial mean pressure 5 mmHg and during inspiration, 2 mmHg.         V Wave 6 mmHg.      b. RV pressure 26/6.      c. PA pressure 27/14.      d. Pulmonary capillary wedge pressure mean 9 mmHg with a V wave of 17.      e. Aortic pressure 113/58 mmHg.      f. Left ventricular pressure 120/11 mmHg. II.  Left ventriculography:  The left ventricle is normal size and  demonstrates normal contractility.  EF is 60%. III. Coronary angiography.      a. Left main coronary:  Normal.      b. Left anterior descending coronary: Markedly tortuous.  Large         diagonal branch arises proximally.  It is also very tortuous.  No         significant obstruction is noted.      c. Circumflex artery: The circumflex artery is tortuous and trifurcates.      d. Right coronary:  The right coronary is large and trifurcates on the         inferior wall.  It is normal.  CONCLUSIONS: 1. Normal coronary arteries. 2. Normal left ventricular function. 3. No hemodynamic data to support the diagnosis of pericardial constriction.  PLAN: 1. Taper and discontinued prednisone. 2. Return to work. 3. Call if problems. Dictated by:   Darci Needle, M.D. Attending Physician:  Lyn Records. Iii DD:  10/25/00 TD:  10/25/00 Job: [id number]

## 2010-07-03 NOTE — H&P (Signed)
[place]. [facility]  Patient:    [name], [name]                          MRN: [id number] Adm. Date:  09/02/00 Attending:  Darci Needle, M.D. Dictator:   Anselm Lis, N.P. CC:         Lupe Carney, M.D.   History and Physical  PRIMARY CARE PHYSICIAN:  Lupe Carney, M.D.  IMPRESSION: 1. Acute onset of left chest and back discomfort, with pleuritic    component.  Rule out CAD/MI.  Rule out aortic dissection and    pericardial disease. 2. Congestive heart failure, mild by chest x-ray.  Questioned etiology.    Rule out hypertensive cardiovascular disease. 3. History of gastroesophageal reflux disease.  On Zantac. 4. Tobacco abuse. 5. History of irritable bowel syndrome, currently quiescent. 6. Seasonal allergies. 7. History of tubal ligation. 8. Arthritis/left knee tendonitis. 9. Hypertension, in ER denies history.  Her blood pressure was initially    elevated 201/110 and subsequently 140/65 after sublingual and intravenous    nitroglycerin.  PLAN: 1. Obtain 2d echocardiogram, assessing for pericardial effusion. 2. IV nitrates. 3. If no regional wall motion abnormalities by 2d echocardiogram, will    plan chest CAT scan with and without contrast to check aortic artery. 4. IV Lasix.  Follow I/O and daily weight. 5. Protonix.  HISTORY OF PRESENT ILLNESS:  Ms. [name] is a pleasant 70 year old overweight female with a history of tobacco use, who, while watching TV developed the sudden onset of lower/mid/epigastric tightness/sharp discomfort radiating to the left upper quadrant, around to the back and extending up the back of her neck.  It started at approximately 10 a.m. this morning and has continued since.  It hurts worse to take a deep breath, to lean forward or lean back, or with moving her torso.  She has complained of orthopnea.  She reported to her primary care physician, where EKG there revealed normal sinus rhythm with frequent PVCs  and occasional TBCs, but overall is nonischemic and without significant change in baseline.  At [facility] Emergency Room her EKG was unchanged.  Initial blood pressure was elevated at 201/110, subsequently 140s/70s after she took sublingual nitroglycerin.   She did have slight improvement in chest pain post sublingual nitroglycerin and IV nitroglycerin.  She does feel like her lower jaw is "chattering/locking up" somewhat.  The patient had a past complaint of right anterior/shoulder discomfort, with jaw "locking up on her" approximately two months ago.  She reported to [facility] Emergency Room, where a workup there (including an EKG) was inconclusive.  Otherwise, she denies any problems with chest pain, DOE, pedal edema nor usually any orthopnea.  ALLERGIES:  LACTOSE intolerance, PENICILLIN (causing pruritus), CODEINE (intolerance).  No problems with seafood, shellfish nor with INA products. Certain foods, such as cabbage, cause gas.  SOCIAL HISTORY:  The patient married for 42 years.  Tobacco -- 1/2 pack per day for 20 years.  ETOH -- Negative.  Caffeine -- Not excessive.  She has one daughter and one son, alive and well.  FAMILY HISTORY:  Negative for CAD.  REVIEW OF SYSTEMS:  As in HPI and past medical history; otherwise denies problems with dysphagia to food or fluid.  Poor dentition, stating all her teeth need to be pulled out.  She does wear glasses.  Denies melena.  No bright red blood per rectum.  No  constipation nor diabetes.  Denies dysuria and hematuria.  PHYSICAL EXAMINATION:  VITAL SIGNS:  Initial blood pressure 201/110, current 140s/60s.  Heart rate 70s.  Respiratory rate 12, O2 saturation pending.  GENERAL:  She is an overweight 70 year old female in moderate discomfort from her chest and back.  No obvious respiratory distress.  HEENT:  Normocephalic, atraumatic.  Sclerae clear.  NECK:  Without bruits.  No JVD.  No  thyromegaly.  CHEST:  Rales at base.  HEART:  Irregular rhythm; no murmurs, rubs or gallops.  ABDOMEN:  Soft, nondistended; normal bowel sounds.  ___________.  EXTREMITIES:  Negative pedal edema.  Femoral 2+ bilaterally.  Decreased dorsalis pedis and posterior tibial on right.  NEUROLOGIC:  Cranial nerves 2-12 grossly intact.  Alert and oriented x 3.  GENITAL/RECTAL:  Deferred.  LABORATORY TESTS:  Sodium 140, potassium 4.3, chloride 107, CO2 27, BUN 20, creatinine 1.2, glucose 88.  LFT within normal range.  Hemoglobin 12.8, hematocrit 37.3, WC 8.8, platelets 238.  CK 301, ______ giving relative index of only 0.6.  Troponin I 0.03.  Prothrombin time 13.1, with INR 1.0, PTT 60.  EKG:  Normal sinus rhythm, with occasional PVC/APCs, nonischemic.  No significant change from baseline in 1997.  CHEST X-RAY:  Reveals cardiac enlargement, with mild CHF. DD:  09/02/00 TD:  09/02/00 Job: [id number]

## 2010-07-03 NOTE — Op Note (Signed)
NAME:  [name], [name]                           ACCOUNT NO.:  [id number]   MEDICAL RECORD NO.:  [id number]                   PATIENT TYPE:  INP   LOCATION:  9326                                 FACILITY:  [facility]   PHYSICIAN:  Ronda Fairly. Galen Daft, M.D.              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   PROCEDURE:  Total abdominal hysterectomy/bilateral salpingo-oophorectomy.   SURGEON:  Ronda Fairly. Galen Daft, M.D.   ASSISTANT:  Rudy Jew. Ashley Royalty, M.D.   COMPLICATIONS:  None.   ESTIMATED BLOOD LOSS:  100 cc.   ANESTHESIA:  General anesthesia.   DESCRIPTION OF PROCEDURE:  The patient was identified and informed consent  was reviewed prior to bringing her to the operating room.  She received  sequential compression boots, prophylactic heparin earlier in the day as  well as postoperatively.  The patient received prophylactic antibiotic,  single dose Ancef prior to surgery.  The patient had Betadine prep sterile  technique and general anesthesia administered.  The Foley catheter was  indwelling throughout the procedure.  A Pfannenstiel incision was utilized  for abdominal access.  There were no difficulties getting into the abdomen.  The self retaining O'Connor-O'Sullivan retractor was utilized.  The uterus  was postmenopausal in size.  The right ovary had a small solid lesion  approximately 1 cm, otherwise unremarkable.  Left ovary was unremarkable.  Tubes were unremarkable.  There were no significant adhesions.  The utero-  ovarian ligaments were grasped with a Tresa Endo on either side for uterine  manipulation.  Round ligament was grasped, divided and ligated with 0 Vicryl  suture.  The anterior  peritoneum overlying the uterus and the bladder flap  was developed and the bladder was retracted downward well below the surgical  planes.  Similarly, the medial leaf of this broad ligament was also  evaluated and divided to evaluate and keep separate and distinct ureters  from the utero-ovarian and infundibulopelvic ligaments.  The  infundibulopelvic ligaments were separately identified, separate and  distinct from other structures and they were clamped, divided and ligated  with 0 tie followed by 0 suture ligature.  Complete hemostasis was noted.  The uterine vessels were skeletonized and the uterine vessels were clamped,  divided and ligated with 0 Vicryl.  Successive clamps down the cardinal  ligament complex was performed in the uterosacral ligament complex.  The  uterus was then removed without difficulty and the cuff was closed with 0  Vicryl suture.  The cuff was supported to the uterosacral cardinal ligament  complex with separate sutures.  This achieved good support.  There was  complete hemostasis noted in  the abdomen.  The abdomen was irrigated and  inspection for hemostasis was noted to be complete.  The patient had a total  blood loss of 100 cc.  All subfascial tissues were hemostatic.  Packs were  removed and retractors were removed.  All sponge, needle and instrument  counts were correct.  Again, inspection for hemostasis was noted to be  complete as the fascia was closed with #1 Vicryl from either side.  The  subcutaneous tissues were irrigated, hemostatic and the deeper subcutaneous  tissues were  reapproximated with three sutures of interrupted 0 Vicryl and the skin was  closed with subcuticular closure with 3-0 Monocryl followed by Steri-Strips.  Bladder was clear and yellow throughout the procedure with the Foley  catheter.  There was no injury.  The patient tolerated the procedure well  and left the operating room in stable condition.                                                Ronda Fairly. Galen Daft, M.D.    NJT/MEDQ  D:  03/27/2002  T:  03/27/2002  Job:  [id number]   cc:   Fayrene Fearing A. Ashley Royalty, M.D.  [address]  [place], [place] [address]  Fax: [phone]

## 2010-07-03 NOTE — Consult Note (Signed)
NAME:  [name], [name]                           ACCOUNT NO.:  [id number]   MEDICAL RECORD NO.:  [id number]                   PATIENT TYPE:  EMS   LOCATION:  MINO                                 FACILITY:  [facility]   PHYSICIAN:  Gabrielle Dare. Janee Morn, M.D.             DATE OF BIRTH:  [birth year]   DATE OF CONSULTATION:  09/02/2003  DATE OF DISCHARGE:                                   CONSULTATION   REASON FOR CONSULTATION:  Gallstones and abdominal pain.   HISTORY OF PRESENT ILLNESS:  The patient is a 70 year old, African-American  female who developed some nausea, vomiting and diarrhea on Friday.  This  continued somewhat through the weekend with trouble keeping much down.  She  also complains of some epigastric, right upper quadrant and left upper  quadrant abdominal pain.  She has no other complaints and has no history of  right upper quadrant pain episodes in the past.  She came to the [facility]  Emergency Department today and workup here has shown a white blood cell  count of 6.2.  Liver function tests and lipase are within normal limits.  She underwent ultrasound which demonstrated gallstones and no  pericholecystic fluid and no gallbladder wall thickening.   PAST MEDICAL HISTORY:  1. Arthritis.  2. Hypertension.  3. GERD.  4. Dysphagia.   PAST SURGICAL HISTORY:  1. Hysterectomy.  2. Bilateral tubal ligation.   CURRENT MEDICATIONS:  Protonix, hydrochlorothiazide, Flonase, hydrocodone,  diclofenac sodium.   ALLERGIES:  PENICILLIN and CODEINE.   SOCIAL HISTORY:  She does not smoke.   REVIEW OF SYMPTOMS:  GENERAL:  Negative.  CARDIAC:  Negative.  PULMONARY:  Negative.  GASTROINTESTINAL:  See HPI.  GU:  Negative.  MUSCULOSKELETAL:  History of arthritis.   PHYSICAL EXAMINATION:  VITAL SIGNS:  Temperature 98.0, blood pressure  138/64, pulse 88, respirations 18.  GENERAL:  She is awake and alert.  HEENT:  Pupils are equal.  Sclerae nonicteric.  NECK:  Supple.  LUNGS:  Clear to  auscultation bilaterally.  HEART:  Regular rate and rhythm.  PMI in the left chest.  ABDOMEN:  Soft.  She has some epigastric and left upper quadrant tenderness  as well as some right upper quadrant tenderness all with no guarding.  She  seemed a bit more tender in the epigastric than in any quadrant.  No  organomegaly or other masses are noted.  SKIN:  Warm and dry with no rashes.  EXTREMITIES:  No significant edema.   LABORATORY DATA AND X-RAY FINDINGS:  White blood cell count 6.2, hemoglobin  11.5, platelets 272.  Sodium 132, potassium 4.8, chloride 101, BUN 16,  creatinine 1.5.  AST 12, ALT 15, Alk phos 70, bilirubin 1.0.  Lipase 19.   Ultrasound results are as above.   IMPRESSION:  Nausea, vomiting, diarrhea and abdominal pain.  This is not a  clear case for cholecystitis at this time.  RECOMMENDATIONS:  1. I agree with IV fluid resuscitation and medical admission.  2. We will check a HIDA scan today.  If this is positive, we will plan on     proceeding with cholecystectomy.  If it is negative, I suspect this is     more of a gastroenteritis and medical admission with IV fluid     resuscitation and bowel rest will be warranted.  The plan was discussed     in detail with the patient and family members and we will check the HIDA     scan.                                               Gabrielle Dare Janee Morn, M.D.    BET/MEDQ  D:  09/02/2003  T:  09/02/2003  Job:  [id number]

## 2010-07-03 NOTE — Discharge Summary (Signed)
[name], [name]                           ACCOUNT NO.:  [id number]   MEDICAL RECORD NO.:  [id number]                   PATIENT TYPE:  INP   LOCATION:  5523                                 FACILITY:  [facility]   PHYSICIAN:  Jackie Plum, M.D.             DATE OF BIRTH:  Jun 18, 1940   DATE OF ADMISSION:  09/02/2003  DATE OF DISCHARGE:  09/03/2003                                 DISCHARGE SUMMARY   DISCHARGE DIAGNOSES:  1. Abdominal pain, nausea, vomiting, resolved, secondary to probable     gastritis/gastroenteritis.  2. Hyponatremia, outpatient follow-up.  3. History of arthritis.  4. Hypertension.  5. Gastroesophageal reflux disease.  6. Dysphagia.  7. Cholelithiasis.   DISCHARGE MEDICATIONS:  The patient is to resume her Protonix,  hydrochlorothiazide, Flonase, hydrocodone, and diclofenac as previously.   ACTIVITY:  As tolerated.   DIET:  A low-salt diet.   She needs to follow up with her primary care physician, Dr. Clovis Riley, in  about 10 days.   SPECIAL INSTRUCTIONS:  She is to call her M.D. if she experiences any  problems, including but not limited to recurrence of her symptoms, i.e.,  abdominal pain, nausea and vomiting, or heartburn.   CONSULTATIONS:  Gabrielle Dare. Janee Morn, M.D.   DIAGNOSTIC WORKUP:  Ultrasound of the abdomen revealed cholelithiasis  without other ultrasonic evidence of cholecystitis of bile duct obstruction.  This was done on September 02, 2003.  Follow-up HIDA scan demonstrated patency of  the cystic ducts.   CONDITION ON DISCHARGE:  Improved and satisfactory.   DISCHARGE DISPOSITION:  Home with her family.   REASON FOR HOSPITALIZATION:  The patient is a 70 year old lady who presented  with mainly upper abdominal pain with nausea, vomiting, and diarrhea of four  days' duration.  Her liver function testing on presentation was within  normal limits.  According to the H&P by Isla Pence, M.D., her  hemodynamics were stable, abdominal exam  showed normal bowel sounds with  some tenderness in all four quadrants.  There was no distention.  Her lab  data showed normal white count with hemoglobin of 11.5 and hematocrit of  33.6.  Sodium of 132, potassium of 4.8, with a CO2 of 16.  Her creatinine  was 1.5.  The LFTs were unremarkable.  His UA was negative for any urinary  tract infection.  She was therefore admitted for further evaluation and  treatment of her abdominal pain, nausea and vomiting.   HOSPITAL COURSE:  The patient was admitted to the hospitalist service.  She  received supplementary IV fluids with antiemetics and GI cocktail.  On  rounds this morning [name] fells good.  She has been able to eat her  breakfast without any problem, and she denies any abdominal pain, nausea or  vomiting today.   PHYSICAL EXAMINATION:  VITAL SIGNS:  Her vital signs are notable for a blood  pressure of 136/58, pulse rate  of 75, respiratory rate of 18, temperature of  98.6 degrees Fahrenheit, saturation of 100% on room air.  GENERAL:  She is well-looking, not acutely ill-looking.  She is not in acute  cardiopulmonary distress.  NECK:  Supple, no JVD.  CHEST:  Lungs were clear to auscultation.  ABDOMEN:  Soft, nontender, bowel sounds were present.  There were no masses.  EXTREMITIES:  Negative for any edema.   She was therefore deemed appropriate for discharge today.   Note, on admission the patient was seen by Dr. Violeta Gelinas, who  recommended a follow-up HIDA scan to the normal gallbladder scan for acute  findings.  CT scan was negative as noted above, and the patient is going  home today with a presumptive diagnosis of gastroenteritis with possibility  of gastritis as a potential.  She will go home to continue her medications  as mentioned above.   The patient's labs this morning, have a WBC of 6.0, hemoglobin of 10.0,  hematocrit of 29.4, MCV of 97.6, platelet count of 269.  Her sodium is 131,  potassium is 4.9, chloride  is 102, CO2 of 17, glucose 60, BUN 14, creatinine  1.4.  Her bilirubin total is 1.0, alkaline phosphatase is 78, SGOT 12, SGPT  15, total protein 6.7, albumin 2.9, calcium 8.5.  Her TSH is 2.408.  Vitamin  B12 is 1420, folate is 12.2, ferritin is 530.                                                Jackie Plum, M.D.    GO/MEDQ  D:  09/03/2003  T:  09/03/2003  Job:  [id number]   cc:   L. Lupe Carney, M.D.  [address] [place]  [place] [address]  Fax: [phone]   Gabrielle Dare. Janee Morn, M.D.  [facility]  [address] [place], [place] [address]  Fax: [phone]

## 2010-07-09 ENCOUNTER — Encounter: Payer: Self-pay | Admitting: Pulmonary Disease

## 2010-07-09 ENCOUNTER — Ambulatory Visit (INDEPENDENT_AMBULATORY_CARE_PROVIDER_SITE_OTHER): Payer: Medicare Other | Admitting: Pulmonary Disease

## 2010-07-09 VITALS — BP 136/82 | HR 79 | Temp 98.0°F | Ht 62.0 in | Wt 173.8 lb

## 2010-07-09 DIAGNOSIS — J449 Chronic obstructive pulmonary disease, unspecified: Secondary | ICD-10-CM

## 2010-07-09 NOTE — Progress Notes (Signed)
  Subjective:    Patient ID: [name], female    DOB: 1940/02/22, 70 y.o.   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  Pre-op FEV1 was 1.53 (80%) & DLCO 48%  CT 03/24/09 no recurrence  Off Spiriva since 2/11 since very expensive.   Jun 30, 2009 1:43 PM  only needed rescue inhaler once.  Rpt PFTs >> FEv1 66%, FVC 63%, DLCO 44%   07/09/2010 Last seen jan '12  - given z-pak, Ct jan'12 reviewed  Stopped spiriva  The patient complains of shortness of breath, but denies chest tightness, chest pain worse with breathing and coughing, wheezing, cough, mucous production, nocturnal awakening, exercise induced symptoms, and congestion     Review of Systems Pt denies any significant  nasal congestion or excess secretions, fever, chills, sweats, unintended wt loss, pleuritic or exertional cp, orthopnea pnd or leg swelling.  Pt also denies any obvious fluctuation in symptoms with weather or environmental change or other alleviating or aggravating factors.    Pt denies any increase in rescue therapy over baseline, denies waking up needing it or having early am exacerbations or coughing/wheezing/ or dyspnea       Objective:   Physical Exam Gen. Pleasant, well-nourished, in no distress ENT - no lesions, no post nasal drip Neck: No JVD, no thyromegaly, no carotid bruits Lungs: no use of accessory muscles, no dullness to percussion, clear without rales or rhonchi  Cardiovascular: Rhythm regular, heart sounds  normal, no murmurs or gallops, no peripheral edema Musculoskeletal: No deformities, no cyanosis or clubbing          Assessment & Plan:

## 2010-07-09 NOTE — Patient Instructions (Signed)
Use albuterol inhaler as needed. °

## 2010-07-10 NOTE — Assessment & Plan Note (Signed)
Stay on albuterol as needed If worse symptoms, will add LABA in future & consider pulm rehab.

## 2010-07-27 ENCOUNTER — Other Ambulatory Visit: Payer: Self-pay

## 2010-07-27 MED ORDER — FUROSEMIDE 40 MG PO TABS: 40.0000 mg | ORAL_TABLET | Freq: Every day | ORAL | Status: DC

## 2010-07-27 MED ORDER — PRAVASTATIN SODIUM 40 MG PO TABS: 40.0000 mg | ORAL_TABLET | Freq: Every day | ORAL | Status: DC

## 2010-07-29 ENCOUNTER — Other Ambulatory Visit: Payer: Self-pay

## 2010-07-29 MED ORDER — POLYSACCHARIDE IRON COMPLEX 150 MG PO CAPS: 150.0000 mg | ORAL_CAPSULE | Freq: Two times a day (BID) | ORAL | Status: DC

## 2010-08-26 ENCOUNTER — Telehealth: Payer: Self-pay | Admitting: *Deleted

## 2010-08-26 MED ORDER — OXYCODONE HCL 5 MG PO TABS: 5.0000 mg | ORAL_TABLET | Freq: Four times a day (QID) | ORAL | Status: DC | PRN

## 2010-08-26 NOTE — Telephone Encounter (Signed)
Pt informed, Rx in cabinet for pt pick up  

## 2010-08-26 NOTE — Telephone Encounter (Signed)
Done hardcopy to dahlia/LIM B  

## 2010-08-26 NOTE — Telephone Encounter (Signed)
Pt requesting refill for pain medication.

## 2010-09-09 ENCOUNTER — Telehealth: Payer: Self-pay | Admitting: *Deleted

## 2010-09-09 NOTE — Telephone Encounter (Signed)
Called patient back, she informed her nose is swollen and has had some nose bleeds as well. I informed she would need to schedule an OV with Dr. Jonny Ruiz as he would not call in antibiotics without being seen. She agreed and scheduled appointment 09/10/2010 at 11:15 with Dr. Jonny Ruiz

## 2010-09-09 NOTE — Telephone Encounter (Signed)
Pt left vm - She says she has a nose infection and req MD to call in the antibiotic to [facility]. Do not see recent OV, may need to get more info from patient.

## 2010-09-10 ENCOUNTER — Encounter: Payer: Self-pay | Admitting: Internal Medicine

## 2010-09-10 ENCOUNTER — Ambulatory Visit (INDEPENDENT_AMBULATORY_CARE_PROVIDER_SITE_OTHER): Payer: Medicare Other | Admitting: Internal Medicine

## 2010-09-10 ENCOUNTER — Telehealth: Payer: Self-pay | Admitting: *Deleted

## 2010-09-10 VITALS — BP 130/72 | HR 71 | Temp 98.6°F | Ht 62.0 in | Wt 173.1 lb

## 2010-09-10 DIAGNOSIS — J019 Acute sinusitis, unspecified: Secondary | ICD-10-CM

## 2010-09-10 DIAGNOSIS — I10 Essential (primary) hypertension: Secondary | ICD-10-CM

## 2010-09-10 DIAGNOSIS — J309 Allergic rhinitis, unspecified: Secondary | ICD-10-CM

## 2010-09-10 DIAGNOSIS — R040 Epistaxis: Secondary | ICD-10-CM

## 2010-09-10 MED ORDER — AZITHROMYCIN 250 MG PO TABS: ORAL_TABLET | ORAL | Status: AC

## 2010-09-10 NOTE — Assessment & Plan Note (Signed)
Mild to mod, for antibx course,  to f/u any worsening symptoms or concerns 

## 2010-09-10 NOTE — Assessment & Plan Note (Signed)
stable overall by hx and exam, most recent data reviewed with pt, and pt to continue medical treatment as before  BP Readings from Last 3 Encounters:  09/10/10 130/72  07/09/10 136/82  06/09/10 148/70

## 2010-09-10 NOTE — Assessment & Plan Note (Signed)
Mild, self limited, ? Related to sinus infection;  To follow for now, consider ENT if persists

## 2010-09-10 NOTE — Telephone Encounter (Signed)
Pt states that pharmacy called her shortly after leaving message stating her medication was ready for pick up.

## 2010-09-10 NOTE — Assessment & Plan Note (Signed)
.  mild uncontrolled, ok also for OTC claritin or allegra prn ,  to f/u any worsening symptoms or concerns

## 2010-09-10 NOTE — Patient Instructions (Addendum)
Take all new medications as prescribed Continue all other medications as before You can also use allegra 180 mg (OTC) for allergies as needed Please call if the nose bleed keeps occuring, as you will need to see ENT Please call [facility] regarding evaluation for the back, knees, and ankles

## 2010-09-10 NOTE — Progress Notes (Signed)
Subjective:    Patient ID: [name], female    DOB: 16-Jan-1941, 70 y.o.   MRN: [id number]  HPI Here with 3 days acute onset fever, facial pain, pressure, general weakness and malaise, and greenish d/c, with slight ST, but little to no cough and Pt denies chest pain, increased sob or doe, wheezing, orthopnea, PND, increased LE swelling, palpitations, dizziness or syncope.  Had 2 episode last night and this AM very mild nosebleed , readily stopped.  Pt denies new neurological symptoms such as new headache, or facial or extremity weakness or numbness  Pt denies polydipsia, polyuria  Pt states overall good compliance with meds, trying to follow lower cholesterol diet, wt overall stable but little exercise however.   Pt continues to have recurring LBP without change in severity, bowel or bladder change, fever, wt loss,  worsening LE pain/numbness/weakness, gait change or falls, despite also chronic recurring pain to knees and ankles.  Plans to call [place] orth[facility] soon.Denies worsening depressive symptoms, suicidal ideation, or panic, though has ongoing anxiety, not increased recently.  Does have several wks ongoing nasal allergy symptoms with clear congestion, itch and sneeze, without fever, pain, ST, cough or wheezing.   Past Medical History  Diagnosis Date  . Cancer     lung ca  . 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 HYPERLIPIDEMIA 11/18/2006  . HYPERKALEMIA 02/05/2008  . ANEMIA-IRON DEFICIENCY 09/22/2008  . Anemia of other chronic disease 02/05/2008  . ANXIETY 06/03/2008  . DEPRESSION 04/28/2009  . HYPERTENSION 11/18/2006  . PERICARDITIS 01/03/2007  . AORTIC STENOSIS/ INSUFFICIENCY, NON-RHEUMATIC 12/03/2008  . SINUSITIS- ACUTE-NOS 11/20/2007  . SINUSITIS, CHRONIC 02/05/2008  . ALLERGIC RHINITIS 11/18/2006  . PNEUMONIA 05/02/2007  . C O P D 06/27/2008  . Stricture and stenosis of esophagus 11/14/2008  . GERD 01/03/2007  . BARRETTS ESOPHAGUS 11/14/2008  . RENAL INSUFFICIENCY 02/05/2008  .  DYSPEPSIA 03/23/2007  . PRURITUS 10/08/2008  . OSTEOARTHRITIS 11/18/2006  . OSTEOARTHRITIS, KNEE, LEFT 01/03/2007  . Cervicalgia 01/13/2009  . SPONDYLOSIS, CERVICAL, WITH RADICULOPATHY 01/13/2009  . BACK PAIN 02/04/2009  . BURSITIS, LEFT HIP 04/18/2009  . OSTEOPOROSIS 11/18/2006  . INSOMNIA-SLEEP DISORDER-UNSPEC 06/03/2008  . FATIGUE 04/18/2009  . PERIPHERAL EDEMA 02/13/2009  . MURMUR 11/18/2006  . DYSPHAGIA UNSPECIFIED 03/23/2007  . Abdominal pain, generalized 03/01/2007  . Nonspecific (abnormal) findings on radiological and other examination of body structure 06/03/2008  . Tuberculin Test Reaction 11/18/2006  . Acute bronchitis 02/23/2010  . CHOLELITHIASIS 04/24/2010  . NEPHROLITHIASIS, HX OF 04/24/2010  . Polyarthralgia 06/09/2010  . Elevated sed rate 06/11/2010  . Positive ANA (antinuclear antibody) 06/11/2010  . Rheumatoid factor positive 06/11/2010   Past Surgical History  Procedure Date  . Abdominal hysterectomy   . Tubal ligation   . Tumor removal 08/06/08    from lungs    reports that she quit smoking about 4 years ago. Her smoking use included Cigarettes. She has a 60 pack-year smoking history. She has quit using smokeless tobacco. She reports that she does not drink alcohol or use illicit drugs. family history includes Coronary artery disease in her other; Hyperlipidemia in her other; and Hypertension in her other. Allergies  Allergen Reactions  . Amoxicillin     REACTION: unknown reaction  . Codeine     REACTION: rash/hives  . Fentanyl     REACTION: hallucinations  . Hydrocodone     REACTION: ineffective  . Penicillins     REACTION: rash/hives   Current Outpatient Prescriptions on File Prior to Visit  Medication Sig Dispense Refill  . albuterol (PROVENTIL HFA) 108 (90 BASE) MCG/ACT inhaler Inhale into the lungs 4 (four) times daily as needed.        Marland Kitchen alendronate (FOSAMAX) 70 MG tablet Take 70 mg by mouth every 7 (seven) days. Take with a full glass of water on an empty stomach.        . ALPRAZolam (XANAX) 0.5 MG tablet Take 0.5 mg by mouth 2 (two) times daily as needed.        . Cholecalciferol (VITAMIN D3) 1000 UNITS CAPS Take by mouth daily.        . furosemide (LASIX) 40 MG tablet Take 1 tablet (40 mg total) by mouth daily.  90 tablet  3  . iron polysaccharides (NIFEREX) 150 MG capsule Take 1 capsule (150 mg total) by mouth 2 (two) times daily.  60 capsule  11  . omeprazole (PRILOSEC) 20 MG capsule Take 20 mg by mouth daily.        Marland Kitchen oxyCODONE (OXY IR/ROXICODONE) 5 MG immediate release tablet Take 1 tablet (5 mg total) by mouth every 6 (six) hours as needed.  120 tablet  0  . pravastatin (PRAVACHOL) 40 MG tablet Take 1 tablet (40 mg total) by mouth daily.  90 tablet  3  . zolpidem (AMBIEN) 5 MG tablet Take 1 tablet (5 mg total) by mouth at bedtime as needed for sleep.  30 tablet  5   Review of Systems Review of Systems  Constitutional: Negative for diaphoresis and unexpected weight change.  HENT: Negative for drooling and tinnitus.   Eyes: Negative for photophobia and visual disturbance.  Respiratory: Negative for choking and stridor.   Gastrointestinal: Negative for vomiting and blood in stool.  Genitourinary: Negative for hematuria and decreased urine volume.       Objective:   Physical Exam BP 130/72  Pulse 71  Temp(Src) 98.6 F (37 C) (Oral)  Ht 5\' 2"  (1.575 m)  Wt 173 lb 2 oz (78.529 kg)  BMI 31.66 kg/m2  SpO2 95% Physical Exam  VS noted, mild ill appearing Constitutional: Pt appears well-developed and well-nourished.  HENT: Head: Normocephalic.  Right Ear: External ear normal.  Left Ear: External ear normal.  Eyes: Conjunctivae and EOM are normal. Pupils are equal, round, and reactive to light.  Neck: Normal range of motion. Neck supple.  Cardiovascular: Normal rate and regular rhythm.   Pulmonary/Chest: Effort normal and breath sounds normal.  Abd:  Soft, NT, non-distended, + BS Neurological: Pt is alert. No cranial nerve deficit.  Motor/dtr/gait intact Skin: Skin is warm. No erythema.  Psychiatric: Pt behavior is normal. Thought content normal. 1+ nervous Spine nontender        Assessment & Plan:

## 2010-09-10 NOTE — Telephone Encounter (Signed)
Patient requesting a call regarding RF of med.

## 2010-09-17 ENCOUNTER — Other Ambulatory Visit: Payer: Self-pay | Admitting: Internal Medicine

## 2010-09-17 ENCOUNTER — Encounter (HOSPITAL_BASED_OUTPATIENT_CLINIC_OR_DEPARTMENT_OTHER): Payer: Medicare Other | Admitting: Internal Medicine

## 2010-09-17 ENCOUNTER — Ambulatory Visit (HOSPITAL_COMMUNITY)
Admission: RE | Admit: 2010-09-17 | Discharge: 2010-09-17 | Disposition: A | Discharge status: Home / Self Care | Payer: Medicare Other | Source: Ambulatory Visit | Attending: Internal Medicine | Admitting: Internal Medicine

## 2010-09-17 DIAGNOSIS — C3490 Malignant neoplasm of unspecified part of unspecified bronchus or lung: Secondary | ICD-10-CM | POA: Insufficient documentation

## 2010-09-17 DIAGNOSIS — C3410 Malignant neoplasm of upper lobe, unspecified bronchus or lung: Secondary | ICD-10-CM

## 2010-09-17 LAB — CMP (CANCER CENTER ONLY)
AST: 15 U/L (ref 11–38)
Albumin: 3.4 g/dL (ref 3.3–5.5)
Alkaline Phosphatase: 51 U/L (ref 26–84)
BUN, Bld: 11 mg/dL (ref 7–22)
Calcium: 9.7 mg/dL (ref 8.0–10.3)
Chloride: 101 mEq/L (ref 98–108)
Creat: 1.3 mg/dl — ABNORMAL HIGH (ref 0.6–1.2)
Glucose, Bld: 90 mg/dL (ref 73–118)

## 2010-09-17 LAB — CBC WITH DIFFERENTIAL/PLATELET
Basophils Absolute: 0 10*3/uL (ref 0.0–0.1)
EOS%: 3.2 % (ref 0.0–7.0)
Eosinophils Absolute: 0.2 10*3/uL (ref 0.0–0.5)
HCT: 34.6 % — ABNORMAL LOW (ref 34.8–46.6)
HGB: 11.5 g/dL — ABNORMAL LOW (ref 11.6–15.9)
MCH: 33.3 pg (ref 25.1–34.0)
MCV: 100.2 fL (ref 79.5–101.0)
MONO%: 8.8 % (ref 0.0–14.0)
NEUT#: 3.2 10*3/uL (ref 1.5–6.5)
NEUT%: 64.3 % (ref 38.4–76.8)

## 2010-09-22 ENCOUNTER — Encounter (HOSPITAL_BASED_OUTPATIENT_CLINIC_OR_DEPARTMENT_OTHER): Payer: Medicare Other | Admitting: Internal Medicine

## 2010-09-22 ENCOUNTER — Other Ambulatory Visit: Payer: Self-pay | Admitting: Internal Medicine

## 2010-09-22 DIAGNOSIS — C3490 Malignant neoplasm of unspecified part of unspecified bronchus or lung: Secondary | ICD-10-CM

## 2010-09-22 DIAGNOSIS — C3410 Malignant neoplasm of upper lobe, unspecified bronchus or lung: Secondary | ICD-10-CM

## 2010-09-22 DIAGNOSIS — N289 Disorder of kidney and ureter, unspecified: Secondary | ICD-10-CM

## 2010-09-28 ENCOUNTER — Encounter: Payer: Self-pay | Admitting: Internal Medicine

## 2010-09-28 ENCOUNTER — Ambulatory Visit (INDEPENDENT_AMBULATORY_CARE_PROVIDER_SITE_OTHER): Payer: Medicare Other | Admitting: Internal Medicine

## 2010-09-28 VITALS — BP 132/60 | HR 79 | Temp 98.6°F | Ht 62.0 in | Wt 171.2 lb

## 2010-09-28 DIAGNOSIS — M5416 Radiculopathy, lumbar region: Secondary | ICD-10-CM

## 2010-09-28 DIAGNOSIS — M5412 Radiculopathy, cervical region: Secondary | ICD-10-CM | POA: Insufficient documentation

## 2010-09-28 DIAGNOSIS — R894 Abnormal immunological findings in specimens from other organs, systems and tissues: Secondary | ICD-10-CM

## 2010-09-28 DIAGNOSIS — R768 Other specified abnormal immunological findings in serum: Secondary | ICD-10-CM

## 2010-09-28 DIAGNOSIS — IMO0002 Reserved for concepts with insufficient information to code with codable children: Secondary | ICD-10-CM

## 2010-09-28 HISTORY — DX: Radiculopathy, lumbar region: M54.16

## 2010-09-28 HISTORY — DX: Radiculopathy, cervical region: M54.12

## 2010-09-28 MED ORDER — OXYCODONE-ACETAMINOPHEN 7.5-325 MG PO TABS: 1.0000 | ORAL_TABLET | Freq: Four times a day (QID) | ORAL | Status: DC | PRN

## 2010-09-28 NOTE — Assessment & Plan Note (Signed)
New onset x 2 wks - for LS spine mri, refer NS

## 2010-09-28 NOTE — Progress Notes (Signed)
Subjective:    Patient ID: [name], female    DOB: 1940-04-24, 70 y.o.   MRN: [id number]  HPI  Here with worsening 2-3 days - acute on chronic recurrent it seems, severe , cnat sleep at night, about 10/10 for pain;  To the right hand and wrist , is assoc with numbness, has some stiffness, ? More weak than usual in the past few days.  Pt continues to have new x 2 wks left  LBP without change in severity but moderate, no bowel or bladder change, fever, wt loss,  worsening LE numbness/weakness, gait change or falls.  Pt denies chest pain, increased sob or doe, wheezing, orthopnea, PND, increased LE swelling, palpitations, dizziness or syncope.  Pt denies new neurological symptoms such as new headache, or facial or extremity weakness or numbness except for the above.  Also with mult other arthralgias, did see rheum at [facility], stated no SLE but pt wants second opionion. Past Medical History  Diagnosis Date  . Cancer     lung ca  . 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 HYPERLIPIDEMIA 11/18/2006  . HYPERKALEMIA 02/05/2008  . ANEMIA-IRON DEFICIENCY 09/22/2008  . Anemia of other chronic disease 02/05/2008  . ANXIETY 06/03/2008  . DEPRESSION 04/28/2009  . HYPERTENSION 11/18/2006  . PERICARDITIS 01/03/2007  . AORTIC STENOSIS/ INSUFFICIENCY, NON-RHEUMATIC 12/03/2008  . SINUSITIS- ACUTE-NOS 11/20/2007  . SINUSITIS, CHRONIC 02/05/2008  . ALLERGIC RHINITIS 11/18/2006  . PNEUMONIA 05/02/2007  . C O P D 06/27/2008  . Stricture and stenosis of esophagus 11/14/2008  . GERD 01/03/2007  . BARRETTS ESOPHAGUS 11/14/2008  . RENAL INSUFFICIENCY 02/05/2008  . DYSPEPSIA 03/23/2007  . PRURITUS 10/08/2008  . OSTEOARTHRITIS 11/18/2006  . OSTEOARTHRITIS, KNEE, LEFT 01/03/2007  . Cervicalgia 01/13/2009  . SPONDYLOSIS, CERVICAL, WITH RADICULOPATHY 01/13/2009  . BACK PAIN 02/04/2009  . BURSITIS, LEFT HIP 04/18/2009  . OSTEOPOROSIS 11/18/2006  . INSOMNIA-SLEEP DISORDER-UNSPEC 06/03/2008  . FATIGUE 04/18/2009  . PERIPHERAL  EDEMA 02/13/2009  . MURMUR 11/18/2006  . DYSPHAGIA UNSPECIFIED 03/23/2007  . Abdominal pain, generalized 03/01/2007  . Nonspecific (abnormal) findings on radiological and other examination of body structure 06/03/2008  . Tuberculin Test Reaction 11/18/2006  . Acute bronchitis 02/23/2010  . CHOLELITHIASIS 04/24/2010  . NEPHROLITHIASIS, HX OF 04/24/2010  . Polyarthralgia 06/09/2010  . Elevated sed rate 06/11/2010  . Positive ANA (antinuclear antibody) 06/11/2010  . Rheumatoid factor positive 06/11/2010   Past Surgical History  Procedure Date  . Abdominal hysterectomy   . Tubal ligation   . Tumor removal 08/06/08    from lungs    reports that she quit smoking about 4 years ago. Her smoking use included Cigarettes. She has a 60 pack-year smoking history. She has quit using smokeless tobacco. She reports that she does not drink alcohol or use illicit drugs. family history includes Coronary artery disease in her other; Hyperlipidemia in her other; and Hypertension in her other. Allergies  Allergen Reactions  . Amoxicillin     REACTION: unknown reaction  . Codeine     REACTION: rash/hives  . Fentanyl     REACTION: hallucinations  . Hydrocodone     REACTION: ineffective  . Penicillins     REACTION: rash/hives   Current Outpatient Prescriptions on File Prior to Visit  Medication Sig Dispense Refill  . albuterol (PROVENTIL HFA) 108 (90 BASE) MCG/ACT inhaler Inhale into the lungs 4 (four) times daily as needed.        Marland Kitchen alendronate (FOSAMAX) 70 MG tablet Take 70 mg by  mouth every 7 (seven) days. Take with a full glass of water on an empty stomach.       . ALPRAZolam (XANAX) 0.5 MG tablet Take 0.5 mg by mouth 2 (two) times daily as needed.        . Cholecalciferol (VITAMIN D3) 1000 UNITS CAPS Take by mouth daily.        . furosemide (LASIX) 40 MG tablet Take 1 tablet (40 mg total) by mouth daily.  90 tablet  3  . iron polysaccharides (NIFEREX) 150 MG capsule Take 1 capsule (150 mg total) by mouth 2  (two) times daily.  60 capsule  11  . omeprazole (PRILOSEC) 20 MG capsule Take 20 mg by mouth daily.        Marland Kitchen oxyCODONE (OXY IR/ROXICODONE) 5 MG immediate release tablet Take 1 tablet (5 mg total) by mouth every 6 (six) hours as needed.  120 tablet  0  . pravastatin (PRAVACHOL) 40 MG tablet Take 1 tablet (40 mg total) by mouth daily.  90 tablet  3  . zolpidem (AMBIEN) 5 MG tablet Take 1 tablet (5 mg total) by mouth at bedtime as needed for sleep.  30 tablet  5   Review of Systems Review of Systems  Constitutional: Negative for diaphoresis and unexpected weight change.  HENT: Negative for drooling and tinnitus.   Eyes: Negative for photophobia and visual disturbance.  Respiratory: Negative for choking and stridor.   Gastrointestinal: Negative for vomiting and blood in stool.  Genitourinary: Negative for hematuria and decreased urine volume.        Objective:   Physical Exam BP 132/60  Pulse 79  Temp(Src) 98.6 F (37 C) (Oral)  Ht 5\' 2"  (1.575 m)  Wt 171 lb 4 oz (77.678 kg)  BMI 31.32 kg/m2  SpO2 90% Physical Exam  VS noted Constitutional: Pt appears well-developed and well-nourished.  HENT: Head: Normocephalic.  Right Ear: External ear normal.  Left Ear: External ear normal.  Eyes: Conjunctivae and EOM are normal. Pupils are equal, round, and reactive to light.  Neck: Normal range of motion. Neck supple.  Cardiovascular: Normal rate and regular rhythm.   Pulmonary/Chest: Effort normal and breath sounds normal.  Abd:  Soft, NT, non-distended, + BS Neurological: Pt is alert. No cranial nerve deficit. motor with 4+/5 grip strength, LLE with 4+/5 distal LLE strength as well Skin: Skin is warm. No erythema.  Psychiatric: Pt behavior is normal. Thought content normal.  Has mild tender to lower cervical spine tender, and diffuse lower lumbar spineal and paravertebral tender    Assessment & Plan:

## 2010-09-28 NOTE — Patient Instructions (Addendum)
Take all new medications as prescribed Continue all other medications as before You will be contacted regarding the referral for: rheumatology locally, the neurosurgeon and MRI's for the neck and lower back

## 2010-09-28 NOTE — Assessment & Plan Note (Signed)
Pt without confidence in first diagnosis per rheum - will refer to local rheum instead

## 2010-09-28 NOTE — Assessment & Plan Note (Signed)
New onset severe for 2-3 days - for MRI cspine and refer NS as well

## 2010-09-30 ENCOUNTER — Other Ambulatory Visit: Payer: Self-pay

## 2010-09-30 MED ORDER — POLYSACCHARIDE IRON COMPLEX 150 MG PO CAPS: 150.0000 mg | ORAL_CAPSULE | Freq: Two times a day (BID) | ORAL | Status: DC

## 2010-09-30 MED ORDER — ZOLPIDEM TARTRATE 5 MG PO TABS: 5.0000 mg | ORAL_TABLET | Freq: Every evening | ORAL | Status: DC | PRN

## 2010-09-30 NOTE — Telephone Encounter (Signed)
Faxed hardcopy to [facility] at [phone]

## 2010-10-02 ENCOUNTER — Other Ambulatory Visit: Payer: Medicare Other

## 2010-10-03 ENCOUNTER — Other Ambulatory Visit: Payer: Medicare Other

## 2010-10-16 ENCOUNTER — Other Ambulatory Visit: Payer: Self-pay | Admitting: Neurosurgery

## 2010-10-16 DIAGNOSIS — M542 Cervicalgia: Secondary | ICD-10-CM

## 2010-10-16 DIAGNOSIS — M545 Low back pain: Secondary | ICD-10-CM

## 2010-10-21 ENCOUNTER — Telehealth: Payer: Self-pay

## 2010-10-21 MED ORDER — OXYCODONE-ACETAMINOPHEN 7.5-325 MG PO TABS: 1.0000 | ORAL_TABLET | Freq: Four times a day (QID) | ORAL | Status: DC | PRN

## 2010-10-21 NOTE — Telephone Encounter (Signed)
Called the patient and she is requesting a refill on the pain medication that was prescribed at her last OV [facility]

## 2010-10-21 NOTE — Telephone Encounter (Signed)
Pt called requesting refill, did not name medication.

## 2010-10-21 NOTE — Telephone Encounter (Signed)
Please clarify

## 2010-10-21 NOTE — Telephone Encounter (Signed)
Done hardcopy to dahlia/LIM B  

## 2010-10-22 NOTE — Telephone Encounter (Signed)
Called patient informed that prescription requested is at front desk ready for pickup.

## 2010-10-24 ENCOUNTER — Ambulatory Visit
Admission: RE | Admit: 2010-10-24 | Discharge: 2010-10-24 | Disposition: A | Discharge status: Home / Self Care | Payer: Medicare Other | Source: Ambulatory Visit | Attending: Neurosurgery | Admitting: Neurosurgery

## 2010-10-24 DIAGNOSIS — M542 Cervicalgia: Secondary | ICD-10-CM

## 2010-10-24 DIAGNOSIS — M545 Low back pain: Secondary | ICD-10-CM

## 2010-10-27 ENCOUNTER — Emergency Department (HOSPITAL_COMMUNITY)
Admission: EM | Admit: 2010-10-27 | Discharge: 2010-10-28 | Disposition: A | Discharge status: Home / Self Care | Payer: Medicare Other | Attending: Emergency Medicine | Admitting: Emergency Medicine

## 2010-10-27 ENCOUNTER — Emergency Department (HOSPITAL_COMMUNITY): Payer: Medicare Other

## 2010-10-27 DIAGNOSIS — Z85118 Personal history of other malignant neoplasm of bronchus and lung: Secondary | ICD-10-CM | POA: Insufficient documentation

## 2010-10-27 DIAGNOSIS — Z79899 Other long term (current) drug therapy: Secondary | ICD-10-CM | POA: Insufficient documentation

## 2010-10-27 DIAGNOSIS — R071 Chest pain on breathing: Secondary | ICD-10-CM | POA: Insufficient documentation

## 2010-10-27 DIAGNOSIS — K219 Gastro-esophageal reflux disease without esophagitis: Secondary | ICD-10-CM | POA: Insufficient documentation

## 2010-10-27 DIAGNOSIS — E7800 Pure hypercholesterolemia, unspecified: Secondary | ICD-10-CM | POA: Insufficient documentation

## 2010-10-27 DIAGNOSIS — M810 Age-related osteoporosis without current pathological fracture: Secondary | ICD-10-CM | POA: Insufficient documentation

## 2010-10-27 LAB — DIFFERENTIAL
Basophils Relative: 0 % (ref 0–1)
Eosinophils Absolute: 0.1 10*3/uL (ref 0.0–0.7)
Lymphs Abs: 1.8 10*3/uL (ref 0.7–4.0)
Neutrophils Relative %: 60 % (ref 43–77)

## 2010-10-27 LAB — POCT I-STAT, CHEM 8
Calcium, Ion: 1.18 mmol/L (ref 1.12–1.32)
Glucose, Bld: 96 mg/dL (ref 70–99)
HCT: 41 % (ref 36.0–46.0)
Hemoglobin: 13.9 g/dL (ref 12.0–15.0)
Potassium: 4.3 mEq/L (ref 3.5–5.1)

## 2010-10-27 LAB — CBC
MCV: 96.7 fL (ref 78.0–100.0)
Platelets: 216 10*3/uL (ref 150–400)
RBC: 3.93 MIL/uL (ref 3.87–5.11)
WBC: 6.4 10*3/uL (ref 4.0–10.5)

## 2010-10-27 LAB — POCT I-STAT TROPONIN I: Troponin i, poc: 0 ng/mL (ref 0.00–0.08)

## 2010-10-27 LAB — D-DIMER, QUANTITATIVE: D-Dimer, Quant: 0.37 ug/mL-FEU (ref 0.00–0.48)

## 2010-10-29 ENCOUNTER — Other Ambulatory Visit: Payer: Self-pay | Admitting: Internal Medicine

## 2010-10-29 DIAGNOSIS — Z0000 Encounter for general adult medical examination without abnormal findings: Secondary | ICD-10-CM

## 2010-10-30 ENCOUNTER — Other Ambulatory Visit: Payer: Medicare Other

## 2010-10-30 ENCOUNTER — Other Ambulatory Visit (INDEPENDENT_AMBULATORY_CARE_PROVIDER_SITE_OTHER): Payer: Medicare Other

## 2010-10-30 DIAGNOSIS — Z0000 Encounter for general adult medical examination without abnormal findings: Secondary | ICD-10-CM

## 2010-10-30 DIAGNOSIS — E785 Hyperlipidemia, unspecified: Secondary | ICD-10-CM

## 2010-10-30 LAB — HEPATIC FUNCTION PANEL
Alkaline Phosphatase: 56 U/L (ref 39–117)
Bilirubin, Direct: 0.1 mg/dL (ref 0.0–0.3)
Total Bilirubin: 0.5 mg/dL (ref 0.3–1.2)

## 2010-10-30 LAB — CBC WITH DIFFERENTIAL/PLATELET
Basophils Relative: 0.4 % (ref 0.0–3.0)
HCT: 35.7 % — ABNORMAL LOW (ref 36.0–46.0)
Hemoglobin: 11.8 g/dL — ABNORMAL LOW (ref 12.0–15.0)
MCHC: 33 g/dL (ref 30.0–36.0)
Monocytes Absolute: 0.5 10*3/uL (ref 0.1–1.0)
Monocytes Relative: 10.7 % (ref 3.0–12.0)
RDW: 14 % (ref 11.5–14.6)

## 2010-10-30 LAB — BASIC METABOLIC PANEL
Calcium: 9.3 mg/dL (ref 8.4–10.5)
GFR: 40.07 mL/min — ABNORMAL LOW (ref >60.00)
Sodium: 137 mEq/L (ref 135–145)

## 2010-10-30 LAB — URINALYSIS, ROUTINE W REFLEX MICROSCOPIC
Bilirubin Urine: NEGATIVE
Hgb urine dipstick: NEGATIVE
Nitrite: NEGATIVE
pH: 5.5 (ref 5.0–8.0)

## 2010-10-30 LAB — TSH: TSH: 2.3 u[IU]/mL (ref 0.35–5.50)

## 2010-11-05 ENCOUNTER — Other Ambulatory Visit (HOSPITAL_COMMUNITY): Payer: Medicare Other

## 2010-11-06 ENCOUNTER — Telehealth: Payer: Self-pay | Admitting: Pulmonary Disease

## 2010-11-06 ENCOUNTER — Encounter: Payer: Self-pay | Admitting: Internal Medicine

## 2010-11-06 ENCOUNTER — Ambulatory Visit (INDEPENDENT_AMBULATORY_CARE_PROVIDER_SITE_OTHER): Payer: Medicare Other | Admitting: Internal Medicine

## 2010-11-06 VITALS — BP 130/60 | HR 65 | Temp 98.1°F | Ht 62.0 in | Wt 164.2 lb

## 2010-11-06 DIAGNOSIS — Z0000 Encounter for general adult medical examination without abnormal findings: Secondary | ICD-10-CM

## 2010-11-06 DIAGNOSIS — E785 Hyperlipidemia, unspecified: Secondary | ICD-10-CM

## 2010-11-06 DIAGNOSIS — N259 Disorder resulting from impaired renal tubular function, unspecified: Secondary | ICD-10-CM

## 2010-11-06 DIAGNOSIS — D638 Anemia in other chronic diseases classified elsewhere: Secondary | ICD-10-CM

## 2010-11-06 DIAGNOSIS — Z23 Encounter for immunization: Secondary | ICD-10-CM

## 2010-11-06 NOTE — Assessment & Plan Note (Signed)

## 2010-11-06 NOTE — Telephone Encounter (Signed)
OFFICE NOTE AND THIS PHONE NOTE PRINTED AND FAXED TO DR POOLE PER DR ALVA'S REQUEST

## 2010-11-06 NOTE — Progress Notes (Signed)
Subjective:    Patient ID: [name], female    DOB: 1941-01-28, 70 y.o.   MRN: [id number]  HPI  Here for wellness and f/u with her daughter;  Overall doing ok;  Pt denies CP, worsening SOB, DOE, wheezing, orthopnea, PND, worsening LE edema, palpitations, dizziness or syncope.  Pt denies neurological change such as new Headache, facial or extremity weakness.  Pt denies polydipsia, polyuria, or low sugar symptoms. Pt states overall good compliance with treatment and medications, good tolerability, and trying to follow lower cholesterol diet.  Pt denies worsening depressive symptoms, suicidal ideation or panic, though quite anxious today regarding her overall health and labs. No fever, wt loss, night sweats, loss of appetite, or other constitutional symptoms.  Pt states good ability with ADL's, low fall risk, home safety reviewed and adequate, no significant changes in hearing or vision, and occasionally active with exercise.  Due for sept 28 c-spine surgury per dr Dutch Quint. Due for flu shot today Past Medical History  Diagnosis Date  . Cancer     lung ca  . 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 HYPERLIPIDEMIA 11/18/2006  . HYPERKALEMIA 02/05/2008  . ANEMIA-IRON DEFICIENCY 09/22/2008  . Anemia of other chronic disease 02/05/2008  . ANXIETY 06/03/2008  . DEPRESSION 04/28/2009  . HYPERTENSION 11/18/2006  . PERICARDITIS 01/03/2007  . AORTIC STENOSIS/ INSUFFICIENCY, NON-RHEUMATIC 12/03/2008  . SINUSITIS- ACUTE-NOS 11/20/2007  . SINUSITIS, CHRONIC 02/05/2008  . ALLERGIC RHINITIS 11/18/2006  . PNEUMONIA 05/02/2007  . C O P D 06/27/2008  . Stricture and stenosis of esophagus 11/14/2008  . GERD 01/03/2007  . BARRETTS ESOPHAGUS 11/14/2008  . RENAL INSUFFICIENCY 02/05/2008  . DYSPEPSIA 03/23/2007  . PRURITUS 10/08/2008  . OSTEOARTHRITIS 11/18/2006  . OSTEOARTHRITIS, KNEE, LEFT 01/03/2007  . Cervicalgia 01/13/2009  . SPONDYLOSIS, CERVICAL, WITH RADICULOPATHY 01/13/2009  . BACK PAIN 02/04/2009  .  BURSITIS, LEFT HIP 04/18/2009  . OSTEOPOROSIS 11/18/2006  . INSOMNIA-SLEEP DISORDER-UNSPEC 06/03/2008  . FATIGUE 04/18/2009  . PERIPHERAL EDEMA 02/13/2009  . MURMUR 11/18/2006  . DYSPHAGIA UNSPECIFIED 03/23/2007  . Abdominal pain, generalized 03/01/2007  . Nonspecific (abnormal) findings on radiological and other examination of body structure 06/03/2008  . Tuberculin Test Reaction 11/18/2006  . Acute bronchitis 02/23/2010  . CHOLELITHIASIS 04/24/2010  . NEPHROLITHIASIS, HX OF 04/24/2010  . Polyarthralgia 06/09/2010  . Elevated sed rate 06/11/2010  . Positive ANA (antinuclear antibody) 06/11/2010  . Rheumatoid factor positive 06/11/2010  . Cervical radiculopathy 09/28/2010  . Lumbar radiculopathy 09/28/2010   Past Surgical History  Procedure Date  . Abdominal hysterectomy   . Tubal ligation   . Tumor removal 08/06/08    from lungs    reports that she quit smoking about 4 years ago. Her smoking use included Cigarettes. She has a 60 pack-year smoking history. She has quit using smokeless tobacco. She reports that she does not drink alcohol or use illicit drugs. family history includes Coronary artery disease in her other; Hyperlipidemia in her other; and Hypertension in her other. Allergies  Allergen Reactions  . Amoxicillin     REACTION: unknown reaction  . Codeine     REACTION: rash/hives  . Fentanyl     REACTION: hallucinations  . Hydrocodone     REACTION: ineffective  . Penicillins     REACTION: rash/hives   Current Outpatient Prescriptions on File Prior to Visit  Medication Sig Dispense Refill  . albuterol (PROVENTIL HFA) 108 (90 BASE) MCG/ACT inhaler Inhale into the lungs 4 (four) times daily as needed.        Marland Kitchen  alendronate (FOSAMAX) 70 MG tablet Take 70 mg by mouth every 7 (seven) days. Take with a full glass of water on an empty stomach.       . ALPRAZolam (XANAX) 0.5 MG tablet Take 0.5 mg by mouth 2 (two) times daily as needed.        . Cholecalciferol (VITAMIN D3) 1000 UNITS CAPS Take by  mouth daily.        . furosemide (LASIX) 40 MG tablet Take 1 tablet (40 mg total) by mouth daily.  90 tablet  3  . iron polysaccharides (NIFEREX) 150 MG capsule Take 1 capsule (150 mg total) by mouth 2 (two) times daily.  60 capsule  11  . omeprazole (PRILOSEC) 20 MG capsule Take 20 mg by mouth daily.        Marland Kitchen oxyCODONE-acetaminophen (PERCOCET) 7.5-325 MG per tablet Take 1 tablet by mouth every 6 (six) hours as needed for pain.  60 tablet  0  . pravastatin (PRAVACHOL) 40 MG tablet Take 1 tablet (40 mg total) by mouth daily.  90 tablet  3  . zolpidem (AMBIEN) 5 MG tablet Take 1 tablet (5 mg total) by mouth at bedtime as needed for sleep.  30 tablet  5   Review of Systems Review of Systems  Constitutional: Negative for diaphoresis, activity change, appetite change and unexpected weight change.  HENT: Negative for hearing loss, ear pain, facial swelling, mouth sores and neck stiffness.   Eyes: Negative for pain, redness and visual disturbance.  Respiratory: Negative for shortness of breath and wheezing.   Cardiovascular: Negative for chest pain and palpitations.  Gastrointestinal: Negative for diarrhea, blood in stool, abdominal distention and rectal pain.  Genitourinary: Negative for hematuria, flank pain and decreased urine volume.  Musculoskeletal: Negative for myalgias and joint swelling.  Skin: Negative for color change and wound.  Neurological: Negative for syncope and numbness.  Hematological: Negative for adenopathy.  Psychiatric/Behavioral: Negative for hallucinations, self-injury, decreased concentration and agitation.      Objective:   Physical Exam BP 130/60  Pulse 65  Temp(Src) 98.1 F (36.7 C) (Oral)  Ht 5\' 2"  (1.575 m)  Wt 164 lb 4 oz (74.503 kg)  BMI 30.04 kg/m2  SpO2 95% Physical Exam  VS noted. Mod obese Constitutional: Pt is oriented to person, place, and time. Appears well-developed and well-nourished.  HENT:  Head: Normocephalic and atraumatic.  Right Ear:  External ear normal.  Left Ear: External ear normal.  Nose: Nose normal.  Mouth/Throat: Oropharynx is clear and moist.  Eyes: Conjunctivae and EOM are normal. Pupils are equal, round, and reactive to light.  Neck: Normal range of motion. Neck supple. No JVD present. No tracheal deviation present.  Cardiovascular: Normal rate, regular rhythm, normal heart sounds and intact distal pulses.   Pulmonary/Chest: Effort normal and breath sounds normal.  Abdominal: Soft. Bowel sounds are normal. There is no tenderness.  Musculoskeletal: Normal range of motion. Exhibits no edema.  Lymphadenopathy:  Has no cervical adenopathy.  Neurological: Pt is alert and oriented to person, place, and time. Pt has normal reflexes. No cranial nerve deficit. O/w not done in detail today - for c-spine surgury soon Skin: Skin is warm and dry. No rash noted.  Psychiatric:  Has  normal mood and affect. Behavior is normal.        Assessment & Plan:

## 2010-11-06 NOTE — Telephone Encounter (Signed)
Request received from Dr Dutch Quint neurosurgery for pre-op clearance for C3-5 anterior fusion NO pulmonary contra-indication to surgery Use albuterol nebs q 4h prn wheezing post op Pl fax my last note with this note to [phone]

## 2010-11-06 NOTE — Patient Instructions (Addendum)
Continue all other medications as before Good Luck with your surgury in a few days Please keep your appointments with your specialists as you have planned - cardiology Please return in 6 mo with Lab testing done 3-5 days before

## 2010-11-07 ENCOUNTER — Encounter: Payer: Self-pay | Admitting: Internal Medicine

## 2010-11-07 NOTE — Assessment & Plan Note (Signed)
stable overall by hx and exam, most recent data reviewed with pt, and pt to continue medical treatment as before  Lab Results  Component Value Date   HGB 11.8* 10/30/2010

## 2010-11-09 ENCOUNTER — Encounter (HOSPITAL_COMMUNITY)
Admission: RE | Admit: 2010-11-09 | Discharge: 2010-11-09 | Disposition: A | Discharge status: Home / Self Care | Payer: Medicare Other | Source: Ambulatory Visit | Attending: Neurosurgery | Admitting: Neurosurgery

## 2010-11-09 DIAGNOSIS — Z01812 Encounter for preprocedural laboratory examination: Secondary | ICD-10-CM | POA: Insufficient documentation

## 2010-11-09 DIAGNOSIS — Z01818 Encounter for other preprocedural examination: Secondary | ICD-10-CM | POA: Insufficient documentation

## 2010-11-12 ENCOUNTER — Telehealth: Payer: Self-pay | Admitting: *Deleted

## 2010-11-12 NOTE — Telephone Encounter (Signed)
Received fax  from [facility] requesting surgical clearance for pt to undergo C/3-5 Anterior Cervical Fusion on 11/13/10. Pt recently seen in ED on Sept. 11, 2012 and primary diagnosis was chest wall pain.  Will forward to Dr. Clifton James to review.

## 2010-11-12 NOTE — Telephone Encounter (Signed)
Reviewed with Dr. Clifton James and pt will need office visit prior to surgery. Erie Noe at [facility] called and given this information. I also called pt to schedule appoinment. She can be added to schedule on November 16, 2010. Left message for pt to call back.

## 2010-11-12 NOTE — Telephone Encounter (Signed)
Spoke with pt and she is agreeable with plan to see Dr. Clifton James prior to surgery. Appt made for November 16, 2010 at 10:00. Erie Noe at [facility] also made aware.

## 2010-11-13 ENCOUNTER — Inpatient Hospital Stay (HOSPITAL_COMMUNITY): Admission: RE | Admit: 2010-11-13 | Payer: Medicare Other | Source: Ambulatory Visit | Admitting: Neurosurgery

## 2010-11-16 ENCOUNTER — Ambulatory Visit (INDEPENDENT_AMBULATORY_CARE_PROVIDER_SITE_OTHER): Payer: Medicare Other | Admitting: Cardiovascular Disease

## 2010-11-16 ENCOUNTER — Encounter: Payer: Self-pay | Admitting: Cardiovascular Disease

## 2010-11-16 VITALS — BP 144/70 | HR 72 | Ht 62.0 in | Wt 163.8 lb

## 2010-11-16 DIAGNOSIS — I359 Nonrheumatic aortic valve disorder, unspecified: Secondary | ICD-10-CM

## 2010-11-16 DIAGNOSIS — Z01810 Encounter for preprocedural cardiovascular examination: Secondary | ICD-10-CM | POA: Insufficient documentation

## 2010-11-16 NOTE — Progress Notes (Signed)
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 She was referred in May of 2010  for cardiology risk assessment prior to planned thoracotomy which she had in June 2010. Her echo prior to the surgery showed normal LV function with mild diastolic dysfunction and mild AI.  She has done well since then. I last saw her a year ago   She has had no chest pain with exertion, no shortness of breath with exertion, palpitations, near syncope, syncope or lower ext edema. She notes arthritis pains. She was seen in the ED at [facility] October 28, 2010 for left flank pain. It was felt to be musculoskeletal. Cardiac enzymes were normal. She has an upcoming cervical surgery with Dr. Dutch Quint. (C3-5 anterior cervical  Fusion). She is able to perform all of her activities of daily living without problems.   Past Medical History  Diagnosis Date  . Cancer     lung ca  . 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 HYPERLIPIDEMIA 11/18/2006  . HYPERKALEMIA 02/05/2008  . ANEMIA-IRON DEFICIENCY 09/22/2008  . Anemia of other chronic disease 02/05/2008  . ANXIETY 06/03/2008  . DEPRESSION 04/28/2009  . HYPERTENSION 11/18/2006  . PERICARDITIS 01/03/2007  . AORTIC STENOSIS/ INSUFFICIENCY, NON-RHEUMATIC 12/03/2008  . SINUSITIS- ACUTE-NOS 11/20/2007  . SINUSITIS, CHRONIC 02/05/2008  . ALLERGIC RHINITIS 11/18/2006  . PNEUMONIA 05/02/2007  . C O P D 06/27/2008  . Stricture and stenosis of esophagus 11/14/2008  . GERD 01/03/2007  . BARRETTS ESOPHAGUS 11/14/2008  . RENAL INSUFFICIENCY 02/05/2008  . DYSPEPSIA 03/23/2007  . PRURITUS 10/08/2008  . OSTEOARTHRITIS 11/18/2006  . OSTEOARTHRITIS, KNEE, LEFT 01/03/2007  . Cervicalgia 01/13/2009  . SPONDYLOSIS, CERVICAL, WITH RADICULOPATHY 01/13/2009  . BACK PAIN 02/04/2009  . BURSITIS, LEFT HIP 04/18/2009  .  OSTEOPOROSIS 11/18/2006  . INSOMNIA-SLEEP DISORDER-UNSPEC 06/03/2008  . FATIGUE 04/18/2009  . PERIPHERAL EDEMA 02/13/2009  . MURMUR 11/18/2006  . DYSPHAGIA UNSPECIFIED 03/23/2007  . Abdominal pain, generalized 03/01/2007  . Nonspecific (abnormal) findings on radiological and other examination of body structure 06/03/2008  . Tuberculin Test Reaction 11/18/2006  . Acute bronchitis 02/23/2010  . CHOLELITHIASIS 04/24/2010  . NEPHROLITHIASIS, HX OF 04/24/2010  . Polyarthralgia 06/09/2010  . Elevated sed rate 06/11/2010  . Positive ANA (antinuclear antibody) 06/11/2010  . Rheumatoid factor positive 06/11/2010  . Cervical radiculopathy 09/28/2010  . Lumbar radiculopathy 09/28/2010    Past Surgical History  Procedure Date  . Abdominal hysterectomy   . Tubal ligation   . Tumor removal 08/06/08    from lungs    Current Outpatient Prescriptions  Medication Sig Dispense Refill  . albuterol (PROVENTIL HFA) 108 (90 BASE) MCG/ACT inhaler Inhale into the lungs 4 (four) times daily as needed.        Marland Kitchen alendronate (FOSAMAX) 70 MG tablet Take 70 mg by mouth every 7 (seven) days. Take with a full glass of water on an empty stomach.       . ALPRAZolam (XANAX) 0.5 MG tablet Take 0.5 mg by mouth 2 (two) times daily as needed.        . Cholecalciferol (VITAMIN D3) 1000 UNITS CAPS Take by mouth daily.        . furosemide (LASIX) 40 MG tablet Take 1 tablet (40 mg total) by mouth daily.  90 tablet  3  . iron polysaccharides (NIFEREX) 150 MG capsule Take 1  capsule (150 mg total) by mouth 2 (two) times daily.  60 capsule  11  . omeprazole (PRILOSEC) 20 MG capsule Take 20 mg by mouth daily.        Marland Kitchen oxyCODONE-acetaminophen (PERCOCET) 7.5-325 MG per tablet Take 1 tablet by mouth every 6 (six) hours as needed for pain.  60 tablet  0  . pravastatin (PRAVACHOL) 40 MG tablet Take 1 tablet (40 mg total) by mouth daily.  90 tablet  3  . zolpidem (AMBIEN) 5 MG tablet Take 1 tablet (5 mg total) by mouth at bedtime as needed for sleep.  30  tablet  5    Allergies  Allergen Reactions  . Amoxicillin     REACTION: unknown reaction  . Codeine     REACTION: rash/hives  . Fentanyl     REACTION: hallucinations  . Hydrocodone     REACTION: ineffective  . Penicillins     REACTION: rash/hives    History   Social History  . Marital Status: Widowed    Spouse Name: N/A    Number of Children: N/A  . Years of Education: N/A   Occupational History  . Not on file.   Social History Main Topics  . Smoking status: Former Smoker -- 1.5 packs/day for 40 years    Types: Cigarettes    Quit date: 02/15/2006  . Smokeless tobacco: Former Neurosurgeon  . Alcohol Use: No  . Drug Use: No  . Sexually Active: Not on file   Other Topics Concern  . Not on file   Social History Narrative  . No narrative on file    Family History  Problem Relation Age of Onset  . Hyperlipidemia Other   . Hypertension Other   . Coronary artery disease Other     several nephrew    Review of Systems:  As stated in the HPI and otherwise negative.   BP 144/70  Pulse 72  Ht 5\' 2"  (1.575 m)  Wt 163 lb 12.8 oz (74.299 kg)  BMI 29.96 kg/m2  Physical Examination: General: Well developed, well nourished, NAD HEENT: OP clear, mucus membranes moist SKIN: warm, dry. No rashes. Neuro: No focal deficits Musculoskeletal: Muscle strength 5/5 all ext Psychiatric: Mood and affect normal Neck: No JVD, no carotid bruits, no thyromegaly, no lymphadenopathy. Lungs:Clear bilaterally, no wheezes, rhonci, crackles Cardiovascular: Regular rate and rhythm. Systolic  Murmur.  No gallops or rubs. Abdomen:Soft. Bowel sounds present. Non-tender.  Extremities: No lower extremity edema. Pulses are 2 + in the bilateral DP/PT.  ZOX:WRUEA rhythm, rate 76 bpm. PACs.

## 2010-11-16 NOTE — Patient Instructions (Signed)
Your physician wants you to follow-up in: 12 months.  You will receive a reminder letter in the mail two months in advance. If you don't receive a letter, please call our office to schedule the follow-up appointment.  Your physician has requested that you have an echocardiogram. Echocardiography is a painless test that uses sound waves to create images of your heart. It provides your doctor with information about the size and shape of your heart and how well your heart's chambers and valves are working. This procedure takes approximately one hour. There are no restrictions for this procedure. To be done this week.

## 2010-11-16 NOTE — Assessment & Plan Note (Signed)
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 Will repeat echo now.

## 2010-11-16 NOTE — Assessment & Plan Note (Addendum)
No ischemic workup needed before surgery but  I would like to repeat her echo this week to assess her aortic valve insufficiency. She can proceed to surgery as planned.

## 2010-11-17 ENCOUNTER — Telehealth: Payer: Self-pay | Admitting: Pulmonary Disease

## 2010-11-17 NOTE — Telephone Encounter (Signed)
Have given clearance to proceed. She can see me when she is due for her appt

## 2010-11-17 NOTE — Telephone Encounter (Signed)
I spoke with pt and she states she is going to have surgery on her neck. She states her vertebrae is deteriorating. She states it is not scheduled yet but Dr. Dutch Quint is going to do the surgery. She states it will be scheduled after her echo on Friday. Pt wanted to see if Dr. Vassie Loll needs to see her as well. Pt states her breathing has been fine and is not having any problems. Please advise Dr. Vassie Loll. Thanks  Carver Fila, CMA

## 2010-11-18 NOTE — Telephone Encounter (Signed)
Spoke with pt and notified of recs per RA She verbalized understanding and states nothing further needed 

## 2010-11-20 ENCOUNTER — Other Ambulatory Visit (HOSPITAL_COMMUNITY): Payer: Medicare Other

## 2010-11-24 ENCOUNTER — Ambulatory Visit: Payer: Medicare Other | Admitting: Cardiovascular Disease

## 2010-11-25 ENCOUNTER — Ambulatory Visit: Payer: Medicare Other | Admitting: Cardiovascular Disease

## 2010-11-26 ENCOUNTER — Ambulatory Visit (HOSPITAL_COMMUNITY): Payer: Medicare Other | Attending: Cardiovascular Disease

## 2010-11-26 DIAGNOSIS — Z01810 Encounter for preprocedural cardiovascular examination: Secondary | ICD-10-CM

## 2010-11-26 DIAGNOSIS — I10 Essential (primary) hypertension: Secondary | ICD-10-CM | POA: Insufficient documentation

## 2010-11-26 DIAGNOSIS — R609 Edema, unspecified: Secondary | ICD-10-CM | POA: Insufficient documentation

## 2010-11-26 DIAGNOSIS — Z85118 Personal history of other malignant neoplasm of bronchus and lung: Secondary | ICD-10-CM | POA: Insufficient documentation

## 2010-11-26 DIAGNOSIS — I359 Nonrheumatic aortic valve disorder, unspecified: Secondary | ICD-10-CM | POA: Insufficient documentation

## 2010-11-26 DIAGNOSIS — Z87891 Personal history of nicotine dependence: Secondary | ICD-10-CM | POA: Insufficient documentation

## 2010-11-26 DIAGNOSIS — J4489 Other specified chronic obstructive pulmonary disease: Secondary | ICD-10-CM | POA: Insufficient documentation

## 2010-11-26 DIAGNOSIS — J449 Chronic obstructive pulmonary disease, unspecified: Secondary | ICD-10-CM | POA: Insufficient documentation

## 2010-11-26 DIAGNOSIS — E785 Hyperlipidemia, unspecified: Secondary | ICD-10-CM | POA: Insufficient documentation

## 2010-11-27 ENCOUNTER — Other Ambulatory Visit: Payer: Self-pay

## 2010-11-27 MED ORDER — OXYCODONE-ACETAMINOPHEN 7.5-325 MG PO TABS: 1.0000 | ORAL_TABLET | Freq: Four times a day (QID) | ORAL | Status: DC | PRN

## 2010-11-27 NOTE — Telephone Encounter (Signed)
Pt informed, Rx in cabinet for pt pick up  

## 2010-12-01 ENCOUNTER — Telehealth: Payer: Self-pay | Admitting: Cardiology

## 2010-12-01 NOTE — Telephone Encounter (Signed)
Pt was called.

## 2010-12-01 NOTE — Telephone Encounter (Signed)
Pt returning call from our office. Please call back.  

## 2010-12-01 NOTE — Telephone Encounter (Signed)
Pt returned a call to [facility].  Pt is at home, please call her back

## 2010-12-01 NOTE — Telephone Encounter (Signed)
Spoke with pt and gave her echo results. I also told her I would fax office note from last visit with Dr. Clifton James to Dr. Jordan Likes.  Note faxed to [phone].

## 2010-12-18 ENCOUNTER — Other Ambulatory Visit: Payer: Self-pay

## 2010-12-18 MED ORDER — OXYCODONE-ACETAMINOPHEN 7.5-325 MG PO TABS: 1.0000 | ORAL_TABLET | Freq: Four times a day (QID) | ORAL | Status: DC | PRN

## 2010-12-18 NOTE — Telephone Encounter (Signed)
Pt informed, Rx in cabinet for pt pick up  

## 2010-12-24 ENCOUNTER — Encounter: Payer: Self-pay | Admitting: Internal Medicine

## 2010-12-24 ENCOUNTER — Ambulatory Visit (INDEPENDENT_AMBULATORY_CARE_PROVIDER_SITE_OTHER): Payer: Medicare Other | Admitting: Internal Medicine

## 2010-12-24 VITALS — BP 122/70 | HR 72 | Temp 98.6°F | Ht 62.0 in | Wt 165.4 lb

## 2010-12-24 DIAGNOSIS — M25562 Pain in left knee: Secondary | ICD-10-CM

## 2010-12-24 DIAGNOSIS — M79671 Pain in right foot: Secondary | ICD-10-CM | POA: Insufficient documentation

## 2010-12-24 DIAGNOSIS — M25569 Pain in unspecified knee: Secondary | ICD-10-CM

## 2010-12-24 DIAGNOSIS — D509 Iron deficiency anemia, unspecified: Secondary | ICD-10-CM

## 2010-12-24 DIAGNOSIS — M79609 Pain in unspecified limb: Secondary | ICD-10-CM

## 2010-12-24 DIAGNOSIS — M79672 Pain in left foot: Secondary | ICD-10-CM

## 2010-12-24 NOTE — Assessment & Plan Note (Addendum)
Recent HGB normal, ok to stop the iron,  to f/u any worsening symptoms or concerns, re-check with next visit  Lab Results  Component Value Date   HGB 11.8* 10/30/2010

## 2010-12-24 NOTE — Assessment & Plan Note (Signed)
Has symtp left bunion and midfooot pain, as well as left knee pain/effusion and left leg/foot swelling - ok to refer to Dr Hewitt/[place] [facility], Continue all other medications as before

## 2010-12-24 NOTE — Patient Instructions (Addendum)
You will be contacted regarding the referral for: orthopedic Your HGB was back to normal on Sept 11, 2012 - ok to stop the iron Continue all other medications as before [name] with your surgury Nov 30

## 2010-12-24 NOTE — Assessment & Plan Note (Signed)
Pt also states intermitten right foot discomfort diffuse/nonspecific, will add to dx for ortho eval per pt request

## 2010-12-24 NOTE — Progress Notes (Signed)
Subjective:    Patient ID: [name], female    DOB: [birth year], 70 y.o.   MRN: [id number]  HPI   Here with c/o 3 wks relatively sudden onset and now persistent pain, dull to left knee, with effusion but no giveaway, with also worsening swelling to the left leg and foot;  Also has other pain to the bunion area of the left foot as well as the midfoot, despite worse with ambulation and trying to favor the leg;  Pt denies chest pain, increased sob or doe, wheezing, orthopnea, PND, increased LE swelling, palpitations, dizziness or syncope. Pt denies new neurological symptoms such as new headache, or facial or extremity weakness or numbness   Pt denies polydipsia, polyuria,.  No recent falls.  Has hx of remote gout and wonders if that is going on today but no swelling except for that above.  No overt bleeding or bruising. Wants to be able to stop the ironif possible, and on lab review her Hgb was normal sept 2012.  Overall good compliance with treatment, and good medicine tolerability.  No other new complaints Past Medical History  Diagnosis Date  . Cancer     lung ca  . 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 HYPERLIPIDEMIA 11/18/2006  . HYPERKALEMIA 02/05/2008  . ANEMIA-IRON DEFICIENCY 09/22/2008  . Anemia of other chronic disease 02/05/2008  . ANXIETY 06/03/2008  . DEPRESSION 04/28/2009  . HYPERTENSION 11/18/2006  . PERICARDITIS 01/03/2007  . AORTIC STENOSIS/ INSUFFICIENCY, NON-RHEUMATIC 12/03/2008  . SINUSITIS- ACUTE-NOS 11/20/2007  . SINUSITIS, CHRONIC 02/05/2008  . ALLERGIC RHINITIS 11/18/2006  . PNEUMONIA 05/02/2007  . C O P D 06/27/2008  . Stricture and stenosis of esophagus 11/14/2008  . GERD 01/03/2007  . BARRETTS ESOPHAGUS 11/14/2008  . RENAL INSUFFICIENCY 02/05/2008  . DYSPEPSIA 03/23/2007  . PRURITUS 10/08/2008  . OSTEOARTHRITIS 11/18/2006  . OSTEOARTHRITIS, KNEE, LEFT 01/03/2007  . Cervicalgia 01/13/2009  . SPONDYLOSIS, CERVICAL, WITH RADICULOPATHY 01/13/2009  . BACK PAIN 02/04/2009   . BURSITIS, LEFT HIP 04/18/2009  . OSTEOPOROSIS 11/18/2006  . INSOMNIA-SLEEP DISORDER-UNSPEC 06/03/2008  . FATIGUE 04/18/2009  . PERIPHERAL EDEMA 02/13/2009  . MURMUR 11/18/2006  . DYSPHAGIA UNSPECIFIED 03/23/2007  . Abdominal pain, generalized 03/01/2007  . Nonspecific (abnormal) findings on radiological and other examination of body structure 06/03/2008  . Tuberculin Test Reaction 11/18/2006  . Acute bronchitis 02/23/2010  . CHOLELITHIASIS 04/24/2010  . NEPHROLITHIASIS, HX OF 04/24/2010  . Polyarthralgia 06/09/2010  . Elevated sed rate 06/11/2010  . Positive ANA (antinuclear antibody) 06/11/2010  . Rheumatoid factor positive 06/11/2010  . Cervical radiculopathy 09/28/2010  . Lumbar radiculopathy 09/28/2010   Past Surgical History  Procedure Date  . Abdominal hysterectomy   . Tubal ligation   . Tumor removal 08/06/08    from lungs    reports that she quit smoking about 4 years ago. Her smoking use included Cigarettes. She has a 60 pack-year smoking history. She has quit using smokeless tobacco. She reports that she does not drink alcohol or use illicit drugs. family history includes Coronary artery disease in her other; Hyperlipidemia in her other; and Hypertension in her other. Allergies  Allergen Reactions  . Amoxicillin     REACTION: unknown reaction  . Codeine     REACTION: rash/hives  . Fentanyl     REACTION: hallucinations  . Hydrocodone     REACTION: ineffective  . Penicillins     REACTION: rash/hives   Current Outpatient Prescriptions on File Prior to Visit  Medication Sig Dispense Refill  .  albuterol (PROVENTIL HFA) 108 (90 BASE) MCG/ACT inhaler Inhale into the lungs 4 (four) times daily as needed.        Marland Kitchen alendronate (FOSAMAX) 70 MG tablet Take 70 mg by mouth every 7 (seven) days. Take with a full glass of water on an empty stomach.       . ALPRAZolam (XANAX) 0.5 MG tablet Take 0.5 mg by mouth 2 (two) times daily as needed.        . Cholecalciferol (VITAMIN D3) 1000 UNITS CAPS  Take by mouth daily.        . furosemide (LASIX) 40 MG tablet Take 1 tablet (40 mg total) by mouth daily.  90 tablet  3  . omeprazole (PRILOSEC) 20 MG capsule Take 20 mg by mouth daily.        Marland Kitchen oxyCODONE-acetaminophen (PERCOCET) 7.5-325 MG per tablet Take 1 tablet by mouth every 6 (six) hours as needed for pain.  60 tablet  0  . pravastatin (PRAVACHOL) 40 MG tablet Take 1 tablet (40 mg total) by mouth daily.  90 tablet  3  . zolpidem (AMBIEN) 5 MG tablet Take 1 tablet (5 mg total) by mouth at bedtime as needed for sleep.  30 tablet  5   Review of Systems Review of Systems  Constitutional: Negative for diaphoresis and unexpected weight change.  HENT: Negative for drooling and tinnitus.   Eyes: Negative for photophobia and visual disturbance.  Respiratory: Negative for choking and stridor.   Gastrointestinal: Negative for vomiting and blood in stool.  Genitourinary: Negative for hematuria and decreased urine volume.   Objective:   Physical Exam BP 122/70  Pulse 72  Temp(Src) 98.6 F (37 C) (Oral)  Ht 5\' 2"  (1.575 m)  Wt 165 lb 6 oz (75.014 kg)  BMI 30.25 kg/m2  SpO2 97% Physical Exam  VS noted Constitutional: Pt appears well-developed and well-nourished.  HENT: Head: Normocephalic.  Right Ear: External ear normal.  Left Ear: External ear normal.  Eyes: Conjunctivae and EOM are normal. Pupils are equal, round, and reactive to light.  Neck: Normal range of motion. Neck supple.  Cardiovascular: Normal rate and regular rhythm.   Pulmonary/Chest: Effort normal and breath sounds normal.  Abd:  Soft, NT, non-distended, + BS Neurological: Pt is alert. No cranial nerve deficit.  Skin: Skin is warm. No erythema. some warmth noted to mid left med leg but no ulcer, erythema, drainage; overall LLE trace to 1+ edema below the knee Psychiatric: Pt behavior is normal. Thought content normal. 1+ nervous Left foot neurovasc intact, mild tender left bunion noted without significant other soft  tissue swelling Left Knee with trace -1+ effusion, some decreased ROM, NT, neg lochmans Assessment & Plan:

## 2010-12-24 NOTE — Assessment & Plan Note (Signed)
With mild effusion and pain, suspect underlying DJD, could need cortisone - for referral to ortho as above,  to f/u any worsening symptoms or concerns, Continue all other medications as before

## 2010-12-25 ENCOUNTER — Encounter: Payer: Self-pay | Admitting: Gastroenterology

## 2010-12-29 ENCOUNTER — Other Ambulatory Visit: Payer: Self-pay

## 2010-12-29 MED ORDER — OMEPRAZOLE 20 MG PO CPDR: 20.0000 mg | DELAYED_RELEASE_CAPSULE | Freq: Every day | ORAL | Status: DC

## 2010-12-29 MED ORDER — ALENDRONATE SODIUM 70 MG PO TABS: 70.0000 mg | ORAL_TABLET | ORAL | Status: DC

## 2011-01-01 ENCOUNTER — Encounter (HOSPITAL_COMMUNITY): Payer: Self-pay

## 2011-01-04 ENCOUNTER — Other Ambulatory Visit: Payer: Self-pay

## 2011-01-04 MED ORDER — OXYCODONE-ACETAMINOPHEN 7.5-325 MG PO TABS: 1.0000 | ORAL_TABLET | Freq: Four times a day (QID) | ORAL | Status: DC | PRN

## 2011-01-04 NOTE — Telephone Encounter (Signed)
Called the patient left message that prescription requested is ready for pickup at front desk. 

## 2011-01-04 NOTE — Telephone Encounter (Signed)
Done hardcopy to robin  

## 2011-01-05 ENCOUNTER — Encounter (HOSPITAL_COMMUNITY)
Admission: RE | Admit: 2011-01-05 | Discharge: 2011-01-05 | Disposition: A | Discharge status: Home / Self Care | Payer: Medicare Other | Source: Ambulatory Visit | Attending: Neurosurgery | Admitting: Neurosurgery

## 2011-01-05 ENCOUNTER — Encounter (HOSPITAL_COMMUNITY): Payer: Self-pay

## 2011-01-05 ENCOUNTER — Encounter (HOSPITAL_COMMUNITY)
Admission: RE | Admit: 2011-01-05 | Discharge: 2011-01-05 | Disposition: A | Discharge status: Home / Self Care | Payer: Medicare Other | Source: Ambulatory Visit | Attending: Anesthesiology | Admitting: Anesthesiology

## 2011-01-05 LAB — CBC
HCT: 36.4 % (ref 36.0–46.0)
Hemoglobin: 12 g/dL (ref 12.0–15.0)
MCV: 101.7 fL — ABNORMAL HIGH (ref 78.0–100.0)
RBC: 3.58 MIL/uL — ABNORMAL LOW (ref 3.87–5.11)
WBC: 4.4 10*3/uL (ref 4.0–10.5)

## 2011-01-05 LAB — BASIC METABOLIC PANEL
BUN: 16 mg/dL (ref 6–23)
CO2: 26 mEq/L (ref 19–32)
Chloride: 103 mEq/L (ref 96–112)
GFR calc Af Amer: 46 mL/min — ABNORMAL LOW (ref >90)
Glucose, Bld: 110 mg/dL — ABNORMAL HIGH (ref 70–99)
Potassium: 4.1 mEq/L (ref 3.5–5.1)

## 2011-01-05 LAB — DIFFERENTIAL
Basophils Absolute: 0 10*3/uL (ref 0.0–0.1)
Lymphocytes Relative: 24 % (ref 12–46)
Lymphs Abs: 1.1 10*3/uL (ref 0.7–4.0)
Neutro Abs: 2.8 10*3/uL (ref 1.7–7.7)

## 2011-01-05 LAB — TYPE AND SCREEN: Antibody Screen: NEGATIVE

## 2011-01-05 NOTE — Progress Notes (Signed)
Ekg, echo in epic. Clearance on chart from dr Clifton James

## 2011-01-05 NOTE — Pre-Procedure Instructions (Signed)
20 [name]  01/05/2011   Your procedure is scheduled on:  01/15/11  Report to [facility] at930 AM.  Call this number if you have problems the morning of surgery: [phone]   Remember:   Do not eat food:After Midnight.  may drink clear liquids: 4 Hours before arrival.  Take these medicines the morning of surgery with A SIP OF WATER: omeprazole, inhalers gabapentin  Do not wear jewelry, make-up or nail polish.  Do not wear lotions, powders, or perfumes. You may wear deodorant.  Do not shave 48 hours prior to surgery.  Do not bring valuables to the hospital.  Contacts, dentures or bridgework may not be worn into surgery.  Leave suitcase in the car. After surgery it may be brought to your room.  For patients admitted to the hospital, checkout time is 11:00 AM the day of discharge.   Patients discharged the day of surgery will not be allowed to drive home.  Name and phone number of your driver: [name] [phone]*  Special Instructions: CHG Shower Use Special Wash: 1/2 bottle night before surgery and 1/2 bottle morning of surgery.   Please read over the following fact sheets that you were given: Pain Booklet, Coughing and Deep Breathing, Blood Transfusion Information, MRSA Information and Surgical Site Infection Prevention

## 2011-01-11 ENCOUNTER — Ambulatory Visit (INDEPENDENT_AMBULATORY_CARE_PROVIDER_SITE_OTHER): Payer: Medicare Other | Admitting: Adult Health

## 2011-01-11 ENCOUNTER — Encounter: Payer: Self-pay | Admitting: Adult Health

## 2011-01-11 VITALS — BP 140/72 | HR 75 | Temp 97.5°F | Ht 62.0 in | Wt 168.4 lb

## 2011-01-11 DIAGNOSIS — C3490 Malignant neoplasm of unspecified part of unspecified bronchus or lung: Secondary | ICD-10-CM

## 2011-01-11 DIAGNOSIS — J449 Chronic obstructive pulmonary disease, unspecified: Secondary | ICD-10-CM

## 2011-01-11 MED ORDER — ALBUTEROL SULFATE HFA 108 (90 BASE) MCG/ACT IN AERS: 1.0000 | INHALATION_SPRAY | Freq: Four times a day (QID) | RESPIRATORY_TRACT | Status: DC | PRN

## 2011-01-11 NOTE — Assessment & Plan Note (Signed)
Cont follow up with Dr. Gwenyth Bouillon

## 2011-01-11 NOTE — Progress Notes (Signed)
Addended by: Boone Master E on: 01/11/2011 10:26 AM   Modules accepted: Orders

## 2011-01-11 NOTE — Progress Notes (Signed)
  Subjective:    Patient ID: [name], female    DOB: [birth year], 70 y.o.   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  Pre-op FEV1 was 1.53 (80%) & DLCO 48%  CT 03/24/09 no recurrence  Off Spiriva since 2/11 since very expensive.   Jun 30, 2009 1:43 PM  only needed rescue inhaler once.  Rpt PFTs >> FEv1 66%, FVC 63%, DLCO 44%   5 /24/2012 Last seen jan '12  - given z-pak, Ct jan'12 reviewed  Stopped spiriva  The patient complains of shortness of breath, but denies chest tightness, chest pain worse with breathing and coughing, wheezing, cough, mucous production, nocturnal awakening, exercise induced symptoms, and congestion >>no changes   01/11/2011 Follow up  Doing well , no change/decline in activity tolerance since stopping Spiriva. NO SABA use since last OV Has upcoming neck surgery this week with Neurosurgeon.  No cough, increased dyspnea or edema, weight loss or hemopytsis  Says she is able to do housework and light activities , currently restricted due to neck pain.  CT chest 09/2010 with stable changes.  Spirometry today >>FEV1>66%, ratio >69    Review of Systems  Constitutional:   No  weight loss, night sweats,  Fevers, chills, + fatigue, or  lassitude.  HEENT:   No headaches,  Difficulty swallowing,  Tooth/dental problems, or  Sore throat,                No sneezing, itching, ear ache, nasal congestion, post nasal drip,   CV:  No chest pain,  Orthopnea, PND, swelling in lower extremities, anasarca, dizziness, palpitations, syncope.   GI  No heartburn, indigestion, abdominal pain, nausea, vomiting, diarrhea, change in bowel habits, loss of appetite, bloody stools.   Resp:    No excess mucus, no productive cough,  No non-productive cough,  No coughing up of blood.  No change in color of mucus.  No wheezing.  No chest wall deformity  Skin: no rash or lesions.  GU: no  dysuria, change in color of urine, no urgency or frequency.  No flank pain, no hematuria   MS:  No joint   swelling.  No decreased range of motion.     Psych:  No change in mood or affect. No depression or anxiety.  No memory loss.          Objective:   Physical Exam  Gen. Pleasant, well-nourished, in no distress ENT - no lesions, no post nasal drip Neck: No JVD, no thyromegaly, no carotid bruits Lungs: no use of accessory muscles, no dullness to percussion, clear without rales or rhonchi  Cardiovascular: Rhythm regular, heart sounds  normal, no murmurs or gallops, no peripheral edema Musculoskeletal: No deformities, no cyanosis or clubbing          Assessment & Plan:

## 2011-01-11 NOTE — Patient Instructions (Signed)
Keep up good work  follow up Dr. Alva  In 6 months and As needed   

## 2011-01-11 NOTE — Assessment & Plan Note (Signed)
Mild COPD - no significant change in FEV1 off Spiriva Cont to follow up Dr. Vassie Loll  In 6 months

## 2011-01-15 ENCOUNTER — Encounter (HOSPITAL_COMMUNITY): Payer: Self-pay | Admitting: Anesthesiology

## 2011-01-15 ENCOUNTER — Inpatient Hospital Stay (HOSPITAL_COMMUNITY)
Admission: RE | Admit: 2011-01-15 | Discharge: 2011-01-16 | DRG: 473 | Disposition: A | Discharge status: Home / Self Care | Payer: Medicare Other | Source: Ambulatory Visit | Attending: Neurosurgery | Admitting: Neurosurgery

## 2011-01-15 ENCOUNTER — Encounter (HOSPITAL_COMMUNITY): Payer: Self-pay | Admitting: *Deleted

## 2011-01-15 ENCOUNTER — Encounter (HOSPITAL_COMMUNITY): Payer: Self-pay | Admitting: Neurosurgery

## 2011-01-15 ENCOUNTER — Encounter (HOSPITAL_COMMUNITY)
Admission: RE | Disposition: A | Discharge status: Home / Self Care | Payer: Self-pay | Source: Ambulatory Visit | Attending: Neurosurgery

## 2011-01-15 ENCOUNTER — Inpatient Hospital Stay (HOSPITAL_COMMUNITY): Payer: Medicare Other

## 2011-01-15 ENCOUNTER — Inpatient Hospital Stay (HOSPITAL_COMMUNITY): Payer: Medicare Other | Admitting: Anesthesiology

## 2011-01-15 DIAGNOSIS — G9520 Unspecified cord compression: Secondary | ICD-10-CM | POA: Diagnosis present

## 2011-01-15 DIAGNOSIS — Z87891 Personal history of nicotine dependence: Secondary | ICD-10-CM

## 2011-01-15 DIAGNOSIS — Z888 Allergy status to other drugs, medicaments and biological substances status: Secondary | ICD-10-CM

## 2011-01-15 DIAGNOSIS — M1710 Unilateral primary osteoarthritis, unspecified knee: Secondary | ICD-10-CM | POA: Diagnosis present

## 2011-01-15 DIAGNOSIS — I10 Essential (primary) hypertension: Secondary | ICD-10-CM | POA: Diagnosis present

## 2011-01-15 DIAGNOSIS — I359 Nonrheumatic aortic valve disorder, unspecified: Secondary | ICD-10-CM | POA: Diagnosis present

## 2011-01-15 DIAGNOSIS — M810 Age-related osteoporosis without current pathological fracture: Secondary | ICD-10-CM | POA: Diagnosis present

## 2011-01-15 DIAGNOSIS — Z8249 Family history of ischemic heart disease and other diseases of the circulatory system: Secondary | ICD-10-CM

## 2011-01-15 DIAGNOSIS — K219 Gastro-esophageal reflux disease without esophagitis: Secondary | ICD-10-CM | POA: Diagnosis present

## 2011-01-15 DIAGNOSIS — F341 Dysthymic disorder: Secondary | ICD-10-CM | POA: Diagnosis present

## 2011-01-15 DIAGNOSIS — J4489 Other specified chronic obstructive pulmonary disease: Secondary | ICD-10-CM | POA: Diagnosis present

## 2011-01-15 DIAGNOSIS — M4712 Other spondylosis with myelopathy, cervical region: Principal | ICD-10-CM | POA: Diagnosis present

## 2011-01-15 DIAGNOSIS — Z880 Allergy status to penicillin: Secondary | ICD-10-CM

## 2011-01-15 DIAGNOSIS — Z79899 Other long term (current) drug therapy: Secondary | ICD-10-CM

## 2011-01-15 DIAGNOSIS — J449 Chronic obstructive pulmonary disease, unspecified: Secondary | ICD-10-CM | POA: Diagnosis present

## 2011-01-15 DIAGNOSIS — E785 Hyperlipidemia, unspecified: Secondary | ICD-10-CM | POA: Diagnosis present

## 2011-01-15 DIAGNOSIS — Z85118 Personal history of other malignant neoplasm of bronchus and lung: Secondary | ICD-10-CM

## 2011-01-15 HISTORY — PX: ANTERIOR CERVICAL DECOMP/DISCECTOMY FUSION: SHX1161

## 2011-01-15 SURGERY — ANTERIOR CERVICAL DECOMPRESSION/DISCECTOMY FUSION 2 LEVELS
Anesthesia: General | Site: Neck | Wound class: Clean

## 2011-01-15 MED ORDER — OXYCODONE-ACETAMINOPHEN 5-325 MG PO TABS
1.0000 | ORAL_TABLET | ORAL | Status: DC | PRN
  Administered 2011-01-15 – 2011-01-16 (×2): 2 via ORAL
  Filled 2011-01-15 (×2): qty 2

## 2011-01-15 MED ORDER — GLYCOPYRROLATE 0.2 MG/ML IJ SOLN
INTRAMUSCULAR | Status: DC | PRN
  Administered 2011-01-15: .6 mg via INTRAVENOUS

## 2011-01-15 MED ORDER — FENTANYL CITRATE 0.05 MG/ML IJ SOLN
INTRAMUSCULAR | Status: DC | PRN
  Administered 2011-01-15: 100 ug via INTRAVENOUS
  Administered 2011-01-15 (×3): 50 ug via INTRAVENOUS
  Administered 2011-01-15: 100 ug via INTRAVENOUS

## 2011-01-15 MED ORDER — THROMBIN 5000 UNITS EX KIT
PACK | CUTANEOUS | Status: DC | PRN
  Administered 2011-01-15: 5000 [IU] via TOPICAL

## 2011-01-15 MED ORDER — MENTHOL 3 MG MT LOZG: 1.0000 | LOZENGE | OROMUCOSAL | Status: DC | PRN

## 2011-01-15 MED ORDER — LACTATED RINGERS IV SOLN
INTRAVENOUS | Status: DC | PRN
  Administered 2011-01-15: 11:00:00 via INTRAVENOUS

## 2011-01-15 MED ORDER — STERILE WATER FOR IRRIGATION IR SOLN
Status: DC | PRN
  Administered 2011-01-15: 1000 mL

## 2011-01-15 MED ORDER — DEXTROSE 5 % IV SOLN
INTRAVENOUS | Status: DC | PRN
  Administered 2011-01-15: 11:00:00 via INTRAVENOUS

## 2011-01-15 MED ORDER — MEPERIDINE HCL 25 MG/ML IJ SOLN: 6.2500 mg | INTRAMUSCULAR | Status: DC | PRN

## 2011-01-15 MED ORDER — TOBRAMYCIN SULFATE 80 MG/2ML IJ SOLN
80.0000 mg | Freq: Once | INTRAVENOUS | Status: AC
  Administered 2011-01-15: 80 mg via INTRAVENOUS
  Filled 2011-01-15: qty 2

## 2011-01-15 MED ORDER — NEOSTIGMINE METHYLSULFATE 1 MG/ML IJ SOLN
INTRAMUSCULAR | Status: DC | PRN
  Administered 2011-01-15: 5 mg via INTRAVENOUS

## 2011-01-15 MED ORDER — SODIUM CHLORIDE 0.9 % IR SOLN
Status: DC | PRN
  Administered 2011-01-15: 1000 mL

## 2011-01-15 MED ORDER — DEXAMETHASONE SODIUM PHOSPHATE 10 MG/ML IJ SOLN
10.0000 mg | Freq: Once | INTRAMUSCULAR | Status: DC
  Filled 2011-01-15: qty 1

## 2011-01-15 MED ORDER — VANCOMYCIN HCL IN DEXTROSE 1-5 GM/200ML-% IV SOLN
1000.0000 mg | Freq: Once | INTRAVENOUS | Status: AC
  Administered 2011-01-15: 1000 mg via INTRAVENOUS
  Filled 2011-01-15: qty 200

## 2011-01-15 MED ORDER — SODIUM CHLORIDE 0.9 % IJ SOLN: 3.0000 mL | INTRAMUSCULAR | Status: DC | PRN

## 2011-01-15 MED ORDER — OXYCODONE-ACETAMINOPHEN 7.5-325 MG PO TABS: 1.0000 | ORAL_TABLET | Freq: Four times a day (QID) | ORAL | Status: DC | PRN

## 2011-01-15 MED ORDER — HYDROMORPHONE HCL PF 1 MG/ML IJ SOLN
0.2500 mg | INTRAMUSCULAR | Status: DC | PRN
  Administered 2011-01-15 (×2): 0.25 mg via INTRAVENOUS

## 2011-01-15 MED ORDER — SIMVASTATIN 20 MG PO TABS
20.0000 mg | ORAL_TABLET | Freq: Every day | ORAL | Status: DC
  Filled 2011-01-15 (×2): qty 1

## 2011-01-15 MED ORDER — PROMETHAZINE HCL 25 MG/ML IJ SOLN: 6.2500 mg | INTRAMUSCULAR | Status: DC | PRN

## 2011-01-15 MED ORDER — HYDROMORPHONE HCL PF 1 MG/ML IJ SOLN: 0.5000 mg | INTRAMUSCULAR | Status: DC | PRN

## 2011-01-15 MED ORDER — EPHEDRINE SULFATE 50 MG/ML IJ SOLN
INTRAMUSCULAR | Status: DC | PRN
  Administered 2011-01-15: 5 mg via INTRAVENOUS

## 2011-01-15 MED ORDER — ZOLPIDEM TARTRATE 5 MG PO TABS: 5.0000 mg | ORAL_TABLET | Freq: Every evening | ORAL | Status: DC | PRN

## 2011-01-15 MED ORDER — ALPRAZOLAM 0.5 MG PO TABS: 0.5000 mg | ORAL_TABLET | Freq: Two times a day (BID) | ORAL | Status: DC | PRN

## 2011-01-15 MED ORDER — SODIUM CHLORIDE 0.9 % IV SOLN
500.0000 mg | Freq: Once | INTRAVENOUS | Status: AC
  Administered 2011-01-15: 500 mg via INTRAVENOUS
  Filled 2011-01-15: qty 500

## 2011-01-15 MED ORDER — ALBUTEROL SULFATE HFA 108 (90 BASE) MCG/ACT IN AERS
1.0000 | INHALATION_SPRAY | Freq: Four times a day (QID) | RESPIRATORY_TRACT | Status: DC | PRN
  Filled 2011-01-15: qty 6.7

## 2011-01-15 MED ORDER — MIDAZOLAM HCL 5 MG/5ML IJ SOLN
INTRAMUSCULAR | Status: DC | PRN
  Administered 2011-01-15: 2 mg via INTRAVENOUS

## 2011-01-15 MED ORDER — ONDANSETRON HCL 4 MG/2ML IJ SOLN: 4.0000 mg | INTRAMUSCULAR | Status: DC | PRN

## 2011-01-15 MED ORDER — SODIUM CHLORIDE 0.9 % IV SOLN
INTRAVENOUS | Status: DC | PRN
  Administered 2011-01-15: 11:00:00 via INTRAVENOUS

## 2011-01-15 MED ORDER — FUROSEMIDE 40 MG PO TABS
40.0000 mg | ORAL_TABLET | Freq: Every day | ORAL | Status: DC
  Filled 2011-01-15 (×2): qty 1

## 2011-01-15 MED ORDER — BACITRACIN 50000 UNITS IM SOLR
INTRAMUSCULAR | Status: DC | PRN
  Administered 2011-01-15: 12:00:00

## 2011-01-15 MED ORDER — ROCURONIUM BROMIDE 100 MG/10ML IV SOLN
INTRAVENOUS | Status: DC | PRN
  Administered 2011-01-15: 40 mg via INTRAVENOUS

## 2011-01-15 MED ORDER — ACETAMINOPHEN 325 MG PO TABS: 650.0000 mg | ORAL_TABLET | ORAL | Status: DC | PRN

## 2011-01-15 MED ORDER — PROPOFOL 10 MG/ML IV EMUL
INTRAVENOUS | Status: DC | PRN
  Administered 2011-01-15: 120 mg via INTRAVENOUS

## 2011-01-15 MED ORDER — ZOLPIDEM TARTRATE 10 MG PO TABS: 10.0000 mg | ORAL_TABLET | Freq: Every evening | ORAL | Status: DC | PRN

## 2011-01-15 MED ORDER — DEXAMETHASONE SODIUM PHOSPHATE 10 MG/ML IJ SOLN
INTRAMUSCULAR | Status: AC
  Filled 2011-01-15: qty 1

## 2011-01-15 MED ORDER — DEXAMETHASONE SODIUM PHOSPHATE 4 MG/ML IJ SOLN
INTRAMUSCULAR | Status: DC | PRN
  Administered 2011-01-15: 10 mg via INTRAVENOUS

## 2011-01-15 MED ORDER — PHENOL 1.4 % MT LIQD: 1.0000 | OROMUCOSAL | Status: DC | PRN

## 2011-01-15 MED ORDER — SENNA 8.6 MG PO TABS
1.0000 | ORAL_TABLET | Freq: Two times a day (BID) | ORAL | Status: DC
  Administered 2011-01-15: 8.6 mg via ORAL
  Filled 2011-01-15 (×3): qty 1

## 2011-01-15 MED ORDER — OXYCODONE-ACETAMINOPHEN 5-325 MG PO TABS
1.5000 | ORAL_TABLET | Freq: Four times a day (QID) | ORAL | Status: DC | PRN
  Administered 2011-01-16: 1.5 via ORAL
  Filled 2011-01-15: qty 2

## 2011-01-15 MED ORDER — ONDANSETRON HCL 4 MG/2ML IJ SOLN
INTRAMUSCULAR | Status: DC | PRN
  Administered 2011-01-15: 4 mg via INTRAVENOUS

## 2011-01-15 MED ORDER — VITAMIN D3 25 MCG (1000 UT) PO CAPS
1.0000 | ORAL_CAPSULE | Freq: Every day | ORAL | Status: DC
  Filled 2011-01-15 (×2): qty 1

## 2011-01-15 MED ORDER — DROPERIDOL 2.5 MG/ML IJ SOLN
INTRAMUSCULAR | Status: DC | PRN
  Administered 2011-01-15: 0.625 mg via INTRAVENOUS

## 2011-01-15 MED ORDER — PANTOPRAZOLE SODIUM 40 MG PO TBEC
40.0000 mg | DELAYED_RELEASE_TABLET | Freq: Every day | ORAL | Status: DC
  Filled 2011-01-15: qty 1

## 2011-01-15 MED ORDER — ACETAMINOPHEN 650 MG RE SUPP: 650.0000 mg | RECTAL | Status: DC | PRN

## 2011-01-15 SURGICAL SUPPLY — 60 items
APL SKNCLS STERI-STRIP NONHPOA (GAUZE/BANDAGES/DRESSINGS) ×1
BAG DECANTER FOR FLEXI CONT (MISCELLANEOUS) ×2 IMPLANT
BENZOIN TINCTURE PRP APPL 2/3 (GAUZE/BANDAGES/DRESSINGS) ×2 IMPLANT
BRUSH SCRUB EZ PLAIN DRY (MISCELLANEOUS) ×2 IMPLANT
BUR MATCHSTICK NEURO 3.0 LAGG (BURR) ×2 IMPLANT
CANISTER SUCTION 2500CC (MISCELLANEOUS) ×2 IMPLANT
CLOSURE STERI STRIP 1/2 X4 (GAUZE/BANDAGES/DRESSINGS) ×1 IMPLANT
CLOTH BEACON ORANGE TIMEOUT ST (SAFETY) ×2 IMPLANT
COLLAR UNIVERSAL (SOFTGOODS) ×1 IMPLANT
CONT SPEC 4OZ CLIKSEAL STRL BL (MISCELLANEOUS) ×2 IMPLANT
DRAPE C-ARM 42X72 X-RAY (DRAPES) ×4 IMPLANT
DRAPE LAPAROTOMY 100X72 PEDS (DRAPES) ×2 IMPLANT
DRAPE MICROSCOPE ZEISS OPMI (DRAPES) ×2 IMPLANT
DRAPE POUCH INSTRU U-SHP 10X18 (DRAPES) ×2 IMPLANT
DRILL BIT (BIT) ×1 IMPLANT
ELECT COATED BLADE 2.86 ST (ELECTRODE) ×2 IMPLANT
ELECT REM PT RETURN 9FT ADLT (ELECTROSURGICAL) ×2
ELECTRODE REM PT RTRN 9FT ADLT (ELECTROSURGICAL) ×1 IMPLANT
GAUZE SPONGE 4X4 16PLY XRAY LF (GAUZE/BANDAGES/DRESSINGS) IMPLANT
GLOVE BIO SURGEON STRL SZ8 (GLOVE) ×1 IMPLANT
GLOVE BIOGEL PI IND STRL 7.0 (GLOVE) IMPLANT
GLOVE BIOGEL PI INDICATOR 7.0 (GLOVE) ×2
GLOVE ECLIPSE 7.5 STRL STRAW (GLOVE) ×2 IMPLANT
GLOVE ECLIPSE 8.5 STRL (GLOVE) ×2 IMPLANT
GLOVE EXAM NITRILE LRG STRL (GLOVE) IMPLANT
GLOVE EXAM NITRILE MD LF STRL (GLOVE) ×1 IMPLANT
GLOVE EXAM NITRILE XL STR (GLOVE) IMPLANT
GLOVE EXAM NITRILE XS STR PU (GLOVE) IMPLANT
GLOVE INDICATOR 7.5 STRL GRN (GLOVE) ×1 IMPLANT
GLOVE INDICATOR 8.5 STRL (GLOVE) ×1 IMPLANT
GLOVE SURG SS PI 6.5 STRL IVOR (GLOVE) ×3 IMPLANT
GOWN BRE IMP SLV AUR LG STRL (GOWN DISPOSABLE) ×2 IMPLANT
GOWN BRE IMP SLV AUR XL STRL (GOWN DISPOSABLE) ×4 IMPLANT
GOWN STRL REIN 2XL LVL4 (GOWN DISPOSABLE) IMPLANT
HEAD HALTER (SOFTGOODS) ×2 IMPLANT
HEMOSTAT SURGICEL 2X14 (HEMOSTASIS) IMPLANT
KIT BASIN OR (CUSTOM PROCEDURE TRAY) ×2 IMPLANT
KIT ROOM TURNOVER OR (KITS) ×2 IMPLANT
NDL SPNL 20GX3.5 QUINCKE YW (NEEDLE) ×1 IMPLANT
NEEDLE SPNL 20GX3.5 QUINCKE YW (NEEDLE) ×2 IMPLANT
NS IRRIG 1000ML POUR BTL (IV SOLUTION) ×2 IMPLANT
PACK LAMINECTOMY NEURO (CUSTOM PROCEDURE TRAY) ×2 IMPLANT
PAD ARMBOARD 7.5X6 YLW CONV (MISCELLANEOUS) ×6 IMPLANT
PLATE ATL VISION 40MM (Plate) ×1 IMPLANT
RUBBERBAND STERILE (MISCELLANEOUS) ×4 IMPLANT
SCREW VA ALT VISION 4.0X13 (Screw) ×6 IMPLANT
SPACER BONE CORNERSTONE 5X14 (Orthopedic Implant) ×1 IMPLANT
SPACER BONE CORNERSTONE 6X14 (Orthopedic Implant) ×1 IMPLANT
SPONGE GAUZE 4X4 12PLY (GAUZE/BANDAGES/DRESSINGS) ×2 IMPLANT
SPONGE INTESTINAL PEANUT (DISPOSABLE) ×2 IMPLANT
SPONGE SURGIFOAM ABS GEL SZ50 (HEMOSTASIS) ×2 IMPLANT
STRIP CLOSURE SKIN 1/2X4 (GAUZE/BANDAGES/DRESSINGS) ×2 IMPLANT
SUT PDS AB 5-0 P3 18 (SUTURE) ×2 IMPLANT
SUT VIC AB 3-0 SH 8-18 (SUTURE) ×3 IMPLANT
SYR 20ML ECCENTRIC (SYRINGE) ×2 IMPLANT
TAPE CLOTH 4X10 WHT NS (GAUZE/BANDAGES/DRESSINGS) ×1 IMPLANT
TAPE CLOTH SURG 4X10 WHT LF (GAUZE/BANDAGES/DRESSINGS) ×1 IMPLANT
TOWEL OR 17X24 6PK STRL BLUE (TOWEL DISPOSABLE) ×2 IMPLANT
TOWEL OR 17X26 10 PK STRL BLUE (TOWEL DISPOSABLE) ×2 IMPLANT
WATER STERILE IRR 1000ML POUR (IV SOLUTION) ×2 IMPLANT

## 2011-01-15 NOTE — Anesthesia Preprocedure Evaluation (Addendum)
Anesthesia Evaluation  Patient identified by MRN, date of birth, ID band Patient awake    Reviewed: Allergy & Precautions, NPO status , Patient's Chart, lab work & pertinent test results  History of Anesthesia Complications Negative for: history of anesthetic complications  Airway Mallampati: II  Neck ROM: Full    Dental  (+) Edentulous Upper and Edentulous Lower   Pulmonary pneumonia , COPD COPD inhaler,  clear to auscultation        Cardiovascular hypertension, + Valvular Problems/Murmurs AI and MR + Systolic murmurs    Neuro/Psych PSYCHIATRIC DISORDERS  Neuromuscular disease    GI/Hepatic Neg liver ROS, GERD-  ,  Endo/Other  Negative Endocrine ROS  Renal/GU negative Renal ROS     Musculoskeletal   Abdominal   Peds  Hematology negative hematology ROS (+)   Anesthesia Other Findings   Reproductive/Obstetrics                           Anesthesia Physical Anesthesia Plan  ASA: III  Anesthesia Plan: General   Post-op Pain Management:    Induction: Intravenous  Airway Management Planned: Oral ETT  Additional Equipment:   Intra-op Plan:   Post-operative Plan: Extubation in OR  Informed Consent: I have reviewed the patients History and Physical, chart, labs and discussed the procedure including the risks, benefits and alternatives for the proposed anesthesia with the patient or authorized representative who has indicated his/her understanding and acceptance.     Plan Discussed with: CRNA and Surgeon  Anesthesia Plan Comments:        Anesthesia Quick Evaluation

## 2011-01-15 NOTE — Discharge Summary (Signed)
Physician Discharge Summary  Patient ID: [name] MRN: [id number] DOB/AGE: 70-May-1942 70 y.o.  Admit date: 01/15/2011 Discharge date: 01/15/2011  Admission Diagnoses:  Discharge Diagnoses:  Principal Problem:  *Cervical cord compression with myelopathy   Discharged Condition: good  Hospital Course: For treatment of symptomatic cervical stenosis with myelopathy. She underwent an uncomplicated two-level decompression and fusion with good results. Patient reports much improved bilateral upper extremity sensation and motor control. Wound healing well. Patient swallowing well. Airway well maintained.  Consults: none  Significant Diagnostic Studies: None  Treatments: surgery  Discharge Exam: Blood pressure 137/94, pulse 78, temperature 97.9 F (36.6 C), temperature source Oral, resp. rate 16, SpO2 97.00%. Awake and alert oriented appropriate. Cranial nerve function is intact. Motor and sensory function extremities is normal. Wound healing well. Next soft.  Disposition: Home or Self Care  Discharge Orders    Future Appointments: Provider: Department: Dept Phone: Center:   05/06/2011 10:15 AM [name], MD [facility] [phone] [facility]   09/20/2011 8:30 AM [facility] [phone] [place]     Current Discharge Medication List    CONTINUE these medications which have CHANGED   Details  oxyCODONE-acetaminophen (PERCOCET) 7.5-325 MG per tablet Take 1 tablet by mouth every 6 (six) hours as needed for pain. For pain. Qty: 60 tablet, Refills: 0      CONTINUE these medications which have NOT CHANGED   Details  alendronate (FOSAMAX) 70 MG tablet Take 70 mg by mouth every 7 (seven) days. Take with a full glass of water on an empty stomach. Patient takes on Mondays.     ALPRAZolam (XANAX) 0.5 MG tablet Take 0.5 mg by mouth 2 (two) times daily as needed.      Cholecalciferol (VITAMIN D3) 1000 UNITS CAPS Take 1 capsule by mouth daily.     furosemide (LASIX) 40 MG tablet  Take 40 mg by mouth daily.      omeprazole (PRILOSEC) 20 MG capsule Take 40 mg by mouth daily.      pravastatin (PRAVACHOL) 40 MG tablet Take 40 mg by mouth daily.      zolpidem (AMBIEN) 5 MG tablet Take 5 mg by mouth at bedtime as needed. For sleep.     albuterol (PROVENTIL HFA) 108 (90 BASE) MCG/ACT inhaler Inhale 1-2 puffs into the lungs 4 (four) times daily as needed. For wheezing or shortness of breath. Qty: 1 Inhaler, Refills: 5         Signed: Fotios Amos A 01/15/2011, 6:14 PM

## 2011-01-15 NOTE — H&P (Signed)
[name] is an 70 y.o. female.   Chief Complaint: Right sided weakness HPI: 70 year old patient with progressive right upper and lower chamois weakness. His symptoms consistent with a progressive cervical myelopathy. Workup demonstrates evidence of very severe stenosis with spinal cord signal abnormality at C3-4 and C4-5. Patient presents now for anterior cervical decompression infusion in hopes of improvement of her symptoms.  Past Medical History  Diagnosis Date  . Cancer     lung ca  . 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 HYPERLIPIDEMIA 11/18/2006  . HYPERKALEMIA 02/05/2008  . ANEMIA-IRON DEFICIENCY 09/22/2008  . Anemia of other chronic disease 02/05/2008  . ANXIETY 06/03/2008  . DEPRESSION 04/28/2009  . HYPERTENSION 11/18/2006  . PERICARDITIS 01/03/2007  . AORTIC STENOSIS/ INSUFFICIENCY, NON-RHEUMATIC 12/03/2008  . SINUSITIS- ACUTE-NOS 11/20/2007  . SINUSITIS, CHRONIC 02/05/2008  . ALLERGIC RHINITIS 11/18/2006  . PNEUMONIA 05/02/2007  . C O P D 06/27/2008  . Stricture and stenosis of esophagus 11/14/2008  . GERD 01/03/2007  . BARRETTS ESOPHAGUS 11/14/2008  . RENAL INSUFFICIENCY 02/05/2008  . DYSPEPSIA 03/23/2007  . PRURITUS 10/08/2008  . OSTEOARTHRITIS 11/18/2006  . OSTEOARTHRITIS, KNEE, LEFT 01/03/2007  . Cervicalgia 01/13/2009  . SPONDYLOSIS, CERVICAL, WITH RADICULOPATHY 01/13/2009  . BACK PAIN 02/04/2009  . BURSITIS, LEFT HIP 04/18/2009  . OSTEOPOROSIS 11/18/2006  . INSOMNIA-SLEEP DISORDER-UNSPEC 06/03/2008  . FATIGUE 04/18/2009  . PERIPHERAL EDEMA 02/13/2009  . MURMUR 11/18/2006  . DYSPHAGIA UNSPECIFIED 03/23/2007  . Abdominal pain, generalized 03/01/2007  . Nonspecific (abnormal) findings on radiological and other examination of body structure 06/03/2008  . Tuberculin Test Reaction 11/18/2006  . Acute bronchitis 02/23/2010  . CHOLELITHIASIS 04/24/2010  . NEPHROLITHIASIS, HX OF 04/24/2010  . Polyarthralgia 06/09/2010  . Elevated sed rate 06/11/2010  . Positive ANA (antinuclear  antibody) 06/11/2010  . Rheumatoid factor positive 06/11/2010  . Cervical radiculopathy 09/28/2010  . Lumbar radiculopathy 09/28/2010    Past Surgical History  Procedure Date  . Abdominal hysterectomy   . Tubal ligation   . Tumor removal 08/06/08    from lungs    Family History  Problem Relation Age of Onset  . Hyperlipidemia Other   . Hypertension Other   . Coronary artery disease Other     several nephrew   Social History:  reports that she quit smoking about 4 years ago. Her smoking use included Cigarettes. She has a 60 pack-year smoking history. She has quit using smokeless tobacco. She reports that she does not drink alcohol or use illicit drugs.  Allergies:  Allergies  Allergen Reactions  . Amoxicillin     REACTION: unknown reaction  . Codeine     REACTION: rash/hives  . Fentanyl     REACTION: hallucinations  . Hydrocodone     REACTION: ineffective  . Penicillins     REACTION: rash/hives    Medications Prior to Admission  Medication Dose Route Frequency Provider Last Rate Last Dose  . dexamethasone (DECADRON) 10 MG/ML injection           . dexamethasone (DECADRON) injection 10 mg  10 mg Intravenous Once [employer]      . tobramycin (NEBCIN) 80 mg in dextrose 5 % 50 mL IVPB  80 mg Intravenous Once [employer]      . vancomycin (VANCOCIN) 500 mg in sodium chloride 0.9 % 100 mL IVPB  500 mg Intravenous Once [employer]       Medications Prior to Admission  Medication Sig Dispense Refill  . Cholecalciferol (VITAMIN D3)  1000 UNITS CAPS Take 1 capsule by mouth daily.       . furosemide (LASIX) 40 MG tablet Take 40 mg by mouth daily.        . pravastatin (PRAVACHOL) 40 MG tablet Take 40 mg by mouth daily.        Marland Kitchen zolpidem (AMBIEN) 5 MG tablet Take 5 mg by mouth at bedtime as needed. For sleep.         No results found for this or any previous visit (from the past 48 hour(s)). No results found.  Review of Systems  All other systems reviewed and are  negative.    Blood pressure 148/75, pulse 74, temperature 98 F (36.7 C), temperature source Oral, resp. rate 18, SpO2 96.00%. Physical Exam  Constitutional: She is oriented to person, place, and time. She appears well-developed and well-nourished.  HENT:  Head: Normocephalic and atraumatic.  Right Ear: External ear normal.  Left Ear: External ear normal.  Eyes: Conjunctivae and EOM are normal. Pupils are equal, round, and reactive to light. Right eye exhibits no discharge. Left eye exhibits no discharge.  Neck: Normal range of motion. Neck supple. No tracheal deviation present. No thyromegaly present.  Cardiovascular: Normal rate, regular rhythm and normal heart sounds.   Respiratory: Effort normal and breath sounds normal. No respiratory distress. She has no wheezes.  GI: Soft. Bowel sounds are normal.  Neurological: She is alert and oriented to person, place, and time. She has normal reflexes. No cranial nerve deficit. Coordination normal.  Skin: Skin is warm and dry.  Psychiatric: She has a normal mood and affect. Her behavior is normal. Judgment and thought content normal.     Assessment/Plan Cervical stenosis with myelopathy. Plan C3-4 C4-5 anterior cervical discectomy and fusion with allograft and anterior plating.  Catheryn Slifer A 01/15/2011, 10:43 AM

## 2011-01-15 NOTE — Brief Op Note (Signed)
01/15/2011  1:01 PM  PATIENT:  [name]  70 y.o. female  PRE-OPERATIVE DIAGNOSIS:  Cervical three-four, four-five stenosis  POST-OPERATIVE DIAGNOSIS:  Cervical three-four, four-five stenosis  PROCEDURE:  Procedure(s): ANTERIOR CERVICAL DECOMPRESSION/DISCECTOMY FUSION 2 LEVELS  SURGEON:  Surgeon(s): Sherilyn Cooter A Jayron Maqueda Mariam Dollar  PHYSICIAN ASSISTANT:   ASSISTANTS: none   ANESTHESIA:   general  EBL:  Total I/O In: 650 [I.V.:650] Out: -   BLOOD ADMINISTERED:none  DRAINS: none   LOCAL MEDICATIONS USED:  NONE  SPECIMEN:  No Specimen  DISPOSITION OF SPECIMEN:  N/A  COUNTS:  YES  TOURNIQUET:  * No tourniquets in log *  DICTATION: .Dragon Dictation  PLAN OF CARE: Admit to inpatient   PATIENT DISPOSITION:  PACU - hemodynamically stable.   Delay start of Pharmacological VTE agent (>24hrs) due to surgical blood loss or risk of bleeding: yes

## 2011-01-15 NOTE — Plan of Care (Signed)
Problem: Consults Goal: Diagnosis - Spinal Surgery Cervical Spine Fusion     

## 2011-01-15 NOTE — Transfer of Care (Signed)
Immediate Anesthesia Transfer of Care Note  Patient: [name]  Procedure(s) Performed:  ANTERIOR CERVICAL DECOMPRESSION/DISCECTOMY FUSION 2 LEVELS - Cervical Three-Four, Cervical Four-Five Anterior Cervical Decompression Fusion   Patient Location: PACU  Anesthesia Type: General  Level of Consciousness: awake, alert , oriented, patient cooperative and responds to stimulation  Airway & Oxygen Therapy: Patient Spontanous Breathing and Patient connected to nasal cannula oxygen  Post-op Assessment: Report given to PACU RN and Post -op Vital signs reviewed and stable  Post vital signs: Reviewed and stable  Complications: No apparent anesthesia complications

## 2011-01-15 NOTE — Op Note (Signed)
Date of procedure: 01/15/2011  Date of dictation: Same  Service: Neurosurgery  Preoperative diagnosis: C3-4 C4-5 stenosis with myelopathy.  Postoperative diagnosis: Same  Procedure Name: C3-4 C4-5 anterior cervical discectomy and fusion with allograft and anterior plating.  Surgeon:Ozetta Flatley A.Naomi Fitton, M.D.  Asst. Surgeon: Wynetta Emery  Anesthesia: General  Indication: Patient is a 70 year old female with a history of neck pain C. symptoms consistent with progressive cervical myelopathy. Workup demonstrates evidence of severe stenosis at C3-4 and C4-5. Patient presents now for two-level anterior decompression and fusion.  Operative note: Patient is taken to the operative room placed in that table in the supine position. After an adequate level of anesthesia achieved, patient is position with her neck slightly extended and held in place with halter traction. The patient's anterior cervical region was prepped and draped sterilely. Lahey spica linear skin incision overlying C4 vertebral level. This is carried down sharply to the platysma. Platysmas and divided in a vertical fashion and dissection proceeded along the medial border of the sternocleidomastoid muscle. The trachea esophagus or mobilize defect was left. Prevertebral fascia stripped off the anterior spinal column. Longus coli was elevated bilaterally using electrocautery. Deep self-retaining placed intraoperative fluoroscopy used levels were confirmed.The C3-4 and C4-5 in size 15 blade in a rectangular fashion. Y. disc space cleanout was achieved using pituitary rongeurs forward and backward angle Carlen curettes Kerrison rongeurs and a high-speed drill. All elements of the disc removed at the level of the posterior annulus. Microscope was then brought into the field used throughout the remainder of the discectomy. Using a high-speed drill the remaining aspects of the annulus and osteophytes removed down to the level of the posterior longitudinal  ligament. Posterior Archuleta as an elevator second piece of fashion using Kerrison rongeurs. Thecal sac was identified. Right central decompression performed undercutting the bodies of C3 and C4. Decompression and procedural foramina. Wide anterior foraminotomies were then performed on course exiting C4 nerve roots bilaterally. At this point a very thorough decompression achieved. Ears nose injury thecal sac or nerve roots. Procedure is repeated C4-5 again without complication. Wound was then irrigated with antibiotic solution. Hemostasis was assured with Gelfoam and bipolar cautery. Cornerstone allograft wedges were then packed into place at both the 34 and C4-5. Both grafts were recessed proximally 1 mm from the intracortical surface. A 40 mm Atlantis anterior cervical plate was then placed over the C3-4 and 5 levels. This is attached under fluoroscopic guidance and 13 mm variable screws 2 each all 3 levels. All 6 screws final tightening done we solidly within bone. Locking screws were engaged L3 level. The final images revealed good position of the bone graft and hardware at the proper operative level with normal alignment of the spine. Wound is irrigated one final time. Hemostasis was achieved with bipolar cautery. Wounds and closed in a typical fashion. Steri-Strips triggers were applied. There were no apparent locations. Patient tolerated the procedure well and returned to the recovery postop.

## 2011-01-15 NOTE — Anesthesia Postprocedure Evaluation (Signed)
  Anesthesia Post-op Note  Patient: [name]  Procedure(s) Performed:  ANTERIOR CERVICAL DECOMPRESSION/DISCECTOMY FUSION 2 LEVELS - Cervical Three-Four, Cervical Four-Five Anterior Cervical Decompression Fusion   Patient Location: PACU and NICU  Anesthesia Type: General  Level of Consciousness: awake  Airway and Oxygen Therapy: Patient Spontanous Breathing  Post-op Pain: mild  Post-op Assessment: Post-op Vital signs reviewed  Post-op Vital Signs: stable  Complications: No apparent anesthesia complications

## 2011-01-15 NOTE — Progress Notes (Signed)
Patient ID: [name], female   DOB: 10/11/1940, 70 y.o.   MRN: [id number] Postoperatively doing well. He reports improved sensation and motor control. Wound healing well. Swallowing well.

## 2011-01-15 NOTE — Progress Notes (Signed)
1310 50 of Fentanyl per [employer]

## 2011-01-15 NOTE — Preoperative (Signed)
Beta Blockers   Reason not to administer Beta Blockers:Not Applicable 

## 2011-01-15 NOTE — Anesthesia Procedure Notes (Addendum)
Procedure Name: Intubation Date/Time: 01/15/2011 11:07 AM Performed by: Wray Kearns A Pre-anesthesia Checklist: Timeout performed, Patient identified, Emergency Drugs available, Suction available and Patient being monitored Patient Re-evaluated:Patient Re-evaluated prior to inductionOxygen Delivery Method: Circle System Utilized Preoxygenation: Pre-oxygenation with 100% oxygen Intubation Type: IV induction Ventilation: Mask ventilation without difficulty Laryngoscope Size: Mac and 3 Grade View: Grade I Tube type: Oral Tube size: 7.5 mm Number of attempts: 1 Airway Equipment and Method: stylet Placement Confirmation: ETT inserted through vocal cords under direct vision,  breath sounds checked- equal and bilateral and positive ETCO2 Secured at: 21 cm Tube secured with: Tape Dental Injury: Teeth and Oropharynx as per pre-operative assessment

## 2011-01-18 MED FILL — Sodium Chloride Irrigation Soln 0.9%: Qty: 500 | Status: AC

## 2011-01-19 ENCOUNTER — Encounter (HOSPITAL_COMMUNITY): Payer: Self-pay | Admitting: Neurosurgery

## 2011-03-01 ENCOUNTER — Telehealth: Payer: Self-pay | Admitting: Cardiovascular Disease

## 2011-03-01 NOTE — Telephone Encounter (Signed)
New Msg: Pt calling wanting to discuss pt seeing Dr. Clifton James ASAP. Pt would like to discuss with MD about pt heart murmur. Please return pt call to discuss further.

## 2011-03-01 NOTE — Telephone Encounter (Signed)
Spoke with pt. She reports occasional  fluttering in chest which started last Friday. Is not constant. No shortness of breath or chest pain. Describes as fluttering on left side near rib cage. Seems worse when she lies down at night. She does not know heart rate or blood pressure. She has appt with Dr. Clifton James on January 29 already scheduled but would like early appt.   I offered her appt on March 04, 2011 with Dr. Clifton James but she would like to see someone tomorrow.  Appt made for pt to see Norma Fredrickson, NP at 11:45.

## 2011-03-02 ENCOUNTER — Ambulatory Visit (INDEPENDENT_AMBULATORY_CARE_PROVIDER_SITE_OTHER): Payer: Medicare Other | Admitting: Nurse Practitioner

## 2011-03-02 ENCOUNTER — Encounter: Payer: Self-pay | Admitting: Nurse Practitioner

## 2011-03-02 VITALS — BP 132/68 | HR 76 | Ht 62.0 in | Wt 167.0 lb

## 2011-03-02 DIAGNOSIS — C3490 Malignant neoplasm of unspecified part of unspecified bronchus or lung: Secondary | ICD-10-CM

## 2011-03-02 DIAGNOSIS — I10 Essential (primary) hypertension: Secondary | ICD-10-CM

## 2011-03-02 DIAGNOSIS — R002 Palpitations: Secondary | ICD-10-CM | POA: Insufficient documentation

## 2011-03-02 NOTE — Patient Instructions (Signed)
We are going to put a monitor on you to watch your rhythm.  We are going to check some labs today.  Keep your appointment for later this month with Dr. Clifton James.   Call the [facility] office at [phone] if you have any questions, problems or concerns.

## 2011-03-02 NOTE — Assessment & Plan Note (Signed)
Blood pressure looks ok. No change in her current medicines. 

## 2011-03-02 NOTE — Assessment & Plan Note (Signed)
She reports that she is fine from this standpoint. Follow up per Dr. Shirline Frees.

## 2011-03-02 NOTE — Assessment & Plan Note (Signed)
We will place an event monitor today. We will check labs today. She is to keep her appointment with Dr. Clifton James for later this month. I have left her medicines as they are for now. I explained to her that we need to "catch" an episode. She has had a recent echo. Further disposition to follow. Patient is agreeable to this plan and will call if any problems develop in the interim.

## 2011-03-02 NOTE — Progress Notes (Signed)
[name] Date of Birth: [birth year] Medical Record #[id number]  History of Present Illness: [name] is seen today for a work in visit. She is seen for Dr. Clifton James. She has multiple medical issues including lung cancer, aortic valve disease and HTN. Does not have any known CAD.   She presents today with complaint of palpitations. She is here with her daughter. This started on Friday. She describes it as her heart "fluttering". It has been worse at night. It really does not make her feel bad but seems to be more of a nuisance. She denies being lightheaded or dizzy. No chest pain. Not short of breath. Has had recent echo in October showing mild to moderate AI. She says things are fine from her cancer standpoint. She does not use a lot of caffeine. Tries to stay active. Does not seem anxious.   Current Outpatient Prescriptions on File Prior to Visit  Medication Sig Dispense Refill  . albuterol (PROVENTIL HFA) 108 (90 BASE) MCG/ACT inhaler Inhale 1-2 puffs into the lungs 4 (four) times daily as needed. For wheezing or shortness of breath.  1 Inhaler  5  . alendronate (FOSAMAX) 70 MG tablet Take 70 mg by mouth every 7 (seven) days. Take with a full glass of water on an empty stomach. Patient takes on Mondays.       . ALPRAZolam (XANAX) 0.5 MG tablet Take 0.5 mg by mouth 2 (two) times daily as needed.        . Cholecalciferol (VITAMIN D3) 1000 UNITS CAPS Take 1 capsule by mouth daily.       . furosemide (LASIX) 40 MG tablet Take 40 mg by mouth daily.        Marland Kitchen omeprazole (PRILOSEC) 20 MG capsule Take 40 mg by mouth daily.        Marland Kitchen oxyCODONE-acetaminophen (PERCOCET) 7.5-325 MG per tablet Take 1 tablet by mouth every 6 (six) hours as needed for pain. For pain.  60 tablet  0  . pravastatin (PRAVACHOL) 40 MG tablet Take 40 mg by mouth daily.        Marland Kitchen zolpidem (AMBIEN) 5 MG tablet Take 5 mg by mouth at bedtime as needed. For sleep.       Marland Kitchen gabapentin (NEURONTIN) 100 MG capsule Take 100 mg by mouth  daily.        Marland Kitchen DISCONTD: oxyCODONE-acetaminophen (PERCOCET) 7.5-325 MG per tablet Take 1 tablet by mouth every 6 (six) hours as needed for pain.  60 tablet  0  . DISCONTD: zolpidem (AMBIEN) 5 MG tablet Take 1 tablet (5 mg total) by mouth at bedtime as needed for sleep.  30 tablet  5    Allergies  Allergen Reactions  . Amoxicillin     REACTION: unknown reaction  . Codeine     REACTION: rash/hives  . Fentanyl     REACTION: hallucinations  . Hydrocodone     REACTION: ineffective  . Penicillins     REACTION: rash/hives    Past Medical History  Diagnosis Date  . Cancer     lung ca  . 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 HYPERLIPIDEMIA 11/18/2006  . HYPERKALEMIA 02/05/2008  . ANEMIA-IRON DEFICIENCY 09/22/2008  . Anemia of other chronic disease 02/05/2008  . ANXIETY 06/03/2008  . DEPRESSION 04/28/2009  . HYPERTENSION 11/18/2006  . PERICARDITIS 01/03/2007  . AORTIC STENOSIS/ INSUFFICIENCY, NON-RHEUMATIC 12/03/2008  . SINUSITIS- ACUTE-NOS 11/20/2007  . SINUSITIS, CHRONIC 02/05/2008  . ALLERGIC RHINITIS 11/18/2006  . PNEUMONIA 05/02/2007  .  C O P D 06/27/2008  . Stricture and stenosis of esophagus 11/14/2008  . GERD 01/03/2007  . BARRETTS ESOPHAGUS 11/14/2008  . RENAL INSUFFICIENCY 02/05/2008  . DYSPEPSIA 03/23/2007  . PRURITUS 10/08/2008  . OSTEOARTHRITIS 11/18/2006  . OSTEOARTHRITIS, KNEE, LEFT 01/03/2007  . Cervicalgia 01/13/2009  . SPONDYLOSIS, CERVICAL, WITH RADICULOPATHY 01/13/2009  . BACK PAIN 02/04/2009  . BURSITIS, LEFT HIP 04/18/2009  . OSTEOPOROSIS 11/18/2006  . INSOMNIA-SLEEP DISORDER-UNSPEC 06/03/2008  . FATIGUE 04/18/2009  . PERIPHERAL EDEMA 02/13/2009  . MURMUR 11/18/2006  . DYSPHAGIA UNSPECIFIED 03/23/2007  . Abdominal pain, generalized 03/01/2007  . Nonspecific (abnormal) findings on radiological and other examination of body structure 06/03/2008  . Tuberculin Test Reaction 11/18/2006  . Acute bronchitis 02/23/2010  . CHOLELITHIASIS 04/24/2010  . NEPHROLITHIASIS, HX OF  04/24/2010  . Polyarthralgia 06/09/2010  . Elevated sed rate 06/11/2010  . Positive ANA (antinuclear antibody) 06/11/2010  . Rheumatoid factor positive 06/11/2010  . Cervical radiculopathy 09/28/2010  . Lumbar radiculopathy 09/28/2010    Past Surgical History  Procedure Date  . Abdominal hysterectomy   . Tubal ligation   . Tumor removal 08/06/08    from lungs  . Anterior cervical decomp/discectomy fusion 01/15/2011    Procedure: ANTERIOR CERVICAL DECOMPRESSION/DISCECTOMY FUSION 2 LEVELS;  Surgeon: Kathaleen Maser Pool;  Location: [facility];  Service: Neurosurgery;  Laterality: N/A;  Cervical Three-Four, Cervical Four-Five Anterior Cervical Decompression Fusion     History  Smoking status  . Former Smoker -- 1.5 packs/day for 40 years  . Types: Cigarettes  . Quit date: 02/15/2006  Smokeless tobacco  . Former User    History  Alcohol Use No    Family History  Problem Relation Age of Onset  . Hyperlipidemia Other   . Hypertension Other   . Coronary artery disease Other     several nephrew    Review of Systems: The review of systems is positive for palpitations.  All other systems were reviewed and are negative.  Physical Exam: BP 132/68  Pulse 76  Ht 5\' 2"  (1.575 m)  Wt 167 lb (75.751 kg)  BMI 30.54 kg/m2 Patient is very pleasant and in no acute distress. Skin is warm and dry. Color is normal.  HEENT is unremarkable. Normocephalic/atraumatic. PERRL. Sclera are nonicteric. Neck is supple. No masses. No JVD. Lungs are clear. Cardiac exam shows a regular rate and rhythm. She has a systolic murmur noted. Abdomen is obese but soft. Extremities are full but without edema. Gait and ROM are intact. No gross neurologic deficits noted.  LABORATORY DATA: EKG shows sinus rhythm. Tracing is unchanged from prior EKG in October.    Assessment / Plan:

## 2011-03-02 NOTE — Telephone Encounter (Signed)
Thanks, cdm 

## 2011-03-04 ENCOUNTER — Other Ambulatory Visit: Payer: Self-pay | Admitting: Nurse Practitioner

## 2011-03-04 ENCOUNTER — Encounter: Payer: Self-pay | Admitting: Nurse Practitioner

## 2011-03-04 LAB — CBC
HCT: 33.4 % — ABNORMAL LOW (ref 36.0–46.0)
Hemoglobin: 10.9 g/dL — ABNORMAL LOW (ref 12.0–15.0)
MCH: 33.3 pg (ref 26.0–34.0)
MCHC: 32.6 g/dL (ref 30.0–36.0)
MCV: 102.1 fL — ABNORMAL HIGH (ref 78.0–100.0)
Platelets: 220 10*3/uL (ref 150–400)
RBC: 3.27 MIL/uL — ABNORMAL LOW (ref 3.87–5.11)
RDW: 13 % (ref 11.5–15.5)
WBC: 4.8 10*3/uL (ref 4.0–10.5)

## 2011-03-04 LAB — BASIC METABOLIC PANEL
BUN: 21 mg/dL (ref 6–23)
CO2: 28 mEq/L (ref 19–32)
Calcium: 8.9 mg/dL (ref 8.4–10.5)
Chloride: 99 mEq/L (ref 96–112)
Creat: 1.38 mg/dL — ABNORMAL HIGH (ref 0.50–1.10)
Glucose, Bld: 87 mg/dL (ref 70–99)
Potassium: 4.4 mEq/L (ref 3.5–5.3)
Sodium: 135 mEq/L (ref 135–145)

## 2011-03-05 LAB — TSH: TSH: 2.83 u[IU]/mL (ref 0.350–4.500)

## 2011-03-11 ENCOUNTER — Ambulatory Visit
Admission: RE | Admit: 2011-03-11 | Discharge: 2011-03-11 | Disposition: A | Discharge status: Home / Self Care | Payer: Medicare Other | Source: Ambulatory Visit | Attending: Neurosurgery | Admitting: Neurosurgery

## 2011-03-11 ENCOUNTER — Other Ambulatory Visit: Payer: Self-pay | Admitting: Neurosurgery

## 2011-03-11 DIAGNOSIS — M542 Cervicalgia: Secondary | ICD-10-CM

## 2011-03-16 ENCOUNTER — Other Ambulatory Visit: Payer: Medicare Other | Admitting: *Deleted

## 2011-03-16 ENCOUNTER — Encounter: Payer: Self-pay | Admitting: Cardiovascular Disease

## 2011-03-16 ENCOUNTER — Ambulatory Visit (INDEPENDENT_AMBULATORY_CARE_PROVIDER_SITE_OTHER): Payer: Medicare Other | Admitting: Cardiovascular Disease

## 2011-03-16 VITALS — BP 150/61 | HR 78 | Ht 60.0 in | Wt 168.0 lb

## 2011-03-16 DIAGNOSIS — R002 Palpitations: Secondary | ICD-10-CM | POA: Insufficient documentation

## 2011-03-16 DIAGNOSIS — M79606 Pain in leg, unspecified: Secondary | ICD-10-CM

## 2011-03-16 DIAGNOSIS — M79609 Pain in unspecified limb: Secondary | ICD-10-CM

## 2011-03-16 NOTE — Assessment & Plan Note (Signed)
Will get ABI to exclude PAD 

## 2011-03-16 NOTE — Patient Instructions (Signed)
Your physician wants you to follow-up in: 6 month.  You will receive a reminder letter in the mail two months in advance. If you don't receive a letter, please call our office to schedule the follow-up appointment.  Your physician has requested that you have an ankle brachial index (ABI). During this test an ultrasound and blood pressure cuff are used to evaluate the arteries that supply the arms and legs with blood. Allow thirty minutes for this exam. There are no restrictions or special instructions.   Wear monitor for a total of 30 days--until April 02, 2011.  Place in box that came with monitor and take to post office to be sent in.

## 2011-03-16 NOTE — Assessment & Plan Note (Signed)
She has PACs on the initial strips from her monitor. Will have her finish the monitor and will review full report. I have reassured her today.

## 2011-03-16 NOTE — Progress Notes (Signed)
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 She was referred in May of 2010 for cardiology risk assessment prior to planned thoracotomy which she had in June 2010. Her echo prior to the surgery showed normal LV function with mild diastolic dysfunction and mild AI.  She was seen in the ED at [facility] October 28, 2010 for left flank pain. It was felt to be musculoskeletal. Cardiac enzymes were normal. I last saw her in October 2012. She has had cervical spine surgery since then. Since then she has seen by Norma Fredrickson, NP on 03/02/11 for c/o palpitations. An event monitor was placed and she is still wearing this.   She tells me today that her palpitations have resolved. No chest pain, SOB. She does have pain in both legs and in her feet when sitting and worsened when walking. She does not have previous PAD documented.   Past Medical History  Diagnosis Date  . Cancer     lung ca  . 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 HYPERLIPIDEMIA 11/18/2006  . HYPERKALEMIA 02/05/2008  . ANEMIA-IRON DEFICIENCY 09/22/2008  . Anemia of other chronic disease 02/05/2008  . ANXIETY 06/03/2008  . DEPRESSION 04/28/2009  . HYPERTENSION 11/18/2006  . PERICARDITIS 01/03/2007  . AORTIC STENOSIS/ INSUFFICIENCY, NON-RHEUMATIC 12/03/2008  . SINUSITIS- ACUTE-NOS 11/20/2007  . SINUSITIS, CHRONIC 02/05/2008  . ALLERGIC RHINITIS 11/18/2006  . PNEUMONIA 05/02/2007  . C O P D 06/27/2008  . Stricture and stenosis of esophagus 11/14/2008  . GERD 01/03/2007  . BARRETTS ESOPHAGUS 11/14/2008  . RENAL INSUFFICIENCY 02/05/2008  . DYSPEPSIA 03/23/2007  . PRURITUS 10/08/2008  . OSTEOARTHRITIS 11/18/2006  . OSTEOARTHRITIS, KNEE, LEFT 01/03/2007  . Cervicalgia 01/13/2009  . SPONDYLOSIS, CERVICAL, WITH RADICULOPATHY 01/13/2009  . BACK PAIN 02/04/2009  .  BURSITIS, LEFT HIP 04/18/2009  . OSTEOPOROSIS 11/18/2006  . INSOMNIA-SLEEP DISORDER-UNSPEC 06/03/2008  . FATIGUE 04/18/2009  . PERIPHERAL EDEMA 02/13/2009  . MURMUR 11/18/2006  . DYSPHAGIA UNSPECIFIED 03/23/2007  . Abdominal pain, generalized 03/01/2007  . Nonspecific (abnormal) findings on radiological and other examination of body structure 06/03/2008  . Tuberculin Test Reaction 11/18/2006  . Acute bronchitis 02/23/2010  . CHOLELITHIASIS 04/24/2010  . NEPHROLITHIASIS, HX OF 04/24/2010  . Polyarthralgia 06/09/2010  . Elevated sed rate 06/11/2010  . Positive ANA (antinuclear antibody) 06/11/2010  . Rheumatoid factor positive 06/11/2010  . Cervical radiculopathy 09/28/2010  . Lumbar radiculopathy 09/28/2010    Past Surgical History  Procedure Date  . Abdominal hysterectomy   . Tubal ligation   . Tumor removal 08/06/08    from lungs  . Anterior cervical decomp/discectomy fusion 01/15/2011    Procedure: ANTERIOR CERVICAL DECOMPRESSION/DISCECTOMY FUSION 2 LEVELS;  Surgeon: Kathaleen Maser Pool;  Location: [facility];  Service: Neurosurgery;  Laterality: N/A;  Cervical Three-Four, Cervical Four-Five Anterior Cervical Decompression Fusion     Current Outpatient Prescriptions  Medication Sig Dispense Refill  . albuterol (PROVENTIL HFA) 108 (90 BASE) MCG/ACT inhaler Inhale 1-2 puffs into the lungs 4 (four) times daily as needed. For wheezing or shortness of breath.  1 Inhaler  5  . alendronate (FOSAMAX) 70 MG tablet Take 70 mg by mouth every 7 (seven) days. Take with a full glass of water on an empty stomach. Patient takes on Mondays.       . ALPRAZolam (XANAX) 0.5 MG tablet Take 0.5  mg by mouth 2 (two) times daily as needed.        . Cholecalciferol (VITAMIN D3) 1000 UNITS CAPS Take 1 capsule by mouth daily.       . furosemide (LASIX) 40 MG tablet Take 40 mg by mouth daily.        Marland Kitchen gabapentin (NEURONTIN) 100 MG capsule Take 100 mg by mouth 3 (three) times daily.      Marland Kitchen omeprazole (PRILOSEC) 20 MG capsule Take 40  mg by mouth daily.        Marland Kitchen oxyCODONE-acetaminophen (PERCOCET) 7.5-325 MG per tablet Take 1 tablet by mouth every 6 (six) hours as needed for pain. For pain.  60 tablet  0  . pravastatin (PRAVACHOL) 40 MG tablet Take 40 mg by mouth daily.        Marland Kitchen zolpidem (AMBIEN) 5 MG tablet Take 5 mg by mouth at bedtime as needed. For sleep.       Marland Kitchen gabapentin (NEURONTIN) 100 MG capsule Take 100 mg by mouth daily.        Marland Kitchen DISCONTD: oxyCODONE-acetaminophen (PERCOCET) 7.5-325 MG per tablet Take 1 tablet by mouth every 6 (six) hours as needed for pain.  60 tablet  0  . DISCONTD: zolpidem (AMBIEN) 5 MG tablet Take 1 tablet (5 mg total) by mouth at bedtime as needed for sleep.  30 tablet  5    Allergies  Allergen Reactions  . Amoxicillin     REACTION: unknown reaction  . Codeine     REACTION: rash/hives  . Fentanyl     REACTION: hallucinations  . Hydrocodone     REACTION: ineffective  . Penicillins     REACTION: rash/hives    History   Social History  . Marital Status: Widowed    Spouse Name: N/A    Number of Children: N/A  . Years of Education: N/A   Occupational History  . Not on file.   Social History Main Topics  . Smoking status: Former Smoker -- 1.5 packs/day for 40 years    Types: Cigarettes    Quit date: 02/15/2006  . Smokeless tobacco: Former Neurosurgeon  . Alcohol Use: No  . Drug Use: No  . Sexually Active: Yes    Birth Control/ Protection: Post-menopausal   Other Topics Concern  . Not on file   Social History Narrative  . No narrative on file    Family History  Problem Relation Age of Onset  . Hyperlipidemia Other   . Hypertension Other   . Coronary artery disease Other     several nephrew    Review of Systems:  As stated in the HPI and otherwise negative.   BP 150/61  Pulse 78  Ht 5' (1.524 m)  Wt 168 lb (76.204 kg)  BMI 32.81 kg/m2  Physical Examination: General: Well developed, well nourished, NAD HEENT: OP clear, mucus membranes moist SKIN: warm, dry. No  rashes. Neuro: No focal deficits Musculoskeletal: Muscle strength 5/5 all ext Psychiatric: Mood and affect normal Neck: No JVD, no carotid bruits, no thyromegaly, no lymphadenopathy. Lungs:Clear bilaterally, no wheezes, rhonci, crackles Cardiovascular: Regular rate and rhythm. No murmurs, gallops or rubs. Abdomen:Soft. Bowel sounds present. Non-tender.  Extremities: No lower extremity edema. Pulses are 2 + in the bilateral DP/PT.  EKG:  Echo 11/26/10:  Left ventricle: The cavity size was normal. Wall thickness was normal. Systolic function was normal. The estimated ejection fraction was in the range of 55% to 65%. Wall motion was normal; there were no regional  wall motion abnormalities. Left ventricular diastolic function parameters were normal. - Aortic valve: Mild regurgitation. - Mitral valve: Moderate regurgitation. - Left atrium: The atrium was mildly dilated. - Pulmonary arteries: Systolic pressure was mildly to moderately increased. PA peak pressure: 41mm Hg (S).

## 2011-03-24 ENCOUNTER — Other Ambulatory Visit: Payer: Self-pay

## 2011-03-24 MED ORDER — ZOLPIDEM TARTRATE 5 MG PO TABS: 5.0000 mg | ORAL_TABLET | Freq: Every evening | ORAL | Status: DC | PRN

## 2011-03-24 NOTE — Telephone Encounter (Signed)
Done hardcopy to robin  

## 2011-03-25 ENCOUNTER — Encounter (INDEPENDENT_AMBULATORY_CARE_PROVIDER_SITE_OTHER): Payer: Medicare Other | Admitting: *Deleted

## 2011-03-25 ENCOUNTER — Telehealth: Payer: Self-pay | Admitting: Cardiovascular Disease

## 2011-03-25 DIAGNOSIS — R002 Palpitations: Secondary | ICD-10-CM

## 2011-03-25 DIAGNOSIS — I739 Peripheral vascular disease, unspecified: Secondary | ICD-10-CM

## 2011-03-25 DIAGNOSIS — M79606 Pain in leg, unspecified: Secondary | ICD-10-CM

## 2011-03-25 MED ORDER — METOPROLOL SUCCINATE ER 25 MG PO TB24: 25.0000 mg | ORAL_TABLET | Freq: Every day | ORAL | Status: DC

## 2011-03-25 NOTE — Telephone Encounter (Signed)
Her event monitor shows a short run of NSVT (9 beats). We should start low dose Toprol XL 25 mg po Qdaily. Can we let her know? Thanks, chris

## 2011-03-25 NOTE — Telephone Encounter (Signed)
Spoke with pt and gave her information about monitor and instructions from Dr. Clifton James to start Toprol.  Will send to [facility] on [address] per her request.

## 2011-03-25 NOTE — Telephone Encounter (Signed)
Addended by: Dossie Arbour on: 03/25/2011 05:42 PM   Modules accepted: Orders

## 2011-03-25 NOTE — Telephone Encounter (Signed)
Faxed hardcopy to pharmacy. 

## 2011-03-27 ENCOUNTER — Telehealth: Payer: Self-pay | Admitting: Internal Medicine

## 2011-03-27 NOTE — Telephone Encounter (Signed)
Talked to pt, gave her appt on 09/20/11 lab and CT. Pt will see md 09/21/11

## 2011-03-31 ENCOUNTER — Encounter: Payer: Self-pay | Admitting: Gastroenterology

## 2011-04-02 ENCOUNTER — Encounter: Payer: Self-pay | Admitting: Internal Medicine

## 2011-04-02 ENCOUNTER — Other Ambulatory Visit (INDEPENDENT_AMBULATORY_CARE_PROVIDER_SITE_OTHER): Payer: Medicare Other

## 2011-04-02 DIAGNOSIS — D509 Iron deficiency anemia, unspecified: Secondary | ICD-10-CM

## 2011-04-02 DIAGNOSIS — N259 Disorder resulting from impaired renal tubular function, unspecified: Secondary | ICD-10-CM

## 2011-04-02 DIAGNOSIS — E785 Hyperlipidemia, unspecified: Secondary | ICD-10-CM

## 2011-04-02 DIAGNOSIS — D638 Anemia in other chronic diseases classified elsewhere: Secondary | ICD-10-CM

## 2011-04-02 LAB — BASIC METABOLIC PANEL
BUN: 23 mg/dL (ref 6–23)
Chloride: 103 mEq/L (ref 96–112)
Creatinine, Ser: 1.6 mg/dL — ABNORMAL HIGH (ref 0.4–1.2)
Glucose, Bld: 107 mg/dL — ABNORMAL HIGH (ref 70–99)
Potassium: 4.3 mEq/L (ref 3.5–5.1)

## 2011-04-02 LAB — CBC WITH DIFFERENTIAL/PLATELET
Basophils Absolute: 0 10*3/uL (ref 0.0–0.1)
Eosinophils Absolute: 0.1 10*3/uL (ref 0.0–0.7)
Eosinophils Relative: 2.9 % (ref 0.0–5.0)
HCT: 33.4 % — ABNORMAL LOW (ref 36.0–46.0)
Lymphs Abs: 0.9 10*3/uL (ref 0.7–4.0)
MCV: 102.2 fl — ABNORMAL HIGH (ref 78.0–100.0)
Monocytes Absolute: 0.5 10*3/uL (ref 0.1–1.0)
Neutrophils Relative %: 62 % (ref 43.0–77.0)
Platelets: 177 10*3/uL (ref 150.0–400.0)
RDW: 13.5 % (ref 11.5–14.6)
WBC: 4.2 10*3/uL — ABNORMAL LOW (ref 4.5–10.5)

## 2011-04-02 LAB — LIPID PANEL
Cholesterol: 121 mg/dL (ref 0–200)
Triglycerides: 102 mg/dL (ref 0.0–149.0)

## 2011-04-02 LAB — IBC PANEL
Saturation Ratios: 21.7 % (ref 20.0–50.0)
Transferrin: 227.3 mg/dL (ref 212.0–360.0)

## 2011-04-13 ENCOUNTER — Telehealth: Payer: Self-pay | Admitting: *Deleted

## 2011-04-13 NOTE — Telephone Encounter (Signed)
Called pt to review final monitor results. Left message on home phone to call back. No answer on cell phone

## 2011-04-14 ENCOUNTER — Ambulatory Visit (AMBULATORY_SURGERY_CENTER): Payer: Medicare Other | Admitting: *Deleted

## 2011-04-14 VITALS — Ht 61.0 in | Wt 159.8 lb

## 2011-04-14 DIAGNOSIS — Z1211 Encounter for screening for malignant neoplasm of colon: Secondary | ICD-10-CM

## 2011-04-14 MED ORDER — MOVIPREP 100 G PO SOLR: ORAL | Status: DC

## 2011-04-14 NOTE — Telephone Encounter (Signed)
Pt rtn call to pat, pt requesting call back today, pls call [phone]/mt

## 2011-04-14 NOTE — Telephone Encounter (Signed)
Called pt and informed her that nurse will call tomorrow with results, pt was agreeable to plan. Call at [phone]/ daughters house.

## 2011-04-15 NOTE — Telephone Encounter (Signed)
Spoke with pt and reviewed monitor results with her.  

## 2011-04-19 ENCOUNTER — Other Ambulatory Visit: Payer: Self-pay

## 2011-04-19 IMAGING — CT NM PET TUM IMG INITIAL (PI) SKULL BASE T - THIGH
6 series · 25 of 25 positions shown · IV contrast (350 OM)
Comparison: Chest CT of 06/06/2008

CLINICAL DATA: Initial treatment strategy for left upper lobe lung
cancer.  Biopsy demonstrating a squamous cell carcinoma.

NUCLEAR MEDICINE PET CT SKULL BASE TO THIGH
TECHNIQUE: 15.6 mCi F-18 FDG was injected intravenously via the
right forearm.  Full-ring PET imaging was performed from the skull
base through the mid-thighs 64  minutes after injection.  CT data
was obtained and used for attenuation correction and anatomic
localization only.  (This was not acquired as a diagnostic CT
examination.)
Fasting Blood Glucose:  72

[Series 1: pet ac · axial · 3.3mm · 4.69mm/px · z∈[-733,-7]mm · 5 of 223 slices shown]
[im 1/223]
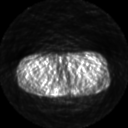
[im 56/223]
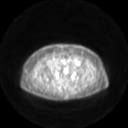
[im 112/223]
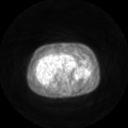
[im 167/223]
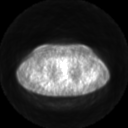
[im 223/223]
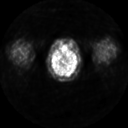

[Series 2: pet nac · axial · 3.3mm · 4.69mm/px · z∈[-733,-7]mm · 5 of 223 slices shown]
[im 1/223]
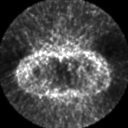
[im 56/223]
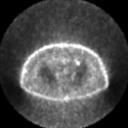
[im 112/223]
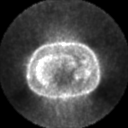
[im 167/223]
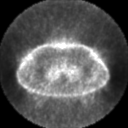
[im 223/223]
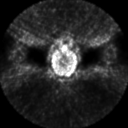

[Series 2: ct images · axial · 3.8mm · 0.98mm/px · z∈[-730,-8]mm · 6 of 221 slices shown]
[im 1/221]
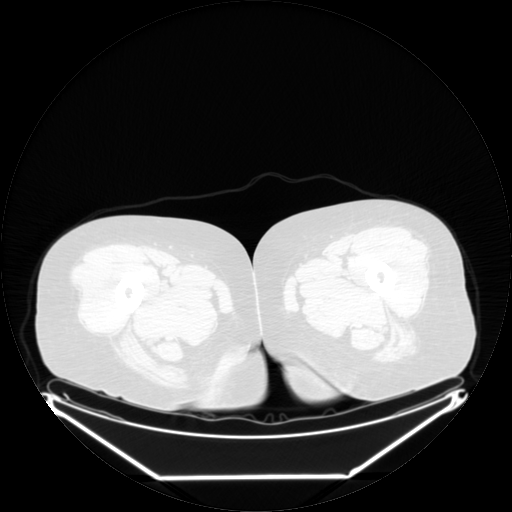
[im 45/221]
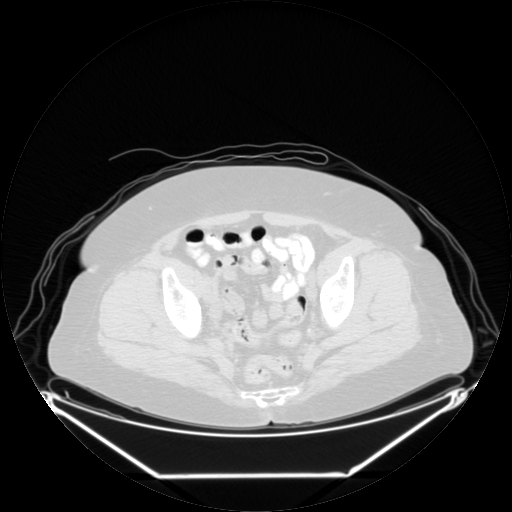
[im 89/221]
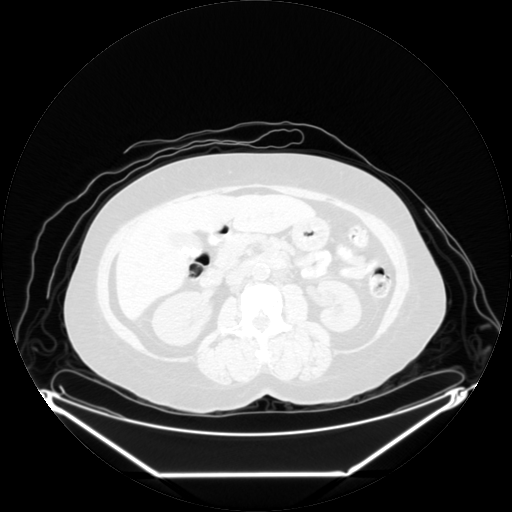
[im 133/221]
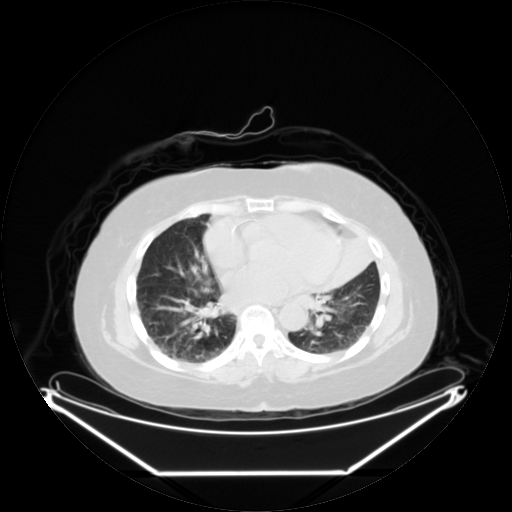
[im 177/221]
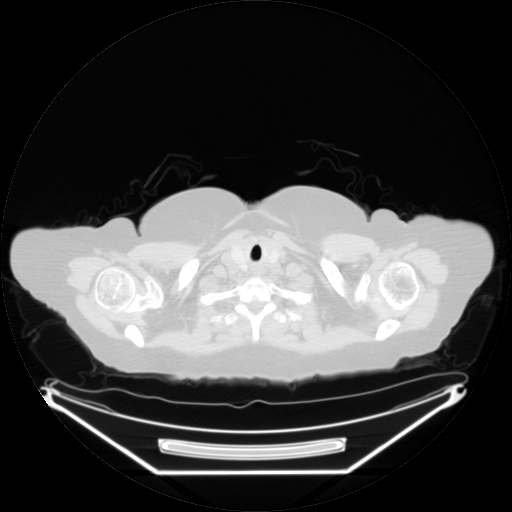
[im 221/221  brain]
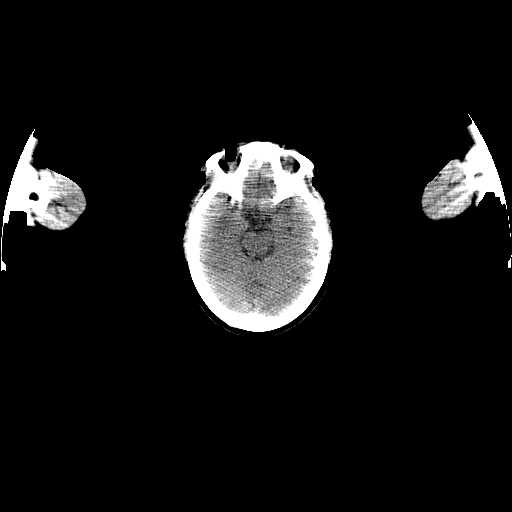

[Series 123: mip · coronal · 3.3mm · 4.69mm/px · 1 of 30 slices shown]
[im 1/30]
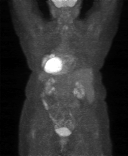

[Series 151: reformatted · axial · 3.3mm · 3.91mm/px · z∈[-733,-7]mm · 6 of 223 slices shown (1 of 2)]
[im 1/223]
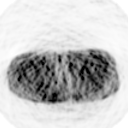
[im 45/223]
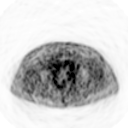
[im 89/223]
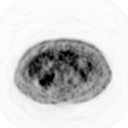
[im 134/223]
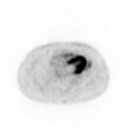
[im 178/223]
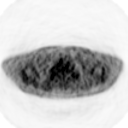
[im 223/223]
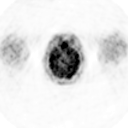

[Series 153: reformatted · coronal · 4.7mm · 5.83mm/px · 2 of 69 slices shown (2 of 2)]
[im 1/69]
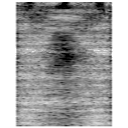
[im 69/69]
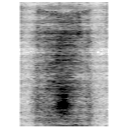

[25 of 25 positions shown; findings below may reference images not displayed]

FINDINGS: PET images demonstrate likely physiologic activity about
the right piriform sinus without correlate CT abnormality.

Hypermetabolism most consistent with a central mass, centered about
the lingular bronchus.  Superiorly, the central hypermetabolism
measures a S.U.V. max of 12.0 on approximately image 82.   The
extent of the central tumor is difficult to delineate due to the
surrounding airspace disease.  On the CT, suggestion of
hypoattenuation measuring approximately 2.3 x 2.2 cm on image 28.
Within the more peripheral lingula, moderate hypermetabolism at a
S.U.V. max of 8.3.

No abnormal activity within the abdomen or pelvis.

CT images performed for attenuation correction demonstrate no
significant findings in the neck.  Chest findings will be deferred
to recent diagnostic CT.  The mediastinal lymph nodes described at
the diagnostic CT are not hypermetabolic. Multivessel coronary
artery atherosclerosis.  Normal adrenal glands.  Gallstones without
acute cholecystitis.  Left nonobstructive renal calculus.
IMPRESSION: 1.  Hypermetabolism surrounding the lingular bronchus most
consistent with a central obstructive lesion.  Surrounding lingular
opacity with hypermetabolism may represent infiltrative tumor
and/or surrounding postobstructive pneumonitis.
2.  No evidence of thoracic nodal metastasis or extrathoracic
disease.

## 2011-04-19 MED ORDER — FUROSEMIDE 40 MG PO TABS: 40.0000 mg | ORAL_TABLET | Freq: Every day | ORAL | Status: DC

## 2011-04-19 MED ORDER — PRAVASTATIN SODIUM 40 MG PO TABS: 40.0000 mg | ORAL_TABLET | Freq: Every day | ORAL | Status: DC

## 2011-04-28 ENCOUNTER — Ambulatory Visit (AMBULATORY_SURGERY_CENTER): Payer: Medicare Other | Admitting: Gastroenterology

## 2011-04-28 ENCOUNTER — Encounter: Payer: Self-pay | Admitting: Gastroenterology

## 2011-04-28 VITALS — BP 151/79 | HR 72 | Temp 96.8°F | Resp 24 | Ht 61.0 in | Wt 159.0 lb

## 2011-04-28 DIAGNOSIS — Z1211 Encounter for screening for malignant neoplasm of colon: Secondary | ICD-10-CM

## 2011-04-28 DIAGNOSIS — K5730 Diverticulosis of large intestine without perforation or abscess without bleeding: Secondary | ICD-10-CM

## 2011-04-28 MED ORDER — SODIUM CHLORIDE 0.9 % IV SOLN: 500.0000 mL | INTRAVENOUS | Status: DC

## 2011-04-28 NOTE — Progress Notes (Signed)
Patient did not experience any of the following events: a burn prior to discharge; a fall within the facility; wrong site/side/patient/procedure/implant event; or a hospital transfer or hospital admission upon discharge from the facility. (G8907) Patient did not have preoperative order for IV antibiotic SSI prophylaxis. (G8918)  

## 2011-04-28 NOTE — Progress Notes (Signed)
Propofol was administered by [facility] CRNA. Maw  The pt tolerated the colonoscopy very well. Maw  11:46 Shon Hough, CRNA hung second bag of normal saline 0.9% 500 ml. Maw

## 2011-04-28 NOTE — Op Note (Signed)
[facility] [address] [place], [place]  [address]  COLONOSCOPY PROCEDURE REPORT  PATIENT:  Jody, [name]  MR#:  [id number] BIRTHDATE:  22-Nov-1940, 70 yrs. old  GENDER:  female ENDOSCOPIST:  Barbette Hair. Arlyce Dice, MD REF. BY:  [name], M.D. PROCEDURE DATE:  04/28/2011 PROCEDURE:  Diagnostic Colonoscopy ASA CLASS:  Class II INDICATIONS:  Routine Risk Screening MEDICATIONS:   MAC sedation, administered by CRNA propofol 110mg IV  DESCRIPTION OF PROCEDURE:   After the risks benefits and alternatives of the procedure were thoroughly explained, informed consent was obtained.  Digital rectal exam was performed and revealed no abnormalities.   The [id number] endoscope was introduced through the anus and advanced to the cecum, which was identified by both the appendix and ileocecal valve, without limitations.  The quality of the prep was good, using MoviPrep. The instrument was then slowly withdrawn as the colon was fully examined. <<PROCEDUREIMAGES>>  FINDINGS:  Moderate diverticulosis was found (see image1). sigmoid to ascending colon  A nodule was found in the rectum (see image5). 3mm nodule below dentate line  This was otherwise a normal examination of the colon (see image2).   Retroflexed views in the rectum revealed no other findings other than those already described.    The time to cecum =  1) 4.75  minutes. The scope was then withdrawn in  1) 6.0  minutes from the cecum and the procedure completed. COMPLICATIONS:  None ENDOSCOPIC IMPRESSION: 1) Moderate diverticulosis 2) Nodule in the rectum (benign rectal nodule) 3) Otherwise normal examination RECOMMENDATIONS: 1) Colonoscopy REPEAT EXAM:  In 10 year(s) for Colonoscopy.  ______________________________ Barbette Hair. Arlyce Dice, MD  CC:  n. eSIGNED:   Barbette Hair. Daisee Centner at 04/28/2011 11:52 AM  Rueben Bash, [id number]

## 2011-04-28 NOTE — Patient Instructions (Signed)
YOU HAD AN ENDOSCOPIC PROCEDURE TODAY AT THE [facility]: Refer to the procedure report that was given to you for any specific questions about what was found during the examination.  If the procedure report does not answer your questions, please call your gastroenterologist to clarify.  If you requested that your care partner not be given the details of your procedure findings, then the procedure report has been included in a sealed envelope for you to review at your convenience later.  YOU SHOULD EXPECT: Some feelings of bloating in the abdomen. Passage of more gas than usual.  Walking can help get rid of the air that was put into your GI tract during the procedure and reduce the bloating. If you had a lower endoscopy (such as a colonoscopy or flexible sigmoidoscopy) you may notice spotting of blood in your stool or on the toilet paper. If you underwent a bowel prep for your procedure, then you may not have a normal bowel movement for a few days.  DIET: Your first meal following the procedure should be a light meal and then it is ok to progress to your normal diet.  A half-sandwich or bowl of soup is an example of a good first meal.  Heavy or fried foods are harder to digest and may make you feel nauseous or bloated.  Likewise meals heavy in dairy and vegetables can cause extra gas to form and this can also increase the bloating.  Drink plenty of fluids but you should avoid alcoholic beverages for 24 hours.  ACTIVITY: Your care partner should take you home directly after the procedure.  You should plan to take it easy, moving slowly for the rest of the day.  You can resume normal activity the day after the procedure however you should NOT DRIVE or use heavy machinery for 24 hours (because of the sedation medicines used during the test).    SYMPTOMS TO REPORT IMMEDIATELY: A gastroenterologist can be reached at any hour.  During normal business hours, 8:30 AM to 5:00 PM Monday through Friday,  call [phone].  After hours and on weekends, please call the GI answering service at [phone] who will take a message and have the physician on call contact you.   Following lower endoscopy (colonoscopy or flexible sigmoidoscopy):  Excessive amounts of blood in the stool  Significant tenderness or worsening of abdominal pains  Swelling of the abdomen that is new, acute  Fever of 100F or higher    FOLLOW UP: If any biopsies were taken you will be contacted by phone or by letter within the next 1-3 weeks.  Call your gastroenterologist if you have not heard about the biopsies in 3 weeks.  Our staff will call the home number listed on your records the next business day following your procedure to check on you and address any questions or concerns that you may have at that time regarding the information given to you following your procedure. This is a courtesy call and so if there is no answer at the home number and we have not heard from you through the emergency physician on call, we will assume that you have returned to your regular daily activities without incident.  SIGNATURES/CONFIDENTIALITY: You and/or your care partner have signed paperwork which will be entered into your electronic medical record.  These signatures attest to the fact that that the information above on your After Visit Summary has been reviewed and is understood.  Full responsibility of the confidentiality   of this discharge information lies with you and/or your care-partner.     

## 2011-04-29 ENCOUNTER — Telehealth: Payer: Self-pay | Admitting: *Deleted

## 2011-04-29 NOTE — Telephone Encounter (Signed)
  Follow up Call-  Call back number 04/28/2011  Post procedure Call Back phone  # [phone]  Permission to leave phone message Yes     Patient questions:  Do you have a fever, pain , or abdominal swelling? no Pain Score  0 *  Have you tolerated food without any problems? yes  Have you been able to return to your normal activities? yes  Do you have any questions about your discharge instructions: Diet   no Medications  no Follow up visit  no  Do you have questions or concerns about your Care? no  Actions: * If pain score is 4 or above: No action needed, pain <4.  Pt not available.  Spoke with pts daughter Terlton.

## 2011-05-06 ENCOUNTER — Ambulatory Visit (INDEPENDENT_AMBULATORY_CARE_PROVIDER_SITE_OTHER): Payer: Medicare Other | Admitting: Internal Medicine

## 2011-05-06 ENCOUNTER — Other Ambulatory Visit (INDEPENDENT_AMBULATORY_CARE_PROVIDER_SITE_OTHER): Payer: Medicare Other

## 2011-05-06 ENCOUNTER — Encounter: Payer: Self-pay | Admitting: Internal Medicine

## 2011-05-06 VITALS — BP 138/60 | HR 85 | Temp 98.3°F | Ht 62.0 in | Wt 170.5 lb

## 2011-05-06 DIAGNOSIS — Z0000 Encounter for general adult medical examination without abnormal findings: Secondary | ICD-10-CM

## 2011-05-06 DIAGNOSIS — R1030 Lower abdominal pain, unspecified: Secondary | ICD-10-CM

## 2011-05-06 DIAGNOSIS — H9190 Unspecified hearing loss, unspecified ear: Secondary | ICD-10-CM

## 2011-05-06 DIAGNOSIS — J019 Acute sinusitis, unspecified: Secondary | ICD-10-CM

## 2011-05-06 DIAGNOSIS — R109 Unspecified abdominal pain: Secondary | ICD-10-CM

## 2011-05-06 DIAGNOSIS — H9192 Unspecified hearing loss, left ear: Secondary | ICD-10-CM

## 2011-05-06 DIAGNOSIS — M25519 Pain in unspecified shoulder: Secondary | ICD-10-CM

## 2011-05-06 DIAGNOSIS — M25512 Pain in left shoulder: Secondary | ICD-10-CM

## 2011-05-06 LAB — URINALYSIS, ROUTINE W REFLEX MICROSCOPIC
Ketones, ur: NEGATIVE
Specific Gravity, Urine: 1.01 (ref 1.000–1.030)
Total Protein, Urine: NEGATIVE
Urine Glucose: NEGATIVE
pH: 5.5 (ref 5.0–8.0)

## 2011-05-06 MED ORDER — LEVOFLOXACIN 250 MG PO TABS: 250.0000 mg | ORAL_TABLET | Freq: Every day | ORAL | Status: AC

## 2011-05-06 NOTE — Patient Instructions (Signed)
Take all new medications as prescribed - the antibiotic Please go to LAB in the Basement for the urine tests to be done today You will be contacted by phone if any changes need to be made immediately.  Otherwise, you will receive a letter about your results with an explanation. Please keep your appointments with your specialists as you have planned - Dr Dutch Quint Please use peroxide and plastic bulb syringe to irrigate your left ear of wax at home Please call if you change your mind about the orthopedic referral about the left shoulder Continue all other medications as before Please have the pharmacy call with any refills you may need. Please return in 6 mo with Lab testing done 3-5 days before, or sooner if needed

## 2011-05-06 NOTE — Assessment & Plan Note (Signed)
With wax impaction,pt declines irrigation today

## 2011-05-06 NOTE — Assessment & Plan Note (Signed)
Benign exam, and with recent mild dysuria, for urine studies, antibx as per sinusitis today as well

## 2011-05-06 NOTE — Assessment & Plan Note (Signed)
C/w bursitis, declines refer to ortho (afraid to go, after recent surgury per Dr Dutch Quint for spine)

## 2011-05-08 LAB — URINE CULTURE: Colony Count: 25000

## 2011-05-09 ENCOUNTER — Encounter: Payer: Self-pay | Admitting: Internal Medicine

## 2011-05-09 NOTE — Assessment & Plan Note (Signed)
Mild to mod, for antibx course,  to f/u any worsening symptoms or concerns 

## 2011-05-09 NOTE — Progress Notes (Signed)
Subjective:    Patient ID: [name], female    DOB: 04-08-1940, 71 y.o.   MRN: [id number]  HPI   Here with 3 days acute onset fever, facial pain, pressure, general weakness and malaise, and greenish d/c, with slight ST, but little to no cough and Pt denies chest pain, increased sob or doe, wheezing, orthopnea, PND, increased LE swelling, palpitations, dizziness or syncope. Also has lower abd pain and pressure witg ? mild dysuria, but  Denies frequency, urgency,or hematuria.  Also with left shoulder pain, mild to mod, for several wks, worse to abduct and laying on the left side, better with nothing.  No neck or radicular pain. Pt denies new neurological symptoms such as new headache, or facial or extremity weakness or numbness   Pt denies polydipsia, polyuria.  Also with 1 wk left hearing loss - ? wax Past Medical History  Diagnosis Date  . Cancer     lung ca  . 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 HYPERLIPIDEMIA 11/18/2006  . HYPERKALEMIA 02/05/2008  . ANEMIA-IRON DEFICIENCY 09/22/2008  . Anemia of other chronic disease 02/05/2008  . ANXIETY 06/03/2008  . DEPRESSION 04/28/2009  . HYPERTENSION 11/18/2006  . PERICARDITIS 01/03/2007  . AORTIC STENOSIS/ INSUFFICIENCY, NON-RHEUMATIC 12/03/2008  . SINUSITIS- ACUTE-NOS 11/20/2007  . SINUSITIS, CHRONIC 02/05/2008  . ALLERGIC RHINITIS 11/18/2006  . PNEUMONIA 05/02/2007  . C O P D 06/27/2008  . Stricture and stenosis of esophagus 11/14/2008  . GERD 01/03/2007  . BARRETTS ESOPHAGUS 11/14/2008  . RENAL INSUFFICIENCY 02/05/2008  . DYSPEPSIA 03/23/2007  . PRURITUS 10/08/2008  . OSTEOARTHRITIS 11/18/2006  . OSTEOARTHRITIS, KNEE, LEFT 01/03/2007  . Cervicalgia 01/13/2009  . SPONDYLOSIS, CERVICAL, WITH RADICULOPATHY 01/13/2009  . BACK PAIN 02/04/2009  . BURSITIS, LEFT HIP 04/18/2009  . OSTEOPOROSIS 11/18/2006  . INSOMNIA-SLEEP DISORDER-UNSPEC 06/03/2008  . FATIGUE 04/18/2009  . PERIPHERAL EDEMA 02/13/2009  . MURMUR 11/18/2006  . DYSPHAGIA UNSPECIFIED  03/23/2007  . Abdominal pain, generalized 03/01/2007  . Nonspecific (abnormal) findings on radiological and other examination of body structure 06/03/2008  . Tuberculin Test Reaction 11/18/2006  . Acute bronchitis 02/23/2010  . CHOLELITHIASIS 04/24/2010  . NEPHROLITHIASIS, HX OF 04/24/2010  . Polyarthralgia 06/09/2010  . Elevated sed rate 06/11/2010  . Positive ANA (antinuclear antibody) 06/11/2010  . Rheumatoid factor positive 06/11/2010  . Cervical radiculopathy 09/28/2010  . Lumbar radiculopathy 09/28/2010   Past Surgical History  Procedure Date  . Abdominal hysterectomy   . Tubal ligation   . Tumor removal 08/06/08    from lungs  . Anterior cervical decomp/discectomy fusion 01/15/2011    Procedure: ANTERIOR CERVICAL DECOMPRESSION/DISCECTOMY FUSION 2 LEVELS;  Surgeon: Kathaleen Maser Pool;  Location: [facility];  Service: Neurosurgery;  Laterality: N/A;  Cervical Three-Four, Cervical Four-Five Anterior Cervical Decompression Fusion   . Colonscopy     reports that she quit smoking about 5 years ago. Her smoking use included Cigarettes. She has a 60 pack-year smoking history. She has quit using smokeless tobacco. She reports that she does not drink alcohol or use illicit drugs. family history includes Coronary artery disease in her other; Heart disease in her father; Hyperlipidemia in her other; and Hypertension in her other.  There is no history of Colon cancer. Allergies  Allergen Reactions  . Amoxicillin     REACTION: unknown reaction  . Codeine     REACTION: rash/hives  . Fentanyl     REACTION: hallucinations  . Hydrocodone     REACTION: ineffective  . Penicillins  REACTION: rash/hives   Current Outpatient Prescriptions on File Prior to Visit  Medication Sig Dispense Refill  . albuterol (PROVENTIL HFA) 108 (90 BASE) MCG/ACT inhaler Inhale 1-2 puffs into the lungs 4 (four) times daily as needed. For wheezing or shortness of breath.  1 Inhaler  5  . alendronate (FOSAMAX) 70 MG tablet Take 70  mg by mouth every 7 (seven) days. Take with a full glass of water on an empty stomach. Patient takes on Mondays.       . ALPRAZolam (XANAX) 0.5 MG tablet Take 0.5 mg by mouth 2 (two) times daily as needed.        . Cholecalciferol (VITAMIN D3) 1000 UNITS CAPS Take 1 capsule by mouth daily.       . furosemide (LASIX) 40 MG tablet Take 1 tablet (40 mg total) by mouth daily.  90 tablet  3  . gabapentin (NEURONTIN) 100 MG capsule Take 100 mg by mouth 3 (three) times daily.      . metoprolol succinate (TOPROL XL) 25 MG 24 hr tablet Take 1 tablet (25 mg total) by mouth daily.  30 tablet  11  . omeprazole (PRILOSEC) 20 MG capsule Take 40 mg by mouth daily.        Marland Kitchen oxyCODONE-acetaminophen (PERCOCET) 7.5-325 MG per tablet Take 1 tablet by mouth every 6 (six) hours as needed for pain. For pain.  60 tablet  0  . pravastatin (PRAVACHOL) 40 MG tablet Take 1 tablet (40 mg total) by mouth daily.  90 tablet  3  . zolpidem (AMBIEN) 5 MG tablet Take 1 tablet (5 mg total) by mouth at bedtime as needed. For sleep.  30 tablet  5  . gabapentin (NEURONTIN) 100 MG capsule Take 100 mg by mouth daily.        Marland Kitchen DISCONTD: oxyCODONE-acetaminophen (PERCOCET) 7.5-325 MG per tablet Take 1 tablet by mouth every 6 (six) hours as needed for pain.  60 tablet  0  . DISCONTD: zolpidem (AMBIEN) 5 MG tablet Take 1 tablet (5 mg total) by mouth at bedtime as needed for sleep.  30 tablet  5   Review of Systems Review of Systems  Constitutional: Negative for diaphoresis and unexpected weight change.  HENT: Negative for drooling and tinnitus.   Eyes: Negative for photophobia and visual disturbance.  Respiratory: Negative for choking and stridor.   Gastrointestinal: Negative for vomiting and blood in stool.  Genitourinary: Negative for hematuria and decreased urine volume.  Musculoskeletal: Negative for gait problem.  Skin: Negative for color change and wound.    Objective:   Physical Exam BP 138/60  Pulse 85  Temp(Src) 98.3 F  (36.8 C) (Oral)  Ht 5\' 2"  (1.575 m)  Wt 170 lb 8 oz (77.338 kg)  BMI 31.18 kg/m2  SpO2 90% Physical Exam  VS noted Constitutional: Pt appears well-developed and well-nourished.  HENT: Head: Normocephalic.  Right Ear: External ear normal.  Left Ear: External ear normal. left canal clear after wax impaction removed, hearing improved Bilat tm's mild erythema.  Sinus tender bilat.  Pharynx mild erythema Eyes: Conjunctivae and EOM are normal. Pupils are equal, round, and reactive to light.  Neck: Normal range of motion. Neck supple.  Cardiovascular: Normal rate and regular rhythm.   Pulmonary/Chest: Effort normal and breath sounds normal.  Abd:  Soft, NT, non-distended, + BS, no guarding or rebound, no flank tender Neurological: Pt is alert. No cranial nerve deficit.  Skin: Skin is warm. No erythema.  Psychiatric: Pt behavior is normal.  Thought content normal.  Left shoulder with tender left bursa, worse with abduction to 90 degrees    Assessment & Plan:

## 2011-05-11 ENCOUNTER — Ambulatory Visit (INDEPENDENT_AMBULATORY_CARE_PROVIDER_SITE_OTHER): Payer: Medicare Other | Admitting: Adult Health

## 2011-05-11 ENCOUNTER — Encounter: Payer: Self-pay | Admitting: Adult Health

## 2011-05-11 VITALS — BP 144/68 | HR 73 | Temp 97.1°F | Ht 61.0 in | Wt 166.8 lb

## 2011-05-11 DIAGNOSIS — J449 Chronic obstructive pulmonary disease, unspecified: Secondary | ICD-10-CM

## 2011-05-11 DIAGNOSIS — J4489 Other specified chronic obstructive pulmonary disease: Secondary | ICD-10-CM

## 2011-05-11 MED ORDER — LEVALBUTEROL HCL 0.63 MG/3ML IN NEBU
0.6300 mg | INHALATION_SOLUTION | Freq: Once | RESPIRATORY_TRACT | Status: AC
  Administered 2011-05-11: 0.63 mg via RESPIRATORY_TRACT

## 2011-05-11 MED ORDER — LEVOFLOXACIN 500 MG PO TABS: 500.0000 mg | ORAL_TABLET | Freq: Every day | ORAL | Status: AC

## 2011-05-11 NOTE — Patient Instructions (Signed)
Extend Levaquin for 5 days - tomorrow take 2 Levaquin 250mg  tabs then I sent Levaquin 500mg  daily for 5 additional days.  Mucinex DM  Twice daily  As needed  Cough /congestion  Saline nasal rinses As needed   Please contact office for sooner follow up if symptoms do not improve or worsen or seek emergency care  follow up Dr. Vassie Loll  As planned and As needed

## 2011-05-11 NOTE — Progress Notes (Signed)
  Subjective:    Patient ID: [name], female    DOB: 15-Apr-1940, 71 y.o.   MRN: [id number]  Sinus Problem   70/F, ex smoker for FU of COPD  5/10 s/p LUL obectomy for stg I a squamous cell CA  She smoked 2yrs until 2008.  Pre-op FEV1 was 1.53 (80%) & DLCO 48%  CT 03/24/09 no recurrence  Off Spiriva since 2/11 since very expensive.   Jun 30, 2009 1:43 PM  only needed rescue inhaler once.  Rpt PFTs >> FEv1 66%, FVC 63%, DLCO 44%   5 /24/2012 Last seen jan '12  - given z-pak, Ct jan'12 reviewed  Stopped spiriva  The patient complains of shortness of breath, but denies chest tightness, chest pain worse with breathing and coughing, wheezing, cough, mucous production, nocturnal awakening, exercise induced symptoms, and congestion >>no changes   01/11/11  Follow up  Doing well , no change/decline in activity tolerance since stopping Spiriva. NO SABA use since last OV Has upcoming neck surgery this week with Neurosurgeon.  No cough, increased dyspnea or edema, weight loss or hemopytsis  Says she is able to do housework and light activities , currently restricted due to neck pain.  CT chest 09/2010 with stable changes.  Spirometry today >>FEV1>66%, ratio >69 >>  05/11/2011 Acute OV Complains of persistent  congestion and sinus pressure x2-3days. Pt saw PCP ,  tx w/abx  Levaquin.Marland Kitchen  Cough and congestion are not going away. No fever or hemoptysis.  On 2 days of Levaquin 250 left      Review of Systems  Constitutional:   No  weight loss, night sweats,  Fevers, chills, + fatigue, or  lassitude.  HEENT:   No headaches,  Difficulty swallowing,  Tooth/dental problems, or  Sore throat,                No sneezing, itching, ear ache, nasal congestion, post nasal drip,   CV:  No chest pain,  Orthopnea, PND, swelling in lower extremities, anasarca, dizziness, palpitations, syncope.   GI  No heartburn, indigestion, abdominal pain, nausea, vomiting, diarrhea, change in bowel habits, loss of  appetite, bloody stools.   Resp:     No chest wall deformity  Skin: no rash or lesions.  GU: no dysuria, change in color of urine, no urgency or frequency.  No flank pain, no hematuria   MS:  No joint   swelling.  No decreased range of motion.     Psych:  No change in mood or affect. No depression or anxiety.  No memory loss.          Objective:   Physical Exam  Gen. Pleasant, well-nourished, in no distress ENT - no lesions, clear sinus drainage.  Neck: No JVD, no thyromegaly, no carotid bruits Lungs: coarse BS  Cardiovascular: Rhythm regular, heart sounds  normal, no murmurs or gallops, no peripheral edema Musculoskeletal: No deformities, no cyanosis or clubbing          Assessment & Plan:

## 2011-05-13 NOTE — Assessment & Plan Note (Signed)
Flare -slow to resolve   Plan:  Extend Levaquin for 5 days - tomorrow take 2 Levaquin 250mg  tabs then I sent Levaquin 500mg  daily for 5 additional days.  Mucinex DM  Twice daily  As needed  Cough /congestion  Saline nasal rinses As needed   Please contact office for sooner follow up if symptoms do not improve or worsen or seek emergency care  follow up Dr. Vassie Loll  As planned and As needed

## 2011-05-20 ENCOUNTER — Other Ambulatory Visit: Payer: Self-pay | Admitting: Internal Medicine

## 2011-05-20 DIAGNOSIS — Z1231 Encounter for screening mammogram for malignant neoplasm of breast: Secondary | ICD-10-CM

## 2011-05-23 ENCOUNTER — Encounter (HOSPITAL_COMMUNITY): Payer: Self-pay

## 2011-05-23 ENCOUNTER — Emergency Department (HOSPITAL_COMMUNITY)
Admission: EM | Admit: 2011-05-23 | Discharge: 2011-05-23 | Disposition: A | Discharge status: Home / Self Care | Payer: Medicare Other | Attending: Emergency Medicine | Admitting: Emergency Medicine

## 2011-05-23 DIAGNOSIS — R609 Edema, unspecified: Secondary | ICD-10-CM | POA: Insufficient documentation

## 2011-05-23 DIAGNOSIS — L03115 Cellulitis of right lower limb: Secondary | ICD-10-CM

## 2011-05-23 DIAGNOSIS — M7989 Other specified soft tissue disorders: Secondary | ICD-10-CM | POA: Insufficient documentation

## 2011-05-23 DIAGNOSIS — J449 Chronic obstructive pulmonary disease, unspecified: Secondary | ICD-10-CM | POA: Insufficient documentation

## 2011-05-23 DIAGNOSIS — F329 Major depressive disorder, single episode, unspecified: Secondary | ICD-10-CM | POA: Insufficient documentation

## 2011-05-23 DIAGNOSIS — J4489 Other specified chronic obstructive pulmonary disease: Secondary | ICD-10-CM | POA: Insufficient documentation

## 2011-05-23 DIAGNOSIS — M79609 Pain in unspecified limb: Secondary | ICD-10-CM | POA: Insufficient documentation

## 2011-05-23 DIAGNOSIS — Z87891 Personal history of nicotine dependence: Secondary | ICD-10-CM | POA: Insufficient documentation

## 2011-05-23 DIAGNOSIS — F411 Generalized anxiety disorder: Secondary | ICD-10-CM | POA: Insufficient documentation

## 2011-05-23 DIAGNOSIS — Z85118 Personal history of other malignant neoplasm of bronchus and lung: Secondary | ICD-10-CM | POA: Insufficient documentation

## 2011-05-23 DIAGNOSIS — F3289 Other specified depressive episodes: Secondary | ICD-10-CM | POA: Insufficient documentation

## 2011-05-23 LAB — BASIC METABOLIC PANEL
BUN: 38 mg/dL — ABNORMAL HIGH (ref 6–23)
Calcium: 9.5 mg/dL (ref 8.4–10.5)
GFR calc non Af Amer: 36 mL/min — ABNORMAL LOW (ref >90)
Glucose, Bld: 91 mg/dL (ref 70–99)

## 2011-05-23 LAB — CBC
Hemoglobin: 11.8 g/dL — ABNORMAL LOW (ref 12.0–15.0)
MCH: 32.3 pg (ref 26.0–34.0)
MCV: 97 fL (ref 78.0–100.0)
RBC: 3.65 MIL/uL — ABNORMAL LOW (ref 3.87–5.11)

## 2011-05-23 LAB — DIFFERENTIAL
Eosinophils Absolute: 0.1 10*3/uL (ref 0.0–0.7)
Eosinophils Relative: 2 % (ref 0–5)
Lymphs Abs: 1 10*3/uL (ref 0.7–4.0)
Monocytes Relative: 16 % — ABNORMAL HIGH (ref 3–12)

## 2011-05-23 MED ORDER — VANCOMYCIN HCL 10 G IV SOLR: 1.0000 g | Freq: Once | INTRAVENOUS | Status: DC

## 2011-05-23 MED ORDER — MORPHINE SULFATE 4 MG/ML IJ SOLN
4.0000 mg | Freq: Once | INTRAMUSCULAR | Status: AC
  Administered 2011-05-23: 4 mg via INTRAVENOUS
  Filled 2011-05-23: qty 1

## 2011-05-23 MED ORDER — VANCOMYCIN HCL IN DEXTROSE 1-5 GM/200ML-% IV SOLN
1000.0000 mg | Freq: Once | INTRAVENOUS | Status: AC
  Administered 2011-05-23: 1 g via INTRAVENOUS

## 2011-05-23 MED ORDER — CLINDAMYCIN HCL 150 MG PO CAPS: 450.0000 mg | ORAL_CAPSULE | Freq: Three times a day (TID) | ORAL | Status: AC

## 2011-05-23 MED ORDER — VANCOMYCIN HCL IN DEXTROSE 1-5 GM/200ML-% IV SOLN
INTRAVENOUS | Status: AC
  Administered 2011-05-23: 1 g via INTRAVENOUS
  Filled 2011-05-23: qty 200

## 2011-05-23 NOTE — Discharge Instructions (Signed)

## 2011-05-23 NOTE — Progress Notes (Signed)
VASCULAR LAB PRELIMINARY  PRELIMINARY  PRELIMINARY  PRELIMINARY  Right lower extremity venous Doppler completed.    Preliminary report:  There is no DVT or SVT noted in the right lower extremity.  Sherren Kerns [place], 05/23/2011, 12:37 PM

## 2011-05-23 NOTE — ED Provider Notes (Addendum)
History     CSN: [id number]  Arrival date & time 05/23/11  7829   First MD Initiated Contact with Patient 05/23/11 1131      Chief Complaint  Patient presents with  . Leg Swelling    (Consider location/radiation/quality/duration/timing/severity/associated sxs/prior treatment) HPI Comments: Patient presents with right lower leg pain for the last several days.  She's noted that her lower leg is become more swollen diffusely.  She's also got an area of erythema and itchiness on her right lower leg.  No known injuries.  No prior history of DVTs or PEs.  Patient has intermittently had problems with lower extremity swelling in the past without specific cause that the patient can inform me of.  No fevers.  No nausea vomiting.  Patient is a 71 y.o. female presenting with leg pain. The history is provided by the patient. No language interpreter was used.  Leg Pain  The incident occurred more than 2 days ago. The incident occurred at home. There was no injury mechanism. The pain is present in the right leg. The quality of the pain is described as aching. The pain is moderate. The pain has been constant since onset.    Past Medical History  Diagnosis Date  . Cancer     lung ca  . 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 HYPERLIPIDEMIA 11/18/2006  . HYPERKALEMIA 02/05/2008  . ANEMIA-IRON DEFICIENCY 09/22/2008  . Anemia of other chronic disease 02/05/2008  . ANXIETY 06/03/2008  . DEPRESSION 04/28/2009  . HYPERTENSION 11/18/2006  . PERICARDITIS 01/03/2007  . AORTIC STENOSIS/ INSUFFICIENCY, NON-RHEUMATIC 12/03/2008  . SINUSITIS- ACUTE-NOS 11/20/2007  . SINUSITIS, CHRONIC 02/05/2008  . ALLERGIC RHINITIS 11/18/2006  . PNEUMONIA 05/02/2007  . C O P D 06/27/2008  . Stricture and stenosis of esophagus 11/14/2008  . GERD 01/03/2007  . BARRETTS ESOPHAGUS 11/14/2008  . RENAL INSUFFICIENCY 02/05/2008  . DYSPEPSIA 03/23/2007  . PRURITUS 10/08/2008  . OSTEOARTHRITIS 11/18/2006  . OSTEOARTHRITIS, KNEE, LEFT  01/03/2007  . Cervicalgia 01/13/2009  . SPONDYLOSIS, CERVICAL, WITH RADICULOPATHY 01/13/2009  . BACK PAIN 02/04/2009  . BURSITIS, LEFT HIP 04/18/2009  . OSTEOPOROSIS 11/18/2006  . INSOMNIA-SLEEP DISORDER-UNSPEC 06/03/2008  . FATIGUE 04/18/2009  . PERIPHERAL EDEMA 02/13/2009  . MURMUR 11/18/2006  . DYSPHAGIA UNSPECIFIED 03/23/2007  . Abdominal pain, generalized 03/01/2007  . Nonspecific (abnormal) findings on radiological and other examination of body structure 06/03/2008  . Tuberculin Test Reaction 11/18/2006  . Acute bronchitis 02/23/2010  . CHOLELITHIASIS 04/24/2010  . NEPHROLITHIASIS, HX OF 04/24/2010  . Polyarthralgia 06/09/2010  . Elevated sed rate 06/11/2010  . Positive ANA (antinuclear antibody) 06/11/2010  . Rheumatoid factor positive 06/11/2010  . Cervical radiculopathy 09/28/2010  . Lumbar radiculopathy 09/28/2010    Past Surgical History  Procedure Date  . Abdominal hysterectomy   . Tubal ligation   . Tumor removal 08/06/08    from lungs  . Anterior cervical decomp/discectomy fusion 01/15/2011    Procedure: ANTERIOR CERVICAL DECOMPRESSION/DISCECTOMY FUSION 2 LEVELS;  Surgeon: Kathaleen Maser Pool;  Location: [facility];  Service: Neurosurgery;  Laterality: N/A;  Cervical Three-Four, Cervical Four-Five Anterior Cervical Decompression Fusion   . Colonscopy     Family History  Problem Relation Age of Onset  . Hyperlipidemia Other   . Hypertension Other   . Coronary artery disease Other     several nephrew  . Heart disease Father   . Colon cancer Neg Hx     History  Substance Use Topics  . Smoking status: Former Smoker -- 1.5  packs/day for 40 years    Types: Cigarettes    Quit date: 02/15/2006  . Smokeless tobacco: Former Neurosurgeon  . Alcohol Use: No    OB History    Grav Para Term Preterm Abortions TAB SAB Ect Mult Living                  Review of Systems  Constitutional: Negative.  Negative for fever and chills.  HENT: Negative.   Eyes: Negative.  Negative for discharge and  redness.  Respiratory: Negative.  Negative for cough and shortness of breath.   Cardiovascular: Negative.  Negative for chest pain.  Gastrointestinal: Negative.  Negative for nausea, vomiting, abdominal pain and diarrhea.  Genitourinary: Negative.  Negative for dysuria and vaginal discharge.  Musculoskeletal: Negative for back pain.  Skin: Positive for color change. Negative for rash.  Neurological: Negative.  Negative for syncope and headaches.  Hematological: Negative.  Negative for adenopathy.  Psychiatric/Behavioral: Negative.  Negative for confusion.  All other systems reviewed and are negative.    Allergies  Amoxicillin; Fentanyl; Hydrocodone; Codeine; and Penicillins  Home Medications   Current Outpatient Rx  Name Route Sig Dispense Refill  . ALBUTEROL SULFATE HFA 108 (90 BASE) MCG/ACT IN AERS Inhalation Inhale 1-2 puffs into the lungs 4 (four) times daily as needed. For wheezing or shortness of breath.    . ALENDRONATE SODIUM 70 MG PO TABS Oral Take 70 mg by mouth every 7 (seven) days. Take with a full glass of water on an empty stomach. Patient takes on Mondays.     . ALPRAZOLAM 0.5 MG PO TABS Oral Take 0.5 mg by mouth daily.     Marland Kitchen VITAMIN D3 1000 UNITS PO CAPS Oral Take 1 capsule by mouth daily.     . FUROSEMIDE 40 MG PO TABS Oral Take 40 mg by mouth daily.    Marland Kitchen GABAPENTIN 100 MG PO CAPS Oral Take 100 mg by mouth daily.     Marland Kitchen METOPROLOL SUCCINATE ER 25 MG PO TB24 Oral Take 25 mg by mouth daily.    Marland Kitchen OMEPRAZOLE 20 MG PO CPDR Oral Take 40 mg by mouth daily.     . OXYCODONE-ACETAMINOPHEN 7.5-325 MG PO TABS Oral Take 1 tablet by mouth every 6 (six) hours as needed. For pain.    Marland Kitchen PRAVASTATIN SODIUM 40 MG PO TABS Oral Take 40 mg by mouth daily.    Marland Kitchen ZOLPIDEM TARTRATE 5 MG PO TABS Oral Take 5 mg by mouth at bedtime as needed. For sleep.    Marland Kitchen GABAPENTIN 100 MG PO CAPS Oral Take 100 mg by mouth daily.        BP 143/43  Pulse 76  Temp(Src) 99.6 F (37.6 C) (Oral)  Resp 18   SpO2 95%  Physical Exam  Nursing note and vitals reviewed. Constitutional: She is oriented to person, place, and time. She appears well-developed and well-nourished.  Non-toxic appearance. She does not have a sickly appearance.  HENT:  Head: Normocephalic and atraumatic.  Eyes: Conjunctivae, EOM and lids are normal. Pupils are equal, round, and reactive to light. No scleral icterus.  Neck: Trachea normal and normal range of motion. Neck supple.  Cardiovascular: Normal rate, regular rhythm and normal heart sounds.   Pulmonary/Chest: Effort normal and breath sounds normal.  Abdominal: Soft. Normal appearance. There is no tenderness. There is no rebound, no guarding and no CVA tenderness.  Musculoskeletal: Normal range of motion. She exhibits edema.       Legs: Neurological: She is  alert and oriented to person, place, and time. She has normal strength.  Skin: Skin is warm, dry and intact. No rash noted.  Psychiatric: She has a normal mood and affect. Her behavior is normal. Judgment and thought content normal.    ED Course  Procedures (including critical care time)  Results for orders placed during the hospital encounter of 05/23/11  CBC      Component Value Range   WBC 5.7  4.0 - 10.5 (K/uL)   RBC 3.65 (*) 3.87 - 5.11 (MIL/uL)   Hemoglobin 11.8 (*) 12.0 - 15.0 (g/dL)   HCT 96.0 (*) 45.4 - 46.0 (%)   MCV 97.0  78.0 - 100.0 (fL)   MCH 32.3  26.0 - 34.0 (pg)   MCHC 33.3  30.0 - 36.0 (g/dL)   RDW 09.8  11.9 - 14.7 (%)   Platelets 180  150 - 400 (K/uL)  DIFFERENTIAL      Component Value Range   Neutrophils Relative 65  43 - 77 (%)   Neutro Abs 3.7  1.7 - 7.7 (K/uL)   Lymphocytes Relative 18  12 - 46 (%)   Lymphs Abs 1.0  0.7 - 4.0 (K/uL)   Monocytes Relative 16 (*) 3 - 12 (%)   Monocytes Absolute 0.9  0.1 - 1.0 (K/uL)   Eosinophils Relative 2  0 - 5 (%)   Eosinophils Absolute 0.1  0.0 - 0.7 (K/uL)   Basophils Relative 0  0 - 1 (%)   Basophils Absolute 0.0  0.0 - 0.1 (K/uL)    BASIC METABOLIC PANEL      Component Value Range   Sodium 133 (*) 135 - 145 (mEq/L)   Potassium 4.9  3.5 - 5.1 (mEq/L)   Chloride 97  96 - 112 (mEq/L)   CO2 25  19 - 32 (mEq/L)   Glucose, Bld 91  70 - 99 (mg/dL)   BUN 38 (*) 6 - 23 (mg/dL)   Creatinine, Ser 8.29 (*) 0.50 - 1.10 (mg/dL)   Calcium 9.5  8.4 - 56.2 (mg/dL)   GFR calc non Af Amer 36 (*) >90 (mL/min)   GFR calc Af Amer 41 (*) >90 (mL/min)  PROTIME-INR      Component Value Range   Prothrombin Time 14.7  11.6 - 15.2 (seconds)   INR 1.13  0.00 - 1.49   APTT      Component Value Range   aPTT 74 (*) 24 - 37 (seconds)        MDM  Patient with possible cellulitis versus DVT.  I will start the patient on antibiotics to cover for MRSA.  I will also obtain a lower extremity Doppler to rule out DVT.  Anticipate the patient will be able to be discharged home if the Doppler is negative.  She will followup with Dr. Jonny Ruiz as an outpatient later this week.        Nat Christen, MD 05/23/11 1152  Patient's lower extremity duplex shows no DVT.  I believe the patient may have a mild cellulitis and I will discharge her home with clindamycin to treat for possible MRSA.  Nat Christen, MD 05/23/11 [phone]

## 2011-05-23 NOTE — ED Notes (Signed)
Patient presents with bilateral lower extremity swelling x several days with increased pain.  Mild edema noted to right lower extremity with redness present.  Patient reports pain especially upon ambulation

## 2011-05-24 ENCOUNTER — Encounter: Payer: Self-pay | Admitting: Internal Medicine

## 2011-05-26 ENCOUNTER — Ambulatory Visit (INDEPENDENT_AMBULATORY_CARE_PROVIDER_SITE_OTHER): Payer: Medicare Other | Admitting: Internal Medicine

## 2011-05-26 ENCOUNTER — Encounter: Payer: Self-pay | Admitting: Internal Medicine

## 2011-05-26 VITALS — BP 122/70 | HR 69 | Temp 97.5°F | Ht 62.0 in | Wt 167.5 lb

## 2011-05-26 DIAGNOSIS — I10 Essential (primary) hypertension: Secondary | ICD-10-CM

## 2011-05-26 DIAGNOSIS — K219 Gastro-esophageal reflux disease without esophagitis: Secondary | ICD-10-CM

## 2011-05-26 DIAGNOSIS — L02419 Cutaneous abscess of limb, unspecified: Secondary | ICD-10-CM

## 2011-05-26 DIAGNOSIS — L03115 Cellulitis of right lower limb: Secondary | ICD-10-CM

## 2011-05-26 MED ORDER — PANTOPRAZOLE SODIUM 40 MG PO TBEC: 40.0000 mg | DELAYED_RELEASE_TABLET | Freq: Every day | ORAL | Status: DC

## 2011-05-26 NOTE — Assessment & Plan Note (Signed)
Ok to change to protonix 40 mg generic, to GI if worsens

## 2011-05-26 NOTE — Progress Notes (Signed)
Subjective:    Patient ID: [name], female    DOB: Jan 18, 1941, 70 y.o.   MRN: [id number]  HPI  Here to f/u ER visit, tx for celluitis after LE venous doppler neg for DVT;  Pt today doing very well with resolution of pain/red/swelling to the right lateral leg, no fever, red streaks or other s/s cellulitis worsening.  No other acute compalints except for persistent worsening reflux symptopms in the past 3-4 wks despite good complaicne with generic omeprazole 40 mg - Denies worsening  dysphagia, abd pain, n/v, bowel change or blood.  Pt denies fever, wt loss, night sweats, loss of appetite, or other constitutional symptoms  Pt denies chest pain, increased sob or doe, wheezing, orthopnea, PND, increased LE swelling, palpitations, dizziness or syncope. Denies worsening depressive symptoms, suicidal ideation, or panic, though has ongoing anxiety. Past Medical History  Diagnosis Date  . Cancer     lung ca  . 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 HYPERLIPIDEMIA 11/18/2006  . HYPERKALEMIA 02/05/2008  . ANEMIA-IRON DEFICIENCY 09/22/2008  . Anemia of other chronic disease 02/05/2008  . ANXIETY 06/03/2008  . DEPRESSION 04/28/2009  . HYPERTENSION 11/18/2006  . PERICARDITIS 01/03/2007  . AORTIC STENOSIS/ INSUFFICIENCY, NON-RHEUMATIC 12/03/2008  . SINUSITIS- ACUTE-NOS 11/20/2007  . SINUSITIS, CHRONIC 02/05/2008  . ALLERGIC RHINITIS 11/18/2006  . PNEUMONIA 05/02/2007  . C O P D 06/27/2008  . Stricture and stenosis of esophagus 11/14/2008  . GERD 01/03/2007  . BARRETTS ESOPHAGUS 11/14/2008  . RENAL INSUFFICIENCY 02/05/2008  . DYSPEPSIA 03/23/2007  . PRURITUS 10/08/2008  . OSTEOARTHRITIS 11/18/2006  . OSTEOARTHRITIS, KNEE, LEFT 01/03/2007  . Cervicalgia 01/13/2009  . SPONDYLOSIS, CERVICAL, WITH RADICULOPATHY 01/13/2009  . BACK PAIN 02/04/2009  . BURSITIS, LEFT HIP 04/18/2009  . OSTEOPOROSIS 11/18/2006  . INSOMNIA-SLEEP DISORDER-UNSPEC 06/03/2008  . FATIGUE 04/18/2009  . PERIPHERAL EDEMA 02/13/2009  .  MURMUR 11/18/2006  . DYSPHAGIA UNSPECIFIED 03/23/2007  . Abdominal pain, generalized 03/01/2007  . Nonspecific (abnormal) findings on radiological and other examination of body structure 06/03/2008  . Tuberculin Test Reaction 11/18/2006  . Acute bronchitis 02/23/2010  . CHOLELITHIASIS 04/24/2010  . NEPHROLITHIASIS, HX OF 04/24/2010  . Polyarthralgia 06/09/2010  . Elevated sed rate 06/11/2010  . Positive ANA (antinuclear antibody) 06/11/2010  . Rheumatoid factor positive 06/11/2010  . Cervical radiculopathy 09/28/2010  . Lumbar radiculopathy 09/28/2010   Past Surgical History  Procedure Date  . Abdominal hysterectomy   . Tubal ligation   . Tumor removal 08/06/08    from lungs  . Anterior cervical decomp/discectomy fusion 01/15/2011    Procedure: ANTERIOR CERVICAL DECOMPRESSION/DISCECTOMY FUSION 2 LEVELS;  Surgeon: Kathaleen Maser Pool;  Location: [facility];  Service: Neurosurgery;  Laterality: N/A;  Cervical Three-Four, Cervical Four-Five Anterior Cervical Decompression Fusion   . Colonscopy     reports that she quit smoking about 5 years ago. Her smoking use included Cigarettes. She has a 60 pack-year smoking history. She has quit using smokeless tobacco. She reports that she does not drink alcohol or use illicit drugs. family history includes Coronary artery disease in her other; Heart disease in her father; Hyperlipidemia in her other; and Hypertension in her other.  There is no history of Colon cancer. Allergies  Allergen Reactions  . Amoxicillin Itching  . Fentanyl Other (See Comments)    hallucinations  . Hydrocodone Other (See Comments)    ineffective  . Codeine Hives and Rash  . Penicillins Hives and Rash   Current Outpatient Prescriptions on File Prior to Visit  Medication Sig Dispense Refill  . albuterol (PROVENTIL HFA;VENTOLIN HFA) 108 (90 BASE) MCG/ACT inhaler Inhale 1-2 puffs into the lungs 4 (four) times daily as needed. For wheezing or shortness of breath.      Marland Kitchen alendronate (FOSAMAX)  70 MG tablet Take 70 mg by mouth every 7 (seven) days. Take with a full glass of water on an empty stomach. Patient takes on Mondays.       . ALPRAZolam (XANAX) 0.5 MG tablet Take 0.5 mg by mouth daily.       . Cholecalciferol (VITAMIN D3) 1000 UNITS CAPS Take 1 capsule by mouth daily.       . clindamycin (CLEOCIN) 150 MG capsule Take 3 capsules (450 mg total) by mouth 3 (three) times daily.  90 capsule  0  . furosemide (LASIX) 40 MG tablet Take 40 mg by mouth daily.      Marland Kitchen gabapentin (NEURONTIN) 100 MG capsule Take 100 mg by mouth daily.       . metoprolol succinate (TOPROL-XL) 25 MG 24 hr tablet Take 25 mg by mouth daily.      Marland Kitchen omeprazole (PRILOSEC) 20 MG capsule Take 40 mg by mouth daily.       Marland Kitchen oxyCODONE-acetaminophen (PERCOCET) 7.5-325 MG per tablet Take 1 tablet by mouth every 6 (six) hours as needed. For pain.      . pravastatin (PRAVACHOL) 40 MG tablet Take 40 mg by mouth daily.      Marland Kitchen zolpidem (AMBIEN) 5 MG tablet Take 5 mg by mouth at bedtime as needed. For sleep.      Marland Kitchen gabapentin (NEURONTIN) 100 MG capsule Take 100 mg by mouth daily.        . pantoprazole (PROTONIX) 40 MG tablet Take 1 tablet (40 mg total) by mouth daily.  90 tablet  3  . DISCONTD: oxyCODONE-acetaminophen (PERCOCET) 7.5-325 MG per tablet Take 1 tablet by mouth every 6 (six) hours as needed for pain.  60 tablet  0  . DISCONTD: zolpidem (AMBIEN) 5 MG tablet Take 1 tablet (5 mg total) by mouth at bedtime as needed for sleep.  30 tablet  5   Review of Systems Review of Systems  Constitutional: Negative for diaphoresis and unexpected weight change.  Gastrointestinal: Negative for vomiting and blood in stool.  Genitourinary: Negative for hematuria and decreased urine volume.  Musculoskeletal: Negative for gait problem.  Skin: Negative for color change and wound.  Neurological: Negative for tremors and numbness.  Psychiatric/Behavioral: Negative for decreased concentration. The patient is not hyperactive.         Objective:   Physical Exam BP 122/70  Pulse 69  Temp(Src) 97.5 F (36.4 C) (Oral)  Ht 5\' 2"  (1.575 m)  Wt 167 lb 8 oz (75.978 kg)  BMI 30.64 kg/m2  SpO2 91% Physical Exam  VS noted Constitutional: Pt appears well-developed and well-nourished.  Eyes: Conjunctivae and EOM are normal. Pupils are equal, round, and reactive to light.  Neck: Normal range of motion. Neck supple.  Cardiovascular: Normal rate and regular rhythm.   Pulmonary/Chest: Effort normal and breath sounds normal.  Abd: soft, NT, + BS, no guarding or rebound Neurological: Pt is alert. No cranial nerve deficit. LE motor/dtr intact Skin: Skin is warm. No erythema. /tender/swelling to right lateral leg, calf not swollen/nontense Psychiatric: Pt behavior is normal. Thought content normal. 1+ nervous    Assessment & Plan:

## 2011-05-26 NOTE — Assessment & Plan Note (Signed)
stable overall by hx and exam, most recent data reviewed with pt, and pt to continue medical treatment as before  BP Readings from Last 3 Encounters:  05/26/11 122/70  05/23/11 145/54  05/11/11 144/68

## 2011-05-26 NOTE — Assessment & Plan Note (Signed)
Resolving, to finish antibx course, reassured, educated, no need further eval or tx,  to f/u any worsening symptoms or concerns

## 2011-05-26 NOTE — Patient Instructions (Addendum)
Ok to stop the omeprazole Take all new medications as prescribed - the generic protonix 40 mg per day Continue all other medications as before Please have the pharmacy call with any refills you may need. Your leg infection has improved; please finish your antibiotic

## 2011-06-14 ENCOUNTER — Ambulatory Visit
Admission: RE | Admit: 2011-06-14 | Discharge: 2011-06-14 | Disposition: A | Discharge status: Home / Self Care | Payer: Medicare Other | Source: Ambulatory Visit | Attending: Internal Medicine | Admitting: Internal Medicine

## 2011-06-14 DIAGNOSIS — Z1231 Encounter for screening mammogram for malignant neoplasm of breast: Secondary | ICD-10-CM

## 2011-06-21 ENCOUNTER — Ambulatory Visit: Payer: Medicare Other | Admitting: Internal Medicine

## 2011-06-25 ENCOUNTER — Ambulatory Visit (INDEPENDENT_AMBULATORY_CARE_PROVIDER_SITE_OTHER): Payer: Medicare Other | Admitting: Pulmonary Disease

## 2011-06-25 ENCOUNTER — Encounter: Payer: Self-pay | Admitting: Pulmonary Disease

## 2011-06-25 VITALS — BP 132/72 | HR 66 | Temp 98.3°F | Ht 61.0 in | Wt 166.0 lb

## 2011-06-25 DIAGNOSIS — J4489 Other specified chronic obstructive pulmonary disease: Secondary | ICD-10-CM

## 2011-06-25 DIAGNOSIS — C3490 Malignant neoplasm of unspecified part of unspecified bronchus or lung: Secondary | ICD-10-CM

## 2011-06-25 DIAGNOSIS — J449 Chronic obstructive pulmonary disease, unspecified: Secondary | ICD-10-CM

## 2011-06-25 NOTE — Progress Notes (Signed)
  Subjective:    Patient ID: [name], female    DOB: [birth year], 71 y.o.   MRN: [id number]  HPI  71/F, ex smoker for FU of COPD  5/10 s/p LUL obectomy for stg I a squamous cell CA  She smoked 20yrs until 2008.  Pre-op FEV1 was 1.53 (80%) & DLCO 48%  CT 03/24/09 no recurrence  Off Spiriva since 2/11 since very expensive.   Jun 30, 2009 - Rpt PFTs >> FEv1 66%, FVC 63%, DLCO 44%   5 /24/2012 Ct jan'12 reviewed  Stopped spiriva   06/25/2011   Acute OV 3/13  Complains of persistent  congestion and sinus pressure x2-3days. Pt saw PCP ,  tx w/abx  Levaquin.Marland Kitchen  Cough and congestion are not going away.  - much improved with antibiotic Had c spine surgery,  CT chest 8/12 stable nodule in prevascular space  Review of Systems Pt denies any significant  nasal congestion or excess secretions, fever, chills, sweats, unintended wt loss, pleuritic or exertional cp, orthopnea pnd or leg swelling.  Pt also denies any obvious fluctuation in symptoms with weather or environmental change or other alleviating or aggravating factors.    Pt denies any increase in rescue therapy over baseline, denies waking up needing it or having early am exacerbations or coughing/wheezing/ or dyspnea      Objective:   Physical Exam  Gen. Pleasant, well-nourished, in no distress ENT - no lesions, no post nasal drip Neck: No JVD, no thyromegaly, no carotid bruits Lungs: no use of accessory muscles, no dullness to percussion, clear without rales or rhonchi  Cardiovascular: Rhythm regular, heart sounds  normal, no murmurs or gallops, no peripheral edema Musculoskeletal: No deformities, no cyanosis or clubbing         Assessment & Plan:

## 2011-06-25 NOTE — Assessment & Plan Note (Signed)
Rpt surveillance CT planned in 8/13

## 2011-06-25 NOTE — Patient Instructions (Signed)
You can take ZYRTEC for allergies as needed

## 2011-06-28 NOTE — Assessment & Plan Note (Signed)
Ct symbicort Ok to stay off spiriva

## 2011-07-10 ENCOUNTER — Encounter: Payer: Self-pay | Admitting: Family Medicine

## 2011-07-10 ENCOUNTER — Ambulatory Visit (INDEPENDENT_AMBULATORY_CARE_PROVIDER_SITE_OTHER): Payer: Medicare Other | Admitting: Family Medicine

## 2011-07-10 VITALS — BP 130/70 | HR 66 | Temp 98.4°F | Wt 161.1 lb

## 2011-07-10 DIAGNOSIS — J440 Chronic obstructive pulmonary disease with acute lower respiratory infection: Secondary | ICD-10-CM

## 2011-07-10 DIAGNOSIS — J209 Acute bronchitis, unspecified: Secondary | ICD-10-CM

## 2011-07-10 MED ORDER — AZITHROMYCIN 250 MG PO TABS: ORAL_TABLET | ORAL | Status: AC

## 2011-07-10 NOTE — Patient Instructions (Signed)
Drink lots of fluids and rest  Take the zithromax as directed  Use delsym cough syrup if helpful  Rest voice-do not talk a lot  Use inhaler if any wheezing  Update if not starting to improve in a week or if worsening

## 2011-07-10 NOTE — Progress Notes (Signed)
Subjective:    Patient ID: [name], female    DOB: Aug 16, 1940, 71 y.o.   MRN: [id number]  HPI Started getting uri on wed -- hoarseness would come and go  No sore throat  A little cough- but not much -- mucous was brown in color  No wheezing   Took some mucinex  Also some cough syrup delysum  Feeling worn out and very tired   Has a history of lung cancer  Used to smoke  Has copd  Uses proventil inhaler infrequently No sick contacts   A little headache - all over head   Patient Active Problem List  Diagnoses  . CARCINOMA, LUNG, SQUAMOUS CELL  . HYPERLIPIDEMIA  . ANEMIA-IRON DEFICIENCY  . ANXIETY  . DEPRESSION  . HYPERTENSION  . PERICARDITIS  . AORTIC STENOSIS/ INSUFFICIENCY, NON-RHEUMATIC  . SINUSITIS, CHRONIC  . ALLERGIC RHINITIS  . C O P D  . Stricture and stenosis of esophagus  . GERD  . BARRETTS ESOPHAGUS  . DYSPEPSIA  . OSTEOARTHRITIS  . OSTEOARTHRITIS, KNEE, LEFT  . SPONDYLOSIS, CERVICAL, WITH RADICULOPATHY  . BACK PAIN  . BURSITIS, LEFT HIP  . OSTEOPOROSIS  . INSOMNIA-SLEEP DISORDER-UNSPEC  . FATIGUE  . PERIPHERAL EDEMA  . EPISTAXIS, RECURRENT  . MURMUR  . Tuberculin Test Reaction  . CHOLELITHIASIS  . NEPHROLITHIASIS, HX OF  . Preventative health care  . Polyarthralgia  . Elevated sed rate  . Positive ANA (antinuclear antibody)  . Rheumatoid factor positive  . Cervical radiculopathy  . Lumbar radiculopathy  . Pre-operative cardiovascular examination  . Left foot pain  . Left knee pain  . Cervical cord compression with myelopathy  . Heart palpitations  . Palpitations  . Left shoulder pain  . Lower abdominal pain  . Cellulitis of right leg  . Acute bronchitis with COPD   Past Medical History  Diagnosis Date  . Cancer     lung ca  . 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 HYPERLIPIDEMIA 11/18/2006  . HYPERKALEMIA 02/05/2008  . ANEMIA-IRON DEFICIENCY 09/22/2008  . Anemia of other chronic disease 02/05/2008  . ANXIETY 06/03/2008  .  DEPRESSION 04/28/2009  . HYPERTENSION 11/18/2006  . PERICARDITIS 01/03/2007  . AORTIC STENOSIS/ INSUFFICIENCY, NON-RHEUMATIC 12/03/2008  . SINUSITIS- ACUTE-NOS 11/20/2007  . SINUSITIS, CHRONIC 02/05/2008  . ALLERGIC RHINITIS 11/18/2006  . PNEUMONIA 05/02/2007  . C O P D 06/27/2008  . Stricture and stenosis of esophagus 11/14/2008  . GERD 01/03/2007  . BARRETTS ESOPHAGUS 11/14/2008  . RENAL INSUFFICIENCY 02/05/2008  . DYSPEPSIA 03/23/2007  . PRURITUS 10/08/2008  . OSTEOARTHRITIS 11/18/2006  . OSTEOARTHRITIS, KNEE, LEFT 01/03/2007  . Cervicalgia 01/13/2009  . SPONDYLOSIS, CERVICAL, WITH RADICULOPATHY 01/13/2009  . BACK PAIN 02/04/2009  . BURSITIS, LEFT HIP 04/18/2009  . OSTEOPOROSIS 11/18/2006  . INSOMNIA-SLEEP DISORDER-UNSPEC 06/03/2008  . FATIGUE 04/18/2009  . PERIPHERAL EDEMA 02/13/2009  . MURMUR 11/18/2006  . DYSPHAGIA UNSPECIFIED 03/23/2007  . Abdominal pain, chronic, generalized 03/01/2007  . Nonspecific (abnormal) findings on radiological and other examination of body structure 06/03/2008  . Tuberculin Test Reaction 11/18/2006  . Acute bronchitis 02/23/2010  . CHOLELITHIASIS 04/24/2010  . NEPHROLITHIASIS, HX OF 04/24/2010  . Polyarthralgia 06/09/2010  . Elevated sed rate 06/11/2010  . Positive ANA (antinuclear antibody) 06/11/2010  . Rheumatoid factor positive 06/11/2010  . Cervical radiculopathy 09/28/2010  . Lumbar radiculopathy 09/28/2010   Past Surgical History  Procedure Date  . Abdominal hysterectomy   . Tubal ligation   . Tumor removal 08/06/08    from  lungs  . Anterior cervical decomp/discectomy fusion 01/15/2011    Procedure: ANTERIOR CERVICAL DECOMPRESSION/DISCECTOMY FUSION 2 LEVELS;  Surgeon: Kathaleen Maser Pool;  Location: [facility];  Service: Neurosurgery;  Laterality: N/A;  Cervical Three-Four, Cervical Four-Five Anterior Cervical Decompression Fusion   . Colonscopy    History  Substance Use Topics  . Smoking status: Former Smoker -- 1.5 packs/day for 40 years    Types: Cigarettes     Quit date: 02/15/2006  . Smokeless tobacco: Former Neurosurgeon  . Alcohol Use: No   Family History  Problem Relation Age of Onset  . Hyperlipidemia Other   . Hypertension Other   . Coronary artery disease Other     several nephrew  . Heart disease Father   . Colon cancer Neg Hx    Allergies  Allergen Reactions  . Amoxicillin Itching  . Fentanyl Other (See Comments)    hallucinations  . Hydrocodone Other (See Comments)    ineffective  . Codeine Hives and Rash  . Penicillins Hives and Rash   Current Outpatient Prescriptions on File Prior to Visit  Medication Sig Dispense Refill  . albuterol (PROVENTIL HFA;VENTOLIN HFA) 108 (90 BASE) MCG/ACT inhaler Inhale 1-2 puffs into the lungs 4 (four) times daily as needed. For wheezing or shortness of breath.      Marland Kitchen alendronate (FOSAMAX) 70 MG tablet Take 70 mg by mouth every 7 (seven) days. Take with a full glass of water on an empty stomach. Patient takes on Mondays.       . ALPRAZolam (XANAX) 0.5 MG tablet Take 0.5 mg by mouth daily.       . Cholecalciferol (VITAMIN D3) 1000 UNITS CAPS Take 1 capsule by mouth daily.       . furosemide (LASIX) 40 MG tablet Take 40 mg by mouth daily.      Marland Kitchen gabapentin (NEURONTIN) 100 MG capsule Take 100 mg by mouth daily.       Marland Kitchen oxyCODONE-acetaminophen (PERCOCET) 7.5-325 MG per tablet Take 1 tablet by mouth every 6 (six) hours as needed. For pain.      . pantoprazole (PROTONIX) 40 MG tablet Take 1 tablet (40 mg total) by mouth daily.  90 tablet  3  . pravastatin (PRAVACHOL) 40 MG tablet Take 40 mg by mouth daily.      Marland Kitchen zolpidem (AMBIEN) 5 MG tablet Take 5 mg by mouth at bedtime as needed. For sleep.      . metoprolol succinate (TOPROL-XL) 25 MG 24 hr tablet Take 25 mg by mouth daily.      Marland Kitchen DISCONTD: gabapentin (NEURONTIN) 100 MG capsule Take 100 mg by mouth daily.        Marland Kitchen DISCONTD: oxyCODONE-acetaminophen (PERCOCET) 7.5-325 MG per tablet Take 1 tablet by mouth every 6 (six) hours as needed for pain.  60 tablet   0  . DISCONTD: zolpidem (AMBIEN) 5 MG tablet Take 1 tablet (5 mg total) by mouth at bedtime as needed for sleep.  30 tablet  5        Review of Systems Review of Systems  Constitutional: Negative for fever, appetite change, and unexpected weight change. Pos for some fatigue  ENt pos for drainage  Eyes: Negative for pain and visual disturbance.  Respiratory: Neg for wheeze / pos for cough and hoarseness    Cardiovascular: Negative for cp or palpitations    Gastrointestinal: Negative for nausea, diarrhea and constipation.  Genitourinary: Negative for urgency and frequency.  Skin: Negative for pallor or rash  Neurological: Negative for weakness, light-headedness, numbness and headaches.  Hematological: Negative for adenopathy. Does not bruise/bleed easily.  Psychiatric/Behavioral: Negative for dysphoric mood. The patient is not nervous/anxious.         Objective:   Physical Exam  Constitutional: She appears well-developed and well-nourished. No distress.  HENT:  Head: Normocephalic and atraumatic.  Mouth/Throat: Oropharynx is clear and moist.  Eyes: Conjunctivae and EOM are normal. Pupils are equal, round, and reactive to light. No scleral icterus.  Neck: Normal range of motion. Neck supple. No JVD present. Carotid bruit is not present. No thyromegaly present.  Cardiovascular: Normal rate, regular rhythm, normal heart sounds and intact distal pulses.  Exam reveals no gallop.   Pulmonary/Chest: Effort normal. No respiratory distress. She has no rales. She exhibits no tenderness.       Diffusely distant bs  Harsh bs with oc rhonchi Wheeze only on forced exp  Abdominal: She exhibits no abdominal bruit.  Musculoskeletal: She exhibits no edema and no tenderness.  Lymphadenopathy:    She has no cervical adenopathy.  Neurological: She is alert. She has normal reflexes. No cranial nerve deficit. She exhibits normal muscle tone. Coordination normal.  Skin: Skin is warm and dry. No rash  noted. No erythema. No pallor.  Psychiatric: She has a normal mood and affect.          Assessment & Plan:

## 2011-07-10 NOTE — Assessment & Plan Note (Signed)
Mild- with prod cough and some rhonchi Will cover with zithromax since pt is high risk  Disc symptomatic care - see instructions on AVS  Update if not starting to improve in a week or if worsening   If no imp in hoarseness will need a cxr

## 2011-07-20 ENCOUNTER — Ambulatory Visit (INDEPENDENT_AMBULATORY_CARE_PROVIDER_SITE_OTHER)
Admission: RE | Admit: 2011-07-20 | Discharge: 2011-07-20 | Disposition: A | Discharge status: Home / Self Care | Payer: Medicare Other | Source: Ambulatory Visit | Attending: Internal Medicine | Admitting: Internal Medicine

## 2011-07-20 ENCOUNTER — Encounter: Payer: Self-pay | Admitting: Internal Medicine

## 2011-07-20 ENCOUNTER — Ambulatory Visit (INDEPENDENT_AMBULATORY_CARE_PROVIDER_SITE_OTHER): Payer: Medicare Other | Admitting: Internal Medicine

## 2011-07-20 VITALS — BP 142/70 | HR 65 | Temp 97.7°F | Ht 62.0 in | Wt 156.4 lb

## 2011-07-20 DIAGNOSIS — J440 Chronic obstructive pulmonary disease with acute lower respiratory infection: Secondary | ICD-10-CM

## 2011-07-20 DIAGNOSIS — J4489 Other specified chronic obstructive pulmonary disease: Secondary | ICD-10-CM

## 2011-07-20 DIAGNOSIS — F3289 Other specified depressive episodes: Secondary | ICD-10-CM

## 2011-07-20 DIAGNOSIS — R059 Cough, unspecified: Secondary | ICD-10-CM

## 2011-07-20 DIAGNOSIS — I10 Essential (primary) hypertension: Secondary | ICD-10-CM

## 2011-07-20 DIAGNOSIS — R05 Cough: Secondary | ICD-10-CM

## 2011-07-20 DIAGNOSIS — J209 Acute bronchitis, unspecified: Secondary | ICD-10-CM

## 2011-07-20 DIAGNOSIS — J449 Chronic obstructive pulmonary disease, unspecified: Secondary | ICD-10-CM

## 2011-07-20 DIAGNOSIS — F329 Major depressive disorder, single episode, unspecified: Secondary | ICD-10-CM

## 2011-07-20 MED ORDER — LEVOFLOXACIN 250 MG PO TABS: 250.0000 mg | ORAL_TABLET | Freq: Every day | ORAL | Status: AC

## 2011-07-20 MED ORDER — HYDROCODONE-HOMATROPINE 5-1.5 MG/5ML PO SYRP: 5.0000 mL | ORAL_SOLUTION | Freq: Four times a day (QID) | ORAL | Status: AC | PRN

## 2011-07-20 NOTE — Patient Instructions (Addendum)
Take all new medications as prescribed  - the antibiotic, and the cough medicine Please go to XRAY in the Basement for the x-ray test You will be contacted by phone if any changes need to be made immediately.  Otherwise, you will receive a letter about your results with an explanation. Please keep your appointments with your specialists as you have planned - Labs and CT aug 5, then Dr Shirline Frees Aug 6

## 2011-07-25 ENCOUNTER — Encounter: Payer: Self-pay | Admitting: Internal Medicine

## 2011-07-25 NOTE — Assessment & Plan Note (Signed)
stable overall by hx and exam, most recent data reviewed with pt, and pt to continue medical treatment as before SpO2 Readings from Last 3 Encounters:  07/20/11 95%  07/10/11 94%  06/25/11 95%

## 2011-07-25 NOTE — Assessment & Plan Note (Signed)
stable overall by hx and exam, most recent data reviewed with pt, and pt to continue medical treatment as before Lab Results  Component Value Date   WBC 5.7 05/23/2011   HGB 11.8* 05/23/2011   HCT 35.4* 05/23/2011   PLT 180 05/23/2011   GLUCOSE 91 05/23/2011   CHOL 121 04/02/2011   TRIG 102.0 04/02/2011   HDL 35.80* 04/02/2011   LDLDIRECT 141.9 05/02/2006   LDLCALC 65 04/02/2011   ALT 32 10/30/2010   AST 33 10/30/2010   NA 133* 05/23/2011   K 4.9 05/23/2011   CL 97 05/23/2011   CREATININE 1.45* 05/23/2011   BUN 38* 05/23/2011   CO2 25 05/23/2011   TSH 2.830 03/04/2011   INR 1.13 05/23/2011    

## 2011-07-25 NOTE — Assessment & Plan Note (Signed)
stable overall by hx and exam, most recent data reviewed with pt, and pt to continue medical treatment as before BP Readings from Last 3 Encounters:  07/20/11 142/70  07/10/11 130/70  06/25/11 132/72

## 2011-07-25 NOTE — Progress Notes (Signed)
Subjective:    Patient ID: [name], female    DOB: 1940/03/06, 71 y.o.   MRN: [id number]  HPI  Here after being seen may 25, but sinus symptoms just not improved  - has fever, facial pain, pressure, general weakness and malaise, and greenish d/c, with slight ST, but little to no cough and Pt denies chest pain, increased sob or doe, wheezing, orthopnea, PND, increased LE swelling, palpitations, dizziness or syncope.  Does have decreased appetitie, and some mild green prod cough as well. Due for f/u with oncology aug - with lab and CT aug 5, then MD eval on aug 6.   Pt denies polydipsia, polyuria.   Pt denies fever, wt loss, night sweats, loss of appetite, or other constitutional symptom except for the above.  Denies worsening depressive symptoms, suicidal ideation, or panic, Past Medical History  Diagnosis Date  . Cancer     lung ca  . 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 HYPERLIPIDEMIA 11/18/2006  . HYPERKALEMIA 02/05/2008  . ANEMIA-IRON DEFICIENCY 09/22/2008  . Anemia of other chronic disease 02/05/2008  . ANXIETY 06/03/2008  . DEPRESSION 04/28/2009  . HYPERTENSION 11/18/2006  . PERICARDITIS 01/03/2007  . AORTIC STENOSIS/ INSUFFICIENCY, NON-RHEUMATIC 12/03/2008  . SINUSITIS- ACUTE-NOS 11/20/2007  . SINUSITIS, CHRONIC 02/05/2008  . ALLERGIC RHINITIS 11/18/2006  . PNEUMONIA 05/02/2007  . C O P D 06/27/2008  . Stricture and stenosis of esophagus 11/14/2008  . GERD 01/03/2007  . BARRETTS ESOPHAGUS 11/14/2008  . RENAL INSUFFICIENCY 02/05/2008  . DYSPEPSIA 03/23/2007  . PRURITUS 10/08/2008  . OSTEOARTHRITIS 11/18/2006  . OSTEOARTHRITIS, KNEE, LEFT 01/03/2007  . Cervicalgia 01/13/2009  . SPONDYLOSIS, CERVICAL, WITH RADICULOPATHY 01/13/2009  . BACK PAIN 02/04/2009  . BURSITIS, LEFT HIP 04/18/2009  . OSTEOPOROSIS 11/18/2006  . INSOMNIA-SLEEP DISORDER-UNSPEC 06/03/2008  . FATIGUE 04/18/2009  . PERIPHERAL EDEMA 02/13/2009  . MURMUR 11/18/2006  . DYSPHAGIA UNSPECIFIED 03/23/2007  . Abdominal pain,  generalized 03/01/2007  . Nonspecific (abnormal) findings on radiological and other examination of body structure 06/03/2008  . Tuberculin Test Reaction 11/18/2006  . Acute bronchitis 02/23/2010  . CHOLELITHIASIS 04/24/2010  . NEPHROLITHIASIS, HX OF 04/24/2010  . Polyarthralgia 06/09/2010  . Elevated sed rate 06/11/2010  . Positive ANA (antinuclear antibody) 06/11/2010  . Rheumatoid factor positive 06/11/2010  . Cervical radiculopathy 09/28/2010  . Lumbar radiculopathy 09/28/2010   Past Surgical History  Procedure Date  . Abdominal hysterectomy   . Tubal ligation   . Tumor removal 08/06/08    from lungs  . Anterior cervical decomp/discectomy fusion 01/15/2011    Procedure: ANTERIOR CERVICAL DECOMPRESSION/DISCECTOMY FUSION 2 LEVELS;  Surgeon: Kathaleen Maser Pool;  Location: [facility];  Service: Neurosurgery;  Laterality: N/A;  Cervical Three-Four, Cervical Four-Five Anterior Cervical Decompression Fusion   . Colonscopy     reports that she quit smoking about 5 years ago. Her smoking use included Cigarettes. She has a 60 pack-year smoking history. She has quit using smokeless tobacco. She reports that she does not drink alcohol or use illicit drugs. family history includes Coronary artery disease in her other; Heart disease in her father; Hyperlipidemia in her other; and Hypertension in her other.  There is no history of Colon cancer. Allergies  Allergen Reactions  . Amoxicillin Itching  . Fentanyl Other (See Comments)    hallucinations  . Hydrocodone Other (See Comments)    ineffective  . Codeine Hives and Rash  . Penicillins Hives and Rash   Current Outpatient Prescriptions on File Prior to Visit  Medication  Sig Dispense Refill  . albuterol (PROVENTIL HFA;VENTOLIN HFA) 108 (90 BASE) MCG/ACT inhaler Inhale 1-2 puffs into the lungs 4 (four) times daily as needed. For wheezing or shortness of breath.      Marland Kitchen alendronate (FOSAMAX) 70 MG tablet Take 70 mg by mouth every 7 (seven) days. Take with a full  glass of water on an empty stomach. Patient takes on Mondays.       . ALPRAZolam (XANAX) 0.5 MG tablet Take 0.5 mg by mouth daily.       . Cholecalciferol (VITAMIN D3) 1000 UNITS CAPS Take 1 capsule by mouth daily.       . furosemide (LASIX) 40 MG tablet Take 40 mg by mouth daily.      Marland Kitchen gabapentin (NEURONTIN) 100 MG capsule Take 100 mg by mouth daily.       . metoprolol succinate (TOPROL-XL) 25 MG 24 hr tablet Take 25 mg by mouth daily.      Marland Kitchen oxyCODONE-acetaminophen (PERCOCET) 7.5-325 MG per tablet Take 1 tablet by mouth every 6 (six) hours as needed. For pain.      . pantoprazole (PROTONIX) 40 MG tablet Take 1 tablet (40 mg total) by mouth daily.  90 tablet  3  . pravastatin (PRAVACHOL) 40 MG tablet Take 40 mg by mouth daily.      Marland Kitchen zolpidem (AMBIEN) 5 MG tablet Take 5 mg by mouth at bedtime as needed. For sleep.      Marland Kitchen DISCONTD: oxyCODONE-acetaminophen (PERCOCET) 7.5-325 MG per tablet Take 1 tablet by mouth every 6 (six) hours as needed for pain.  60 tablet  0  . DISCONTD: zolpidem (AMBIEN) 5 MG tablet Take 1 tablet (5 mg total) by mouth at bedtime as needed for sleep.  30 tablet  5   Review of Systems Review of Systems  Constitutional: Negative for diaphoresis and unexpected weight change.  HENT: Negative for tinnitus.   Eyes: Negative for photophobia and visual disturbance.  Respiratory: Negative for choking and stridor.   Gastrointestinal: Negative for vomiting and blood in stool.  Genitourinary: Negative for hematuria and decreased urine volume.  Skin: Negative for color change and wound.  Neurological: Negative for tremors and numbness.  Psychiatric/Behavioral: Negative for depressed mood   Objective:   Physical Exam BP 142/70  Pulse 65  Temp(Src) 97.7 F (36.5 C) (Oral)  Ht 5\' 2"  (1.575 m)  Wt 156 lb 6 oz (70.931 kg)  BMI 28.60 kg/m2  SpO2 95% Physical Exam  VS noted, mild ill Constitutional: Pt appears well-developed and well-nourished.  HENT: Head: Normocephalic.    Right Ear: External ear normal.  Left Ear: External ear normal.  Bilat tm's mild erythema.  Sinus tender bilat.  Pharynx mild erythema Eyes: Conjunctivae and EOM are normal. Pupils are equal, round, and reactive to light.  Neck: Normal range of motion. Neck supple.  Cardiovascular: Normal rate and regular rhythm.   Pulmonary/Chest: Effort normal and breath sounds normal.  Neurological: Pt is alert. Not confused Skin: Skin is warm. No erythema. No rash Psychiatric: Pt behavior is normal. Thought content normal. not depressed affect    Assessment & Plan:

## 2011-07-25 NOTE — Assessment & Plan Note (Signed)
Mild to mod, for antibx course,  to f/u any worsening symptoms or concerns 

## 2011-08-11 ENCOUNTER — Ambulatory Visit (INDEPENDENT_AMBULATORY_CARE_PROVIDER_SITE_OTHER): Payer: Medicare Other | Admitting: Internal Medicine

## 2011-08-11 ENCOUNTER — Encounter: Payer: Self-pay | Admitting: Internal Medicine

## 2011-08-11 VITALS — BP 132/62 | HR 66 | Temp 98.3°F | Ht 62.0 in | Wt 159.0 lb

## 2011-08-11 DIAGNOSIS — J309 Allergic rhinitis, unspecified: Secondary | ICD-10-CM

## 2011-08-11 DIAGNOSIS — F329 Major depressive disorder, single episode, unspecified: Secondary | ICD-10-CM

## 2011-08-11 DIAGNOSIS — I10 Essential (primary) hypertension: Secondary | ICD-10-CM

## 2011-08-11 MED ORDER — METHYLPREDNISOLONE ACETATE 80 MG/ML IJ SUSP
120.0000 mg | Freq: Once | INTRAMUSCULAR | Status: AC
  Administered 2011-08-11: 120 mg via INTRAMUSCULAR

## 2011-08-11 MED ORDER — FEXOFENADINE HCL 180 MG PO TABS: 180.0000 mg | ORAL_TABLET | Freq: Every day | ORAL | Status: DC

## 2011-08-11 NOTE — Assessment & Plan Note (Signed)
With signficant St and cough today , last cxr reviewed, for depomedrol IM , then allegra prn, consider pulm f/u,  to f/u any worsening symptoms or concerns

## 2011-08-11 NOTE — Patient Instructions (Addendum)
You had the steroid shot today Take all new medications as prescribed - the generic allegra for allergies Continue all other medications as before You are given the copy of your last chest xray Please call if not improved in 3-5 days for referral to Pulmonary

## 2011-08-15 ENCOUNTER — Encounter: Payer: Self-pay | Admitting: Internal Medicine

## 2011-08-15 NOTE — Progress Notes (Signed)
Subjective:    Patient ID: [name], female    DOB: 01/03/1941, 71 y.o.   MRN: [id number]  HPI  Here with c/o cough non prod and ST, worse in the past 2-3 wks, no overt reflux or wheezing,  Does have several wks ongoing nasal allergy symptoms with clear congestion, itch and sneeze, without fever, pain, or wheezing.   Pt denies fever, wt loss, night sweats, loss of appetite, or other constitutional symptoms  Pt denies chest pain, increased sob or doe, wheezing, orthopnea, PND, increased LE swelling, palpitations, dizziness or syncope.  Pt denies polydipsia, polyuria. Denies worsening depressive symptoms, suicidal ideation, or panic Past Medical History  Diagnosis Date  . Cancer     lung ca  . 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 HYPERLIPIDEMIA 11/18/2006  . HYPERKALEMIA 02/05/2008  . ANEMIA-IRON DEFICIENCY 09/22/2008  . Anemia of other chronic disease 02/05/2008  . ANXIETY 06/03/2008  . DEPRESSION 04/28/2009  . HYPERTENSION 11/18/2006  . PERICARDITIS 01/03/2007  . AORTIC STENOSIS/ INSUFFICIENCY, NON-RHEUMATIC 12/03/2008  . SINUSITIS- ACUTE-NOS 11/20/2007  . SINUSITIS, CHRONIC 02/05/2008  . ALLERGIC RHINITIS 11/18/2006  . PNEUMONIA 05/02/2007  . C O P D 06/27/2008  . Stricture and stenosis of esophagus 11/14/2008  . GERD 01/03/2007  . BARRETTS ESOPHAGUS 11/14/2008  . RENAL INSUFFICIENCY 02/05/2008  . DYSPEPSIA 03/23/2007  . PRURITUS 10/08/2008  . OSTEOARTHRITIS 11/18/2006  . OSTEOARTHRITIS, KNEE, LEFT 01/03/2007  . Cervicalgia 01/13/2009  . SPONDYLOSIS, CERVICAL, WITH RADICULOPATHY 01/13/2009  . BACK PAIN 02/04/2009  . BURSITIS, LEFT HIP 04/18/2009  . OSTEOPOROSIS 11/18/2006  . INSOMNIA-SLEEP DISORDER-UNSPEC 06/03/2008  . FATIGUE 04/18/2009  . PERIPHERAL EDEMA 02/13/2009  . MURMUR 11/18/2006  . DYSPHAGIA UNSPECIFIED 03/23/2007  . Abdominal pain, generalized 03/01/2007  . Nonspecific (abnormal) findings on radiological and other examination of body structure 06/03/2008  . Tuberculin Test  Reaction 11/18/2006  . Acute bronchitis 02/23/2010  . CHOLELITHIASIS 04/24/2010  . NEPHROLITHIASIS, HX OF 04/24/2010  . Polyarthralgia 06/09/2010  . Elevated sed rate 06/11/2010  . Positive ANA (antinuclear antibody) 06/11/2010  . Rheumatoid factor positive 06/11/2010  . Cervical radiculopathy 09/28/2010  . Lumbar radiculopathy 09/28/2010   Past Surgical History  Procedure Date  . Abdominal hysterectomy   . Tubal ligation   . Tumor removal 08/06/08    from lungs  . Anterior cervical decomp/discectomy fusion 01/15/2011    Procedure: ANTERIOR CERVICAL DECOMPRESSION/DISCECTOMY FUSION 2 LEVELS;  Surgeon: Kathaleen Maser Pool;  Location: [facility];  Service: Neurosurgery;  Laterality: N/A;  Cervical Three-Four, Cervical Four-Five Anterior Cervical Decompression Fusion   . Colonscopy     reports that she quit smoking about 5 years ago. Her smoking use included Cigarettes. She has a 60 pack-year smoking history. She has quit using smokeless tobacco. She reports that she does not drink alcohol or use illicit drugs. family history includes Coronary artery disease in her other; Heart disease in her father; Hyperlipidemia in her other; and Hypertension in her other.  There is no history of Colon cancer. Allergies  Allergen Reactions  . Amoxicillin Itching  . Fentanyl Other (See Comments)    hallucinations  . Hydrocodone Other (See Comments)    ineffective  . Codeine Hives and Rash  . Penicillins Hives and Rash   Current Outpatient Prescriptions on File Prior to Visit  Medication Sig Dispense Refill  . albuterol (PROVENTIL HFA;VENTOLIN HFA) 108 (90 BASE) MCG/ACT inhaler Inhale 1-2 puffs into the lungs 4 (four) times daily as needed. For wheezing or shortness of breath.      Marland Kitchen  alendronate (FOSAMAX) 70 MG tablet Take 70 mg by mouth every 7 (seven) days. Take with a full glass of water on an empty stomach. Patient takes on Mondays.       . ALPRAZolam (XANAX) 0.5 MG tablet Take 0.5 mg by mouth daily.       .  Cholecalciferol (VITAMIN D3) 1000 UNITS CAPS Take 1 capsule by mouth daily.       . furosemide (LASIX) 40 MG tablet Take 40 mg by mouth daily.      Marland Kitchen gabapentin (NEURONTIN) 100 MG capsule Take 100 mg by mouth daily.       . metoprolol succinate (TOPROL-XL) 25 MG 24 hr tablet Take 25 mg by mouth daily.      Marland Kitchen oxyCODONE-acetaminophen (PERCOCET) 7.5-325 MG per tablet Take 1 tablet by mouth every 6 (six) hours as needed. For pain.      . pantoprazole (PROTONIX) 40 MG tablet Take 1 tablet (40 mg total) by mouth daily.  90 tablet  3  . pravastatin (PRAVACHOL) 40 MG tablet Take 40 mg by mouth daily.      Marland Kitchen zolpidem (AMBIEN) 5 MG tablet Take 5 mg by mouth at bedtime as needed. For sleep.      . fexofenadine (ALLEGRA) 180 MG tablet Take 1 tablet (180 mg total) by mouth daily.  90 tablet  3  . DISCONTD: oxyCODONE-acetaminophen (PERCOCET) 7.5-325 MG per tablet Take 1 tablet by mouth every 6 (six) hours as needed for pain.  60 tablet  0  . DISCONTD: zolpidem (AMBIEN) 5 MG tablet Take 1 tablet (5 mg total) by mouth at bedtime as needed for sleep.  30 tablet  5   Review of Systems Constitutional: Negative for diaphoresis and unexpected weight change.  HENT: Negative for drooling and tinnitus.   Eyes: Negative for photophobia and visual disturbance.  Respiratory: Negative for choking and stridor.   Gastrointestinal: Negative for vomiting and blood in stool.  Genitourinary: Negative for hematuria and decreased urine volume.  Musculoskeletal: Negative for gait problem.  Skin: Negative for color change and wound.  Neurological: Negative for tremors and numbness. .      Objective:   Physical Exam BP 132/62  Pulse 66  Temp 98.3 F (36.8 C) (Oral)  Ht 5\' 2"  (1.575 m)  Wt 159 lb (72.122 kg)  BMI 29.08 kg/m2  SpO2 95% Physical Exam  VS noted Constitutional: Pt appears well-developed and well-nourished.  HENT: Head: Normocephalic.  Right Ear: External ear normal.  Left Ear: External ear normal.  Bilat  tm's mild erythema.  Sinus nontender.  Pharynx mild erythema Eyes: Conjunctivae and EOM are normal. Pupils are equal, round, and reactive to light.  Neck: Normal range of motion. Neck supple.  Cardiovascular: Normal rate and regular rhythm.   Pulmonary/Chest: Effort normal and breath sounds normal.  - no rales or wheezing Neurological: Pt is alert. Not confused, motor 5/5 Skin: Skin is warm. No erythema. No rash Psychiatric: Pt behavior is normal. Thought content normal. not depressed appearing, mild nervous    Assessment & Plan:

## 2011-08-15 NOTE — Assessment & Plan Note (Signed)
stable overall by hx and exam, most recent data reviewed with pt, and pt to continue medical treatment as before BP Readings from Last 3 Encounters:  08/11/11 132/62  07/20/11 142/70  07/10/11 130/70

## 2011-08-15 NOTE — Assessment & Plan Note (Signed)
stable overall by hx and exam, most recent data reviewed with pt, and pt to continue medical treatment as before Lab Results  Component Value Date   WBC 5.7 05/23/2011   HGB 11.8* 05/23/2011   HCT 35.4* 05/23/2011   PLT 180 05/23/2011   GLUCOSE 91 05/23/2011   CHOL 121 04/02/2011   TRIG 102.0 04/02/2011   HDL 35.80* 04/02/2011   LDLDIRECT 141.9 05/02/2006   LDLCALC 65 04/02/2011   ALT 32 10/30/2010   AST 33 10/30/2010   NA 133* 05/23/2011   K 4.9 05/23/2011   CL 97 05/23/2011   CREATININE 1.45* 05/23/2011   BUN 38* 05/23/2011   CO2 25 05/23/2011   TSH 2.830 03/04/2011   INR 1.13 05/23/2011

## 2011-08-18 ENCOUNTER — Ambulatory Visit (INDEPENDENT_AMBULATORY_CARE_PROVIDER_SITE_OTHER): Payer: Medicare Other | Admitting: Cardiovascular Disease

## 2011-08-18 ENCOUNTER — Encounter: Payer: Self-pay | Admitting: Cardiovascular Disease

## 2011-08-18 VITALS — BP 159/71 | HR 62 | Ht 62.0 in | Wt 154.0 lb

## 2011-08-18 DIAGNOSIS — I340 Nonrheumatic mitral (valve) insufficiency: Secondary | ICD-10-CM | POA: Insufficient documentation

## 2011-08-18 DIAGNOSIS — I472 Ventricular tachycardia, unspecified: Secondary | ICD-10-CM

## 2011-08-18 DIAGNOSIS — I4729 Other ventricular tachycardia: Secondary | ICD-10-CM | POA: Insufficient documentation

## 2011-08-18 DIAGNOSIS — I059 Rheumatic mitral valve disease, unspecified: Secondary | ICD-10-CM

## 2011-08-18 NOTE — Progress Notes (Signed)
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 She was referred in May of 2010 for cardiology risk assessment prior to planned thoracotomy which she had in June 2010. Her echo prior to the surgery showed normal LV function with mild diastolic dysfunction and mild AI. She was seen in the ED at [facility] October 28, 2010 for left flank pain. It was felt to be musculoskeletal. Cardiac enzymes were normal. I last saw her in January 2013. She was seen by Norma Fredrickson, NP on 03/02/11 for c/o palpitations. An event monitor was placed and this showed PACs with short run 9 beat run of VT.  Lower extremity arterial dopplers were normal February 2013. She is known to have normal LV function by echo in October 2012. Moderate MR. Mild AI  She tells me today that her palpitations have resolved. No chest pain, SOB. She feels well overall.   Primary Care Physician: [name]  Past Medical History  Diagnosis Date  . Cancer     lung ca  . 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 HYPERLIPIDEMIA 11/18/2006  . HYPERKALEMIA 02/05/2008  . ANEMIA-IRON DEFICIENCY 09/22/2008  . Anemia of other chronic disease 02/05/2008  . ANXIETY 06/03/2008  . DEPRESSION 04/28/2009  . HYPERTENSION 11/18/2006  . PERICARDITIS 01/03/2007  . AORTIC STENOSIS/ INSUFFICIENCY, NON-RHEUMATIC 12/03/2008  . SINUSITIS- ACUTE-NOS 11/20/2007  . SINUSITIS, CHRONIC 02/05/2008  . ALLERGIC RHINITIS 11/18/2006  . PNEUMONIA 05/02/2007  . C O P D 06/27/2008  . Stricture and stenosis of esophagus 11/14/2008  . GERD 01/03/2007  . BARRETTS ESOPHAGUS 11/14/2008  . RENAL INSUFFICIENCY 02/05/2008  . DYSPEPSIA 03/23/2007  . PRURITUS 10/08/2008  . OSTEOARTHRITIS 11/18/2006  . OSTEOARTHRITIS, KNEE, LEFT 01/03/2007  . Cervicalgia 01/13/2009  . SPONDYLOSIS, CERVICAL, WITH  RADICULOPATHY 01/13/2009  . BACK PAIN 02/04/2009  . BURSITIS, LEFT HIP 04/18/2009  . OSTEOPOROSIS 11/18/2006  . INSOMNIA-SLEEP DISORDER-UNSPEC 06/03/2008  . FATIGUE 04/18/2009  . PERIPHERAL EDEMA 02/13/2009  . MURMUR 11/18/2006  . DYSPHAGIA UNSPECIFIED 03/23/2007  . Abdominal pain, generalized 03/01/2007  . Nonspecific (abnormal) findings on radiological and other examination of body structure 06/03/2008  . Tuberculin Test Reaction 11/18/2006  . Acute bronchitis 02/23/2010  . CHOLELITHIASIS 04/24/2010  . NEPHROLITHIASIS, HX OF 04/24/2010  . Polyarthralgia 06/09/2010  . Elevated sed rate 06/11/2010  . Positive ANA (antinuclear antibody) 06/11/2010  . Rheumatoid factor positive 06/11/2010  . Cervical radiculopathy 09/28/2010  . Lumbar radiculopathy 09/28/2010    Past Surgical History  Procedure Date  . Abdominal hysterectomy   . Tubal ligation   . Tumor removal 08/06/08    from lungs  . Anterior cervical decomp/discectomy fusion 01/15/2011    Procedure: ANTERIOR CERVICAL DECOMPRESSION/DISCECTOMY FUSION 2 LEVELS;  Surgeon: Kathaleen Maser Pool;  Location: [facility];  Service: Neurosurgery;  Laterality: N/A;  Cervical Three-Four, Cervical Four-Five Anterior Cervical Decompression Fusion   . Colonscopy     Current Outpatient Prescriptions  Medication Sig Dispense Refill  . albuterol (PROVENTIL HFA;VENTOLIN HFA) 108 (90 BASE) MCG/ACT inhaler Inhale 1-2 puffs into the lungs 4 (four) times daily as needed. For wheezing or shortness of breath.      Marland Kitchen alendronate (FOSAMAX) 70 MG tablet Take 70 mg by mouth every 7 (seven) days. Take with a full glass of water on an empty stomach. Patient takes on Mondays.       Marland Kitchen  ALPRAZolam (XANAX) 0.5 MG tablet Take 0.5 mg by mouth daily.       Marland Kitchen azithromycin (ZITHROMAX) 250 MG tablet daily.       . Cholecalciferol (VITAMIN D3) 1000 UNITS CAPS Take 1 capsule by mouth daily.       . furosemide (LASIX) 40 MG tablet Take 40 mg by mouth daily.      Marland Kitchen gabapentin (NEURONTIN) 100 MG  capsule Take 100 mg by mouth daily.       . metoprolol succinate (TOPROL-XL) 25 MG 24 hr tablet Take 25 mg by mouth daily.      Marland Kitchen oxyCODONE-acetaminophen (PERCOCET) 7.5-325 MG per tablet Take 1 tablet by mouth every 6 (six) hours as needed. For pain.      . pantoprazole (PROTONIX) 40 MG tablet Take 1 tablet (40 mg total) by mouth daily.  90 tablet  3  . pravastatin (PRAVACHOL) 40 MG tablet Take 40 mg by mouth daily.      Marland Kitchen zolpidem (AMBIEN) 5 MG tablet Take 5 mg by mouth at bedtime as needed. For sleep.      Marland Kitchen DISCONTD: oxyCODONE-acetaminophen (PERCOCET) 7.5-325 MG per tablet Take 1 tablet by mouth every 6 (six) hours as needed for pain.  60 tablet  0  . DISCONTD: zolpidem (AMBIEN) 5 MG tablet Take 1 tablet (5 mg total) by mouth at bedtime as needed for sleep.  30 tablet  5    Allergies  Allergen Reactions  . Amoxicillin Itching  . Fentanyl Other (See Comments)    hallucinations  . Hydrocodone Other (See Comments)    ineffective  . Codeine Hives and Rash  . Penicillins Hives and Rash    History   Social History  . Marital Status: Widowed    Spouse Name: N/A    Number of Children: N/A  . Years of Education: N/A   Occupational History  . Not on file.   Social History Main Topics  . Smoking status: Former Smoker -- 1.5 packs/day for 40 years    Types: Cigarettes    Quit date: 02/15/2006  . Smokeless tobacco: Former Neurosurgeon  . Alcohol Use: No  . Drug Use: No  . Sexually Active: Yes    Birth Control/ Protection: Post-menopausal   Other Topics Concern  . Not on file   Social History Narrative  . No narrative on file    Family History  Problem Relation Age of Onset  . Hyperlipidemia Other   . Hypertension Other   . Coronary artery disease Other     several nephrew  . Heart disease Father   . Colon cancer Neg Hx     Review of Systems:  As stated in the HPI and otherwise negative.   BP 159/71  Pulse 62  Ht 5\' 2"  (1.575 m)  Wt 154 lb (69.854 kg)  BMI 28.17  kg/m2  Physical Examination: General: Well developed, well nourished, NAD HEENT: OP clear, mucus membranes moist SKIN: warm, dry. No rashes. Neuro: No focal deficits Musculoskeletal: Muscle strength 5/5 all ext Psychiatric: Mood and affect normal Neck: No JVD, no carotid bruits, no thyromegaly, no lymphadenopathy. Lungs:Clear bilaterally, no wheezes, rhonci, crackles Cardiovascular: Regular rate and rhythm with soft systolic murmurs. No gallops or rubs. Abdomen:Soft. Bowel sounds present. Non-tender.  Extremities: No lower extremity edema. Pulses are 2 + in the bilateral DP/PT.

## 2011-08-18 NOTE — Assessment & Plan Note (Signed)
She has moderate MR by echo October 2012. Will repeat echo in one year.

## 2011-08-18 NOTE — Assessment & Plan Note (Signed)
She had a 9 beat run of VT. Normal LV function. No recurrence of palpitations. Will continue Toprol.

## 2011-08-18 NOTE — Patient Instructions (Addendum)
Your physician wants you to follow-up in:  12 months.  You will receive a reminder letter in the mail two months in advance. If you don't receive a letter, please call our office to schedule the follow-up appointment.   

## 2011-08-30 ENCOUNTER — Ambulatory Visit (INDEPENDENT_AMBULATORY_CARE_PROVIDER_SITE_OTHER): Payer: Medicare Other | Admitting: Internal Medicine

## 2011-08-30 ENCOUNTER — Encounter: Payer: Self-pay | Admitting: Internal Medicine

## 2011-08-30 VITALS — BP 140/70 | HR 63 | Temp 97.9°F | Ht 62.0 in | Wt 155.1 lb

## 2011-08-30 DIAGNOSIS — R1310 Dysphagia, unspecified: Secondary | ICD-10-CM | POA: Insufficient documentation

## 2011-08-30 DIAGNOSIS — I10 Essential (primary) hypertension: Secondary | ICD-10-CM

## 2011-08-30 DIAGNOSIS — K219 Gastro-esophageal reflux disease without esophagitis: Secondary | ICD-10-CM

## 2011-08-30 DIAGNOSIS — R490 Dysphonia: Secondary | ICD-10-CM

## 2011-08-30 MED ORDER — PANTOPRAZOLE SODIUM 40 MG PO TBEC: 40.0000 mg | DELAYED_RELEASE_TABLET | Freq: Two times a day (BID) | ORAL | Status: DC

## 2011-08-30 NOTE — Assessment & Plan Note (Addendum)
?   GI vs other related, to incr the PPI to BID, has hx of barrets and stricture, last EGD oct 2010 - to refer to Dr Kaplan/GI for prob f/u EGD, and ENT for eval as well - r/o vocal cord problem  

## 2011-08-30 NOTE — Assessment & Plan Note (Signed)
?   GI vs other related, to incr the PPI to BID, has hx of barrets and stricture, last EGD oct 2010 - to refer to Dr Kaplan/GI for prob f/u EGD, and ENT for eval as well - r/o vocal cord problem

## 2011-08-30 NOTE — Patient Instructions (Addendum)
Take all new medications as prescribed - the protonix increased to twice per day Continue all other medications as before You will be contacted regarding the referral for: ENT, and Dr Kaplan/GI Please keep your appointments with your specialists as you have planned

## 2011-08-31 ENCOUNTER — Encounter: Payer: Self-pay | Admitting: Gastroenterology

## 2011-09-10 ENCOUNTER — Other Ambulatory Visit: Payer: Self-pay

## 2011-09-10 MED ORDER — ZOLPIDEM TARTRATE 5 MG PO TABS: 5.0000 mg | ORAL_TABLET | Freq: Every evening | ORAL | Status: DC | PRN

## 2011-09-10 NOTE — Telephone Encounter (Signed)
Faxed hardcopy to pharmacy. 

## 2011-09-10 NOTE — Telephone Encounter (Signed)
Done hardcopy to robin  

## 2011-09-12 ENCOUNTER — Encounter: Payer: Self-pay | Admitting: Internal Medicine

## 2011-09-12 NOTE — Assessment & Plan Note (Signed)
stable overall by hx and exam, most recent data reviewed with pt, and pt to continue medical treatment as before BP Readings from Last 3 Encounters:  08/30/11 140/70  08/18/11 159/71  08/11/11 132/62

## 2011-09-12 NOTE — Assessment & Plan Note (Signed)
For increased PPI , may help with hoarseness and dysphagia,  to f/u any worsening symptoms or concerns

## 2011-09-12 NOTE — Progress Notes (Signed)
Subjective:    Patient ID: [name], female    DOB: 23-Jan-1941, 71 y.o.   MRN: [id number]  HPI  Here with onset hoarseness for 4 wks, mild persistent without HA, fever, sinus or ear symtpoms, cough and Pt denies chest pain, increased sob or doe, wheezing, orthopnea, PND, increased LE swelling, palpitations, dizziness or syncope.  Pt denies new neurological symptoms such as new headache, or facial or extremity weakness or numbness   Pt denies polydipsia, polyuria.  Has had worsening dysphagia, but no worsening reflux, abd pain, n/v, bowel change or blood.   Pt denies fever, wt loss, night sweats, loss of appetite, or other constitutional symptoms  Denies worsening depressive symptoms, suicidal ideation, or panic. Past Medical History  Diagnosis Date  . Cancer     lung ca  . 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 HYPERLIPIDEMIA 11/18/2006  . HYPERKALEMIA 02/05/2008  . ANEMIA-IRON DEFICIENCY 09/22/2008  . Anemia of other chronic disease 02/05/2008  . ANXIETY 06/03/2008  . DEPRESSION 04/28/2009  . HYPERTENSION 11/18/2006  . PERICARDITIS 01/03/2007  . AORTIC STENOSIS/ INSUFFICIENCY, NON-RHEUMATIC 12/03/2008  . SINUSITIS- ACUTE-NOS 11/20/2007  . SINUSITIS, CHRONIC 02/05/2008  . ALLERGIC RHINITIS 11/18/2006  . PNEUMONIA 05/02/2007  . C O P D 06/27/2008  . Stricture and stenosis of esophagus 11/14/2008  . GERD 01/03/2007  . BARRETTS ESOPHAGUS 11/14/2008  . RENAL INSUFFICIENCY 02/05/2008  . DYSPEPSIA 03/23/2007  . PRURITUS 10/08/2008  . OSTEOARTHRITIS 11/18/2006  . OSTEOARTHRITIS, KNEE, LEFT 01/03/2007  . Cervicalgia 01/13/2009  . SPONDYLOSIS, CERVICAL, WITH RADICULOPATHY 01/13/2009  . BACK PAIN 02/04/2009  . BURSITIS, LEFT HIP 04/18/2009  . OSTEOPOROSIS 11/18/2006  . INSOMNIA-SLEEP DISORDER-UNSPEC 06/03/2008  . FATIGUE 04/18/2009  . PERIPHERAL EDEMA 02/13/2009  . MURMUR 11/18/2006  . DYSPHAGIA UNSPECIFIED 03/23/2007  . Abdominal pain, generalized 03/01/2007  . Nonspecific (abnormal) findings on  radiological and other examination of body structure 06/03/2008  . Tuberculin Test Reaction 11/18/2006  . Acute bronchitis 02/23/2010  . CHOLELITHIASIS 04/24/2010  . NEPHROLITHIASIS, HX OF 04/24/2010  . Polyarthralgia 06/09/2010  . Elevated sed rate 06/11/2010  . Positive ANA (antinuclear antibody) 06/11/2010  . Rheumatoid factor positive 06/11/2010  . Cervical radiculopathy 09/28/2010  . Lumbar radiculopathy 09/28/2010   Past Surgical History  Procedure Date  . Abdominal hysterectomy   . Tubal ligation   . Tumor removal 08/06/08    from lungs  . Anterior cervical decomp/discectomy fusion 01/15/2011    Procedure: ANTERIOR CERVICAL DECOMPRESSION/DISCECTOMY FUSION 2 LEVELS;  Surgeon: Kathaleen Maser Pool;  Location: [facility];  Service: Neurosurgery;  Laterality: N/A;  Cervical Three-Four, Cervical Four-Five Anterior Cervical Decompression Fusion   . Colonscopy     reports that she quit smoking about 5 years ago. Her smoking use included Cigarettes. She has a 60 pack-year smoking history. She has quit using smokeless tobacco. She reports that she does not drink alcohol or use illicit drugs. family history includes Coronary artery disease in her other; Heart disease in her father; Hyperlipidemia in her other; and Hypertension in her other.  There is no history of Colon cancer. Allergies  Allergen Reactions  . Amoxicillin Itching  . Fentanyl Other (See Comments)    hallucinations  . Hydrocodone Other (See Comments)    ineffective  . Codeine Hives and Rash  . Penicillins Hives and Rash   Current Outpatient Prescriptions on File Prior to Visit  Medication Sig Dispense Refill  . albuterol (PROVENTIL HFA;VENTOLIN HFA) 108 (90 BASE) MCG/ACT inhaler Inhale 1-2 puffs into the lungs  4 (four) times daily as needed. For wheezing or shortness of breath.      Marland Kitchen alendronate (FOSAMAX) 70 MG tablet Take 70 mg by mouth every 7 (seven) days. Take with a full glass of water on an empty stomach. Patient takes on Mondays.        . ALPRAZolam (XANAX) 0.5 MG tablet Take 0.5 mg by mouth daily.       Marland Kitchen azithromycin (ZITHROMAX) 250 MG tablet daily.       . Cholecalciferol (VITAMIN D3) 1000 UNITS CAPS Take 1 capsule by mouth daily.       . furosemide (LASIX) 40 MG tablet Take 40 mg by mouth daily.      Marland Kitchen gabapentin (NEURONTIN) 100 MG capsule Take 100 mg by mouth daily.       . metoprolol succinate (TOPROL-XL) 25 MG 24 hr tablet Take 25 mg by mouth daily.      Marland Kitchen oxyCODONE-acetaminophen (PERCOCET) 7.5-325 MG per tablet Take 1 tablet by mouth every 6 (six) hours as needed. For pain.      . pantoprazole (PROTONIX) 40 MG tablet Take 1 tablet (40 mg total) by mouth 2 (two) times daily.  180 tablet  3  . pravastatin (PRAVACHOL) 40 MG tablet Take 40 mg by mouth daily.      Marland Kitchen DISCONTD: oxyCODONE-acetaminophen (PERCOCET) 7.5-325 MG per tablet Take 1 tablet by mouth every 6 (six) hours as needed for pain.  60 tablet  0  . DISCONTD: zolpidem (AMBIEN) 5 MG tablet Take 1 tablet (5 mg total) by mouth at bedtime as needed for sleep.  30 tablet  5   Review of Systems Review of Systems  Constitutional: Negative for diaphoresis and unexpected weight change.  HENT: Negative for drooling and tinnitus.   Eyes: Negative for photophobia and visual disturbance.  Respiratory: Negative for choking and stridor.   Gastrointestinal: Negative for vomiting and blood in stool.  Genitourinary: Negative for hematuria and decreased urine volume.  Musculoskeletal: Negative for acute joint swelling Skin: Negative for color change and wound.  Neurological: Negative for tremors and numbness.        Objective:   Physical Exam BP 140/70  Pulse 63  Temp 97.9 F (36.6 C) (Oral)  Ht 5\' 2"  (1.575 m)  Wt 155 lb 2 oz (70.364 kg)  BMI 28.37 kg/m2  SpO2 95% Physical Exam  VS noted Constitutional: Pt appears well-developed and well-nourished.  HENT: Head: Normocephalic.  Right Ear: External ear normal.  Left Ear: External ear normal.  Bilat tm's mild  erythema.  Sinus nontender.  Pharynx mild erythema Eyes: Conjunctivae and EOM are normal. Pupils are equal, round, and reactive to light.  Neck: Normal range of motion. Neck supple.  Cardiovascular: Normal rate and regular rhythm.   Pulmonary/Chest: Effort normal and breath sounds normal.  Abd:  Soft, NT, non-distended, + BS Neurological: Pt is alert. Not confused Skin: Skin is warm. No erythema.  Psychiatric: Pt behavior is normal. Thought content normal.     Assessment & Plan:

## 2011-09-20 ENCOUNTER — Ambulatory Visit (HOSPITAL_COMMUNITY)
Admission: RE | Admit: 2011-09-20 | Discharge: 2011-09-20 | Disposition: A | Discharge status: Home / Self Care | Payer: Medicare Other | Source: Ambulatory Visit | Attending: Internal Medicine | Admitting: Internal Medicine

## 2011-09-20 ENCOUNTER — Other Ambulatory Visit (HOSPITAL_BASED_OUTPATIENT_CLINIC_OR_DEPARTMENT_OTHER): Payer: Medicare Other

## 2011-09-20 ENCOUNTER — Other Ambulatory Visit: Payer: Self-pay | Admitting: Internal Medicine

## 2011-09-20 DIAGNOSIS — R490 Dysphonia: Secondary | ICD-10-CM | POA: Insufficient documentation

## 2011-09-20 DIAGNOSIS — C3490 Malignant neoplasm of unspecified part of unspecified bronchus or lung: Secondary | ICD-10-CM | POA: Insufficient documentation

## 2011-09-20 DIAGNOSIS — M949 Disorder of cartilage, unspecified: Secondary | ICD-10-CM | POA: Insufficient documentation

## 2011-09-20 DIAGNOSIS — N200 Calculus of kidney: Secondary | ICD-10-CM | POA: Insufficient documentation

## 2011-09-20 DIAGNOSIS — I700 Atherosclerosis of aorta: Secondary | ICD-10-CM | POA: Insufficient documentation

## 2011-09-20 DIAGNOSIS — M899 Disorder of bone, unspecified: Secondary | ICD-10-CM | POA: Insufficient documentation

## 2011-09-20 DIAGNOSIS — K8020 Calculus of gallbladder without cholecystitis without obstruction: Secondary | ICD-10-CM | POA: Insufficient documentation

## 2011-09-20 LAB — CBC WITH DIFFERENTIAL/PLATELET
Basophils Absolute: 0 10*3/uL (ref 0.0–0.1)
EOS%: 6.7 % (ref 0.0–7.0)
HCT: 36.2 % (ref 34.8–46.6)
HGB: 11.9 g/dL (ref 11.6–15.9)
MCH: 33.6 pg (ref 25.1–34.0)
MCV: 102.4 fL — ABNORMAL HIGH (ref 79.5–101.0)
MONO%: 13.2 % (ref 0.0–14.0)
NEUT%: 57.5 % (ref 38.4–76.8)
lymph#: 0.8 10*3/uL — ABNORMAL LOW (ref 0.9–3.3)

## 2011-09-20 LAB — CMP (CANCER CENTER ONLY)
AST: 17 U/L (ref 11–38)
BUN, Bld: 20 mg/dL (ref 7–22)
Calcium: 9.1 mg/dL (ref 8.0–10.3)
Chloride: 100 mEq/L (ref 98–108)
Creat: 1.6 mg/dl — ABNORMAL HIGH (ref 0.6–1.2)
Glucose, Bld: 88 mg/dL (ref 73–118)

## 2011-09-20 MED ORDER — IOHEXOL 300 MG/ML  SOLN
50.0000 mL | Freq: Once | INTRAMUSCULAR | Status: AC | PRN
  Administered 2011-09-20: 50 mL via INTRAVENOUS

## 2011-09-21 ENCOUNTER — Ambulatory Visit (HOSPITAL_BASED_OUTPATIENT_CLINIC_OR_DEPARTMENT_OTHER): Payer: Medicare Other | Admitting: Internal Medicine

## 2011-09-21 VITALS — BP 174/74 | HR 72 | Temp 98.3°F | Resp 18 | Ht 62.0 in | Wt 154.6 lb

## 2011-09-21 DIAGNOSIS — C3490 Malignant neoplasm of unspecified part of unspecified bronchus or lung: Secondary | ICD-10-CM

## 2011-09-21 DIAGNOSIS — C3410 Malignant neoplasm of upper lobe, unspecified bronchus or lung: Secondary | ICD-10-CM

## 2011-09-21 NOTE — Progress Notes (Signed)
[facility] [phone]   [phone]  OFFICE PROGRESS NOTE  Jody Barre, MD [address] [facility] 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  PRIOR THERAPY:Left upper lobectomy with node dissection and bronchoplasty and intercostal muscle flap under the care of Dr. [name] on 08/06/2008.  CURRENT THERAPY: Observation.  INTERVAL HISTORY: [name] 71 y.o. female returns to the clinic today for annual followup visit accompanied by her daughter. The patient has no complaints today. She denied having any significant chest pain, shortness of breath, cough or hemoptysis. She denied having any significant weight loss or night sweats. The patient has repeat CT scan of the chest performed recently and she is here today for evaluation and discussion of her scan results.  MEDICAL HISTORY: Past Medical History  Diagnosis Date  . Cancer     lung ca  . 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 HYPERLIPIDEMIA 11/18/2006  . HYPERKALEMIA 02/05/2008  . ANEMIA-IRON DEFICIENCY 09/22/2008  . Anemia of other chronic disease 02/05/2008  . ANXIETY 06/03/2008  . DEPRESSION 04/28/2009  . HYPERTENSION 11/18/2006  . PERICARDITIS 01/03/2007  . AORTIC STENOSIS/ INSUFFICIENCY, NON-RHEUMATIC 12/03/2008  . SINUSITIS- ACUTE-NOS 11/20/2007  . SINUSITIS, CHRONIC 02/05/2008  . ALLERGIC RHINITIS 11/18/2006  . PNEUMONIA 05/02/2007  . C O P D 06/27/2008  . Stricture and stenosis of esophagus 11/14/2008  . GERD 01/03/2007  . BARRETTS ESOPHAGUS 11/14/2008  . RENAL INSUFFICIENCY 02/05/2008  . DYSPEPSIA 03/23/2007  . PRURITUS 10/08/2008  . OSTEOARTHRITIS 11/18/2006  . OSTEOARTHRITIS, KNEE, LEFT 01/03/2007  . Cervicalgia 01/13/2009  . SPONDYLOSIS, CERVICAL, WITH RADICULOPATHY 01/13/2009  . BACK PAIN 02/04/2009  . BURSITIS, LEFT HIP  04/18/2009  . OSTEOPOROSIS 11/18/2006  . INSOMNIA-SLEEP DISORDER-UNSPEC 06/03/2008  . FATIGUE 04/18/2009  . PERIPHERAL EDEMA 02/13/2009  . MURMUR 11/18/2006  . DYSPHAGIA UNSPECIFIED 03/23/2007  . Abdominal pain, generalized 03/01/2007  . Nonspecific (abnormal) findings on radiological and other examination of body structure 06/03/2008  . Tuberculin Test Reaction 11/18/2006  . Acute bronchitis 02/23/2010  . CHOLELITHIASIS 04/24/2010  . NEPHROLITHIASIS, HX OF 04/24/2010  . Polyarthralgia 06/09/2010  . Elevated sed rate 06/11/2010  . Positive ANA (antinuclear antibody) 06/11/2010  . Rheumatoid factor positive 06/11/2010  . Cervical radiculopathy 09/28/2010  . Lumbar radiculopathy 09/28/2010    ALLERGIES:  is allergic to amoxicillin; fentanyl; hydrocodone; codeine; and penicillins.  MEDICATIONS:  Current Outpatient Prescriptions  Medication Sig Dispense Refill  . alendronate (FOSAMAX) 70 MG tablet Take 70 mg by mouth every 7 (seven) days. Take with a full glass of water on an empty stomach. Patient takes on Mondays.       . ALPRAZolam (XANAX) 0.5 MG tablet Take 0.5 mg by mouth daily.       . Cholecalciferol (VITAMIN D3) 1000 UNITS CAPS Take 1 capsule by mouth daily.       . furosemide (LASIX) 40 MG tablet Take 40 mg by mouth daily.      . metoprolol succinate (TOPROL-XL) 25 MG 24 hr tablet Take 25 mg by mouth daily.      Marland Kitchen oxyCODONE-acetaminophen (PERCOCET) 7.5-325 MG per tablet Take 1 tablet by mouth every 6 (six) hours as needed. For pain.      . pantoprazole (PROTONIX) 40 MG tablet Take 1 tablet (40 mg total) by mouth 2 (two) times daily.  180 tablet  3  . pravastatin (PRAVACHOL) 40 MG tablet Take 40 mg by mouth daily.      Marland Kitchen zolpidem (AMBIEN) 5 MG tablet Take 1 tablet (5 mg total) by mouth at bedtime as needed. For sleep.  30 tablet  5  . albuterol (PROVENTIL HFA;VENTOLIN HFA) 108 (90 BASE) MCG/ACT inhaler Inhale 1-2 puffs into the lungs 4 (four) times daily as needed. For wheezing or shortness of breath.       . DISCONTD: oxyCODONE-acetaminophen (PERCOCET) 7.5-325 MG per tablet Take 1 tablet by mouth every 6 (six) hours as needed for pain.  60 tablet  0  . DISCONTD: zolpidem (AMBIEN) 5 MG tablet Take 1 tablet (5 mg total) by mouth at bedtime as needed for sleep.  30 tablet  5    SURGICAL HISTORY:  Past Surgical History  Procedure Date  . Abdominal hysterectomy   . Tubal ligation   . Tumor removal 08/06/08    from lungs  . Anterior cervical decomp/discectomy fusion 01/15/2011    Procedure: ANTERIOR CERVICAL DECOMPRESSION/DISCECTOMY FUSION 2 LEVELS;  Surgeon: Kathaleen Maser Pool;  Location: [facility];  Service: Neurosurgery;  Laterality: N/A;  Cervical Three-Four, Cervical Four-Five Anterior Cervical Decompression Fusion   . Colonscopy     REVIEW OF SYSTEMS:  A comprehensive review of systems was negative.   PHYSICAL EXAMINATION: General appearance: alert, cooperative and no distress Head: Normocephalic, without obvious abnormality, atraumatic Neck: no adenopathy Lymph nodes: Cervical, supraclavicular, and axillary nodes normal. Resp: clear to auscultation bilaterally Back: symmetric, no curvature. ROM normal. No CVA tenderness. Cardio: regular rate and rhythm, S1, S2 normal, no murmur, click, rub or gallop GI: soft, non-tender; bowel sounds normal; no masses,  no organomegaly Extremities: extremities normal, atraumatic, no cyanosis or edema Neurologic: Alert and oriented X 3, normal strength and tone. Normal symmetric reflexes. Normal coordination and gait  ECOG PERFORMANCE STATUS: 0 - Asymptomatic  Blood pressure 174/74, pulse 72, temperature 98.3 F (36.8 C), temperature source Oral, resp. rate 18, height 5\' 2"  (1.575 m), weight 154 lb 9.6 oz (70.126 kg).  LABORATORY DATA: Lab Results  Component Value Date   WBC 3.5* 09/20/2011   HGB 11.9 09/20/2011   HCT 36.2 09/20/2011   MCV 102.4* 09/20/2011   PLT 198 09/20/2011      Chemistry      Component Value Date/Time   NA 147* 09/20/2011 0817    NA 133* 05/23/2011 1208   K 4.2 09/20/2011 0817   K 4.9 05/23/2011 1208   CL 100 09/20/2011 0817   CL 97 05/23/2011 1208   CO2 29 09/20/2011 0817   CO2 25 05/23/2011 1208   BUN 20 09/20/2011 0817   BUN 38* 05/23/2011 1208   CREATININE 1.6* 09/20/2011 0817   CREATININE 1.45* 05/23/2011 1208      Component Value Date/Time   CALCIUM 9.1 09/20/2011 0817   CALCIUM 9.5 05/23/2011 1208   ALKPHOS 69 09/20/2011 0817   ALKPHOS 56 10/30/2010 0924   AST 17 09/20/2011 0817   AST 33 10/30/2010 0924   ALT 32 10/30/2010 0924   BILITOT 0.60 09/20/2011 0817   BILITOT 0.5 10/30/2010 0924       RADIOGRAPHIC STUDIES: Ct Chest W Contrast  09/20/2011  *RADIOLOGY REPORT*  Clinical Data: Follow up lung cancer, hoarseness  CT CHEST WITH CONTRAST  Technique:  Multidetector CT imaging of the chest was performed following the standard protocol during bolus administration of intravenous contrast.  Contrast: 50mL OMNIPAQUE IOHEXOL 300 MG/ML  SOLN  Comparison: 09/17/2010  Findings: Again noted volume loss and postsurgical changes in the left hemithorax.  Mild emphysematous changes right upper lobe.  Sagittal images of the spine shows no destructive bony lesions. Stable osteopenia and mild degenerative changes thoracic spine. Sagittal view of the sternum is unremarkable.  Atherosclerotic calcifications of thoracic aorta again noted. Heart size is stable.  No pericardial effusion.  There is no hilar adenopathy.  No mediastinal adenopathy.  Stable left anterior prevascular mediastinal stranding and a vague nodularity without change in appearance from prior exam.  This most likely due to scarring.  Stable small accessory splenule in visualized upper abdomen.  Images of the lung parenchyma shows no acute infiltrate or pulmonary edema.  Stable mild interstitial thickening right base posteriorly probable due to scarring.  No adrenal gland mass is noted in visualized upper abdomen. Visualized unenhanced liver is unremarkable. Small calcified gallstones are  noted in partially visualized gallbladder the largest measures 4.3 mm.  There is a nonobstructive calcified calculus in mid pole of the left kidney measures 3 mm.  Visualized unenhanced pancreas is unremarkable.  IMPRESSION:  1.  No significant change.  Stable postsurgical changes volume loss left hemithorax.  Mild emphysematous changes right upper lobe. 2.  Stable vague nodularity and stranding in the prevascular space without change in appearance from prior exam.  3.  No recurrent lung consolidation is identified. 4.  Small calcified gallstones are noted within gallbladder the largest measures 4.3 mm.  Original Report Authenticated By: Natasha Mead, M.D.    ASSESSMENT: This is a very pleasant 71 years old African American female with history of stage IA non-small cell lung cancer, squamous cell carcinoma, status post left upper lobectomy with lymph node dissection. The patient is doing fine and she has no evidence for disease recurrence on the recent scan.   PLAN: I discussed the scan results with the patient and her daughter. I recommended for her continuous observation with repeat CT scan of the chest without contrast in one year.  The patient was advised to call me immediately if she has any concerning symptoms in the interval.  All questions were answered. The patient knows to call the clinic with any problems, questions or concerns. We can certainly see the patient much sooner if necessary.

## 2011-09-23 ENCOUNTER — Telehealth: Payer: Self-pay | Admitting: *Deleted

## 2011-09-23 NOTE — Telephone Encounter (Signed)
mailed out calendar to inform the patient of the new date and time on 09-2012 

## 2011-09-24 ENCOUNTER — Encounter: Payer: Self-pay | Admitting: Internal Medicine

## 2011-09-24 ENCOUNTER — Ambulatory Visit (INDEPENDENT_AMBULATORY_CARE_PROVIDER_SITE_OTHER): Payer: Medicare Other | Admitting: Internal Medicine

## 2011-09-24 VITALS — BP 140/68 | HR 66 | Temp 97.6°F | Ht 62.0 in | Wt 155.0 lb

## 2011-09-24 DIAGNOSIS — Z0000 Encounter for general adult medical examination without abnormal findings: Secondary | ICD-10-CM

## 2011-09-24 DIAGNOSIS — M25512 Pain in left shoulder: Secondary | ICD-10-CM

## 2011-09-24 DIAGNOSIS — F411 Generalized anxiety disorder: Secondary | ICD-10-CM

## 2011-09-24 DIAGNOSIS — M25519 Pain in unspecified shoulder: Secondary | ICD-10-CM

## 2011-09-24 DIAGNOSIS — I10 Essential (primary) hypertension: Secondary | ICD-10-CM

## 2011-09-24 DIAGNOSIS — N189 Chronic kidney disease, unspecified: Secondary | ICD-10-CM

## 2011-09-24 DIAGNOSIS — R252 Cramp and spasm: Secondary | ICD-10-CM | POA: Insufficient documentation

## 2011-09-24 MED ORDER — CYCLOBENZAPRINE HCL 5 MG PO TABS: 5.0000 mg | ORAL_TABLET | Freq: Three times a day (TID) | ORAL | Status: AC | PRN

## 2011-09-24 MED ORDER — HYDROCODONE-ACETAMINOPHEN 10-325 MG PO TABS: 1.0000 | ORAL_TABLET | Freq: Four times a day (QID) | ORAL | Status: DC | PRN

## 2011-09-24 MED ORDER — CITALOPRAM HYDROBROMIDE 10 MG PO TABS: 10.0000 mg | ORAL_TABLET | Freq: Every day | ORAL | Status: DC

## 2011-09-24 MED ORDER — AMLODIPINE BESYLATE 5 MG PO TABS: 5.0000 mg | ORAL_TABLET | Freq: Every day | ORAL | Status: DC

## 2011-09-24 MED ORDER — ALPRAZOLAM 0.5 MG PO TABS: 0.5000 mg | ORAL_TABLET | Freq: Every day | ORAL | Status: DC

## 2011-09-24 NOTE — Assessment & Plan Note (Signed)
Mild uncontrolled, for amlod 5 qd, cont other meds,  to f/u any worsening symptoms or concerns

## 2011-09-24 NOTE — Assessment & Plan Note (Signed)
Ok for flexeril prn,  to f/u any worsening symptoms or concerns 

## 2011-09-24 NOTE — Assessment & Plan Note (Signed)
Mild to mod, to add citalopram 10, cont the alprazolam prn,  to f/u any worsening symptoms or concerns

## 2011-09-24 NOTE — Assessment & Plan Note (Signed)
stable overall by hx and exam, most recent data reviewed with pt, and pt to continue medical treatment as before Lab Results  Component Value Date   WBC 3.5* 09/20/2011   HGB 11.9 09/20/2011   HCT 36.2 09/20/2011   PLT 198 09/20/2011   GLUCOSE 88 09/20/2011   CHOL 121 04/02/2011   TRIG 102.0 04/02/2011   HDL 35.80* 04/02/2011   LDLDIRECT 141.9 05/02/2006   LDLCALC 65 04/02/2011   ALT 32 10/30/2010   AST 17 09/20/2011   NA 147* 09/20/2011   K 4.2 09/20/2011   CL 100 09/20/2011   CREATININE 1.6* 09/20/2011   BUN 20 09/20/2011   CO2 29 09/20/2011   TSH 2.830 03/04/2011   INR 1.13 05/23/2011

## 2011-09-24 NOTE — Patient Instructions (Addendum)
Take all new medications as prescribed  - the amlodipine 5 mg per day for blood pressure, and the citalopram 10 mg per day for nerves, and flexeril 5 mg as needed for cramps Continue all other medications as before Your alprazolam and pain medication were refilled today Please have the pharmacy call with any refills you may need. No further blood work needed today Please return in 6 mo with Lab testing done 3-5 days before

## 2011-09-24 NOTE — Progress Notes (Signed)
Subjective:    Patient ID: [name], female    DOB: Jul 15, 1940, 71 y.o.   MRN: [id number]  HPI  Here to f/u, overall doing well, has been cleared after recent CT with contrast for recurren malignancy;  Pt still quite nervous after this experience of CT, the contrast and f/u with oncology although Denies worsening depressive symptoms, suicidal ideation, or panic.  Does have significant other anxiety and the alprazolam not always effective enough.  Pt denies chest pain, increased sob or doe, wheezing, orthopnea, PND, increased LE swelling, palpitations, dizziness or syncope.   Pt denies polydipsia, polyuria.  Pt denies new neurological symptoms such as new headache, or facial or extremity weakness or numbness  Does also have occasional hand cramps ? Worse after the contrast,  No other new complaints.   Pt denies fever, wt loss, night sweats, loss of appetite, or other constitutional symptoms Past Medical History  Diagnosis Date  . Cancer     lung ca  . 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 HYPERLIPIDEMIA 11/18/2006  . HYPERKALEMIA 02/05/2008  . ANEMIA-IRON DEFICIENCY 09/22/2008  . Anemia of other chronic disease 02/05/2008  . ANXIETY 06/03/2008  . DEPRESSION 04/28/2009  . HYPERTENSION 11/18/2006  . PERICARDITIS 01/03/2007  . AORTIC STENOSIS/ INSUFFICIENCY, NON-RHEUMATIC 12/03/2008  . SINUSITIS- ACUTE-NOS 11/20/2007  . SINUSITIS, CHRONIC 02/05/2008  . ALLERGIC RHINITIS 11/18/2006  . PNEUMONIA 05/02/2007  . C O P D 06/27/2008  . Stricture and stenosis of esophagus 11/14/2008  . GERD 01/03/2007  . BARRETTS ESOPHAGUS 11/14/2008  . RENAL INSUFFICIENCY 02/05/2008  . DYSPEPSIA 03/23/2007  . PRURITUS 10/08/2008  . OSTEOARTHRITIS 11/18/2006  . OSTEOARTHRITIS, KNEE, LEFT 01/03/2007  . Cervicalgia 01/13/2009  . SPONDYLOSIS, CERVICAL, WITH RADICULOPATHY 01/13/2009  . BACK PAIN 02/04/2009  . BURSITIS, LEFT HIP 04/18/2009  . OSTEOPOROSIS 11/18/2006  . INSOMNIA-SLEEP DISORDER-UNSPEC 06/03/2008  . FATIGUE  04/18/2009  . PERIPHERAL EDEMA 02/13/2009  . MURMUR 11/18/2006  . DYSPHAGIA UNSPECIFIED 03/23/2007  . Abdominal pain, generalized 03/01/2007  . Nonspecific (abnormal) findings on radiological and other examination of body structure 06/03/2008  . Tuberculin Test Reaction 11/18/2006  . Acute bronchitis 02/23/2010  . CHOLELITHIASIS 04/24/2010  . NEPHROLITHIASIS, HX OF 04/24/2010  . Polyarthralgia 06/09/2010  . Elevated sed rate 06/11/2010  . Positive ANA (antinuclear antibody) 06/11/2010  . Rheumatoid factor positive 06/11/2010  . Cervical radiculopathy 09/28/2010  . Lumbar radiculopathy 09/28/2010   Past Surgical History  Procedure Date  . Abdominal hysterectomy   . Tubal ligation   . Tumor removal 08/06/08    from lungs  . Anterior cervical decomp/discectomy fusion 01/15/2011    Procedure: ANTERIOR CERVICAL DECOMPRESSION/DISCECTOMY FUSION 2 LEVELS;  Surgeon: Kathaleen Maser Pool;  Location: [facility];  Service: Neurosurgery;  Laterality: N/A;  Cervical Three-Four, Cervical Four-Five Anterior Cervical Decompression Fusion   . Colonscopy     reports that she quit smoking about 5 years ago. Her smoking use included Cigarettes. She has a 60 pack-year smoking history. She has quit using smokeless tobacco. She reports that she does not drink alcohol or use illicit drugs. family history includes Coronary artery disease in her other; Heart disease in her father; Hyperlipidemia in her other; and Hypertension in her other.  There is no history of Colon cancer. Allergies  Allergen Reactions  . Amoxicillin Itching  . Fentanyl Other (See Comments)    hallucinations  . Hydrocodone Other (See Comments)    ineffective  . Codeine Hives and Rash  . Penicillins Hives and Rash  Review of Systems Review of Systems  Constitutional: Negative for diaphoresis and unexpected weight change.  HENT: Negative for tinnitus.   Eyes: Negative for photophobia and visual disturbance.  Respiratory: Negative for choking and stridor.     Gastrointestinal: Negative for vomiting and blood in stool.  Genitourinary: Negative for hematuria and decreased urine volume.  Musculoskeletal: Negative for gait problem.  Skin: Negative for color change and wound.  Neurological: Negative for tremors and numbness.  Psychiatric/Behavioral: Negative for decreased concentration. The patient is not hyperactive.      Objective:   Physical Exam BP 140/68  Pulse 66  Temp 97.6 F (36.4 C) (Oral)  Ht 5\' 2"  (1.575 m)  Wt 155 lb (70.308 kg)  BMI 28.35 kg/m2  SpO2 95% Physical Exam  VS noted Constitutional: Pt appears well-developed and well-nourished.  HENT: Head: Normocephalic.  Right Ear: External ear normal.  Left Ear: External ear normal.  Eyes: Conjunctivae and EOM are normal. Pupils are equal, round, and reactive to light.  Neck: Normal range of motion. Neck supple.  Cardiovascular: Normal rate and regular rhythm.   Pulmonary/Chest: Effort normal and breath sounds normal.  Neurological: Pt is alert. Not confused overall, but does have some confusion about her meds, though daughter helps.  Skin: Skin is warm. No erythema. No rash Psychiatric: Pt behavior is normal. Thought content normal. 1+ nervous, not depressed affect    Assessment & Plan:

## 2011-10-11 ENCOUNTER — Ambulatory Visit: Payer: Medicare Other | Admitting: Gastroenterology

## 2011-10-13 ENCOUNTER — Ambulatory Visit (INDEPENDENT_AMBULATORY_CARE_PROVIDER_SITE_OTHER): Payer: Medicare Other | Admitting: Gastroenterology

## 2011-10-13 ENCOUNTER — Encounter: Payer: Self-pay | Admitting: Gastroenterology

## 2011-10-13 VITALS — BP 138/60 | HR 68 | Ht 61.0 in | Wt 161.0 lb

## 2011-10-13 DIAGNOSIS — R1310 Dysphagia, unspecified: Secondary | ICD-10-CM

## 2011-10-13 DIAGNOSIS — K2270 Barrett's esophagus without dysplasia: Secondary | ICD-10-CM

## 2011-10-13 NOTE — Assessment & Plan Note (Signed)
Recurrent dysphagia likely is 2 to a recurrent distal esophageal stricture. I think symptoms are unlikely related to her recent discectomy in the cervical spine.  Recommendations #1 upper endoscopy with dilatation as indicated

## 2011-10-13 NOTE — Assessment & Plan Note (Addendum)
Barrett's esophagus diagnosed 2009. Followup endoscopy one year later did not demonstrate changes. Plan to repeat biopsies if there is any endoscopic finding suggestive of Barrett's esophagus

## 2011-10-13 NOTE — Progress Notes (Signed)
History of Present Illness: Pleasant 71 year old Afro-American female with history of lung cancer, esophageal stricture, and Barrett's esophagus referred for evaluation of dysphagia. Approximately 9 months ago she underwent a decompression discectomy of the cervical spine. Over the past 2 months she's noted difficulty swallowing pills requires fluids to wash it down. She denies dysphagia to solids, per se. There is no history of pyrosis. Last esophageal dilatation was 2009. Recent screening colonoscopy demonstrated diverticulosis.    Past Medical History  Diagnosis Date  . Cancer     lung ca  . 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 HYPERLIPIDEMIA 11/18/2006  . HYPERKALEMIA 02/05/2008  . ANEMIA-IRON DEFICIENCY 09/22/2008  . Anemia of other chronic disease 02/05/2008  . ANXIETY 06/03/2008  . DEPRESSION 04/28/2009  . HYPERTENSION 11/18/2006  . PERICARDITIS 01/03/2007  . AORTIC STENOSIS/ INSUFFICIENCY, NON-RHEUMATIC 12/03/2008  . SINUSITIS- ACUTE-NOS 11/20/2007  . SINUSITIS, CHRONIC 02/05/2008  . ALLERGIC RHINITIS 11/18/2006  . PNEUMONIA 05/02/2007  . C O P D 06/27/2008  . Stricture and stenosis of esophagus 11/14/2008  . GERD 01/03/2007  . BARRETTS ESOPHAGUS 11/14/2008  . RENAL INSUFFICIENCY 02/05/2008  . DYSPEPSIA 03/23/2007  . PRURITUS 10/08/2008  . OSTEOARTHRITIS 11/18/2006  . OSTEOARTHRITIS, KNEE, LEFT 01/03/2007  . Cervicalgia 01/13/2009  . SPONDYLOSIS, CERVICAL, WITH RADICULOPATHY 01/13/2009  . BACK PAIN 02/04/2009  . BURSITIS, LEFT HIP 04/18/2009  . OSTEOPOROSIS 11/18/2006  . INSOMNIA-SLEEP DISORDER-UNSPEC 06/03/2008  . FATIGUE 04/18/2009  . PERIPHERAL EDEMA 02/13/2009  . MURMUR 11/18/2006  . DYSPHAGIA UNSPECIFIED 03/23/2007  . Abdominal pain, generalized 03/01/2007  . Nonspecific (abnormal) findings on radiological and other examination of body structure 06/03/2008  . Tuberculin Test Reaction 11/18/2006  . Acute bronchitis 02/23/2010  . CHOLELITHIASIS 04/24/2010  . NEPHROLITHIASIS, HX OF  04/24/2010  . Polyarthralgia 06/09/2010  . Elevated sed rate 06/11/2010  . Positive ANA (antinuclear antibody) 06/11/2010  . Rheumatoid factor positive 06/11/2010  . Cervical radiculopathy 09/28/2010  . Lumbar radiculopathy 09/28/2010   Past Surgical History  Procedure Date  . Abdominal hysterectomy   . Tubal ligation   . Tumor removal 08/06/08    from lungs  . Anterior cervical decomp/discectomy fusion 01/15/2011    Procedure: ANTERIOR CERVICAL DECOMPRESSION/DISCECTOMY FUSION 2 LEVELS;  Surgeon: Kathaleen Maser Pool;  Location: [facility];  Service: Neurosurgery;  Laterality: N/A;  Cervical Three-Four, Cervical Four-Five Anterior Cervical Decompression Fusion   . Colonscopy    family history includes Coronary artery disease in her other; Heart disease in her father; Hyperlipidemia in her other; and Hypertension in her other.  There is no history of Colon cancer. Current Outpatient Prescriptions  Medication Sig Dispense Refill  . albuterol (PROVENTIL HFA;VENTOLIN HFA) 108 (90 BASE) MCG/ACT inhaler Inhale 1-2 puffs into the lungs 4 (four) times daily as needed. For wheezing or shortness of breath.      Marland Kitchen alendronate (FOSAMAX) 70 MG tablet Take 70 mg by mouth every 7 (seven) days. Take with a full glass of water on an empty stomach. Patient takes on Mondays.       . ALPRAZolam (XANAX) 0.5 MG tablet Take 1 tablet (0.5 mg total) by mouth daily. As needed  30 tablet  5  . amLODipine (NORVASC) 5 MG tablet Take 1 tablet (5 mg total) by mouth daily.  90 tablet  3  . Cholecalciferol (VITAMIN D3) 1000 UNITS CAPS Take 1 capsule by mouth daily.       . citalopram (CELEXA) 10 MG tablet Take 1 tablet (10 mg total) by mouth daily.  90  tablet  3  . furosemide (LASIX) 40 MG tablet Take 40 mg by mouth daily.      Marland Kitchen HYDROcodone-acetaminophen (NORCO) 10-325 MG per tablet Take 1 tablet by mouth every 6 (six) hours as needed for pain.  120 tablet  2  . metoprolol succinate (TOPROL-XL) 25 MG 24 hr tablet Take 25 mg by mouth  daily.      . pantoprazole (PROTONIX) 40 MG tablet Take 1 tablet (40 mg total) by mouth 2 (two) times daily.  180 tablet  3  . pravastatin (PRAVACHOL) 40 MG tablet Take 40 mg by mouth daily.      Marland Kitchen zolpidem (AMBIEN) 5 MG tablet Take 1 tablet (5 mg total) by mouth at bedtime as needed. For sleep.  30 tablet  5   Allergies as of 10/13/2011 - Review Complete 10/13/2011  Allergen Reaction Noted  . Amoxicillin Itching   . Fentanyl Other (See Comments) 09/18/2008  . Hydrocodone Other (See Comments)   . Codeine Hives and Rash   . Penicillins Hives and Rash     reports that she quit smoking about 5 years ago. Her smoking use included Cigarettes. She has a 60 pack-year smoking history. She has quit using smokeless tobacco. She reports that she does not drink alcohol or use illicit drugs.     Review of Systems: Pertinent positive and negative review of systems were noted in the above HPI section. All other review of systems were otherwise negative.  Vital signs were reviewed in today's medical record Physical Exam: General: Well developed , well nourished, no acute distress Head: Normocephalic and atraumatic Eyes:  sclerae anicteric, EOMI Ears: Normal auditory acuity Mouth: No deformity or lesions Neck: Supple, no masses or thyromegaly Lungs: Clear throughout to auscultation Heart: Regular rate and rhythm; no murmurs, rubs or bruits Abdomen: Soft, non tender and non distended. No masses, hepatosplenomegaly or hernias noted. Normal Bowel sounds Rectal:deferred Musculoskeletal: Symmetrical with no gross deformities  Skin: No lesions on visible extremities Pulses:  Normal pulses noted Extremities: No clubbing, cyanosis, edema or deformities noted Neurological: Alert oriented x 4, grossly nonfocal Cervical Nodes:  No significant cervical adenopathy Inguinal Nodes: No significant inguinal adenopathy Psychological:  Alert and cooperative. Normal mood and affect

## 2011-10-13 NOTE — Patient Instructions (Addendum)
Your Endoscopy is scheduled on 10/14/2011 at 8:00am  Bring all inhalers with you to your appointment You stated you are not allergic to any eggs or have a soy allergy Separate instructions have been given

## 2011-10-14 ENCOUNTER — Ambulatory Visit (AMBULATORY_SURGERY_CENTER): Payer: Medicare Other | Admitting: Gastroenterology

## 2011-10-14 ENCOUNTER — Encounter: Payer: Self-pay | Admitting: Gastroenterology

## 2011-10-14 VITALS — BP 134/57 | HR 60 | Temp 97.5°F | Resp 16 | Ht 61.0 in | Wt 161.0 lb

## 2011-10-14 DIAGNOSIS — R1310 Dysphagia, unspecified: Secondary | ICD-10-CM

## 2011-10-14 DIAGNOSIS — C3490 Malignant neoplasm of unspecified part of unspecified bronchus or lung: Secondary | ICD-10-CM

## 2011-10-14 DIAGNOSIS — K2270 Barrett's esophagus without dysplasia: Secondary | ICD-10-CM

## 2011-10-14 MED ORDER — SODIUM CHLORIDE 0.9 % IV SOLN: 500.0000 mL | INTRAVENOUS | Status: DC

## 2011-10-14 NOTE — Patient Instructions (Addendum)
YOU HAD AN ENDOSCOPIC PROCEDURE TODAY AT THE [facility]: Refer to the procedure report that was given to you for any specific questions about what was found during the examination.  If the procedure report does not answer your questions, please call your gastroenterologist to clarify.  If you requested that your care partner not be given the details of your procedure findings, then the procedure report has been included in a sealed envelope for you to review at your convenience later.  YOU SHOULD EXPECT: Some feelings of bloating in the abdomen. Passage of more gas than usual.  Walking can help get rid of the air that was put into your GI tract during the procedure and reduce the bloating. If you had a lower endoscopy (such as a colonoscopy or flexible sigmoidoscopy) you may notice spotting of blood in your stool or on the toilet paper. If you underwent a bowel prep for your procedure, then you may not have a normal bowel movement for a few days.  DIET: Your first meal following the procedure should be a light meal and then it is ok to progress to your normal diet.  A half-sandwich or bowl of soup is an example of a good first meal.  Heavy or fried foods are harder to digest and may make you feel nauseous or bloated.  Likewise meals heavy in dairy and vegetables can cause extra gas to form and this can also increase the bloating.  Drink plenty of fluids but you should avoid alcoholic beverages for 24 hours.  ACTIVITY: Your care partner should take you home directly after the procedure.  You should plan to take it easy, moving slowly for the rest of the day.  You can resume normal activity the day after the procedure however you should NOT DRIVE or use heavy machinery for 24 hours (because of the sedation medicines used during the test).    SYMPTOMS TO REPORT IMMEDIATELY: A gastroenterologist can be reached at any hour.  During normal business hours, 8:30 AM to 5:00 PM Monday through Friday,  call [phone].  After hours and on weekends, please call the GI answering service at [phone] who will take a message and have the physician on call contact you.  Following upper endoscopy (EGD)  Vomiting of blood or coffee ground material  New chest pain or pain under the shoulder blades  Painful or persistently difficult swallowing  New shortness of breath  Fever of 100F or higher  Black, tarry-looking stools  FOLLOW UP: If any biopsies were taken you will be contacted by phone or by letter within the next 1-3 weeks.  Call your gastroenterologist if you have not heard about the biopsies in 3 weeks.  Our staff will call the home number listed on your records the next business day following your procedure to check on you and address any questions or concerns that you may have at that time regarding the information given to you following your procedure. This is a courtesy call and so if there is no answer at the home number and we have not heard from you through the emergency physician on call, we will assume that you have returned to your regular daily activities without incident.  SIGNATURES/CONFIDENTIALITY: You and/or your care partner have signed paperwork which will be entered into your electronic medical record.  These signatures attest to the fact that that the information above on your After Visit Summary has been reviewed and is understood.  Full responsibility of   the confidentiality of this discharge information lies with you and/or your care-partner. 

## 2011-10-14 NOTE — Progress Notes (Signed)
Patient did not have preoperative order for IV antibiotic SSI prophylaxis. (G8918)  Patient did not experience any of the following events: a burn prior to discharge; a fall within the facility; wrong site/side/patient/procedure/implant event; or a hospital transfer or hospital admission upon discharge from the facility. (G8907)  

## 2011-10-14 NOTE — Op Note (Signed)
[facility] [address] [place], [address]   ENDOSCOPY PROCEDURE REPORT  PATIENT: Jody, [name]  MR#: [id number] BIRTHDATE: [birth year] , 71  yrs. old GENDER: Female ENDOSCOPIST: Louis Meckel, MD REFERRED BY: PROCEDURE DATE:  10/14/2011 PROCEDURE:  EGD, diagnostic and Maloney dilation of esophagus ASA CLASS:     Class II INDICATIONS:  dysphagia. MEDICATIONS: MAC sedation, administered by CRNA, Propofol (Diprivan) 60 mg IV, and Simethicone 0.6cc PO TOPICAL ANESTHETIC:  DESCRIPTION OF PROCEDURE: After the risks benefits and alternatives of the procedure were thoroughly explained, informed consent was obtained.  The LB GIF-H180 [id number] endoscope was introduced through the mouth and advanced to the third portion of the duodenum. Without limitations.  The instrument was slowly withdrawn as the mucosa was fully examined.    The upper, middle and distal third of the esophagus were carefully inspected and no abnormalities were noted.  The z-line was well seen at the GEJ.  The endoscope was pushed into the fundus which was normal including a retroflexed view.  The antrum, gastric body, first and second part of the duodenum were unremarkable.   Because of dysphagia, a 52Fr maloney dilator was passed with minimal resistance.  There was no heme.  Retroflexed views revealed no abnormalities.     The scope was then withdrawn from the patient and the procedure completed.  COMPLICATIONS: There were no complications.  ENDOSCOPIC IMPRESSION: Normal EGD - s/p maloney dilitation  RECOMMENDATIONS: Call to scheule a follow-up appointment with office 1 month(s)  REPEAT EXAM:  eSigned:  Louis Meckel, MD 10/14/2011 9:02 AM   CC:

## 2011-10-14 NOTE — Progress Notes (Signed)
Pt needs to remove her upper & lower denture. Maw

## 2011-10-15 ENCOUNTER — Telehealth: Payer: Self-pay

## 2011-10-15 NOTE — Telephone Encounter (Signed)
Left message on answering machine. 

## 2011-11-04 ENCOUNTER — Ambulatory Visit: Payer: Medicare Other | Admitting: Internal Medicine

## 2011-11-10 ENCOUNTER — Encounter: Payer: Self-pay | Admitting: Internal Medicine

## 2011-11-10 ENCOUNTER — Encounter: Payer: Self-pay | Admitting: Gastroenterology

## 2011-11-10 ENCOUNTER — Ambulatory Visit (INDEPENDENT_AMBULATORY_CARE_PROVIDER_SITE_OTHER): Payer: Medicare Other | Admitting: Internal Medicine

## 2011-11-10 ENCOUNTER — Ambulatory Visit (INDEPENDENT_AMBULATORY_CARE_PROVIDER_SITE_OTHER): Payer: Medicare Other | Admitting: Gastroenterology

## 2011-11-10 VITALS — BP 138/70 | HR 69 | Temp 97.4°F | Ht 61.0 in | Wt 163.1 lb

## 2011-11-10 VITALS — BP 136/54 | HR 60 | Ht 61.0 in | Wt 163.8 lb

## 2011-11-10 DIAGNOSIS — I10 Essential (primary) hypertension: Secondary | ICD-10-CM

## 2011-11-10 DIAGNOSIS — J019 Acute sinusitis, unspecified: Secondary | ICD-10-CM

## 2011-11-10 DIAGNOSIS — J449 Chronic obstructive pulmonary disease, unspecified: Secondary | ICD-10-CM

## 2011-11-10 DIAGNOSIS — R1310 Dysphagia, unspecified: Secondary | ICD-10-CM

## 2011-11-10 DIAGNOSIS — R05 Cough: Secondary | ICD-10-CM

## 2011-11-10 DIAGNOSIS — J4489 Other specified chronic obstructive pulmonary disease: Secondary | ICD-10-CM

## 2011-11-10 DIAGNOSIS — R059 Cough, unspecified: Secondary | ICD-10-CM

## 2011-11-10 DIAGNOSIS — K2270 Barrett's esophagus without dysplasia: Secondary | ICD-10-CM

## 2011-11-10 MED ORDER — AZITHROMYCIN 250 MG PO TABS: ORAL_TABLET | ORAL | Status: DC

## 2011-11-10 NOTE — Assessment & Plan Note (Signed)
Resolved following repeat dilatation. Plan repeat when necessary

## 2011-11-10 NOTE — Assessment & Plan Note (Signed)
Plan followup endoscopy in 3 years 

## 2011-11-10 NOTE — Patient Instructions (Signed)
Follow up as needed

## 2011-11-10 NOTE — Progress Notes (Signed)
History of Present Illness:  [name] has returned following upper endoscopy with dilatation. No frank stricture was seen. She was dilated to 18 mm with a Maloney dilator and reports complete resolution of dysphagia.    Review of Systems: She has difficulty clearing her throat and will sometimes cough up some blood-tinged sputum Pertinent positive and negative review of systems were noted in the above HPI section. All other review of systems were otherwise negative.    Current Medications, Allergies, Past Medical History, Past Surgical History, Family History and Social History were reviewed in [employer] electronic medical record  Vital signs were reviewed in today's medical record. Physical Exam: General: Well developed , well nourished, no acute distress

## 2011-11-10 NOTE — Patient Instructions (Addendum)
Take all new medications as prescribed Continue all other medications as before You can also take  Mucinex (or it's generic off brand) for congestion  

## 2011-11-10 NOTE — Assessment & Plan Note (Signed)
While she does not have any cough, per se, she has difficulty clearing her throat and has seen some blood tinged sputum.  Medications #1 trial of Mucinex

## 2011-11-13 ENCOUNTER — Encounter: Payer: Self-pay | Admitting: Internal Medicine

## 2011-11-13 NOTE — Progress Notes (Signed)
Subjective:    Patient ID: TEXAS ZOGG, female    DOB: [birth year], 71 y.o.   MRN: [id number]  HPI   Here with 3 days acute onset fever, facial pain, pressure, general weakness and malaise, and greenish d/c, with slight ST, but little to no cough and Pt denies chest pain, increased sob or doe, wheezing, orthopnea, PND, increased LE swelling, palpitations, dizziness or syncope.  Pt denies polydipsia, polyuria. Pt denies new neurological symptoms such as new headache, or facial or extremity weakness or numbness  No other acute complaints. Denies worsening depressive symptoms, suicidal ideation, or panic. Past Medical History  Diagnosis Date  . HYPERLIPIDEMIA 11/18/2006  . HYPERKALEMIA 02/05/2008  . ANEMIA-IRON DEFICIENCY 09/22/2008  . Anemia of other chronic disease 02/05/2008  . ANXIETY 06/03/2008  . DEPRESSION 04/28/2009  . HYPERTENSION 11/18/2006  . PERICARDITIS 01/03/2007  . AORTIC STENOSIS/ INSUFFICIENCY, NON-RHEUMATIC 12/03/2008  . SINUSITIS- ACUTE-NOS 11/20/2007  . SINUSITIS, CHRONIC 02/05/2008  . ALLERGIC RHINITIS 11/18/2006  . PNEUMONIA 05/02/2007  . C O P D 06/27/2008  . Stricture and stenosis of esophagus 11/14/2008  . GERD 01/03/2007  . BARRETTS ESOPHAGUS 11/14/2008  . RENAL INSUFFICIENCY 02/05/2008  . DYSPEPSIA 03/23/2007  . PRURITUS 10/08/2008  . OSTEOARTHRITIS 11/18/2006  . OSTEOARTHRITIS, KNEE, LEFT 01/03/2007  . Cervicalgia 01/13/2009  . BACK PAIN 02/04/2009  . BURSITIS, LEFT HIP 04/18/2009  . OSTEOPOROSIS 11/18/2006  . INSOMNIA-SLEEP DISORDER-UNSPEC 06/03/2008  . FATIGUE 04/18/2009  . PERIPHERAL EDEMA 02/13/2009  . MURMUR 11/18/2006  . DYSPHAGIA UNSPECIFIED 03/23/2007  . Abdominal pain, generalized 03/01/2007  . Nonspecific (abnormal) findings on radiological and other examination of body structure 06/03/2008  . Tuberculin Test Reaction 11/18/2006  . Acute bronchitis 02/23/2010  . CHOLELITHIASIS 04/24/2010  . NEPHROLITHIASIS, HX OF 04/24/2010  . Polyarthralgia 06/09/2010  . Elevated sed  rate 06/11/2010  . Positive ANA (antinuclear antibody) 06/11/2010  . Rheumatoid factor positive 06/11/2010  . Cancer     lung ca  . 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 SPONDYLOSIS, CERVICAL, WITH RADICULOPATHY 01/13/2009  . Cervical radiculopathy 09/28/2010  . Lumbar radiculopathy 09/28/2010   Past Surgical History  Procedure Date  . Abdominal hysterectomy   . Tubal ligation   . Tumor removal 08/06/08    from lungs  . Anterior cervical decomp/discectomy fusion 01/15/2011    Procedure: ANTERIOR CERVICAL DECOMPRESSION/DISCECTOMY FUSION 2 LEVELS;  Surgeon: Kathaleen Maser Pool;  Location: [facility];  Service: Neurosurgery;  Laterality: N/A;  Cervical Three-Four, Cervical Four-Five Anterior Cervical Decompression Fusion   . Colonscopy   . Nose surgery     reports that she quit smoking about 5 years ago. Her smoking use included Cigarettes. She has a 60 pack-year smoking history. She has quit using smokeless tobacco. She reports that she does not drink alcohol or use illicit drugs. family history includes Coronary artery disease in her other; Heart disease in her father; Hyperlipidemia in her other; and Hypertension in her other.  There is no history of Colon cancer, and Esophageal cancer, and Rectal cancer, and Stomach cancer, . Allergies  Allergen Reactions  . Amoxicillin Itching  . Fentanyl Other (See Comments)    hallucinations  . Hydrocodone Other (See Comments)    ineffective  . Codeine Hives and Rash  . Penicillins Hives and Rash   Current Outpatient Prescriptions on File Prior to Visit  Medication Sig Dispense Refill  . albuterol (PROVENTIL HFA;VENTOLIN HFA) 108 (90 BASE) MCG/ACT inhaler Inhale 1-2 puffs into the lungs 4 (four) times daily as needed.  For wheezing or shortness of breath.      Marland Kitchen alendronate (FOSAMAX) 70 MG tablet Take 70 mg by mouth every 7 (seven) days. Take with a full glass of water on an empty stomach. Patient takes on Mondays.       . ALPRAZolam (XANAX) 0.5 MG  tablet Take 1 tablet (0.5 mg total) by mouth daily. As needed  30 tablet  5  . amLODipine (NORVASC) 5 MG tablet Take 1 tablet (5 mg total) by mouth daily.  90 tablet  3  . Cholecalciferol (VITAMIN D3) 1000 UNITS CAPS Take 1 capsule by mouth daily.       . citalopram (CELEXA) 10 MG tablet Take 1 tablet (10 mg total) by mouth daily.  90 tablet  3  . furosemide (LASIX) 40 MG tablet Take 40 mg by mouth daily.      Marland Kitchen HYDROcodone-acetaminophen (NORCO) 10-325 MG per tablet Take 1 tablet by mouth every 6 (six) hours as needed for pain.  120 tablet  2  . metoprolol succinate (TOPROL-XL) 25 MG 24 hr tablet Take 25 mg by mouth daily.      . pantoprazole (PROTONIX) 40 MG tablet Take 1 tablet (40 mg total) by mouth 2 (two) times daily.  180 tablet  3  . pravastatin (PRAVACHOL) 40 MG tablet Take 40 mg by mouth daily.      Marland Kitchen zolpidem (AMBIEN) 5 MG tablet Take 1 tablet (5 mg total) by mouth at bedtime as needed. For sleep.  30 tablet  5   Review of Systems  Constitutional: Negative for diaphoresis and unexpected weight change.  HENT: Negative for tinnitus.   Eyes: Negative for photophobia and visual disturbance.  Respiratory: Negative for choking and stridor.   Gastrointestinal: Negative for vomiting and blood in stool.  Genitourinary: Negative for hematuria and decreased urine volume.  Musculoskeletal: Negative for gait problem.  Skin: Negative for color change and wound.  Neurological: Negative for tremors and numbness.  Psychiatric/Behavioral: Negative for decreased concentration. The patient is not hyperactive.       Objective:   Physical Exam BP 138/70  Pulse 69  Temp 97.4 F (36.3 C) (Oral)  Ht 5\' 1"  (1.549 m)  Wt 163 lb 2 oz (73.993 kg)  BMI 30.82 kg/m2  SpO2 93% Physical Exam  VS noted, mild ill Constitutional: Pt appears well-developed and well-nourished.  HENT: Head: Normocephalic.  Right Ear: External ear normal.  Left Ear: External ear normal.  Bilat tm's mild erythema.  Sinus  tender.  Pharynx mild erythema Eyes: Conjunctivae and EOM are normal. Pupils are equal, round, and reactive to light.  Neck: Normal range of motion. Neck supple.  Cardiovascular: Normal rate and regular rhythm.   Pulmonary/Chest: Effort normal and breath sounds normal. - no rales or wheezing Neurological: Pt is alert. Not confused  Skin: Skin is warm. No erythema.  Psychiatric: Pt behavior is normal. Thought content normal.     Assessment & Plan:

## 2011-11-13 NOTE — Assessment & Plan Note (Signed)
stable overall by hx and exam, most recent data reviewed with pt, and pt to continue medical treatment as before BP Readings from Last 3 Encounters:  11/10/11 138/70  11/10/11 136/54  10/14/11 134/57

## 2011-11-13 NOTE — Assessment & Plan Note (Signed)
Mild to mod, for antibx course,  to f/u any worsening symptoms or concerns 

## 2011-11-13 NOTE — Assessment & Plan Note (Signed)
stable overall by hx and exam, most recent data reviewed with pt, and pt to continue medical treatment as before SpO2 Readings from Last 3 Encounters:  11/10/11 93%  10/14/11 93%  09/24/11 95%

## 2011-12-02 ENCOUNTER — Other Ambulatory Visit: Payer: Self-pay

## 2011-12-02 MED ORDER — PRAVASTATIN SODIUM 40 MG PO TABS: 40.0000 mg | ORAL_TABLET | Freq: Every day | ORAL | Status: DC

## 2011-12-02 MED ORDER — ZOLPIDEM TARTRATE 5 MG PO TABS: 5.0000 mg | ORAL_TABLET | Freq: Every evening | ORAL | Status: DC | PRN

## 2011-12-02 MED ORDER — ALENDRONATE SODIUM 70 MG PO TABS: 70.0000 mg | ORAL_TABLET | ORAL | Status: DC

## 2011-12-02 MED ORDER — FUROSEMIDE 40 MG PO TABS: 40.0000 mg | ORAL_TABLET | Freq: Every day | ORAL | Status: DC

## 2011-12-02 NOTE — Telephone Encounter (Signed)
Faxed hardcopy to pharmacy. 

## 2011-12-02 NOTE — Telephone Encounter (Signed)
Done hardcopy to robin  

## 2011-12-22 ENCOUNTER — Ambulatory Visit (INDEPENDENT_AMBULATORY_CARE_PROVIDER_SITE_OTHER): Payer: Medicare Other | Admitting: Internal Medicine

## 2011-12-22 ENCOUNTER — Encounter: Payer: Self-pay | Admitting: Internal Medicine

## 2011-12-22 VITALS — BP 108/60 | HR 59 | Temp 97.3°F | Ht 62.0 in | Wt 166.5 lb

## 2011-12-22 DIAGNOSIS — J449 Chronic obstructive pulmonary disease, unspecified: Secondary | ICD-10-CM

## 2011-12-22 DIAGNOSIS — L0390 Cellulitis, unspecified: Secondary | ICD-10-CM

## 2011-12-22 DIAGNOSIS — I10 Essential (primary) hypertension: Secondary | ICD-10-CM

## 2011-12-22 MED ORDER — DOXYCYCLINE HYCLATE 100 MG PO TABS: 100.0000 mg | ORAL_TABLET | Freq: Two times a day (BID) | ORAL | Status: DC

## 2011-12-22 MED ORDER — HYDROCODONE-ACETAMINOPHEN 10-325 MG PO TABS: 1.0000 | ORAL_TABLET | Freq: Four times a day (QID) | ORAL | Status: DC | PRN

## 2011-12-22 NOTE — Progress Notes (Signed)
Subjective:    Patient ID: [name], female    DOB: [birth year], 71 y.o.   MRN: [id number]  HPI  Here after hitting the right lateral ankle on furniture 2 days ago, has been using ice but the pain/red/swelling now worse, more pain to walk though doesnot need cane or walker, no falls and limps to walk but does not think she will fall.   No fever, red streas, ulcer or drainage.  Not better with triple antibx or peroxide.  Pain about 4/10 at rest, not really worse to walk.  Nothing else makes better or worse.  Pt denies chest pain, increased sob or doe, wheezing, orthopnea, PND, increased LE swelling, palpitations, dizziness or syncope.   Pt denies polydipsia, polyuria Past Medical History  Diagnosis Date  . HYPERLIPIDEMIA 11/18/2006  . HYPERKALEMIA 02/05/2008  . ANEMIA-IRON DEFICIENCY 09/22/2008  . Anemia of other chronic disease 02/05/2008  . ANXIETY 06/03/2008  . DEPRESSION 04/28/2009  . HYPERTENSION 11/18/2006  . PERICARDITIS 01/03/2007  . AORTIC STENOSIS/ INSUFFICIENCY, NON-RHEUMATIC 12/03/2008  . SINUSITIS- ACUTE-NOS 11/20/2007  . SINUSITIS, CHRONIC 02/05/2008  . ALLERGIC RHINITIS 11/18/2006  . PNEUMONIA 05/02/2007  . C O P D 06/27/2008  . Stricture and stenosis of esophagus 11/14/2008  . GERD 01/03/2007  . BARRETTS ESOPHAGUS 11/14/2008  . RENAL INSUFFICIENCY 02/05/2008  . DYSPEPSIA 03/23/2007  . PRURITUS 10/08/2008  . OSTEOARTHRITIS 11/18/2006  . OSTEOARTHRITIS, KNEE, LEFT 01/03/2007  . Cervicalgia 01/13/2009  . BACK PAIN 02/04/2009  . BURSITIS, LEFT HIP 04/18/2009  . OSTEOPOROSIS 11/18/2006  . INSOMNIA-SLEEP DISORDER-UNSPEC 06/03/2008  . FATIGUE 04/18/2009  . PERIPHERAL EDEMA 02/13/2009  . MURMUR 11/18/2006  . DYSPHAGIA UNSPECIFIED 03/23/2007  . Abdominal pain, generalized 03/01/2007  . Nonspecific (abnormal) findings on radiological and other examination of body structure 06/03/2008  . Tuberculin Test Reaction 11/18/2006  . Acute bronchitis 02/23/2010  . CHOLELITHIASIS 04/24/2010  .  NEPHROLITHIASIS, HX OF 04/24/2010  . Polyarthralgia 06/09/2010  . Elevated sed rate 06/11/2010  . Positive ANA (antinuclear antibody) 06/11/2010  . Rheumatoid factor positive 06/11/2010  . Cancer     lung ca  . 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 SPONDYLOSIS, CERVICAL, WITH RADICULOPATHY 01/13/2009  . Cervical radiculopathy 09/28/2010  . Lumbar radiculopathy 09/28/2010   Past Surgical History  Procedure Date  . Abdominal hysterectomy   . Tubal ligation   . Tumor removal 08/06/08    from lungs  . Anterior cervical decomp/discectomy fusion 01/15/2011    Procedure: ANTERIOR CERVICAL DECOMPRESSION/DISCECTOMY FUSION 2 LEVELS;  Surgeon: Kathaleen Maser Pool;  Location: [facility];  Service: Neurosurgery;  Laterality: N/A;  Cervical Three-Four, Cervical Four-Five Anterior Cervical Decompression Fusion   . Colonscopy   . Nose surgery     reports that she quit smoking about 5 years ago. Her smoking use included Cigarettes. She has a 60 pack-year smoking history. She has quit using smokeless tobacco. She reports that she does not drink alcohol or use illicit drugs. family history includes Coronary artery disease in her other; Heart disease in her father; Hyperlipidemia in her other; and Hypertension in her other.  There is no history of Colon cancer, and Esophageal cancer, and Rectal cancer, and Stomach cancer, . Allergies  Allergen Reactions  . Amoxicillin Itching  . Fentanyl Other (See Comments)    hallucinations  . Hydrocodone Other (See Comments)    ineffective  . Codeine Hives and Rash  . Penicillins Hives and Rash   Current Outpatient Prescriptions on File Prior to Visit  Medication Sig  Dispense Refill  . albuterol (PROVENTIL HFA;VENTOLIN HFA) 108 (90 BASE) MCG/ACT inhaler Inhale 1-2 puffs into the lungs 4 (four) times daily as needed. For wheezing or shortness of breath.      Marland Kitchen alendronate (FOSAMAX) 70 MG tablet Take 1 tablet (70 mg total) by mouth every 7 (seven) days. Take with a full  glass of water on an empty stomach. Patient takes on Mondays.  4 tablet  11  . ALPRAZolam (XANAX) 0.5 MG tablet Take 1 tablet (0.5 mg total) by mouth daily. As needed  30 tablet  5  . amLODipine (NORVASC) 5 MG tablet Take 1 tablet (5 mg total) by mouth daily.  90 tablet  3  . Cholecalciferol (VITAMIN D3) 1000 UNITS CAPS Take 1 capsule by mouth daily.       . citalopram (CELEXA) 10 MG tablet Take 1 tablet (10 mg total) by mouth daily.  90 tablet  3  . furosemide (LASIX) 40 MG tablet Take 1 tablet (40 mg total) by mouth daily.  90 tablet  3  . metoprolol succinate (TOPROL-XL) 25 MG 24 hr tablet Take 25 mg by mouth daily.      . pantoprazole (PROTONIX) 40 MG tablet Take 1 tablet (40 mg total) by mouth 2 (two) times daily.  180 tablet  3  . pravastatin (PRAVACHOL) 40 MG tablet Take 1 tablet (40 mg total) by mouth daily.  90 tablet  3  . zolpidem (AMBIEN) 5 MG tablet Take 1 tablet (5 mg total) by mouth at bedtime as needed. For sleep.  30 tablet  5   Review of Systems  Constitutional: Negative for diaphoresis and unexpected weight change.  HENT: Negative for tinnitus.   Eyes: Negative for photophobia and visual disturbance.  Respiratory: Negative for choking and stridor.   Gastrointestinal: Negative for vomiting and blood in stool.  Genitourinary: Negative for hematuria and decreased urine volume.  Musculoskeletal: Negative for gait problem.  Skin: Negative for color change and wound.  Neurological: Negative for tremors and numbness.  Psychiatric/Behavioral: Negative for decreased concentration. The patient is not hyperactive.       Objective:   Physical Exam BP 108/60  Pulse 59  Temp 97.3 F (36.3 C) (Oral)  Ht 5\' 2"  (1.575 m)  Wt 166 lb 8 oz (75.524 kg)  BMI 30.45 kg/m2  SpO2 94% Physical Exam  VS noted Constitutional: Pt appears well-developed and well-nourished.  HENT: Head: Normocephalic.  Right Ear: External ear normal.  Left Ear: External ear normal.  Eyes: Conjunctivae and  EOM are normal. Pupils are equal, round, and reactive to light.  Neck: Normal range of motion. Neck supple.  Cardiovascular: Normal rate and regular rhythm.   Pulmonary/Chest: Effort normal and breath sounds normal.  Neurological: Pt is alert. Not confused  Skin: right lateral malleous ankle with 2+ red, tender, swelling with small 5 mm wound superficial, no fluctuance or drainage, no red streaks, no right groin LA Psychiatric: Pt behavior is normal. Thought content normal.     Assessment & Plan:

## 2011-12-22 NOTE — Assessment & Plan Note (Signed)
Mild to mod, for antibx course,  to f/u any worsening symptoms or concerns 

## 2011-12-22 NOTE — Patient Instructions (Addendum)
Take all new medications as prescribed Continue all other medications as before  

## 2011-12-22 NOTE — Assessment & Plan Note (Signed)
stable overall by hx and exam, most recent data reviewed with pt, and pt to continue medical treatment as before BP Readings from Last 3 Encounters:  12/22/11 108/60  11/10/11 138/70  11/10/11 136/54

## 2011-12-22 NOTE — Assessment & Plan Note (Signed)
stable overall by hx and exam, most recent data reviewed with pt, and pt to continue medical treatment as before SpO2 Readings from Last 3 Encounters:  12/22/11 94%  11/10/11 93%  10/14/11 93%

## 2011-12-27 ENCOUNTER — Ambulatory Visit: Payer: Medicare Other | Admitting: Adult Health

## 2011-12-29 ENCOUNTER — Encounter: Payer: Self-pay | Admitting: Adult Health

## 2011-12-29 ENCOUNTER — Encounter: Payer: Self-pay | Admitting: Internal Medicine

## 2011-12-29 ENCOUNTER — Ambulatory Visit (INDEPENDENT_AMBULATORY_CARE_PROVIDER_SITE_OTHER): Payer: Medicare Other | Admitting: Adult Health

## 2011-12-29 ENCOUNTER — Ambulatory Visit (INDEPENDENT_AMBULATORY_CARE_PROVIDER_SITE_OTHER): Payer: Medicare Other | Admitting: Internal Medicine

## 2011-12-29 VITALS — BP 114/62 | HR 68 | Temp 97.6°F | Ht 62.0 in | Wt 170.4 lb

## 2011-12-29 VITALS — BP 110/56 | HR 63 | Temp 97.0°F | Ht 62.0 in | Wt 172.0 lb

## 2011-12-29 DIAGNOSIS — I10 Essential (primary) hypertension: Secondary | ICD-10-CM

## 2011-12-29 DIAGNOSIS — R609 Edema, unspecified: Secondary | ICD-10-CM

## 2011-12-29 DIAGNOSIS — I059 Rheumatic mitral valve disease, unspecified: Secondary | ICD-10-CM

## 2011-12-29 DIAGNOSIS — I340 Nonrheumatic mitral (valve) insufficiency: Secondary | ICD-10-CM

## 2011-12-29 DIAGNOSIS — J449 Chronic obstructive pulmonary disease, unspecified: Secondary | ICD-10-CM

## 2011-12-29 MED ORDER — FUROSEMIDE 40 MG PO TABS: 40.0000 mg | ORAL_TABLET | Freq: Two times a day (BID) | ORAL | Status: DC

## 2011-12-29 NOTE — Patient Instructions (Addendum)
Begin Symbicort 160/4.45mcg 2 puffs Twice daily  , brush/rinse and gargle after use.  follow up Dr. Vassie Loll  In 3 months and As needed

## 2011-12-29 NOTE — Assessment & Plan Note (Signed)
Moderate by echo oct 2012 - for f/u echo given the worsening leg edema (no pulm edema though) and f/u Dr Clifton Zorina Mallin

## 2011-12-29 NOTE — Patient Instructions (Addendum)
Please increase the furosemide (lasix) to 40 mg twice per day Please return in 1 wk for LAB only - the "BMET"  You will be contacted by phone if any changes need to be made immediately.  Otherwise, you will receive a letter about your results with an explanation. Please remember to sign up for My Chart at your earliest convenience, as this will be important to you in the future with finding out test results. You will be contacted regarding the referral for: echocardiogram, and Dr McAlhany/cardiology Please buy or borrow a weight scale for your home if you dont have one;  You need to weigh yourself first thing in the morning after using the bathroom in your underwear, with the scale on a hard flat surface (like the bathroom tile floor);  You should not lose more than about 1 pound per day with the increase in the furosemide;  Please stop by for a weight check on our scale in one week when you come back for the blood test

## 2011-12-29 NOTE — Assessment & Plan Note (Signed)
Seems most likely related to CKD and fluid retention, o/w  most recent data reviewed with pt (aug 2013 blood tests), and pt to increase the lasix to 40 bid, with bmet/wt check in 1 wk Lab Results  Component Value Date   WBC 3.5* 09/20/2011   HGB 11.9 09/20/2011   HCT 36.2 09/20/2011   PLT 198 09/20/2011   GLUCOSE 88 09/20/2011   CHOL 121 04/02/2011   TRIG 102.0 04/02/2011   HDL 35.80* 04/02/2011   LDLDIRECT 141.9 05/02/2006   LDLCALC 65 04/02/2011   ALT 32 10/30/2010   AST 17 09/20/2011   NA 147* 09/20/2011   K 4.2 09/20/2011   CL 100 09/20/2011   CREATININE 1.6* 09/20/2011   BUN 20 09/20/2011   CO2 29 09/20/2011   TSH 2.830 03/04/2011   INR 1.13 05/23/2011

## 2011-12-31 NOTE — Progress Notes (Signed)
  Subjective:    Patient ID: [name], female    DOB: 08-01-1940, 71 y.o.   MRN: [id number]  HPI 71/F, ex smoker for FU of COPD  5/10 s/p LUL obectomy for stg I a squamous cell CA  She smoked 58yrs until 2008.  Pre-op FEV1 was 1.53 (80%) & DLCO 48%  CT 03/24/09 no recurrence  Off Spiriva since 2/11 since very expensive.   Jun 30, 2009 - Rpt PFTs >> FEv1 66%, FVC 63%, DLCO 44%   5 /24/2012 Ct jan'12 reviewed  Stopped spiriva   06/25/11   Acute OV 3/13  Complains of persistent  congestion and sinus pressure x2-3days. Pt saw PCP ,  tx w/abx  Levaquin.Marland Kitchen  Cough and congestion are not going away.  - much improved with antibiotic Had c spine surgery,  CT chest 8/12 stable nodule in prevascular space  12/29/11 Follow up  6 month follow up COPD  - reports breathing is doing well but has more DOE at times and occasional cough-dry .  Wants to try another inhaler. Previously on Spiriva but stopped due to expense .  , reports some occasional hoarseness and PND.   on doxycycline and norco for cellulitis , more leg swelling over last couple of weeks.  Has ov with PCP today .  No hemoptysis, chest pain,  fever .  Increased SABA use for last few months.  CT chest 09/2011 No significant change. Stable postsurgical changes volume loss  left hemithorax. Mild emphysematous changes right upper lobe.  Stable vague nodularity and stranding in the prevascular space without change in appearance from prior exam.     Review of Systems Constitutional:   No  weight loss, night sweats,  Fevers, chills, fatigue, or  lassitude.  HEENT:   No headaches,  Difficulty swallowing,  Tooth/dental problems, or  Sore throat,                No sneezing, itching, ear ache, nasal congestion, post nasal drip,   CV:  No chest pain,  Orthopnea, PND, , anasarca, dizziness, palpitations, syncope.   GI  No heartburn, indigestion, abdominal pain, nausea, vomiting, diarrhea, change in bowel habits, loss of appetite, bloody  stools.   Resp:   No excess mucus, no productive cough,  No non-productive cough,  No coughing up of blood.  No change in color of mucus.  No wheezing.  No chest wall deformity     GU: no dysuria, change in color of urine, no urgency or frequency.  No flank pain, no hematuria   MS:  No joint pain or swelling.  No decreased range of motion.  No back pain.  Psych:  No change in mood or affect. No depression or anxiety.  No memory loss.          Objective:   Physical Exam   Gen. Pleasant, well-nourished, in no distress ENT - no lesions, no post nasal drip Neck: No JVD, no thyromegaly, no carotid bruits Lungs: no use of accessory muscles, no dullness to percussion, clear without rales or rhonchi  Cardiovascular: Rhythm regular, heart sounds  normal, no murmurs or gallops  1+ peripheral edema Musculoskeletal: No deformities, no cyanosis or clubbing         Assessment & Plan:

## 2011-12-31 NOTE — Assessment & Plan Note (Signed)
CT 09/2011 w/ stable changes  Cont follow up with Oncology as planned

## 2011-12-31 NOTE — Assessment & Plan Note (Signed)
Symptomatic Mild COPD  Trial of LABA/ICS   Plan Begin Symbicort 160/4.15mcg 2 puffs Twice daily  , brush/rinse and gargle after use.  follow up Dr. Vassie Loll  In 3 months and As needed

## 2012-01-02 ENCOUNTER — Encounter: Payer: Self-pay | Admitting: Internal Medicine

## 2012-01-02 NOTE — Progress Notes (Signed)
Subjective:    Patient ID: [name], female    DOB: 02-14-1941, 71 y.o.   MRN: [id number]  HPI  Here to f/u; overall doing ok,  Pt denies chest pain, increased sob or doe, wheezing, orthopnea, PND, increased LE swelling, palpitations, dizziness or syncope, though has worsening LE edema in the past 2 wks.  Pt denies new neurological symptoms such as new headache, or facial or extremity weakness or numbness   Pt denies polydipsia, polyuria, or low sugar symptoms such as weakness or confusion improved with po intake.  Pt states overall good compliance with meds, trying to follow lower cholesterol diet, wt overall stable but little exercise however.   Pt denies fever, wt loss, night sweats, loss of appetite, or other constitutional symptoms  Overall good compliance with treatment, and good medicine tolerability, including the lasix 40 qd. Most recent echo 2012 with mod MR, EF normal, mild AI Past Medical History  Diagnosis Date  . HYPERLIPIDEMIA 11/18/2006  . HYPERKALEMIA 02/05/2008  . ANEMIA-IRON DEFICIENCY 09/22/2008  . Anemia of other chronic disease 02/05/2008  . ANXIETY 06/03/2008  . DEPRESSION 04/28/2009  . HYPERTENSION 11/18/2006  . PERICARDITIS 01/03/2007  . AORTIC STENOSIS/ INSUFFICIENCY, NON-RHEUMATIC 12/03/2008  . SINUSITIS- ACUTE-NOS 11/20/2007  . SINUSITIS, CHRONIC 02/05/2008  . ALLERGIC RHINITIS 11/18/2006  . PNEUMONIA 05/02/2007  . C O P D 06/27/2008  . Stricture and stenosis of esophagus 11/14/2008  . GERD 01/03/2007  . BARRETTS ESOPHAGUS 11/14/2008  . RENAL INSUFFICIENCY 02/05/2008  . DYSPEPSIA 03/23/2007  . PRURITUS 10/08/2008  . OSTEOARTHRITIS 11/18/2006  . OSTEOARTHRITIS, KNEE, LEFT 01/03/2007  . Cervicalgia 01/13/2009  . BACK PAIN 02/04/2009  . BURSITIS, LEFT HIP 04/18/2009  . OSTEOPOROSIS 11/18/2006  . INSOMNIA-SLEEP DISORDER-UNSPEC 06/03/2008  . FATIGUE 04/18/2009  . PERIPHERAL EDEMA 02/13/2009  . MURMUR 11/18/2006  . DYSPHAGIA UNSPECIFIED 03/23/2007  . Abdominal pain,  generalized 03/01/2007  . Nonspecific (abnormal) findings on radiological and other examination of body structure 06/03/2008  . Tuberculin Test Reaction 11/18/2006  . Acute bronchitis 02/23/2010  . CHOLELITHIASIS 04/24/2010  . NEPHROLITHIASIS, HX OF 04/24/2010  . Polyarthralgia 06/09/2010  . Elevated sed rate 06/11/2010  . Positive ANA (antinuclear antibody) 06/11/2010  . Rheumatoid factor positive 06/11/2010  . Cancer     lung ca  . 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 SPONDYLOSIS, CERVICAL, WITH RADICULOPATHY 01/13/2009  . Cervical radiculopathy 09/28/2010  . Lumbar radiculopathy 09/28/2010   Past Surgical History  Procedure Date  . Abdominal hysterectomy   . Tubal ligation   . Tumor removal 08/06/08    from lungs  . Anterior cervical decomp/discectomy fusion 01/15/2011    Procedure: ANTERIOR CERVICAL DECOMPRESSION/DISCECTOMY FUSION 2 LEVELS;  Surgeon: Kathaleen Maser Pool;  Location: [facility];  Service: Neurosurgery;  Laterality: N/A;  Cervical Three-Four, Cervical Four-Five Anterior Cervical Decompression Fusion   . Colonscopy   . Nose surgery     reports that she quit smoking about 5 years ago. Her smoking use included Cigarettes. She has a 60 pack-year smoking history. She has quit using smokeless tobacco. She reports that she does not drink alcohol or use illicit drugs. family history includes Coronary artery disease in her other; Heart disease in her father; Hyperlipidemia in her other; and Hypertension in her other.  There is no history of Colon cancer, and Esophageal cancer, and Rectal cancer, and Stomach cancer, . Allergies  Allergen Reactions  . Amoxicillin Itching  . Fentanyl Other (See Comments)    hallucinations  . Hydrocodone Other (See  Comments)    ineffective  . Codeine Hives and Rash  . Penicillins Hives and Rash   Current Outpatient Prescriptions on File Prior to Visit  Medication Sig Dispense Refill  . albuterol (PROVENTIL HFA;VENTOLIN HFA) 108 (90 BASE) MCG/ACT  inhaler Inhale 1-2 puffs into the lungs 4 (four) times daily as needed. For wheezing or shortness of breath.      Marland Kitchen alendronate (FOSAMAX) 70 MG tablet Take 1 tablet (70 mg total) by mouth every 7 (seven) days. Take with a full glass of water on an empty stomach. Patient takes on Mondays.  4 tablet  11  . ALPRAZolam (XANAX) 0.5 MG tablet Take 1 tablet (0.5 mg total) by mouth daily. As needed  30 tablet  5  . amLODipine (NORVASC) 5 MG tablet Take 1 tablet (5 mg total) by mouth daily.  90 tablet  3  . Cholecalciferol (VITAMIN D3) 1000 UNITS CAPS Take 1 capsule by mouth daily.       . citalopram (CELEXA) 10 MG tablet Take 1 tablet (10 mg total) by mouth daily.  90 tablet  3  . furosemide (LASIX) 40 MG tablet Take 1 tablet (40 mg total) by mouth 2 (two) times daily.  180 tablet  3  . HYDROcodone-acetaminophen (NORCO) 10-325 MG per tablet Take 1 tablet by mouth every 6 (six) hours as needed for pain.  120 tablet  1  . metoprolol succinate (TOPROL-XL) 25 MG 24 hr tablet Take 25 mg by mouth daily.      . pantoprazole (PROTONIX) 40 MG tablet Take 1 tablet (40 mg total) by mouth 2 (two) times daily.  180 tablet  3  . pravastatin (PRAVACHOL) 40 MG tablet Take 1 tablet (40 mg total) by mouth daily.  90 tablet  3  . zolpidem (AMBIEN) 5 MG tablet Take 1 tablet (5 mg total) by mouth at bedtime as needed. For sleep.  30 tablet  5   Review of Systems  Constitutional: Negative for diaphoresis and unexpected weight change.  HENT: Negative for tinnitus.   Eyes: Negative for photophobia and visual disturbance.  Respiratory: Negative for choking and stridor.   Gastrointestinal: Negative for vomiting and blood in stool.  Genitourinary: Negative for hematuria and decreased urine volume.  Musculoskeletal: Negative for gait problem.  Skin: Negative for color change and wound.  Neurological: Negative for tremors and numbness.  Psychiatric/Behavioral: Negative for decreased concentration. The patient is not hyperactive.        Objective:   Physical Exam BP 114/62  Pulse 68  Temp 97.6 F (36.4 C) (Oral)  Ht 5\' 2"  (1.575 m)  Wt 170 lb 6 oz (77.282 kg)  BMI 31.16 kg/m2  SpO2 90% Physical Exam  VS noted Constitutional: Pt appears well-developed and well-nourished.  HENT: Head: Normocephalic.  Right Ear: External ear normal.  Left Ear: External ear normal.  Eyes: Conjunctivae and EOM are normal. Pupils are equal, round, and reactive to light.  Neck: Normal range of motion. Neck supple.  Cardiovascular: Normal rate and regular rhythm.   Pulmonary/Chest: Effort normal and breath sounds normal.  Abd:  Soft, NT, non-distended, + BS Neurological: Pt is alert. Not confused  Skin: Skin is warm. No erythema. Bilat LE edema 1-2+ to just below knees Psychiatric: Pt behavior is normal. Thought content normal.     Assessment & Plan:

## 2012-01-02 NOTE — Assessment & Plan Note (Signed)
stable overall by hx and exam, most recent data reviewed with pt, and pt to continue medical treatment as before BP Readings from Last 3 Encounters:  12/29/11 114/62  12/29/11 110/56  12/22/11 108/60

## 2012-01-05 ENCOUNTER — Ambulatory Visit (INDEPENDENT_AMBULATORY_CARE_PROVIDER_SITE_OTHER): Payer: Medicare Other | Admitting: Internal Medicine

## 2012-01-05 ENCOUNTER — Encounter: Payer: Self-pay | Admitting: Internal Medicine

## 2012-01-05 ENCOUNTER — Other Ambulatory Visit (INDEPENDENT_AMBULATORY_CARE_PROVIDER_SITE_OTHER): Payer: Medicare Other

## 2012-01-05 VITALS — BP 112/76 | HR 64 | Temp 98.2°F | Ht 62.0 in | Wt 159.1 lb

## 2012-01-05 DIAGNOSIS — F411 Generalized anxiety disorder: Secondary | ICD-10-CM

## 2012-01-05 DIAGNOSIS — I10 Essential (primary) hypertension: Secondary | ICD-10-CM

## 2012-01-05 DIAGNOSIS — R609 Edema, unspecified: Secondary | ICD-10-CM

## 2012-01-05 LAB — BASIC METABOLIC PANEL
BUN: 40 mg/dL — ABNORMAL HIGH (ref 6–23)
Calcium: 9.4 mg/dL (ref 8.4–10.5)
GFR: 37.29 mL/min — ABNORMAL LOW (ref >60.00)
Glucose, Bld: 101 mg/dL — ABNORMAL HIGH (ref 70–99)

## 2012-01-05 MED ORDER — FUROSEMIDE 40 MG PO TABS: ORAL_TABLET | ORAL | Status: DC

## 2012-01-05 NOTE — Patient Instructions (Addendum)
Your swelling has improved, so for now we can say take the furosemide 40 mg in the AM every day, but only take the afternoon furosemide (lasix) if you see the swelling coming back, and your weight has gone up 1-2 lbs Continue all other medications as before Please have the pharmacy call with any other refills you may need. Your blood work was already drawn this morning You will be contacted by phone if any changes need to be made immediately.  Otherwise, you will receive a letter about your results with an explanation, but please check with MyChart first. Thank you for enrolling in MyChart. Please follow the instructions below to securely access your online medical record. MyChart allows you to send messages to your doctor, view your test results, renew your prescriptions, schedule appointments, and more. To Log into MyChart, please go to [email or web address], and your Username is: [name] Please keep your appointments with your specialists as you have planned

## 2012-01-05 NOTE — Progress Notes (Signed)
Subjective:    Patient ID: [name], female    DOB: 13-Jan-1941, 71 y.o.   MRN: [id number]  HPI  Here to f/u, overall doing ok, had BMET drawn this am, wt at home down from 170 to 150 but not clear her scale at home is accurate;  Overall feels much improved, Pt denies chest pain, increased sob or doe, wheezing, orthopnea, PND, increased LE swelling, palpitations, dizziness or syncope, and LE edema has resolved,  Denies orthostasis, cramping or other side effect,  Still has some achiness to the RLE but nonexertional no further evidence for infection.   Pt denies fever, wt loss, night sweats, loss of appetite, or other constitutional symptoms.  Pt denies new neurological symptoms such as new headache, or facial or extremity weakness or numbness  Denies worsening depressive symptoms, suicidal ideation, or panic  Echo ordered. Past Medical History  Diagnosis Date  . HYPERLIPIDEMIA 11/18/2006  . HYPERKALEMIA 02/05/2008  . ANEMIA-IRON DEFICIENCY 09/22/2008  . Anemia of other chronic disease 02/05/2008  . ANXIETY 06/03/2008  . DEPRESSION 04/28/2009  . HYPERTENSION 11/18/2006  . PERICARDITIS 01/03/2007  . AORTIC STENOSIS/ INSUFFICIENCY, NON-RHEUMATIC 12/03/2008  . SINUSITIS- ACUTE-NOS 11/20/2007  . SINUSITIS, CHRONIC 02/05/2008  . ALLERGIC RHINITIS 11/18/2006  . PNEUMONIA 05/02/2007  . C O P D 06/27/2008  . Stricture and stenosis of esophagus 11/14/2008  . GERD 01/03/2007  . BARRETTS ESOPHAGUS 11/14/2008  . RENAL INSUFFICIENCY 02/05/2008  . DYSPEPSIA 03/23/2007  . PRURITUS 10/08/2008  . OSTEOARTHRITIS 11/18/2006  . OSTEOARTHRITIS, KNEE, LEFT 01/03/2007  . Cervicalgia 01/13/2009  . BACK PAIN 02/04/2009  . BURSITIS, LEFT HIP 04/18/2009  . OSTEOPOROSIS 11/18/2006  . INSOMNIA-SLEEP DISORDER-UNSPEC 06/03/2008  . FATIGUE 04/18/2009  . PERIPHERAL EDEMA 02/13/2009  . MURMUR 11/18/2006  . DYSPHAGIA UNSPECIFIED 03/23/2007  . Abdominal pain, generalized 03/01/2007  . Nonspecific (abnormal) findings on radiological and  other examination of body structure 06/03/2008  . Tuberculin Test Reaction 11/18/2006  . Acute bronchitis 02/23/2010  . CHOLELITHIASIS 04/24/2010  . NEPHROLITHIASIS, HX OF 04/24/2010  . Polyarthralgia 06/09/2010  . Elevated sed rate 06/11/2010  . Positive ANA (antinuclear antibody) 06/11/2010  . Rheumatoid factor positive 06/11/2010  . Cancer     lung ca  . 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 SPONDYLOSIS, CERVICAL, WITH RADICULOPATHY 01/13/2009  . Cervical radiculopathy 09/28/2010  . Lumbar radiculopathy 09/28/2010   Past Surgical History  Procedure Date  . Abdominal hysterectomy   . Tubal ligation   . Tumor removal 08/06/08    from lungs  . Anterior cervical decomp/discectomy fusion 01/15/2011    Procedure: ANTERIOR CERVICAL DECOMPRESSION/DISCECTOMY FUSION 2 LEVELS;  Surgeon: Kathaleen Maser Pool;  Location: [facility];  Service: Neurosurgery;  Laterality: N/A;  Cervical Three-Four, Cervical Four-Five Anterior Cervical Decompression Fusion   . Colonscopy   . Nose surgery     reports that she quit smoking about 5 years ago. Her smoking use included Cigarettes. She has a 60 pack-year smoking history. She has quit using smokeless tobacco. She reports that she does not drink alcohol or use illicit drugs. family history includes Coronary artery disease in her other; Heart disease in her father; Hyperlipidemia in her other; and Hypertension in her other.  There is no history of Colon cancer, and Esophageal cancer, and Rectal cancer, and Stomach cancer, . Allergies  Allergen Reactions  . Amoxicillin Itching  . Fentanyl Other (See Comments)    hallucinations  . Hydrocodone Other (See Comments)    ineffective  . Codeine Hives and  Rash  . Penicillins Hives and Rash   Current Outpatient Prescriptions on File Prior to Visit  Medication Sig Dispense Refill  . albuterol (PROVENTIL HFA;VENTOLIN HFA) 108 (90 BASE) MCG/ACT inhaler Inhale 1-2 puffs into the lungs 4 (four) times daily as needed. For  wheezing or shortness of breath.      Marland Kitchen alendronate (FOSAMAX) 70 MG tablet Take 1 tablet (70 mg total) by mouth every 7 (seven) days. Take with a full glass of water on an empty stomach. Patient takes on Mondays.  4 tablet  11  . ALPRAZolam (XANAX) 0.5 MG tablet Take 1 tablet (0.5 mg total) by mouth daily. As needed  30 tablet  5  . amLODipine (NORVASC) 5 MG tablet Take 1 tablet (5 mg total) by mouth daily.  90 tablet  3  . Cholecalciferol (VITAMIN D3) 1000 UNITS CAPS Take 1 capsule by mouth daily.       . citalopram (CELEXA) 10 MG tablet Take 1 tablet (10 mg total) by mouth daily.  90 tablet  3  . HYDROcodone-acetaminophen (NORCO) 10-325 MG per tablet Take 1 tablet by mouth every 6 (six) hours as needed for pain.  120 tablet  1  . metoprolol succinate (TOPROL-XL) 25 MG 24 hr tablet Take 25 mg by mouth daily.      . pantoprazole (PROTONIX) 40 MG tablet Take 1 tablet (40 mg total) by mouth 2 (two) times daily.  180 tablet  3  . pravastatin (PRAVACHOL) 40 MG tablet Take 1 tablet (40 mg total) by mouth daily.  90 tablet  3  . zolpidem (AMBIEN) 5 MG tablet Take 1 tablet (5 mg total) by mouth at bedtime as needed. For sleep.  30 tablet  5  . [DISCONTINUED] furosemide (LASIX) 40 MG tablet Take 1 tablet (40 mg total) by mouth 2 (two) times daily.  180 tablet  3   Review of Systems  Constitutional: Negative for diaphoresis and unexpected weight change.  HENT: Negative for tinnitus.   Eyes: Negative for photophobia and visual disturbance.  Respiratory: Negative for choking and stridor.   Gastrointestinal: Negative for vomiting and blood in stool.  Genitourinary: Negative for hematuria and decreased urine volume.  Musculoskeletal: Negative for gait problem.  Skin: Negative for color change and wound.  Neurological: Negative for tremors and numbness.  Psychiatric/Behavioral: Negative for decreased concentration. The patient is not hyperactive.       Objective:   Physical Exam BP 112/76  Pulse 64   Temp 98.2 F (36.8 C) (Oral)  Ht 5\' 2"  (1.575 m)  Wt 159 lb 2 oz (72.179 kg)  BMI 29.10 kg/m2  SpO2 97% Physical Exam  VS noted Constitutional: Pt appears well-developed and well-nourished.  HENT: Head: Normocephalic.  Right Ear: External ear normal.  Left Ear: External ear normal.  Eyes: Conjunctivae and EOM are normal. Pupils are equal, round, and reactive to light.  Neck: Normal range of motion. Neck supple.  Cardiovascular: Normal rate and regular rhythm.   Pulmonary/Chest: Effort normal and breath sounds normal.  Neurological: Pt is alert. Not confused  Skin: Skin is warm. No erythema. No LE edema Psychiatric: Pt behavior is normal. Thought content normal. not depressed affect, not nervous    Assessment & Plan:

## 2012-01-05 NOTE — Assessment & Plan Note (Signed)
Now euvolemic by exam, to cont lasix 40 in am, with prn afternoon dosing for recurrent swelling or wt increase, to f/u echo, card referral

## 2012-01-05 NOTE — Assessment & Plan Note (Signed)
stable overall by hx and exam, most recent data reviewed with pt, and pt to continue medical treatment as before BP Readings from Last 3 Encounters:  01/05/12 112/76  12/29/11 114/62  12/29/11 110/56

## 2012-01-05 NOTE — Assessment & Plan Note (Signed)
stable overall by hx and exam, most recent data reviewed with pt, and pt to continue medical treatment as before Lab Results  Component Value Date   WBC 3.5* 09/20/2011   HGB 11.9 09/20/2011   HCT 36.2 09/20/2011   PLT 198 09/20/2011   GLUCOSE 101* 01/05/2012   CHOL 121 04/02/2011   TRIG 102.0 04/02/2011   HDL 35.80* 04/02/2011   LDLDIRECT 141.9 05/02/2006   LDLCALC 65 04/02/2011   ALT 32 10/30/2010   AST 17 09/20/2011   NA 141 01/05/2012   K 3.6 01/05/2012   CL 101 01/05/2012   CREATININE 1.7* 01/05/2012   BUN 40* 01/05/2012   CO2 31 01/05/2012   TSH 2.830 03/04/2011   INR 1.13 05/23/2011

## 2012-01-11 ENCOUNTER — Ambulatory Visit (INDEPENDENT_AMBULATORY_CARE_PROVIDER_SITE_OTHER): Payer: Medicare Other | Admitting: Physician Assistant

## 2012-01-11 ENCOUNTER — Encounter: Payer: Self-pay | Admitting: Physician Assistant

## 2012-01-11 VITALS — BP 134/60 | HR 67 | Ht 62.0 in | Wt 163.0 lb

## 2012-01-11 DIAGNOSIS — I10 Essential (primary) hypertension: Secondary | ICD-10-CM

## 2012-01-11 DIAGNOSIS — I340 Nonrheumatic mitral (valve) insufficiency: Secondary | ICD-10-CM

## 2012-01-11 DIAGNOSIS — R609 Edema, unspecified: Secondary | ICD-10-CM

## 2012-01-11 DIAGNOSIS — I059 Rheumatic mitral valve disease, unspecified: Secondary | ICD-10-CM

## 2012-01-11 NOTE — Patient Instructions (Addendum)
Your physician recommends that you schedule a follow-up appointment after echo with Dr Clifton James  Your physician has requested that you have an echocardiogram. Echocardiography is a painless test that uses sound waves to create images of your heart. It provides your doctor with information about the size and shape of your heart and how well your heart's chambers and valves are working. This procedure takes approximately one hour. There are no restrictions for this procedure. (ORDER WAS PLACED BY DR Jonny Ruiz, PLEASE SCHEDULE)

## 2012-01-11 NOTE — Assessment & Plan Note (Signed)
Blood pressure stable ? ?

## 2012-01-11 NOTE — Assessment & Plan Note (Signed)
Patient had moderate MR mild AI her last 2-D echo in October 2012. We will repeat this to make sure there are any changes.

## 2012-01-11 NOTE — Progress Notes (Signed)
HPI:   This is a 71 year old African American female patient with history of hypertension, hyperlipidemia, pericarditis, and squamous cell carcinoma of the left upper lobe. She saw cardiology in May 2000 temperature plan thoracotomy for which she had in June 2010. Her echo prior to surgery showed normal LV function with mild diastolic dysfunction and mild AI. She also had a 9 beat run of V. Tach on a monitor in January 2013. Her last 2-D echo in October 2012 showed moderate MR and mild AI.  The patient states that she hurt her ankle by bumping it and then it began to swell. She had swelling in both ankles eventually and saw Dr. [name] who increased her Lasix to 40 mg in the morning and 40 in the afternoon for one week. The edema resolved and she only takes it p.r.n. Lasix in the afternoon now. He also ordered a 2-D echo that has not been done yet. She is here for further evaluation. She denies any chest pain, palpitations, dyspnea, dyspnea on exertion, dizziness, or presyncope. The patient seems convinced that her swelling was from bumping her ankle.  Allergies:  -- Amoxicillin -- Itching  -- Fentanyl -- Other (See Comments)   --  hallucinations  -- Hydrocodone -- Other (See Comments)   --  ineffective  -- Codeine -- Hives and Rash  -- Penicillins -- Hives and Rash  Current Outpatient Prescriptions on File Prior to Visit: albuterol (PROVENTIL HFA;VENTOLIN HFA) 108 (90 BASE) MCG/ACT inhaler, Inhale 1-2 puffs into the lungs 4 (four) times daily as needed. For wheezing or shortness of breath., Disp: , Rfl:  alendronate (FOSAMAX) 70 MG tablet, Take 1 tablet (70 mg total) by mouth every 7 (seven) days. Take with a full glass of water on an empty stomach. Patient takes on Mondays., Disp: 4 tablet, Rfl: 11 ALPRAZolam (XANAX) 0.5 MG tablet, Take 1 tablet (0.5 mg total) by mouth daily. As needed, Disp: 30 tablet, Rfl: 5 amLODipine (NORVASC) 5 MG tablet, Take 1 tablet (5 mg total) by mouth daily., Disp:  90 tablet, Rfl: 3 Cholecalciferol (VITAMIN D3) 1000 UNITS CAPS, Take 1 capsule by mouth daily. , Disp: , Rfl:  citalopram (CELEXA) 10 MG tablet, Take 1 tablet (10 mg total) by mouth daily., Disp: 90 tablet, Rfl: 3 furosemide (LASIX) 40 MG tablet, 1 tab by mouth in the Am daily, and 1 tab in the afternoon only for leg swelling recurrence, Disp: 180 tablet, Rfl: 3 HYDROcodone-acetaminophen (NORCO) 10-325 MG per tablet, Take 1 tablet by mouth every 6 (six) hours as needed for pain., Disp: 120 tablet, Rfl: 1 metoprolol succinate (TOPROL-XL) 25 MG 24 hr tablet, Take 25 mg by mouth daily., Disp: , Rfl:  pantoprazole (PROTONIX) 40 MG tablet, Take 1 tablet (40 mg total) by mouth 2 (two) times daily., Disp: 180 tablet, Rfl: 3 pravastatin (PRAVACHOL) 40 MG tablet, Take 1 tablet (40 mg total) by mouth daily., Disp: 90 tablet, Rfl: 3 zolpidem (AMBIEN) 5 MG tablet, Take 1 tablet (5 mg total) by mouth at bedtime as needed. For sleep., Disp: 30 tablet, Rfl: 5    Past Medical History:   HYPERLIPIDEMIA                                  11/18/2006    HYPERKALEMIA  02/05/2008   ANEMIA-IRON DEFICIENCY                          09/22/2008     Anemia of other chronic disease                 02/05/2008   ANXIETY                                         06/03/2008    DEPRESSION                                      04/28/2009    HYPERTENSION                                    11/18/2006    PERICARDITIS                                    01/03/2007   AORTIC STENOSIS/ INSUFFICIENCY, NON-RHEUMATIC   12/03/2008   SINUSITIS- ACUTE-NOS                            11/20/2007    SINUSITIS, CHRONIC                              02/05/2008   ALLERGIC RHINITIS                               11/18/2006    PNEUMONIA                                       05/02/2007    C O P D                                         06/27/2008    Stricture and stenosis of esophagus             11/14/2008    GERD                                             01/03/2007   BARRETTS ESOPHAGUS                              11/14/2008    RENAL INSUFFICIENCY                             02/05/2008   DYSPEPSIA  03/23/2007     PRURITUS                                        10/08/2008    OSTEOARTHRITIS                                  11/18/2006    OSTEOARTHRITIS, KNEE, LEFT                      01/03/2007   Cervicalgia                                     01/13/2009   BACK PAIN                                       02/04/2009   BURSITIS, LEFT HIP                              04/18/2009     OSTEOPOROSIS                                    11/18/2006    INSOMNIA-SLEEP DISORDER-UNSPEC                  06/03/2008    FATIGUE                                         04/18/2009     PERIPHERAL EDEMA                                02/13/2009   MURMUR                                          11/18/2006    DYSPHAGIA UNSPECIFIED                           03/23/2007     Abdominal pain, generalized                     03/01/2007    Nonspecific (abnormal) findings on radiologica* 06/03/2008    Tuberculin Test Reaction                        11/18/2006    Acute bronchitis                                02/23/2010     CHOLELITHIASIS                                  04/24/2010  NEPHROLITHIASIS, HX OF                          04/24/2010     Polyarthralgia                                  06/09/2010    Elevated sed rate                               06/11/2010    Positive ANA (antinuclear antibody)             06/11/2010    Rheumatoid factor positive                      06/11/2010    Cancer                                                         Comment:lung ca   CARCINOMA, LUNG, SQUAMOUS CELL                  06/23/2008     SPONDYLOSIS, CERVICAL, WITH RADICULOPATHY       01/13/2009   Cervical radiculopathy                          09/28/2010    Lumbar radiculopathy                            09/28/2010   Past Surgical History:    ABDOMINAL HYSTERECTOMY                                       TUBAL LIGATION                                               TUMOR REMOVAL                                   08/06/08        Comment:from lungs   ANTERIOR CERVICAL DECOMP/DISCECTOMY FUSION      01/15/2011     Comment:Procedure: ANTERIOR CERVICAL               DECOMPRESSION/DISCECTOMY FUSION 2 LEVELS;                Surgeon: Kathaleen Maser Pool;  Location: [facility];              Service: Neurosurgery;  Laterality: N/A;                Cervical Three-Four, Cervical Four-Five               Anterior Cervical Decompression Fusion    colonscopy  NOSE SURGERY                                                Review of patient's family history indicates:   Hyperlipidemia                 Other                    Hypertension                   Other                    Coronary artery disease        Other                      Comment: several nephrew   Heart disease                  Father                   Colon cancer                   Neg Hx                   Esophageal cancer              Neg Hx                   Rectal cancer                  Neg Hx                   Stomach cancer                 Neg Hx                   Social History   Marital Status: Widowed             Spouse Name:                      Years of Education:                 Number of children:             Occupational History   None on file  Social History Main Topics   Smoking Status: Former Smoker                   Packs/Day: 1.5   Years: 40        Types: Cigarettes     Quit date: 02/15/2006   Smokeless Status: Former Neurosurgeon                      Alcohol Use: No             Drug Use: No             Sexual Activity: Yes                    Birth Control/Protection: Post-menopausal  Other Topics            Concern   None on file  Social History Narrative  None on file    ROS:the history of present illness  otherwise negative   PHYSICAL EXAM: Well-nournished, in no acute distress. Neck:bilateral bruit versus murmur portrayed. No JVD, HJR,  or thyroid enlargement  Lungs:decreased breath sounds throughout but No tachypnea, clear without wheezing, rales, or rhonchi  Cardiovascular: RRR, PMI not displaced,2/6 systolic murmur at the apex, 1/6 diastolic murmur at the left sternal border, no gallops, bruit, thrill, or heave.  Abdomen: BS normal. Soft without organomegaly, masses, lesions or tenderness.  Extremities: without cyanosis, clubbing or edema. Good distal pulses bilateral  SKin: Warm, no lesions or rashes   Musculoskeletal: No deformities  Neuro: no focal signs  BP 134/60  Pulse 67  Ht 5\' 2"  (1.575 m)  Wt 163 lb (73.936 kg)  BMI 29.81 kg/m2  SpO2 97%   ZOX:WRUEAV sinus rhythm no acute change

## 2012-01-11 NOTE — Assessment & Plan Note (Signed)
Leg edema resolved with the extra Lasix for one week. We will check the 2-D echo and followup with her. She has no evidence of edema today and denies shortness of breath.

## 2012-01-18 ENCOUNTER — Ambulatory Visit (HOSPITAL_COMMUNITY): Payer: Medicare Other | Attending: Cardiovascular Disease

## 2012-01-18 DIAGNOSIS — E785 Hyperlipidemia, unspecified: Secondary | ICD-10-CM | POA: Insufficient documentation

## 2012-01-18 DIAGNOSIS — R609 Edema, unspecified: Secondary | ICD-10-CM | POA: Insufficient documentation

## 2012-01-18 DIAGNOSIS — I517 Cardiomegaly: Secondary | ICD-10-CM | POA: Insufficient documentation

## 2012-01-18 DIAGNOSIS — I059 Rheumatic mitral valve disease, unspecified: Secondary | ICD-10-CM | POA: Insufficient documentation

## 2012-01-18 DIAGNOSIS — I340 Nonrheumatic mitral (valve) insufficiency: Secondary | ICD-10-CM

## 2012-01-18 DIAGNOSIS — J4489 Other specified chronic obstructive pulmonary disease: Secondary | ICD-10-CM | POA: Insufficient documentation

## 2012-01-18 DIAGNOSIS — I379 Nonrheumatic pulmonary valve disorder, unspecified: Secondary | ICD-10-CM | POA: Insufficient documentation

## 2012-01-18 DIAGNOSIS — J449 Chronic obstructive pulmonary disease, unspecified: Secondary | ICD-10-CM | POA: Insufficient documentation

## 2012-01-18 DIAGNOSIS — Z87891 Personal history of nicotine dependence: Secondary | ICD-10-CM | POA: Insufficient documentation

## 2012-01-18 DIAGNOSIS — I359 Nonrheumatic aortic valve disorder, unspecified: Secondary | ICD-10-CM | POA: Insufficient documentation

## 2012-01-18 DIAGNOSIS — I079 Rheumatic tricuspid valve disease, unspecified: Secondary | ICD-10-CM | POA: Insufficient documentation

## 2012-01-18 DIAGNOSIS — I10 Essential (primary) hypertension: Secondary | ICD-10-CM | POA: Insufficient documentation

## 2012-01-18 NOTE — Progress Notes (Signed)
Echocardiogram performed.  

## 2012-01-25 ENCOUNTER — Encounter: Payer: Self-pay | Admitting: Cardiovascular Disease

## 2012-01-25 ENCOUNTER — Ambulatory Visit (INDEPENDENT_AMBULATORY_CARE_PROVIDER_SITE_OTHER): Payer: Medicare Other | Admitting: Cardiovascular Disease

## 2012-01-25 VITALS — BP 102/53 | HR 78 | Ht 62.0 in | Wt 167.0 lb

## 2012-01-25 DIAGNOSIS — I059 Rheumatic mitral valve disease, unspecified: Secondary | ICD-10-CM

## 2012-01-25 DIAGNOSIS — R002 Palpitations: Secondary | ICD-10-CM

## 2012-01-25 DIAGNOSIS — I340 Nonrheumatic mitral (valve) insufficiency: Secondary | ICD-10-CM

## 2012-01-25 MED ORDER — METOPROLOL SUCCINATE ER 25 MG PO TB24: 25.0000 mg | ORAL_TABLET | Freq: Every day | ORAL | Status: DC

## 2012-01-25 NOTE — Progress Notes (Signed)
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 She was referred in May of 2010 for cardiology risk assessment prior to planned thoracotomy which she had in June 2010. Her echo prior to the surgery showed normal LV function with mild diastolic dysfunction and mild AI. She was seen in the ED at [facility] October 28, 2010 for left flank pain. It was felt to be musculoskeletal. Cardiac enzymes were normal. She was seen by Norma Fredrickson, NP on 03/02/11 for c/o palpitations. An event monitor was placed and this showed PACs with short run 9 beat run of VT. Lower extremity arterial dopplers were normal February 2013. She had some lower extremity edema last month. Started on lasix in primary care with resolution of edema. Echo 01/18/12 with normal LV function. She was seen by Herma Carson, PA-C last month and was doing well.   She is here today for follow up. No chest pain, SOB. She feels well overall. Lower ext edema resolved.   Primary Care Physician: [name]  Last Lipid Profile:Lipid Panel     Component Value Date/Time   CHOL 121 04/02/2011 0846   TRIG 102.0 04/02/2011 0846   HDL 35.80* 04/02/2011 0846   CHOLHDL 3 04/02/2011 0846   VLDL 20.4 04/02/2011 0846   LDLCALC 65 04/02/2011 0846     Past Medical History  Diagnosis Date  . HYPERLIPIDEMIA 11/18/2006  . HYPERKALEMIA 02/05/2008  . ANEMIA-IRON DEFICIENCY 09/22/2008  . Anemia of other chronic disease 02/05/2008  . ANXIETY 06/03/2008  . DEPRESSION 04/28/2009  . HYPERTENSION 11/18/2006  . PERICARDITIS 01/03/2007  . AORTIC STENOSIS/ INSUFFICIENCY, NON-RHEUMATIC 12/03/2008  . SINUSITIS- ACUTE-NOS 11/20/2007  . SINUSITIS, CHRONIC 02/05/2008  . ALLERGIC RHINITIS 11/18/2006  . PNEUMONIA 05/02/2007  . C O P D 06/27/2008  . Stricture and stenosis of esophagus 11/14/2008  .  GERD 01/03/2007  . BARRETTS ESOPHAGUS 11/14/2008  . RENAL INSUFFICIENCY 02/05/2008  . DYSPEPSIA 03/23/2007  . PRURITUS 10/08/2008  . OSTEOARTHRITIS 11/18/2006  . OSTEOARTHRITIS, KNEE, LEFT 01/03/2007  . Cervicalgia 01/13/2009  . BACK PAIN 02/04/2009  . BURSITIS, LEFT HIP 04/18/2009  . OSTEOPOROSIS 11/18/2006  . INSOMNIA-SLEEP DISORDER-UNSPEC 06/03/2008  . FATIGUE 04/18/2009  . PERIPHERAL EDEMA 02/13/2009  . MURMUR 11/18/2006  . DYSPHAGIA UNSPECIFIED 03/23/2007  . Abdominal pain, generalized 03/01/2007  . Nonspecific (abnormal) findings on radiological and other examination of body structure 06/03/2008  . Tuberculin Test Reaction 11/18/2006  . Acute bronchitis 02/23/2010  . CHOLELITHIASIS 04/24/2010  . NEPHROLITHIASIS, HX OF 04/24/2010  . Polyarthralgia 06/09/2010  . Elevated sed rate 06/11/2010  . Positive ANA (antinuclear antibody) 06/11/2010  . Rheumatoid factor positive 06/11/2010  . Cancer     lung ca  . 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 SPONDYLOSIS, CERVICAL, WITH RADICULOPATHY 01/13/2009  . Cervical radiculopathy 09/28/2010  . Lumbar radiculopathy 09/28/2010    Past Surgical History  Procedure Date  . Abdominal hysterectomy   . Tubal ligation   . Tumor removal 08/06/08    from lungs  . Anterior cervical decomp/discectomy fusion 01/15/2011    Procedure: ANTERIOR CERVICAL DECOMPRESSION/DISCECTOMY FUSION 2 LEVELS;  Surgeon: Kathaleen Maser Pool;  Location: [facility];  Service: Neurosurgery;  Laterality: N/A;  Cervical Three-Four, Cervical Four-Five Anterior Cervical Decompression Fusion   . Colonscopy   . Nose surgery     Current Outpatient Prescriptions  Medication  Sig Dispense Refill  . albuterol (PROVENTIL HFA;VENTOLIN HFA) 108 (90 BASE) MCG/ACT inhaler Inhale 1-2 puffs into the lungs 4 (four) times daily as needed. For wheezing or shortness of breath.      Marland Kitchen alendronate (FOSAMAX) 70 MG tablet Take 1 tablet (70 mg total) by mouth every 7 (seven) days. Take with a full glass of water on  an empty stomach. Patient takes on Mondays.  4 tablet  11  . ALPRAZolam (XANAX) 0.5 MG tablet Take 1 tablet (0.5 mg total) by mouth daily. As needed  30 tablet  5  . amLODipine (NORVASC) 5 MG tablet Take 1 tablet (5 mg total) by mouth daily.  90 tablet  3  . Cholecalciferol (VITAMIN D3) 1000 UNITS CAPS Take 1 capsule by mouth daily.       . citalopram (CELEXA) 10 MG tablet Take 1 tablet (10 mg total) by mouth daily.  90 tablet  3  . furosemide (LASIX) 40 MG tablet 1 tab by mouth in the Am daily, and 1 tab in the afternoon only for leg swelling recurrence  180 tablet  3  . HYDROcodone-acetaminophen (NORCO) 10-325 MG per tablet Take 1 tablet by mouth every 6 (six) hours as needed for pain.  120 tablet  1  . metoprolol succinate (TOPROL-XL) 25 MG 24 hr tablet Take 25 mg by mouth daily.      . pantoprazole (PROTONIX) 40 MG tablet Take 1 tablet (40 mg total) by mouth 2 (two) times daily.  180 tablet  3  . pravastatin (PRAVACHOL) 40 MG tablet Take 1 tablet (40 mg total) by mouth daily.  90 tablet  3  . zolpidem (AMBIEN) 5 MG tablet Take 1 tablet (5 mg total) by mouth at bedtime as needed. For sleep.  30 tablet  5    Allergies  Allergen Reactions  . Amoxicillin Itching  . Fentanyl Other (See Comments)    hallucinations  . Hydrocodone Other (See Comments)    ineffective  . Codeine Hives and Rash  . Penicillins Hives and Rash    History   Social History  . Marital Status: Widowed    Spouse Name: N/A    Number of Children: N/A  . Years of Education: N/A   Occupational History  . Not on file.   Social History Main Topics  . Smoking status: Former Smoker -- 1.5 packs/day for 40 years    Types: Cigarettes    Quit date: 02/15/2006  . Smokeless tobacco: Former Neurosurgeon  . Alcohol Use: No  . Drug Use: No  . Sexually Active: Yes    Birth Control/ Protection: Post-menopausal   Other Topics Concern  . Not on file   Social History Narrative  . No narrative on file    Family History    Problem Relation Age of Onset  . Hyperlipidemia Other   . Hypertension Other   . Coronary artery disease Other     several nephrew  . Heart disease Father   . Colon cancer Neg Hx   . Esophageal cancer Neg Hx   . Rectal cancer Neg Hx   . Stomach cancer Neg Hx     Review of Systems:  As stated in the HPI and otherwise negative.   BP 102/53  Pulse 78  Ht 5\' 2"  (1.575 m)  Wt 167 lb (75.751 kg)  BMI 30.54 kg/m2  Physical Examination: General: Well developed, well nourished, NAD HEENT: OP clear, mucus membranes moist SKIN: warm, dry. No rashes. Neuro: No focal  deficits Musculoskeletal: Muscle strength 5/5 all ext Psychiatric: Mood and affect normal Neck: No JVD, no carotid bruits, no thyromegaly, no lymphadenopathy. Lungs:Clear bilaterally, no wheezes, rhonci, crackles Cardiovascular: Regular rate and rhythm. Soft systolic murmur. No gallops or rubs. Abdomen:Soft. Bowel sounds present. Non-tender.  Extremities: No lower extremity edema. Pulses are 2 + in the bilateral DP/PT.  Echo 01/18/12: Left ventricle: The cavity size was normal. Wall thickness was increased in a pattern of mild LVH. Systolic function was normal. The estimated ejection fraction was in the range of 55% to 65%. Wall motion was normal; there were no regional wall motion abnormalities. Doppler parameters are consistent with abnormal left ventricular relaxation (grade 1 diastolic dysfunction). - Aortic valve: AV trileaflet. Mildly calcified Despite small systolic gradient appears to open widely Mild regurgitation. - Mitral valve: Mild regurgitation. - Left atrium: The atrium was moderately dilated. - Right atrium: The atrium was mildly dilated. - Atrial septum: No defect or patent foramen ovale was identified. - Pulmonary arteries: PA peak pressure: 43mm Hg (S).  Assessment and Plan:   1. Non-sustained ventricular tachycardia:  No palpitations. Normal LV function. No recurrence of palpitations. Will  continue Toprol.   2. Mitral regurgitation: Mild by echo December 2013.   3. Lower extremity edema: Continue Lasix. Use extra as needed for increased weight or swelling with extra banana.

## 2012-01-25 NOTE — Patient Instructions (Addendum)
Your physician wants you to follow-up in:  12 months.  You will receive a reminder letter in the mail two months in advance. If you don't receive a letter, please call our office to schedule the follow-up appointment.   

## 2012-02-08 ENCOUNTER — Ambulatory Visit: Payer: Medicare Other | Admitting: Internal Medicine

## 2012-02-08 ENCOUNTER — Encounter: Payer: Self-pay | Admitting: Internal Medicine

## 2012-02-08 ENCOUNTER — Ambulatory Visit (INDEPENDENT_AMBULATORY_CARE_PROVIDER_SITE_OTHER): Payer: Medicare Other | Admitting: Internal Medicine

## 2012-02-08 VITALS — BP 124/62 | HR 68 | Temp 97.9°F | Ht 62.0 in | Wt 168.8 lb

## 2012-02-08 DIAGNOSIS — I8390 Asymptomatic varicose veins of unspecified lower extremity: Secondary | ICD-10-CM

## 2012-02-08 DIAGNOSIS — J329 Chronic sinusitis, unspecified: Secondary | ICD-10-CM

## 2012-02-08 MED ORDER — LEVOFLOXACIN 500 MG PO TABS: 500.0000 mg | ORAL_TABLET | Freq: Every day | ORAL | Status: DC

## 2012-02-08 MED ORDER — METHYLPREDNISOLONE ACETATE 80 MG/ML IJ SUSP
80.0000 mg | Freq: Once | INTRAMUSCULAR | Status: AC
  Administered 2012-02-08: 80 mg via INTRAMUSCULAR

## 2012-02-08 NOTE — Patient Instructions (Signed)

## 2012-02-08 NOTE — Progress Notes (Signed)
HPI  Pt presents to the clinic with c/o sinus pain and pressure x 7 days. She has tried OTC sudafed without relief. She has a history of recurrent sinusitis and frequently has to get abx. The last one was in 06/2011. Additionally, she c/o of a lump on her left leg. It has always been there but she is unsure of what it is. It in not painful.  Review of Systems    Past Medical History  Diagnosis Date  . HYPERLIPIDEMIA 11/18/2006  . HYPERKALEMIA 02/05/2008  . ANEMIA-IRON DEFICIENCY 09/22/2008  . Anemia of other chronic disease 02/05/2008  . ANXIETY 06/03/2008  . DEPRESSION 04/28/2009  . HYPERTENSION 11/18/2006  . PERICARDITIS 01/03/2007  . AORTIC STENOSIS/ INSUFFICIENCY, NON-RHEUMATIC 12/03/2008  . SINUSITIS- ACUTE-NOS 11/20/2007  . SINUSITIS, CHRONIC 02/05/2008  . ALLERGIC RHINITIS 11/18/2006  . PNEUMONIA 05/02/2007  . C O P D 06/27/2008  . Stricture and stenosis of esophagus 11/14/2008  . GERD 01/03/2007  . BARRETTS ESOPHAGUS 11/14/2008  . RENAL INSUFFICIENCY 02/05/2008  . DYSPEPSIA 03/23/2007  . PRURITUS 10/08/2008  . OSTEOARTHRITIS 11/18/2006  . OSTEOARTHRITIS, KNEE, LEFT 01/03/2007  . Cervicalgia 01/13/2009  . BACK PAIN 02/04/2009  . BURSITIS, LEFT HIP 04/18/2009  . OSTEOPOROSIS 11/18/2006  . INSOMNIA-SLEEP DISORDER-UNSPEC 06/03/2008  . FATIGUE 04/18/2009  . PERIPHERAL EDEMA 02/13/2009  . MURMUR 11/18/2006  . DYSPHAGIA UNSPECIFIED 03/23/2007  . Abdominal pain, generalized 03/01/2007  . Nonspecific (abnormal) findings on radiological and other examination of body structure 06/03/2008  . Tuberculin Test Reaction 11/18/2006  . Acute bronchitis 02/23/2010  . CHOLELITHIASIS 04/24/2010  . NEPHROLITHIASIS, HX OF 04/24/2010  . Polyarthralgia 06/09/2010  . Elevated sed rate 06/11/2010  . Positive ANA (antinuclear antibody) 06/11/2010  . Rheumatoid factor positive 06/11/2010  . Cancer     lung ca  . 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 SPONDYLOSIS, CERVICAL, WITH RADICULOPATHY 01/13/2009  . Cervical  radiculopathy 09/28/2010  . Lumbar radiculopathy 09/28/2010    Family History  Problem Relation Age of Onset  . Hyperlipidemia Other   . Hypertension Other   . Coronary artery disease Other     several nephrew  . Heart disease Father   . Colon cancer Neg Hx   . Esophageal cancer Neg Hx   . Rectal cancer Neg Hx   . Stomach cancer Neg Hx     History   Social History  . Marital Status: Widowed    Spouse Name: N/A    Number of Children: N/A  . Years of Education: N/A   Occupational History  . Not on file.   Social History Main Topics  . Smoking status: Former Smoker -- 1.5 packs/day for 40 years    Types: Cigarettes    Quit date: 02/15/2006  . Smokeless tobacco: Former Neurosurgeon  . Alcohol Use: No  . Drug Use: No  . Sexually Active: Yes    Birth Control/ Protection: Post-menopausal   Other Topics Concern  . Not on file   Social History Narrative  . No narrative on file    Allergies  Allergen Reactions  . Amoxicillin Itching  . Fentanyl Other (See Comments)    hallucinations  . Hydrocodone Other (See Comments)    ineffective  . Codeine Hives and Rash  . Penicillins Hives and Rash     Constitutional: Positive headache, fatigue and fever. Denies abrupt weight changes.  HEENT:  Positive eye pain, pressure behind the eyes, facial pain, nasal congestion and sore throat. Denies eye redness, ear pain, ringing in the  ears, wax buildup, runny nose or bloody nose. Respiratory: Positive cough and thick green sputum production. Denies difficulty breathing or shortness of breath.  Cardiovascular: Denies chest pain, chest tightness, palpitations or swelling in the hands or feet. Pt reports lump on left leg.  No other specific complaints in a complete review of systems (except as listed in HPI above).  Objective:    General: Appears her stated age, well developed, well nourished in NAD. HEENT: Head: normal shape and size; Eyes: sclera white, no icterus, conjunctiva pink,  PERRLA and EOMs intact; Ears: Tm's gray and intact, normal light reflex; Nose: mucosa pink and moist, septum midline; Throat/Mouth: + PND. Teeth present, mucosa pink and moist, no exudate noted, no lesions or ulcerations noted.  Neck: Mild cervical lymphadenopathy. Neck supple, trachea midline. No massses, lumps or thyromegaly present.  Cardiovascular: Normal rate and rhythm. S1,S2 noted.  No murmur, rubs or gallops noted. No JVD or BLE edema. No carotid bruits noted. Evidence of varicose veins noted on BLE. Pulmonary/Chest: Normal effort and positive vesicular breath sounds. No respiratory distress. No wheezes, rales or ronchi noted.      Assessment & Plan:   Acute bacterial sinusitis  Can use a Neti Pot which can be purchased from your local drug store. Flonase 2 sprays each nostril for 3 days and then as needed. Levaquin x 7 days 80 mg depo medrol  Varicose Veins:  Reassurance given Offered with a referral to vein clinic but pt declined  RTC as needed or if symptoms persist.

## 2012-02-08 NOTE — Addendum Note (Signed)
Addended by: Carin Primrose on: 02/08/2012 12:38 PM   Modules accepted: Orders

## 2012-03-13 ENCOUNTER — Other Ambulatory Visit: Payer: Self-pay | Admitting: Cardiovascular Disease

## 2012-03-27 ENCOUNTER — Other Ambulatory Visit: Payer: Self-pay | Admitting: Internal Medicine

## 2012-03-27 NOTE — Telephone Encounter (Signed)
Done hardcopy to robin  

## 2012-03-27 NOTE — Telephone Encounter (Signed)
Faxed hardcopy to pharmacy. 

## 2012-03-28 ENCOUNTER — Telehealth: Payer: Self-pay

## 2012-03-28 MED ORDER — ALENDRONATE SODIUM 70 MG PO TABS: 70.0000 mg | ORAL_TABLET | ORAL | Status: DC

## 2012-03-28 NOTE — Telephone Encounter (Signed)
refill 

## 2012-03-29 ENCOUNTER — Ambulatory Visit: Payer: Medicare Other | Admitting: Internal Medicine

## 2012-04-01 ENCOUNTER — Other Ambulatory Visit: Payer: Self-pay

## 2012-04-05 ENCOUNTER — Ambulatory Visit (INDEPENDENT_AMBULATORY_CARE_PROVIDER_SITE_OTHER): Payer: Medicare Other | Admitting: Internal Medicine

## 2012-04-05 ENCOUNTER — Encounter: Payer: Self-pay | Admitting: Internal Medicine

## 2012-04-05 VITALS — BP 120/62 | HR 62 | Temp 98.0°F | Ht 62.0 in | Wt 163.1 lb

## 2012-04-05 DIAGNOSIS — F329 Major depressive disorder, single episode, unspecified: Secondary | ICD-10-CM

## 2012-04-05 DIAGNOSIS — I10 Essential (primary) hypertension: Secondary | ICD-10-CM

## 2012-04-05 DIAGNOSIS — M722 Plantar fascial fibromatosis: Secondary | ICD-10-CM | POA: Insufficient documentation

## 2012-04-05 DIAGNOSIS — N189 Chronic kidney disease, unspecified: Secondary | ICD-10-CM

## 2012-04-05 NOTE — Patient Instructions (Addendum)
Please take the supplement such as Oscal Plus D -  - 1 pill twice per day (with food if possible) Please continue all other medications as before, and refills have been done if requested. Please call if you need the referral to Podiatry for the heel and ankle Please go to the LAB in the Basement (turn left off the elevator) for the tests to be done today You will be contacted by phone if any changes need to be made immediately.  Otherwise, you will receive a letter about your results with an explanation Please return in 6 months, or sooner if needed

## 2012-04-05 NOTE — Assessment & Plan Note (Signed)
Mild, for soft soled shoes, heel inserts, consider podiatry

## 2012-04-05 NOTE — Progress Notes (Signed)
Subjective:    Patient ID: [name], female    DOB: 04-Jan-1941, 72 y.o.   MRN: [id number]  HPI  Here to f/u; overall doing ok,  Pt denies chest pain, increased sob or doe, wheezing, orthopnea, PND, increased LE swelling, palpitations, dizziness or syncope.  Pt denies polydipsia, polyuria, or low sugar symptoms such as weakness or confusion improved with po intake.  Pt denies new neurological symptoms such as new headache, or facial or extremity weakness or numbness.   Pt states overall good compliance with meds, has been trying to follow lower cholesterol diet, with wt overall stable,  but little exercise however. Denies worsening depressive symptoms, suicidal ideation, or panic; has ongoing anxiety, but not worse recently, pt and daughter feel overall she is improved after start of the celexa last visit.  Tolerating the amlodipine as well. Does c/o mild left heel pain c/w plantar fasciitis. Past Medical History  Diagnosis Date  . HYPERLIPIDEMIA 11/18/2006  . HYPERKALEMIA 02/05/2008  . ANEMIA-IRON DEFICIENCY 09/22/2008  . Anemia of other chronic disease 02/05/2008  . ANXIETY 06/03/2008  . DEPRESSION 04/28/2009  . HYPERTENSION 11/18/2006  . PERICARDITIS 01/03/2007  . AORTIC STENOSIS/ INSUFFICIENCY, NON-RHEUMATIC 12/03/2008  . SINUSITIS- ACUTE-NOS 11/20/2007  . SINUSITIS, CHRONIC 02/05/2008  . ALLERGIC RHINITIS 11/18/2006  . PNEUMONIA 05/02/2007  . C O P D 06/27/2008  . Stricture and stenosis of esophagus 11/14/2008  . GERD 01/03/2007  . BARRETTS ESOPHAGUS 11/14/2008  . RENAL INSUFFICIENCY 02/05/2008  . DYSPEPSIA 03/23/2007  . PRURITUS 10/08/2008  . OSTEOARTHRITIS 11/18/2006  . OSTEOARTHRITIS, KNEE, LEFT 01/03/2007  . Cervicalgia 01/13/2009  . BACK PAIN 02/04/2009  . BURSITIS, LEFT HIP 04/18/2009  . OSTEOPOROSIS 11/18/2006  . INSOMNIA-SLEEP DISORDER-UNSPEC 06/03/2008  . FATIGUE 04/18/2009  . PERIPHERAL EDEMA 02/13/2009  . MURMUR 11/18/2006  . DYSPHAGIA UNSPECIFIED 03/23/2007  . Abdominal pain,  generalized 03/01/2007  . Nonspecific (abnormal) findings on radiological and other examination of body structure 06/03/2008  . Tuberculin Test Reaction 11/18/2006  . Acute bronchitis 02/23/2010  . CHOLELITHIASIS 04/24/2010  . NEPHROLITHIASIS, HX OF 04/24/2010  . Polyarthralgia 06/09/2010  . Elevated sed rate 06/11/2010  . Positive ANA (antinuclear antibody) 06/11/2010  . Rheumatoid factor positive 06/11/2010  . Cancer     lung ca  . 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 SPONDYLOSIS, CERVICAL, WITH RADICULOPATHY 01/13/2009  . Cervical radiculopathy 09/28/2010  . Lumbar radiculopathy 09/28/2010   Past Surgical History  Procedure Laterality Date  . Abdominal hysterectomy    . Tubal ligation    . Tumor removal  08/06/08    from lungs  . Anterior cervical decomp/discectomy fusion  01/15/2011    Procedure: ANTERIOR CERVICAL DECOMPRESSION/DISCECTOMY FUSION 2 LEVELS;  Surgeon: Kathaleen Maser Pool;  Location: [facility];  Service: Neurosurgery;  Laterality: N/A;  Cervical Three-Four, Cervical Four-Five Anterior Cervical Decompression Fusion   . Colonscopy    . Nose surgery      reports that she quit smoking about 6 years ago. Her smoking use included Cigarettes. She has a 60 pack-year smoking history. She has quit using smokeless tobacco. She reports that she does not drink alcohol or use illicit drugs. family history includes Coronary artery disease in her other; Heart disease in her father; Hyperlipidemia in her other; and Hypertension in her other.  There is no history of Colon cancer, and Esophageal cancer, and Rectal cancer, and Stomach cancer, . Allergies  Allergen Reactions  . Amoxicillin Itching  . Fentanyl Other (See Comments)    hallucinations  .  Hydrocodone Other (See Comments)    ineffective  . Codeine Hives and Rash  . Penicillins Hives and Rash   Current Outpatient Prescriptions on File Prior to Visit  Medication Sig Dispense Refill  . albuterol (PROVENTIL HFA;VENTOLIN HFA) 108 (90  BASE) MCG/ACT inhaler Inhale 1-2 puffs into the lungs 4 (four) times daily as needed. For wheezing or shortness of breath.      Marland Kitchen alendronate (FOSAMAX) 70 MG tablet Take 1 tablet (70 mg total) by mouth every 7 (seven) days. Take with a full glass of water on an empty stomach. Patient takes on Mondays.  4 tablet  11  . ALPRAZolam (XANAX) 0.5 MG tablet Take 1 tablet (0.5 mg total) by mouth daily. As needed  30 tablet  5  . amLODipine (NORVASC) 5 MG tablet Take 1 tablet (5 mg total) by mouth daily.  90 tablet  3  . Cholecalciferol (VITAMIN D3) 1000 UNITS CAPS Take 1 capsule by mouth daily.       . citalopram (CELEXA) 10 MG tablet Take 1 tablet (10 mg total) by mouth daily.  90 tablet  3  . furosemide (LASIX) 40 MG tablet 1 tab by mouth in the Am daily, and 1 tab in the afternoon only for leg swelling recurrence  180 tablet  3  . HYDROcodone-acetaminophen (NORCO) 10-325 MG per tablet TAKE 1 TABLET BY MOUTH EVERY 6 HOURS AS NEEDED FOR PAIN  120 tablet  1  . levofloxacin (LEVAQUIN) 500 MG tablet Take 1 tablet (500 mg total) by mouth daily.  7 tablet  0  . metoprolol succinate (TOPROL-XL) 25 MG 24 hr tablet Take 1 tablet (25 mg total) by mouth daily.  90 tablet  3  . pantoprazole (PROTONIX) 40 MG tablet Take 1 tablet (40 mg total) by mouth 2 (two) times daily.  180 tablet  3  . pravastatin (PRAVACHOL) 40 MG tablet Take 1 tablet (40 mg total) by mouth daily.  90 tablet  3  . zolpidem (AMBIEN) 5 MG tablet Take 1 tablet (5 mg total) by mouth at bedtime as needed. For sleep.  30 tablet  5   No current facility-administered medications on file prior to visit.   Review of Systems  Constitutional: Negative for unexpected weight change, or unusual diaphoresis  HENT: Negative for tinnitus.   Eyes: Negative for photophobia and visual disturbance.  Respiratory: Negative for choking and stridor.   Gastrointestinal: Negative for vomiting and blood in stool.  Genitourinary: Negative for hematuria and decreased  urine volume.  Musculoskeletal: Negative for acute joint swelling Skin: Negative for color change and wound.  Neurological: Negative for tremors and numbness other than noted  Psychiatric/Behavioral: Negative for decreased concentration or  hyperactivity.       Objective:   Physical Exam BP 120/62  Pulse 62  Temp(Src) 98 F (36.7 C) (Oral)  Ht 5\' 2"  (1.575 m)  Wt 163 lb 2 oz (73.993 kg)  BMI 29.83 kg/m2  SpO2 93% VS noted,  Constitutional: Pt appears well-developed and well-nourished.  HENT: Head: NCAT.  Right Ear: External ear normal.  Left Ear: External ear normal.  Eyes: Conjunctivae and EOM are normal. Pupils are equal, round, and reactive to light.  Neck: Normal range of motion. Neck supple.  Cardiovascular: Normal rate and regular rhythm.   Pulmonary/Chest: Effort normal and breath sounds normal.  Neurological: Pt is alert. Not confused  Skin: Skin is warm. No erythema.  Psychiatric: Pt behavior is normal. Thought content normal. Left plantar heel mild tender,  no swelling, red or ulcer    Assessment & Plan:

## 2012-04-05 NOTE — Assessment & Plan Note (Signed)
Mild worsened cr last visit nov 2013; for f/u labs today, volume stable

## 2012-04-05 NOTE — Assessment & Plan Note (Signed)
stable overall by history and exam, recent data reviewed with pt, and pt to continue medical treatment as before,  to f/u any worsening symptoms or concerns BP Readings from Last 3 Encounters:  04/05/12 120/62  02/08/12 124/62  01/25/12 102/53

## 2012-04-05 NOTE — Assessment & Plan Note (Signed)
Improved, cont med as is, declines need for counseling

## 2012-04-11 ENCOUNTER — Other Ambulatory Visit (INDEPENDENT_AMBULATORY_CARE_PROVIDER_SITE_OTHER): Payer: Medicare Other

## 2012-04-11 ENCOUNTER — Other Ambulatory Visit: Payer: Self-pay | Admitting: Internal Medicine

## 2012-04-11 DIAGNOSIS — Z0000 Encounter for general adult medical examination without abnormal findings: Secondary | ICD-10-CM

## 2012-04-11 DIAGNOSIS — N189 Chronic kidney disease, unspecified: Secondary | ICD-10-CM

## 2012-04-11 LAB — CBC WITH DIFFERENTIAL/PLATELET
Basophils Absolute: 0 10*3/uL (ref 0.0–0.1)
Eosinophils Relative: 2.3 % (ref 0.0–5.0)
Hemoglobin: 11.7 g/dL — ABNORMAL LOW (ref 12.0–15.0)
Lymphocytes Relative: 23.9 % (ref 12.0–46.0)
Monocytes Relative: 11.8 % (ref 3.0–12.0)
Neutro Abs: 2.8 10*3/uL (ref 1.4–7.7)
Platelets: 164 10*3/uL (ref 150.0–400.0)
RDW: 14.9 % — ABNORMAL HIGH (ref 11.5–14.6)
WBC: 4.6 10*3/uL (ref 4.5–10.5)

## 2012-04-11 LAB — LIPID PANEL
Cholesterol: 141 mg/dL (ref 0–200)
HDL: 46.5 mg/dL (ref >39.00)
Triglycerides: 108 mg/dL (ref 0.0–149.0)
VLDL: 21.6 mg/dL (ref 0.0–40.0)

## 2012-04-11 LAB — URINALYSIS, ROUTINE W REFLEX MICROSCOPIC
Leukocytes, UA: NEGATIVE
Nitrite: NEGATIVE
Specific Gravity, Urine: 1.01 (ref 1.000–1.030)
Urobilinogen, UA: 0.2 (ref 0.0–1.0)
pH: 5.5 (ref 5.0–8.0)

## 2012-04-11 LAB — HEPATIC FUNCTION PANEL
Albumin: 3.7 g/dL (ref 3.5–5.2)
Alkaline Phosphatase: 47 U/L (ref 39–117)
Total Protein: 7.2 g/dL (ref 6.0–8.3)

## 2012-04-11 LAB — BASIC METABOLIC PANEL
BUN: 46 mg/dL — ABNORMAL HIGH (ref 6–23)
CO2: 31 mEq/L (ref 19–32)
Calcium: 9.3 mg/dL (ref 8.4–10.5)
GFR: 32.45 mL/min — ABNORMAL LOW (ref >60.00)
Glucose, Bld: 107 mg/dL — ABNORMAL HIGH (ref 70–99)
Sodium: 140 mEq/L (ref 135–145)

## 2012-04-11 LAB — TSH: TSH: 1.76 u[IU]/mL (ref 0.35–5.50)

## 2012-04-12 ENCOUNTER — Telehealth: Payer: Self-pay

## 2012-04-12 NOTE — Telephone Encounter (Signed)
Message copied by Pincus Sanes on Wed Apr 12, 2012  1:58 PM ------      Message from: Corwin Levins      Created: Wed Apr 12, 2012 12:43 PM       Sorry, there really isnt any effective tx for gas itself, except to eat small bites slowly, and maybe think of simethicone otc prn but this often doesn't really work very well      ----- Message -----         From: Vladimir Crofts Ewing         Sent: 04/12/2012   9:32 AM           To: Corwin Levins, MD            The patient would like advisement on what to take for gas.  She states every time she eats she has gas and has become a real problem to her.       ------

## 2012-04-12 NOTE — Telephone Encounter (Signed)
Patient informed. 

## 2012-04-25 ENCOUNTER — Other Ambulatory Visit: Payer: Self-pay

## 2012-04-25 MED ORDER — ZOLPIDEM TARTRATE 5 MG PO TABS: 5.0000 mg | ORAL_TABLET | Freq: Every evening | ORAL | Status: DC | PRN

## 2012-04-25 NOTE — Telephone Encounter (Signed)
Faxed hardcopy to pharmacy. 

## 2012-04-25 NOTE — Telephone Encounter (Signed)
Done hardcopy to robin  

## 2012-05-11 ENCOUNTER — Other Ambulatory Visit: Payer: Self-pay | Admitting: Internal Medicine

## 2012-05-16 ENCOUNTER — Other Ambulatory Visit: Payer: Self-pay

## 2012-05-16 DIAGNOSIS — Z1231 Encounter for screening mammogram for malignant neoplasm of breast: Secondary | ICD-10-CM

## 2012-05-17 ENCOUNTER — Other Ambulatory Visit: Payer: Self-pay | Admitting: Internal Medicine

## 2012-05-18 ENCOUNTER — Ambulatory Visit (INDEPENDENT_AMBULATORY_CARE_PROVIDER_SITE_OTHER): Payer: Medicare Other | Admitting: Internal Medicine

## 2012-05-18 ENCOUNTER — Encounter: Payer: Self-pay | Admitting: Internal Medicine

## 2012-05-18 VITALS — BP 112/68 | HR 56 | Temp 98.0°F | Ht 62.0 in | Wt 173.5 lb

## 2012-05-18 DIAGNOSIS — I10 Essential (primary) hypertension: Secondary | ICD-10-CM

## 2012-05-18 DIAGNOSIS — N189 Chronic kidney disease, unspecified: Secondary | ICD-10-CM

## 2012-05-18 DIAGNOSIS — R609 Edema, unspecified: Secondary | ICD-10-CM

## 2012-05-18 MED ORDER — HYDROCODONE-ACETAMINOPHEN 10-325 MG PO TABS: ORAL_TABLET | ORAL | Status: DC

## 2012-05-18 NOTE — Patient Instructions (Signed)
Please continue all other medications as before, and refills have been done if requested. You are given the prescription for knee high compression stockings today Please keep your appointments with your specialists as you have planned - kidney doctors, and cardiology Thank you for enrolling in MyChart. Please follow the instructions below to securely access your online medical record. MyChart allows you to send messages to your doctor, view your test results, renew your prescriptions, schedule appointments, and more. Please return in 6 months, or sooner if needed,

## 2012-05-18 NOTE — Assessment & Plan Note (Signed)
stable overall by history and exam, recent data reviewed with pt, and pt to continue medical treatment as before,  to f/u any worsening symptoms or concerns BP Readings from Last 3 Encounters:  05/18/12 112/68  04/05/12 120/62  02/08/12 124/62

## 2012-05-18 NOTE — Assessment & Plan Note (Signed)
Recent labs reviewed with pt, to cont meds, f/u renal as planned

## 2012-05-18 NOTE — Assessment & Plan Note (Signed)
Multifactorial including increased oral fluid intake, varicosities, renal insufficiency; no overt knee effusions today, for now to not push oral fluids, cont lasix as is, leg elevation, and low sodium diet, and compression stockings during day (knee high)

## 2012-05-18 NOTE — Progress Notes (Signed)
Subjective:     Patient ID: [name], female   DOB: 1940/09/01, 72 y.o.   MRN: [id number]  HPI  Here with daughter, c/o ? Mild worsening LE edema for 2 wks below the knees, despite taking lasix 40 bid, which seems to give her good response with diuresis.  Swelling better in the AM, worse in the PM.  Also with several varicosities to LE's.  Also tends to drink more fluids recently, as she heard this would help the swelling. Pt denies chest pain, increased sob or doe, wheezing, orthopnea, PND, increased LE swelling, palpitations, dizziness or syncope except for the above Recent echo with EF 55-65%, labs with Cr 2.0, Hgb 11.7 Past Medical History  Diagnosis Date  . HYPERLIPIDEMIA 11/18/2006  . HYPERKALEMIA 02/05/2008  . ANEMIA-IRON DEFICIENCY 09/22/2008  . Anemia of other chronic disease 02/05/2008  . ANXIETY 06/03/2008  . DEPRESSION 04/28/2009  . HYPERTENSION 11/18/2006  . PERICARDITIS 01/03/2007  . AORTIC STENOSIS/ INSUFFICIENCY, NON-RHEUMATIC 12/03/2008  . SINUSITIS- ACUTE-NOS 11/20/2007  . SINUSITIS, CHRONIC 02/05/2008  . ALLERGIC RHINITIS 11/18/2006  . PNEUMONIA 05/02/2007  . C O P D 06/27/2008  . Stricture and stenosis of esophagus 11/14/2008  . GERD 01/03/2007  . BARRETTS ESOPHAGUS 11/14/2008  . RENAL INSUFFICIENCY 02/05/2008  . DYSPEPSIA 03/23/2007  . PRURITUS 10/08/2008  . OSTEOARTHRITIS 11/18/2006  . OSTEOARTHRITIS, KNEE, LEFT 01/03/2007  . Cervicalgia 01/13/2009  . BACK PAIN 02/04/2009  . BURSITIS, LEFT HIP 04/18/2009  . OSTEOPOROSIS 11/18/2006  . INSOMNIA-SLEEP DISORDER-UNSPEC 06/03/2008  . FATIGUE 04/18/2009  . PERIPHERAL EDEMA 02/13/2009  . MURMUR 11/18/2006  . DYSPHAGIA UNSPECIFIED 03/23/2007  . Abdominal pain, generalized 03/01/2007  . Nonspecific (abnormal) findings on radiological and other examination of body structure 06/03/2008  . Tuberculin Test Reaction 11/18/2006  . Acute bronchitis 02/23/2010  . CHOLELITHIASIS 04/24/2010  . NEPHROLITHIASIS, HX OF 04/24/2010  . Polyarthralgia  06/09/2010  . Elevated sed rate 06/11/2010  . Positive ANA (antinuclear antibody) 06/11/2010  . Rheumatoid factor positive 06/11/2010  . Cancer     lung ca  . 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 SPONDYLOSIS, CERVICAL, WITH RADICULOPATHY 01/13/2009  . Cervical radiculopathy 09/28/2010  . Lumbar radiculopathy 09/28/2010   Past Surgical History  Procedure Laterality Date  . Abdominal hysterectomy    . Tubal ligation    . Tumor removal  08/06/08    from lungs  . Anterior cervical decomp/discectomy fusion  01/15/2011    Procedure: ANTERIOR CERVICAL DECOMPRESSION/DISCECTOMY FUSION 2 LEVELS;  Surgeon: Kathaleen Maser Pool;  Location: [facility];  Service: Neurosurgery;  Laterality: N/A;  Cervical Three-Four, Cervical Four-Five Anterior Cervical Decompression Fusion   . Colonscopy    . Nose surgery      reports that she quit smoking about 6 years ago. Her smoking use included Cigarettes. She has a 60 pack-year smoking history. She has quit using smokeless tobacco. She reports that she does not drink alcohol or use illicit drugs. family history includes Coronary artery disease in her other; Heart disease in her father; Hyperlipidemia in her other; and Hypertension in her other.  There is no history of Colon cancer, and Esophageal cancer, and Rectal cancer, and Stomach cancer, . Allergies  Allergen Reactions  . Amoxicillin Itching  . Fentanyl Other (See Comments)    hallucinations  . Hydrocodone Other (See Comments)    ineffective  . Codeine Hives and Rash  . Penicillins Hives and Rash   Review of Systems  Constitutional: Negative for unexpected weight change, or unusual diaphoresis  HENT: Negative for tinnitus.   Eyes: Negative for photophobia and visual disturbance.  Respiratory: Negative for choking and stridor.   Gastrointestinal: Negative for vomiting and blood in stool.  Genitourinary: Negative for hematuria and decreased urine volume.  Musculoskeletal: Negative for acute joint  swelling Skin: Negative for color change and wound.  Neurological: Negative for tremors and numbness other than noted  Psychiatric/Behavioral: Negative for decreased concentration or  hyperactivity.       Objective:   Physical Exam BP 112/68  Pulse 56  Temp(Src) 98 F (36.7 C) (Oral)  Ht 5\' 2"  (1.575 m)  Wt 173 lb 8 oz (78.699 kg)  BMI 31.73 kg/m2  SpO2 92% VS noted, not ill appearing Constitutional: Pt appears well-developed and well-nourished.  HENT: Head: NCAT.  Right Ear: External ear normal.  Left Ear: External ear normal.  Eyes: Conjunctivae and EOM are normal. Pupils are equal, round, and reactive to light.  Neck: Normal range of motion. Neck supple.  Cardiovascular: Normal rate and regular rhythm.   Pulmonary/Chest: Effort normal and breath sounds normal.  Abd:  Soft, NT, non-distended, + BS Neurological: Pt is alert. Not confused  Skin: Skin is warm. No erythema except for ? Mild stasis changes to left anterior mid leg.  Has currently trace to 1+ edema bilat right > left, nontense  Psychiatric: Pt behavior is normal. Thought content normal. mild nervous    Assessment:        Plan:

## 2012-05-25 ENCOUNTER — Inpatient Hospital Stay (HOSPITAL_COMMUNITY)
Admission: EM | Admit: 2012-05-25 | Discharge: 2012-06-06 | DRG: 329 | Disposition: A | Discharge status: Skilled Nursing Facility | Payer: Medicare Other | Attending: Internal Medicine | Admitting: Internal Medicine

## 2012-05-25 ENCOUNTER — Encounter (HOSPITAL_COMMUNITY): Payer: Self-pay | Admitting: Registered Nurse

## 2012-05-25 ENCOUNTER — Emergency Department (HOSPITAL_COMMUNITY): Payer: Medicare Other

## 2012-05-25 ENCOUNTER — Inpatient Hospital Stay (HOSPITAL_COMMUNITY): Payer: Medicare Other | Admitting: Registered Nurse

## 2012-05-25 ENCOUNTER — Telehealth (HOSPITAL_COMMUNITY): Payer: Self-pay | Admitting: Emergency Medicine

## 2012-05-25 ENCOUNTER — Encounter (HOSPITAL_COMMUNITY)
Admission: EM | Disposition: A | Discharge status: Skilled Nursing Facility | Payer: Self-pay | Source: Home / Self Care | Attending: Internal Medicine

## 2012-05-25 ENCOUNTER — Encounter (HOSPITAL_COMMUNITY): Payer: Self-pay | Admitting: Family Medicine

## 2012-05-25 ENCOUNTER — Inpatient Hospital Stay (HOSPITAL_COMMUNITY): Payer: Medicare Other

## 2012-05-25 DIAGNOSIS — Z79899 Other long term (current) drug therapy: Secondary | ICD-10-CM

## 2012-05-25 DIAGNOSIS — K35209 Acute appendicitis with generalized peritonitis, without abscess, unspecified as to perforation: Principal | ICD-10-CM | POA: Diagnosis present

## 2012-05-25 DIAGNOSIS — K660 Peritoneal adhesions (postprocedural) (postinfection): Secondary | ICD-10-CM | POA: Diagnosis present

## 2012-05-25 DIAGNOSIS — I5033 Acute on chronic diastolic (congestive) heart failure: Secondary | ICD-10-CM | POA: Diagnosis not present

## 2012-05-25 DIAGNOSIS — Z87891 Personal history of nicotine dependence: Secondary | ICD-10-CM

## 2012-05-25 DIAGNOSIS — J449 Chronic obstructive pulmonary disease, unspecified: Secondary | ICD-10-CM

## 2012-05-25 DIAGNOSIS — D62 Acute posthemorrhagic anemia: Secondary | ICD-10-CM | POA: Diagnosis not present

## 2012-05-25 DIAGNOSIS — R000 Tachycardia, unspecified: Secondary | ICD-10-CM | POA: Diagnosis not present

## 2012-05-25 DIAGNOSIS — Z5331 Laparoscopic surgical procedure converted to open procedure: Secondary | ICD-10-CM

## 2012-05-25 DIAGNOSIS — K219 Gastro-esophageal reflux disease without esophagitis: Secondary | ICD-10-CM | POA: Diagnosis present

## 2012-05-25 DIAGNOSIS — C3490 Malignant neoplasm of unspecified part of unspecified bronchus or lung: Secondary | ICD-10-CM | POA: Diagnosis present

## 2012-05-25 DIAGNOSIS — R109 Unspecified abdominal pain: Secondary | ICD-10-CM

## 2012-05-25 DIAGNOSIS — Z01810 Encounter for preprocedural cardiovascular examination: Secondary | ICD-10-CM

## 2012-05-25 DIAGNOSIS — K3580 Unspecified acute appendicitis: Secondary | ICD-10-CM

## 2012-05-25 DIAGNOSIS — I5031 Acute diastolic (congestive) heart failure: Secondary | ICD-10-CM

## 2012-05-25 DIAGNOSIS — Z66 Do not resuscitate: Secondary | ICD-10-CM | POA: Diagnosis present

## 2012-05-25 DIAGNOSIS — Z902 Acquired absence of lung [part of]: Secondary | ICD-10-CM

## 2012-05-25 DIAGNOSIS — N183 Chronic kidney disease, stage 3 unspecified: Secondary | ICD-10-CM | POA: Diagnosis present

## 2012-05-25 DIAGNOSIS — K222 Esophageal obstruction: Secondary | ICD-10-CM | POA: Diagnosis present

## 2012-05-25 DIAGNOSIS — K559 Vascular disorder of intestine, unspecified: Secondary | ICD-10-CM | POA: Diagnosis present

## 2012-05-25 DIAGNOSIS — F329 Major depressive disorder, single episode, unspecified: Secondary | ICD-10-CM

## 2012-05-25 DIAGNOSIS — F411 Generalized anxiety disorder: Secondary | ICD-10-CM | POA: Diagnosis present

## 2012-05-25 DIAGNOSIS — K352 Acute appendicitis with generalized peritonitis, without abscess: Principal | ICD-10-CM | POA: Diagnosis present

## 2012-05-25 DIAGNOSIS — N189 Chronic kidney disease, unspecified: Secondary | ICD-10-CM

## 2012-05-25 DIAGNOSIS — E785 Hyperlipidemia, unspecified: Secondary | ICD-10-CM | POA: Diagnosis present

## 2012-05-25 DIAGNOSIS — F3289 Other specified depressive episodes: Secondary | ICD-10-CM | POA: Diagnosis present

## 2012-05-25 DIAGNOSIS — J189 Pneumonia, unspecified organism: Secondary | ICD-10-CM | POA: Diagnosis not present

## 2012-05-25 DIAGNOSIS — J4489 Other specified chronic obstructive pulmonary disease: Secondary | ICD-10-CM | POA: Diagnosis present

## 2012-05-25 DIAGNOSIS — R002 Palpitations: Secondary | ICD-10-CM | POA: Diagnosis present

## 2012-05-25 DIAGNOSIS — I359 Nonrheumatic aortic valve disorder, unspecified: Secondary | ICD-10-CM | POA: Diagnosis present

## 2012-05-25 DIAGNOSIS — G4700 Insomnia, unspecified: Secondary | ICD-10-CM | POA: Diagnosis present

## 2012-05-25 DIAGNOSIS — I129 Hypertensive chronic kidney disease with stage 1 through stage 4 chronic kidney disease, or unspecified chronic kidney disease: Secondary | ICD-10-CM | POA: Diagnosis present

## 2012-05-25 DIAGNOSIS — D509 Iron deficiency anemia, unspecified: Secondary | ICD-10-CM | POA: Diagnosis present

## 2012-05-25 DIAGNOSIS — I10 Essential (primary) hypertension: Secondary | ICD-10-CM

## 2012-05-25 DIAGNOSIS — G8918 Other acute postprocedural pain: Secondary | ICD-10-CM | POA: Diagnosis not present

## 2012-05-25 HISTORY — PX: LAPAROSCOPIC LYSIS OF ADHESIONS: SHX5905

## 2012-05-25 HISTORY — PX: LAPAROSCOPIC APPENDECTOMY: SHX408

## 2012-05-25 HISTORY — PX: BOWEL RESECTION: SHX1257

## 2012-05-25 LAB — CBC
Hemoglobin: 9.8 g/dL — ABNORMAL LOW (ref 12.0–15.0)
MCHC: 34 g/dL (ref 30.0–36.0)
Platelets: 165 10*3/uL (ref 150–400)
RBC: 2.93 MIL/uL — ABNORMAL LOW (ref 3.87–5.11)

## 2012-05-25 LAB — TYPE AND SCREEN
ABO/RH(D): O POS
Antibody Screen: NEGATIVE

## 2012-05-25 LAB — COMPREHENSIVE METABOLIC PANEL
Alkaline Phosphatase: 52 U/L (ref 39–117)
BUN: 29 mg/dL — ABNORMAL HIGH (ref 6–23)
Chloride: 97 mEq/L (ref 96–112)
GFR calc Af Amer: 39 mL/min — ABNORMAL LOW (ref >90)
GFR calc non Af Amer: 34 mL/min — ABNORMAL LOW (ref >90)
Glucose, Bld: 108 mg/dL — ABNORMAL HIGH (ref 70–99)
Potassium: 4.6 mEq/L (ref 3.5–5.1)
Total Bilirubin: 0.4 mg/dL (ref 0.3–1.2)

## 2012-05-25 LAB — ABO/RH: ABO/RH(D): O POS

## 2012-05-25 LAB — LIPASE, BLOOD: Lipase: 27 U/L (ref 11–59)

## 2012-05-25 LAB — CBC WITH DIFFERENTIAL/PLATELET
HCT: 40.2 % (ref 36.0–46.0)
Hemoglobin: 13.3 g/dL (ref 12.0–15.0)
Lymphs Abs: 0.9 10*3/uL (ref 0.7–4.0)
MCH: 32.8 pg (ref 26.0–34.0)
Monocytes Relative: 9 % (ref 3–12)
Neutro Abs: 6.8 10*3/uL (ref 1.7–7.7)
Neutrophils Relative %: 80 % — ABNORMAL HIGH (ref 43–77)
RBC: 4.06 MIL/uL (ref 3.87–5.11)

## 2012-05-25 LAB — POCT I-STAT 4, (NA,K, GLUC, HGB,HCT)
Hemoglobin: 12.6 g/dL (ref 12.0–15.0)
Sodium: 138 mEq/L (ref 135–145)

## 2012-05-25 LAB — URINALYSIS, ROUTINE W REFLEX MICROSCOPIC
Bilirubin Urine: NEGATIVE
Ketones, ur: NEGATIVE mg/dL
Nitrite: NEGATIVE
Urobilinogen, UA: 0.2 mg/dL (ref 0.0–1.0)

## 2012-05-25 LAB — SURGICAL PCR SCREEN: MRSA, PCR: NEGATIVE

## 2012-05-25 SURGERY — APPENDECTOMY, LAPAROSCOPIC
Anesthesia: General | Wound class: Dirty or Infected

## 2012-05-25 MED ORDER — HYDROMORPHONE HCL PF 1 MG/ML IJ SOLN
INTRAMUSCULAR | Status: AC
  Filled 2012-05-25: qty 1

## 2012-05-25 MED ORDER — SUFENTANIL CITRATE 50 MCG/ML IV SOLN
INTRAVENOUS | Status: DC | PRN
  Administered 2012-05-25: 5 ug via INTRAVENOUS
  Administered 2012-05-25: 10 ug via INTRAVENOUS
  Administered 2012-05-25 (×5): 5 ug via INTRAVENOUS
  Administered 2012-05-25: 10 ug via INTRAVENOUS

## 2012-05-25 MED ORDER — ACETAMINOPHEN 10 MG/ML IV SOLN
INTRAVENOUS | Status: AC
  Filled 2012-05-25: qty 100

## 2012-05-25 MED ORDER — PROPOFOL 10 MG/ML IV BOLUS
INTRAVENOUS | Status: DC | PRN
  Administered 2012-05-25: 100 mg via INTRAVENOUS

## 2012-05-25 MED ORDER — DEXTROSE IN LACTATED RINGERS 5 % IV SOLN
INTRAVENOUS | Status: DC
  Administered 2012-05-25: 17:00:00 via INTRAVENOUS
  Administered 2012-05-26: 125 mL via INTRAVENOUS
  Administered 2012-05-26: 125 mL/h via INTRAVENOUS

## 2012-05-25 MED ORDER — BUPIVACAINE HCL (PF) 0.5 % IJ SOLN
INTRAMUSCULAR | Status: AC
  Filled 2012-05-25: qty 30

## 2012-05-25 MED ORDER — ONDANSETRON HCL 4 MG/2ML IJ SOLN
4.0000 mg | Freq: Once | INTRAMUSCULAR | Status: AC
  Administered 2012-05-25: 4 mg via INTRAVENOUS
  Filled 2012-05-25: qty 2

## 2012-05-25 MED ORDER — CISATRACURIUM BESYLATE (PF) 10 MG/5ML IV SOLN
INTRAVENOUS | Status: DC | PRN
  Administered 2012-05-25 (×2): 2 mg via INTRAVENOUS
  Administered 2012-05-25: 3 mg via INTRAVENOUS
  Administered 2012-05-25: 5 mg via INTRAVENOUS

## 2012-05-25 MED ORDER — ALBUTEROL SULFATE (5 MG/ML) 0.5% IN NEBU
INHALATION_SOLUTION | RESPIRATORY_TRACT | Status: AC
  Filled 2012-05-25: qty 0.5

## 2012-05-25 MED ORDER — IOHEXOL 300 MG/ML  SOLN
50.0000 mL | Freq: Once | INTRAMUSCULAR | Status: AC | PRN
  Administered 2012-05-25: 50 mL via ORAL

## 2012-05-25 MED ORDER — IPRATROPIUM BROMIDE 0.02 % IN SOLN
0.5000 mg | Freq: Four times a day (QID) | RESPIRATORY_TRACT | Status: DC
  Administered 2012-05-25: 0.5 mg via RESPIRATORY_TRACT
  Filled 2012-05-25 (×2): qty 2.5

## 2012-05-25 MED ORDER — NEOSTIGMINE METHYLSULFATE 1 MG/ML IJ SOLN
INTRAMUSCULAR | Status: DC | PRN
  Administered 2012-05-25: 3 mg via INTRAVENOUS

## 2012-05-25 MED ORDER — ALBUTEROL SULFATE HFA 108 (90 BASE) MCG/ACT IN AERS
1.0000 | INHALATION_SPRAY | Freq: Four times a day (QID) | RESPIRATORY_TRACT | Status: DC | PRN
  Filled 2012-05-25: qty 6.7

## 2012-05-25 MED ORDER — PROMETHAZINE HCL 25 MG/ML IJ SOLN: 6.2500 mg | INTRAMUSCULAR | Status: DC | PRN

## 2012-05-25 MED ORDER — ONDANSETRON HCL 4 MG/2ML IJ SOLN
INTRAMUSCULAR | Status: DC | PRN
  Administered 2012-05-25: 4 mg via INTRAVENOUS

## 2012-05-25 MED ORDER — ONDANSETRON HCL 4 MG PO TABS: 4.0000 mg | ORAL_TABLET | Freq: Four times a day (QID) | ORAL | Status: DC | PRN

## 2012-05-25 MED ORDER — SODIUM CHLORIDE 0.9 % IV BOLUS (SEPSIS)
500.0000 mL | Freq: Once | INTRAVENOUS | Status: AC
  Administered 2012-05-25: 03:00:00 via INTRAVENOUS

## 2012-05-25 MED ORDER — HYDROMORPHONE HCL PF 1 MG/ML IJ SOLN
0.2500 mg | INTRAMUSCULAR | Status: DC | PRN
  Administered 2012-05-25 (×4): 0.5 mg via INTRAVENOUS

## 2012-05-25 MED ORDER — PHENYLEPHRINE HCL 10 MG/ML IJ SOLN
INTRAMUSCULAR | Status: DC | PRN
  Administered 2012-05-25: 120 ug via INTRAVENOUS

## 2012-05-25 MED ORDER — LIDOCAINE HCL (CARDIAC) 20 MG/ML IV SOLN
INTRAVENOUS | Status: DC | PRN
  Administered 2012-05-25: 50 mg via INTRAVENOUS

## 2012-05-25 MED ORDER — LACTATED RINGERS IV SOLN
INTRAVENOUS | Status: DC | PRN
  Administered 2012-05-25: 1000 mL via INTRAVENOUS

## 2012-05-25 MED ORDER — LACTATED RINGERS IV SOLN
INTRAVENOUS | Status: DC | PRN
  Administered 2012-05-25: 15:00:00 via INTRAVENOUS

## 2012-05-25 MED ORDER — SUCCINYLCHOLINE CHLORIDE 20 MG/ML IJ SOLN
INTRAMUSCULAR | Status: DC | PRN
  Administered 2012-05-25: 100 mg via INTRAVENOUS

## 2012-05-25 MED ORDER — MIDAZOLAM HCL 5 MG/5ML IJ SOLN
INTRAMUSCULAR | Status: DC | PRN
  Administered 2012-05-25: 1 mg via INTRAVENOUS

## 2012-05-25 MED ORDER — ONDANSETRON HCL 4 MG/2ML IJ SOLN
4.0000 mg | Freq: Four times a day (QID) | INTRAMUSCULAR | Status: DC | PRN
  Administered 2012-05-26 – 2012-06-04 (×6): 4 mg via INTRAVENOUS
  Filled 2012-05-25 (×6): qty 2

## 2012-05-25 MED ORDER — 0.9 % SODIUM CHLORIDE (POUR BTL) OPTIME
TOPICAL | Status: DC | PRN
  Administered 2012-05-25: 3000 mL

## 2012-05-25 MED ORDER — ALBUTEROL SULFATE (5 MG/ML) 0.5% IN NEBU
2.5000 mg | INHALATION_SOLUTION | Freq: Four times a day (QID) | RESPIRATORY_TRACT | Status: DC
  Administered 2012-05-25: 2.5 mg via RESPIRATORY_TRACT
  Filled 2012-05-25 (×2): qty 0.5

## 2012-05-25 MED ORDER — SODIUM CHLORIDE 0.9 % IV SOLN
1.0000 g | INTRAVENOUS | Status: DC
  Filled 2012-05-25: qty 1

## 2012-05-25 MED ORDER — ACETAMINOPHEN 10 MG/ML IV SOLN
1000.0000 mg | Freq: Four times a day (QID) | INTRAVENOUS | Status: DC
  Administered 2012-05-25: 1000 mg via INTRAVENOUS

## 2012-05-25 MED ORDER — MORPHINE SULFATE 2 MG/ML IJ SOLN
2.0000 mg | Freq: Once | INTRAMUSCULAR | Status: AC
  Administered 2012-05-25: 2 mg via INTRAVENOUS
  Filled 2012-05-25: qty 1

## 2012-05-25 MED ORDER — IOHEXOL 300 MG/ML  SOLN
80.0000 mL | Freq: Once | INTRAMUSCULAR | Status: AC | PRN
  Administered 2012-05-25: 80 mL via INTRAVENOUS

## 2012-05-25 MED ORDER — GLYCOPYRROLATE 0.2 MG/ML IJ SOLN
INTRAMUSCULAR | Status: DC | PRN
  Administered 2012-05-25: .4 mg via INTRAVENOUS

## 2012-05-25 MED ORDER — PANTOPRAZOLE SODIUM 40 MG IV SOLR
40.0000 mg | INTRAVENOUS | Status: DC
  Administered 2012-05-25 – 2012-05-29 (×5): 40 mg via INTRAVENOUS
  Filled 2012-05-25 (×6): qty 40

## 2012-05-25 MED ORDER — LACTATED RINGERS IV SOLN
INTRAVENOUS | Status: DC
  Administered 2012-05-25 (×2): via INTRAVENOUS
  Administered 2012-05-25: 1000 mL via INTRAVENOUS

## 2012-05-25 MED ORDER — SODIUM CHLORIDE 0.9 % IJ SOLN
3.0000 mL | Freq: Two times a day (BID) | INTRAMUSCULAR | Status: DC
  Filled 2012-05-25: qty 3

## 2012-05-25 MED ORDER — BUPIVACAINE HCL (PF) 0.5 % IJ SOLN
INTRAMUSCULAR | Status: DC | PRN
  Administered 2012-05-25: 10 mL

## 2012-05-25 MED ORDER — PHENYLEPHRINE HCL 10 MG/ML IJ SOLN
10.0000 mg | INTRAVENOUS | Status: DC | PRN
  Administered 2012-05-25: 10 ug/min via INTRAVENOUS

## 2012-05-25 MED ORDER — MORPHINE SULFATE 2 MG/ML IJ SOLN
1.0000 mg | INTRAMUSCULAR | Status: DC | PRN
  Administered 2012-05-25: 1 mg via INTRAVENOUS
  Filled 2012-05-25 (×2): qty 1

## 2012-05-25 MED ORDER — SODIUM CHLORIDE 0.9 % IV SOLN
1.0000 g | INTRAVENOUS | Status: DC
  Administered 2012-05-26 – 2012-05-31 (×6): 1 g via INTRAVENOUS
  Filled 2012-05-25 (×6): qty 1

## 2012-05-25 MED ORDER — HYDROMORPHONE HCL PF 1 MG/ML IJ SOLN
0.5000 mg | INTRAMUSCULAR | Status: DC | PRN
  Administered 2012-05-25 – 2012-05-27 (×19): 1 mg via INTRAVENOUS
  Administered 2012-05-28: 0.5 mg via INTRAVENOUS
  Administered 2012-05-28 – 2012-06-04 (×15): 1 mg via INTRAVENOUS
  Filled 2012-05-25 (×35): qty 1

## 2012-05-25 MED ORDER — MORPHINE SULFATE 4 MG/ML IJ SOLN
4.0000 mg | Freq: Once | INTRAMUSCULAR | Status: AC
  Administered 2012-05-25: 4 mg via INTRAVENOUS
  Filled 2012-05-25: qty 1

## 2012-05-25 MED ORDER — SODIUM CHLORIDE 0.9 % IV SOLN
1.0000 g | Freq: Once | INTRAVENOUS | Status: AC
  Administered 2012-05-25: 1 g via INTRAVENOUS
  Filled 2012-05-25: qty 1

## 2012-05-25 SURGICAL SUPPLY — 62 items
APL SKNCLS STERI-STRIP NONHPOA (GAUZE/BANDAGES/DRESSINGS) ×1
APPLIER CLIP 5 13 M/L LIGAMAX5 (MISCELLANEOUS)
APPLIER CLIP ROT 10 11.4 M/L (STAPLE)
APR CLP MED LRG 11.4X10 (STAPLE)
APR CLP MED LRG 5 ANG JAW (MISCELLANEOUS)
BAG SPEC RTRVL LRG 6X4 10 (ENDOMECHANICALS)
BENZOIN TINCTURE PRP APPL 2/3 (GAUZE/BANDAGES/DRESSINGS) ×2 IMPLANT
CANISTER SUCTION 2500CC (MISCELLANEOUS) ×2 IMPLANT
CHLORAPREP W/TINT 26ML (MISCELLANEOUS) ×2 IMPLANT
CLIP APPLIE 5 13 M/L LIGAMAX5 (MISCELLANEOUS) IMPLANT
CLIP APPLIE ROT 10 11.4 M/L (STAPLE) IMPLANT
CLOTH BEACON ORANGE TIMEOUT ST (SAFETY) ×2 IMPLANT
CUTTER FLEX LINEAR 45M (STAPLE) ×1 IMPLANT
DECANTER SPIKE VIAL GLASS SM (MISCELLANEOUS) ×2 IMPLANT
DRAIN CHANNEL 19F RND (DRAIN) ×1 IMPLANT
DRAPE LAPAROSCOPIC ABDOMINAL (DRAPES) ×2 IMPLANT
DRAPE UTILITY XL STRL (DRAPES) ×2 IMPLANT
DRSG TEGADERM 2-3/8X2-3/4 SM (GAUZE/BANDAGES/DRESSINGS) ×6 IMPLANT
ELECT BLADE TIP CTD 4 INCH (ELECTRODE) ×1 IMPLANT
ELECT REM PT RETURN 9FT ADLT (ELECTROSURGICAL) ×2
ELECTRODE REM PT RTRN 9FT ADLT (ELECTROSURGICAL) ×1 IMPLANT
ENDOLOOP SUT PDS II  0 18 (SUTURE)
ENDOLOOP SUT PDS II 0 18 (SUTURE) IMPLANT
EVACUATOR SILICONE 100CC (DRAIN) ×1 IMPLANT
GLOVE BIOGEL PI IND STRL 7.0 (GLOVE) ×1 IMPLANT
GLOVE BIOGEL PI INDICATOR 7.0 (GLOVE) ×1
GLOVE ECLIPSE 8.0 STRL XLNG CF (GLOVE) ×2 IMPLANT
GLOVE INDICATOR 8.0 STRL GRN (GLOVE) ×4 IMPLANT
GOWN STRL NON-REIN LRG LVL3 (GOWN DISPOSABLE) ×2 IMPLANT
GOWN STRL REIN XL XLG (GOWN DISPOSABLE) ×4 IMPLANT
KIT BASIN OR (CUSTOM PROCEDURE TRAY) ×2 IMPLANT
PENCIL BUTTON HOLSTER BLD 10FT (ELECTRODE) ×2 IMPLANT
POUCH SPECIMEN RETRIEVAL 10MM (ENDOMECHANICALS) IMPLANT
RELOAD 45 VASCULAR/THIN (ENDOMECHANICALS) IMPLANT
RELOAD PROXIMATE 75MM BLUE (ENDOMECHANICALS) ×2 IMPLANT
RELOAD STAPLE 45 2.5 WHT GRN (ENDOMECHANICALS) IMPLANT
RELOAD STAPLE 45 3.5 BLU ETS (ENDOMECHANICALS) IMPLANT
RELOAD STAPLE 75 3.8 BLU REG (ENDOMECHANICALS) IMPLANT
RELOAD STAPLE TA45 3.5 REG BLU (ENDOMECHANICALS) IMPLANT
RELOAD STAPLER LINEAR PROX 30 (STAPLE) ×1 IMPLANT
SCALPEL HARMONIC ACE (MISCELLANEOUS) ×1 IMPLANT
SET IRRIG TUBING LAPAROSCOPIC (IRRIGATION / IRRIGATOR) ×2 IMPLANT
SOLUTION ANTI FOG 6CC (MISCELLANEOUS) ×2 IMPLANT
SPONGE LAP 4X18 X RAY DECT (DISPOSABLE) ×3 IMPLANT
STAPLER PROXIMATE 75MM BLUE (STAPLE) ×1 IMPLANT
STAPLER RELOAD LINEAR PROX 30 (STAPLE) ×2
STAPLER RELOADABLE 30 BLU REG (STAPLE) IMPLANT
STRIP CLOSURE SKIN 1/2X4 (GAUZE/BANDAGES/DRESSINGS) ×2 IMPLANT
SUT ETHILON 3 0 PS 1 (SUTURE) ×1 IMPLANT
SUT MNCRL AB 4-0 PS2 18 (SUTURE) ×2 IMPLANT
SUT PDS AB 1 TP1 96 (SUTURE) ×2 IMPLANT
SUT VIC AB 3-0 SH 8-18 (SUTURE) ×2 IMPLANT
SUT VICRYL 2 0 18  UND BR (SUTURE) ×1
SUT VICRYL 2 0 18 UND BR (SUTURE) IMPLANT
SUT VICRYL 3 0 BR 18  UND (SUTURE) ×1
SUT VICRYL 3 0 BR 18 UND (SUTURE) IMPLANT
TOWEL OR 17X26 10 PK STRL BLUE (TOWEL DISPOSABLE) ×2 IMPLANT
TRAY FOLEY CATH 14FRSI W/METER (CATHETERS) ×2 IMPLANT
TRAY LAP CHOLE (CUSTOM PROCEDURE TRAY) ×2 IMPLANT
TROCAR BLADELESS OPT 5 75 (ENDOMECHANICALS) ×4 IMPLANT
TROCAR XCEL BLUNT TIP 100MML (ENDOMECHANICALS) IMPLANT
TUBING INSUFFLATION 10FT LAP (TUBING) ×2 IMPLANT

## 2012-05-25 NOTE — ED Notes (Signed)
Family at bedside. 

## 2012-05-25 NOTE — Progress Notes (Signed)
Wylene Men, RN in [place] aware that pt's temp increased to 101.4 from 99.4 since arrival to unit. Pain eased off since receiving recent analgesic. Lorenzo, OR tech in to transport pt to surgery. Jeanice Lim also made aware of other VS to report to preop and anesthesia.

## 2012-05-25 NOTE — ED Provider Notes (Addendum)
[name] is a 72 y.o. female presenting with generalized abdominal pain localizing somewhat the epigastrium and the recorded. She's had nausea vomiting. Is has been severe, unrelenting, worsening since Saturday, she initially blamed some fish she ate however no one else in her family had any other similar symptoms. Patient's labs are unremarkable, she is afebrile, but on my review the CAT scan on radiology read, she does have appendicitis. I discussed with Dr. Magnus Ivan of [facility], patient to be admitted for surgical intervention. Patient is given antibiotics (ertapenem) & some fluids - treating pain aggressively.   Date: 05/25/2012  Rate: 78  Rhythm: normal sinus rhythm  QRS Axis: normal  Intervals: normal  ST/T Wave abnormalities: normal  Conduction Disutrbances: none  Narrative Interpretation: unremarkable - no significant changes when compared with prior EKG 10/27/2010    Jones Skene, MD 05/25/12 [id number]  Jones Skene, MD 05/25/12 [id number]

## 2012-05-25 NOTE — ED Notes (Signed)
Pt escorted by NT to BR upon return to bed pt RA sat 80%. Quickly up to 98 on 2L 02.

## 2012-05-25 NOTE — ED Provider Notes (Signed)
History     CSN: [id number]  Arrival date & time 05/25/12  0049   First MD Initiated Contact with Patient 05/25/12 0209      Chief Complaint  Patient presents with  . Abdominal Pain  . Emesis   HPI  History provided by the patient and daughter. Patient is a 72 year old female with history of hypertension, hyperlipidemia, COPD, renal insufficiency who presents with complaints of general abdominal pain and discomfort with associated nausea vomiting symptoms. Symptoms first began around Saturday and Sunday and have been persistent through the first part of this week. Patient initially felt that symptoms were caused from eating fish however no other family members who had the same meal had any similar symptoms. She does report some increased frequency of stools with soft stool but is not described as his diarrhea. There's been no blood or mucus in stools. Patient states pain feels the worst around the lower abdomen radiating across bilaterally and somewhat into the lower back. She has had several episodes of vomiting mostly clear liquid from the stomach contents. No hematemesis. She denies any subjective fevers, chills or sweats. No other aggravating or alleviating factors. No other associated symptoms.     Past Medical History  Diagnosis Date  . HYPERLIPIDEMIA 11/18/2006  . HYPERKALEMIA 02/05/2008  . ANEMIA-IRON DEFICIENCY 09/22/2008  . Anemia of other chronic disease 02/05/2008  . ANXIETY 06/03/2008  . DEPRESSION 04/28/2009  . HYPERTENSION 11/18/2006  . PERICARDITIS 01/03/2007  . AORTIC STENOSIS/ INSUFFICIENCY, NON-RHEUMATIC 12/03/2008  . SINUSITIS- ACUTE-NOS 11/20/2007  . SINUSITIS, CHRONIC 02/05/2008  . ALLERGIC RHINITIS 11/18/2006  . PNEUMONIA 05/02/2007  . C O P D 06/27/2008  . Stricture and stenosis of esophagus 11/14/2008  . GERD 01/03/2007  . BARRETTS ESOPHAGUS 11/14/2008  . RENAL INSUFFICIENCY 02/05/2008  . DYSPEPSIA 03/23/2007  . PRURITUS 10/08/2008  . OSTEOARTHRITIS 11/18/2006  .  OSTEOARTHRITIS, KNEE, LEFT 01/03/2007  . Cervicalgia 01/13/2009  . BACK PAIN 02/04/2009  . BURSITIS, LEFT HIP 04/18/2009  . OSTEOPOROSIS 11/18/2006  . INSOMNIA-SLEEP DISORDER-UNSPEC 06/03/2008  . FATIGUE 04/18/2009  . PERIPHERAL EDEMA 02/13/2009  . MURMUR 11/18/2006  . DYSPHAGIA UNSPECIFIED 03/23/2007  . Abdominal pain, generalized 03/01/2007  . Nonspecific (abnormal) findings on radiological and other examination of body structure 06/03/2008  . Tuberculin Test Reaction 11/18/2006  . Acute bronchitis 02/23/2010  . CHOLELITHIASIS 04/24/2010  . NEPHROLITHIASIS, HX OF 04/24/2010  . Polyarthralgia 06/09/2010  . Elevated sed rate 06/11/2010  . Positive ANA (antinuclear antibody) 06/11/2010  . Rheumatoid factor positive 06/11/2010  . Cancer     lung ca  . 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 SPONDYLOSIS, CERVICAL, WITH RADICULOPATHY 01/13/2009  . Cervical radiculopathy 09/28/2010  . Lumbar radiculopathy 09/28/2010    Past Surgical History  Procedure Laterality Date  . Abdominal hysterectomy    . Tubal ligation    . Tumor removal  08/06/08    from lungs  . Anterior cervical decomp/discectomy fusion  01/15/2011    Procedure: ANTERIOR CERVICAL DECOMPRESSION/DISCECTOMY FUSION 2 LEVELS;  Surgeon: Kathaleen Maser Pool;  Location: [facility];  Service: Neurosurgery;  Laterality: N/A;  Cervical Three-Four, Cervical Four-Five Anterior Cervical Decompression Fusion   . Colonscopy    . Nose surgery      Family History  Problem Relation Age of Onset  . Hyperlipidemia Other   . Hypertension Other   . Coronary artery disease Other     several nephrew  . Heart disease Father   . Colon cancer Neg Hx   . Esophageal  cancer Neg Hx   . Rectal cancer Neg Hx   . Stomach cancer Neg Hx     History  Substance Use Topics  . Smoking status: Former Smoker -- 1.50 packs/day for 40 years    Types: Cigarettes    Quit date: 02/15/2006  . Smokeless tobacco: Former Neurosurgeon  . Alcohol Use: No    OB History   Grav Para  Term Preterm Abortions TAB SAB Ect Mult Living                  Review of Systems  Constitutional: Positive for appetite change. Negative for fever and chills.  Respiratory: Negative for cough and shortness of breath.   Cardiovascular: Negative for chest pain.  Gastrointestinal: Positive for nausea, vomiting and abdominal pain. Negative for diarrhea, constipation and blood in stool.  Genitourinary: Negative for dysuria, frequency, hematuria and flank pain.  All other systems reviewed and are negative.    Allergies  Amoxicillin; Fentanyl; Hydrocodone; Codeine; and Penicillins  Home Medications   Current Outpatient Rx  Name  Route  Sig  Dispense  Refill  . ALPRAZolam (XANAX) 0.5 MG tablet   Oral   Take 0.5 mg by mouth daily as needed for anxiety.         Marland Kitchen amLODipine (NORVASC) 5 MG tablet   Oral   Take 1 tablet (5 mg total) by mouth daily.   90 tablet   3   . Cholecalciferol (VITAMIN D3) 1000 UNITS CAPS   Oral   Take 1 capsule by mouth daily.          . citalopram (CELEXA) 10 MG tablet   Oral   Take 1 tablet (10 mg total) by mouth daily.   90 tablet   3   . furosemide (LASIX) 40 MG tablet   Oral   Take 40 mg by mouth 2 (two) times daily.         Marland Kitchen HYDROcodone-acetaminophen (NORCO) 10-325 MG per tablet      TAKE 1 TABLET BY MOUTH EVERY 6 HOURS AS NEEDED FOR PAIN   120 tablet   1   . metoprolol succinate (TOPROL-XL) 25 MG 24 hr tablet   Oral   Take 1 tablet (25 mg total) by mouth daily.   90 tablet   3   . pantoprazole (PROTONIX) 40 MG tablet      TAKE 1 TABLET (40 MG TOTAL) BY MOUTH DAILY.   90 tablet   3   . pravastatin (PRAVACHOL) 40 MG tablet   Oral   Take 1 tablet (40 mg total) by mouth daily.   90 tablet   3   . zolpidem (AMBIEN) 5 MG tablet   Oral   Take 1 tablet (5 mg total) by mouth at bedtime as needed. For sleep.   30 tablet   5   . albuterol (PROVENTIL HFA;VENTOLIN HFA) 108 (90 BASE) MCG/ACT inhaler   Inhalation   Inhale 1-2  puffs into the lungs 4 (four) times daily as needed. For wheezing or shortness of breath.         Marland Kitchen alendronate (FOSAMAX) 70 MG tablet   Oral   Take 1 tablet (70 mg total) by mouth every 7 (seven) days. Take with a full glass of water on an empty stomach. Patient takes on Mondays.   4 tablet   11     BP 148/56  Pulse 62  Temp(Src) 98.6 F (37 C) (Oral)  Resp 20  SpO2 93%  Physical  Exam  Nursing note and vitals reviewed. Constitutional: She is oriented to person, place, and time. She appears well-developed and well-nourished. No distress.  HENT:  Head: Normocephalic.  Cardiovascular: Normal rate and regular rhythm.   Murmur heard. Pulmonary/Chest: Effort normal and breath sounds normal. No respiratory distress. She has no wheezes. She has no rales.  Abdominal: Soft. She exhibits no distension and no mass. Bowel sounds are decreased. There is no hepatosplenomegaly. There is generalized tenderness. There is no rebound, no guarding and no CVA tenderness.  Diffuse abdominal tenderness per patient this is generally equal but slightly greater on the left side.  Musculoskeletal: Normal range of motion.  Neurological: She is alert and oriented to person, place, and time.  Skin: Skin is warm and dry. No rash noted.  Psychiatric: She has a normal mood and affect. Her behavior is normal.    ED Course  Procedures   Results for orders placed during the hospital encounter of 05/25/12  CBC WITH DIFFERENTIAL      Result Value Range   WBC 8.6  4.0 - 10.5 K/uL   RBC 4.06  3.87 - 5.11 MIL/uL   Hemoglobin 13.3  12.0 - 15.0 g/dL   HCT 16.1  09.6 - 04.5 %   MCV 99.0  78.0 - 100.0 fL   MCH 32.8  26.0 - 34.0 pg   MCHC 33.1  30.0 - 36.0 g/dL   RDW 40.9  81.1 - 91.4 %   Platelets 204  150 - 400 K/uL   Neutrophils Relative 80 (*) 43 - 77 %   Neutro Abs 6.8  1.7 - 7.7 K/uL   Lymphocytes Relative 11 (*) 12 - 46 %   Lymphs Abs 0.9  0.7 - 4.0 K/uL   Monocytes Relative 9  3 - 12 %   Monocytes  Absolute 0.8  0.1 - 1.0 K/uL   Eosinophils Relative 1  0 - 5 %   Eosinophils Absolute 0.0  0.0 - 0.7 K/uL   Basophils Relative 0  0 - 1 %   Basophils Absolute 0.0  0.0 - 0.1 K/uL  COMPREHENSIVE METABOLIC PANEL      Result Value Range   Sodium 137  135 - 145 mEq/L   Potassium 4.6  3.5 - 5.1 mEq/L   Chloride 97  96 - 112 mEq/L   CO2 26  19 - 32 mEq/L   Glucose, Bld 108 (*) 70 - 99 mg/dL   BUN 29 (*) 6 - 23 mg/dL   Creatinine, Ser 7.82 (*) 0.50 - 1.10 mg/dL   Calcium 9.1  8.4 - 95.6 mg/dL   Total Protein 8.1  6.0 - 8.3 g/dL   Albumin 4.0  3.5 - 5.2 g/dL   AST 14  0 - 37 U/L   ALT 11  0 - 35 U/L   Alkaline Phosphatase 52  39 - 117 U/L   Total Bilirubin 0.4  0.3 - 1.2 mg/dL   GFR calc non Af Amer 34 (*) >90 mL/min   GFR calc Af Amer 39 (*) >90 mL/min  LIPASE, BLOOD      Result Value Range   Lipase 27  11 - 59 U/L  URINALYSIS, ROUTINE W REFLEX MICROSCOPIC      Result Value Range   Color, Urine YELLOW  YELLOW   APPearance CLEAR  CLEAR   Specific Gravity, Urine 1.017  1.005 - 1.030   pH 6.0  5.0 - 8.0   Glucose, UA NEGATIVE  NEGATIVE mg/dL   Hgb  urine dipstick NEGATIVE  NEGATIVE   Bilirubin Urine NEGATIVE  NEGATIVE   Ketones, ur NEGATIVE  NEGATIVE mg/dL   Protein, ur NEGATIVE  NEGATIVE mg/dL   Urobilinogen, UA 0.2  0.0 - 1.0 mg/dL   Nitrite NEGATIVE  NEGATIVE   Leukocytes, UA SMALL (*) NEGATIVE  URINE MICROSCOPIC-ADD ON      Result Value Range   Squamous Epithelial / LPF RARE  RARE   WBC, UA 0-2  <3 WBC/hpf       Ct Abdomen Pelvis W Contrast  05/25/2012  *RADIOLOGY REPORT*  Clinical Data: Mid abdominal pain, nausea, vomiting and loose stools.  CT ABDOMEN AND PELVIS WITH CONTRAST  Technique:  Multidetector CT imaging of the abdomen and pelvis was performed following the standard protocol during bolus administration of intravenous contrast.  Contrast: 80mL OMNIPAQUE IOHEXOL 300 MG/ML  SOLN  Comparison: PET/CT performed 06/27/2008, and MRI of the lumbar spine performed  10/24/2010; abdominal ultrasound performed 03/09/2007  Findings: The visualized lung bases are clear.  There is mild nonspecific prominence of the intrahepatic biliary ducts.  The liver is otherwise unremarkable in appearance.  The spleen is within normal limits.  Scattered small stones are seen layering dependently in the gallbladder; the gallbladder is otherwise unremarkable in appearance.  The pancreas and adrenal glands are unremarkable.  Small bilateral renal stones are seen, measuring up to 5 mm in size.  Tiny hypodensities within both kidneys likely reflect small cysts.  The kidneys are otherwise unremarkable in appearance. Minimal nonspecific perinephric stranding is noted bilaterally. There is no evidence of hydronephrosis.  No obstructing ureteral stones are seen.  No free fluid is identified.  The small bowel is unremarkable in appearance.  A tiny hiatal hernia is noted; the stomach is otherwise unremarkable.  No acute vascular abnormalities are seen. Relatively diffuse calcification is noted along the abdominal aorta and its branches.  The appendix is dilated to 1.3 cm in maximal diameter, with surrounding soft tissue inflammation and trace fluid.  Trace associated fluid is noted within the pelvis.  This is compatible with acute appendicitis.  There is no definite evidence for perforation or abscess formation at this time.  Scattered diverticulosis is noted about the entirety of the colon, more prominent along the distal ascending and transverse colon. The colon is otherwise grossly unremarkable in appearance.  The bladder is moderately distended and grossly unremarkable.  The patient is status post hysterectomy.  No suspicious adnexal masses are seen.  No inguinal lymphadenopathy is seen.  No acute osseous abnormalities are identified.  Vacuum phenomenon is noted at L4-L5.  IMPRESSION:  1.  Acute appendicitis, with dilatation of the appendix to 1.3 cm in diameter and surrounding soft tissue  inflammation.  Trace fluid noted about the appendix, and within the pelvis.  No evidence for perforation or abscess formation at this time. 2.  Scattered diverticulosis noted about the entirety of the colon, more prominent along the distal ascending and transverse colon. 3.  Small bilateral renal stones, measuring up to 5 mm in size. Tiny bilateral renal cysts seen. 4.  Tiny hiatal hernia seen. 5.  Relatively diffuse calcification along the abdominal aorta and its branches. 6.  Cholelithiasis again noted; gallbladder otherwise unremarkable in appearance.  These results were called by telephone on 05/25/2012 at 05:39 a.m. to [facility], who verbally acknowledged these results.   Original Report Authenticated By: Tonia Ghent, M.D.      1. Acute appendicitis       MDM  2:15 AM  patient seen and evaluated. Patient appears in moderate discomfort but no acute distress.  Patient continuing to have pain after first small dose of morphine. Will repeat with 4 mg. Labs this point are unremarkable. CT still pending.  Patient continues to have some pain will repeat medication. Improved nausea and vomiting.  5:40AM Patient was seen and discussed with attending physician. Spoke with radiologist regarding CT scan results. Patient has acute appendicitis. There are small gallstones within the gallbladder without cholecystitis.  5:55 AM spoke with Dr. Magnus Ivan on call general surgery. They'll be in to see patient requests some antibiotics. Invanz ordered.      Angus Seller, PA-C 05/25/12 [phone]

## 2012-05-25 NOTE — Consult Note (Signed)
Reason for Consult: Acute appendicitis  Referring Physician: Dr. Henrene Dodge  [name] is an 72 y.o. female.  HPI: 72 year old female with history of hypertension, hyperlipidemia, COPD, renal insufficiency who presents with complaints of general abdominal pain associated with nausea, vomiting. Symptoms first began around Saturday and Sunday and have been persistent through the first part of this week. Patient initially felt that symptoms were caused from eating fish however no other family members who had the same meal had any similar symptoms. She does report some increased frequency of stools with soft stool but is not described as his diarrhea. There's been no blood or mucus in stools. Patient states pain feels the worst around the lower abdomen radiating across bilaterally and somewhat into the lower back. She has had several episodes of vomiting mostly clear liquid from the stomach contents. No hematemesis. No other aggravating or alleviating factors. No other associated symptoms.   Past Medical History  Diagnosis Date  . HYPERLIPIDEMIA 11/18/2006  . HYPERKALEMIA 02/05/2008  . ANEMIA-IRON DEFICIENCY 09/22/2008  . Anemia of other chronic disease 02/05/2008  . ANXIETY 06/03/2008  . DEPRESSION 04/28/2009  . HYPERTENSION 11/18/2006  . PERICARDITIS 01/03/2007  . AORTIC STENOSIS/ INSUFFICIENCY, NON-RHEUMATIC 12/03/2008  . SINUSITIS- ACUTE-NOS 11/20/2007  . SINUSITIS, CHRONIC 02/05/2008  . ALLERGIC RHINITIS 11/18/2006  . PNEUMONIA 05/02/2007  . C O P D 06/27/2008  . Stricture and stenosis of esophagus 11/14/2008  . GERD 01/03/2007  . BARRETTS ESOPHAGUS 11/14/2008  . RENAL INSUFFICIENCY 02/05/2008  . DYSPEPSIA 03/23/2007  . PRURITUS 10/08/2008  . OSTEOARTHRITIS 11/18/2006  . OSTEOARTHRITIS, KNEE, LEFT 01/03/2007  . Cervicalgia 01/13/2009  . BACK PAIN 02/04/2009  . BURSITIS, LEFT HIP 04/18/2009  . OSTEOPOROSIS 11/18/2006  . INSOMNIA-SLEEP DISORDER-UNSPEC 06/03/2008  . FATIGUE 04/18/2009  .  PERIPHERAL EDEMA 02/13/2009  . MURMUR 11/18/2006  . DYSPHAGIA UNSPECIFIED 03/23/2007  . Abdominal pain, generalized 03/01/2007  . Nonspecific (abnormal) findings on radiological and other examination of body structure 06/03/2008  . Tuberculin Test Reaction 11/18/2006  . Acute bronchitis 02/23/2010  . CHOLELITHIASIS 04/24/2010  . NEPHROLITHIASIS, HX OF 04/24/2010  . Polyarthralgia 06/09/2010  . Elevated sed rate 06/11/2010  . Positive ANA (antinuclear antibody) 06/11/2010  . Rheumatoid factor positive 06/11/2010  . Cancer     lung ca  . 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 SPONDYLOSIS, CERVICAL, WITH RADICULOPATHY 01/13/2009  . Cervical radiculopathy 09/28/2010  . Lumbar radiculopathy 09/28/2010    Past Surgical History  Procedure Laterality Date  . Abdominal hysterectomy    . Tubal ligation    . Tumor removal  08/06/08    from lungs  . Anterior cervical decomp/discectomy fusion  01/15/2011    Procedure: ANTERIOR CERVICAL DECOMPRESSION/DISCECTOMY FUSION 2 LEVELS;  Surgeon: Kathaleen Maser Pool;  Location: [facility];  Service: Neurosurgery;  Laterality: N/A;  Cervical Three-Four, Cervical Four-Five Anterior Cervical Decompression Fusion   . Colonscopy    . Nose surgery      Family History  Problem Relation Age of Onset  . Hyperlipidemia Other   . Hypertension Other   . Coronary artery disease Other     several nephrew  . Heart disease Father   . Colon cancer Neg Hx   . Esophageal cancer Neg Hx   . Rectal cancer Neg Hx   . Stomach cancer Neg Hx     Social History:  reports that she quit smoking about 6 years ago. Her smoking use included Cigarettes. She has a 60 pack-year smoking history. She has quit  using smokeless tobacco. She reports that she does not drink alcohol or use illicit drugs.  Allergies:  Allergies  Allergen Reactions  . Amoxicillin Itching  . Fentanyl Other (See Comments)    hallucinations  . Hydrocodone Other (See Comments)    ineffective  . Codeine Hives and Rash   . Penicillins Hives and Rash    Medications: I have reviewed the patient's current medications.  Results for orders placed during the hospital encounter of 05/25/12 (from the past 48 hour(s))  CBC WITH DIFFERENTIAL     Status: Abnormal   Collection Time    05/25/12  2:50 AM      Result Value Range   WBC 8.6  4.0 - 10.5 K/uL   RBC 4.06  3.87 - 5.11 MIL/uL   Hemoglobin 13.3  12.0 - 15.0 g/dL   HCT 16.1  09.6 - 04.5 %   MCV 99.0  78.0 - 100.0 fL   MCH 32.8  26.0 - 34.0 pg   MCHC 33.1  30.0 - 36.0 g/dL   RDW 40.9  81.1 - 91.4 %   Platelets 204  150 - 400 K/uL   Neutrophils Relative 80 (*) 43 - 77 %   Neutro Abs 6.8  1.7 - 7.7 K/uL   Lymphocytes Relative 11 (*) 12 - 46 %   Lymphs Abs 0.9  0.7 - 4.0 K/uL   Monocytes Relative 9  3 - 12 %   Monocytes Absolute 0.8  0.1 - 1.0 K/uL   Eosinophils Relative 1  0 - 5 %   Eosinophils Absolute 0.0  0.0 - 0.7 K/uL   Basophils Relative 0  0 - 1 %   Basophils Absolute 0.0  0.0 - 0.1 K/uL  COMPREHENSIVE METABOLIC PANEL     Status: Abnormal   Collection Time    05/25/12  2:50 AM      Result Value Range   Sodium 137  135 - 145 mEq/L   Potassium 4.6  3.5 - 5.1 mEq/L   Comment: SLIGHT HEMOLYSIS     HEMOLYSIS AT THIS LEVEL MAY AFFECT RESULT   Chloride 97  96 - 112 mEq/L   CO2 26  19 - 32 mEq/L   Glucose, Bld 108 (*) 70 - 99 mg/dL   BUN 29 (*) 6 - 23 mg/dL   Creatinine, Ser 7.82 (*) 0.50 - 1.10 mg/dL   Calcium 9.1  8.4 - 95.6 mg/dL   Total Protein 8.1  6.0 - 8.3 g/dL   Albumin 4.0  3.5 - 5.2 g/dL   AST 14  0 - 37 U/L   ALT 11  0 - 35 U/L   Alkaline Phosphatase 52  39 - 117 U/L   Total Bilirubin 0.4  0.3 - 1.2 mg/dL   GFR calc non Af Amer 34 (*) >90 mL/min   GFR calc Af Amer 39 (*) >90 mL/min   Comment:            The eGFR has been calculated     using the CKD EPI equation.     This calculation has not been     validated in all clinical     situations.     eGFR's persistently     <90 mL/min signify     possible Chronic Kidney  Disease.  LIPASE, BLOOD     Status: None   Collection Time    05/25/12  2:50 AM      Result Value Range   Lipase 27  11 -  59 U/L  URINALYSIS, ROUTINE W REFLEX MICROSCOPIC     Status: Abnormal   Collection Time    05/25/12  3:58 AM      Result Value Range   Color, Urine YELLOW  YELLOW   APPearance CLEAR  CLEAR   Specific Gravity, Urine 1.017  1.005 - 1.030   pH 6.0  5.0 - 8.0   Glucose, UA NEGATIVE  NEGATIVE mg/dL   Hgb urine dipstick NEGATIVE  NEGATIVE   Bilirubin Urine NEGATIVE  NEGATIVE   Ketones, ur NEGATIVE  NEGATIVE mg/dL   Protein, ur NEGATIVE  NEGATIVE mg/dL   Urobilinogen, UA 0.2  0.0 - 1.0 mg/dL   Nitrite NEGATIVE  NEGATIVE   Leukocytes, UA SMALL (*) NEGATIVE  URINE MICROSCOPIC-ADD ON     Status: None   Collection Time    05/25/12  3:58 AM      Result Value Range   Squamous Epithelial / LPF RARE  RARE   WBC, UA 0-2  <3 WBC/hpf    Ct Abdomen Pelvis W Contrast  05/25/2012  *RADIOLOGY REPORT*  Clinical Data: Mid abdominal pain, nausea, vomiting and loose stools.  CT ABDOMEN AND PELVIS WITH CONTRAST  Technique:  Multidetector CT imaging of the abdomen and pelvis was performed following the standard protocol during bolus administration of intravenous contrast.  Contrast: 80mL OMNIPAQUE IOHEXOL 300 MG/ML  SOLN  Comparison: PET/CT performed 06/27/2008, and MRI of the lumbar spine performed 10/24/2010; abdominal ultrasound performed 03/09/2007  Findings: The visualized lung bases are clear.  There is mild nonspecific prominence of the intrahepatic biliary ducts.  The liver is otherwise unremarkable in appearance.  The spleen is within normal limits.  Scattered small stones are seen layering dependently in the gallbladder; the gallbladder is otherwise unremarkable in appearance.  The pancreas and adrenal glands are unremarkable.  Small bilateral renal stones are seen, measuring up to 5 mm in size.  Tiny hypodensities within both kidneys likely reflect small cysts.  The kidneys are  otherwise unremarkable in appearance. Minimal nonspecific perinephric stranding is noted bilaterally. There is no evidence of hydronephrosis.  No obstructing ureteral stones are seen.  No free fluid is identified.  The small bowel is unremarkable in appearance.  A tiny hiatal hernia is noted; the stomach is otherwise unremarkable.  No acute vascular abnormalities are seen. Relatively diffuse calcification is noted along the abdominal aorta and its branches.  The appendix is dilated to 1.3 cm in maximal diameter, with surrounding soft tissue inflammation and trace fluid.  Trace associated fluid is noted within the pelvis.  This is compatible with acute appendicitis.  There is no definite evidence for perforation or abscess formation at this time.  Scattered diverticulosis is noted about the entirety of the colon, more prominent along the distal ascending and transverse colon. The colon is otherwise grossly unremarkable in appearance.  The bladder is moderately distended and grossly unremarkable.  The patient is status post hysterectomy.  No suspicious adnexal masses are seen.  No inguinal lymphadenopathy is seen.  No acute osseous abnormalities are identified.  Vacuum phenomenon is noted at L4-L5.  IMPRESSION:  1.  Acute appendicitis, with dilatation of the appendix to 1.3 cm in diameter and surrounding soft tissue inflammation.  Trace fluid noted about the appendix, and within the pelvis.  No evidence for perforation or abscess formation at this time. 2.  Scattered diverticulosis noted about the entirety of the colon, more prominent along the distal ascending and transverse colon. 3.  Small bilateral renal stones,  measuring up to 5 mm in size. Tiny bilateral renal cysts seen. 4.  Tiny hiatal hernia seen. 5.  Relatively diffuse calcification along the abdominal aorta and its branches. 6.  Cholelithiasis again noted; gallbladder otherwise unremarkable in appearance.  These results were called by telephone on  05/25/2012 at 05:39 a.m. to [facility] PA, who verbally acknowledged these results.   Original Report Authenticated By: Tonia Ghent, M.D.     Review of Systems  Constitutional: Positive for chills and malaise/fatigue. Negative for fever.  HENT: Negative.   Eyes: Negative.   Respiratory: Negative.   Cardiovascular: Negative.   Gastrointestinal: Positive for nausea, vomiting and abdominal pain. Negative for diarrhea and constipation.  Genitourinary: Negative.   Musculoskeletal: Negative.   Skin: Negative.   Neurological: Negative.   Endo/Heme/Allergies: Negative.   Psychiatric/Behavioral: Negative.    Blood pressure 155/54, pulse 79, temperature 98.2 F (36.8 C), temperature source Oral, resp. rate 18, SpO2 99.00%. Physical Exam  Constitutional: She is oriented to person, place, and time. She appears well-nourished. No distress.  Chronically ill appearing  HENT:  Head: Normocephalic and atraumatic.  Eyes: Conjunctivae are normal. Pupils are equal, round, and reactive to light.  Neck: Normal range of motion. Neck supple.  Cardiovascular: Normal rate and regular rhythm.   Respiratory:  No breath sounds heard in upper left lung fields  Decreased in bases bilateral  GI: Bowel sounds are normal. She exhibits no distension. There is tenderness (diffuse, not localized). There is rebound.  Genitourinary:  deferred  Musculoskeletal: She exhibits no edema.  Arthritic changes in upper extremities  Neurological: She is alert and oriented to person, place, and time.  Skin: Skin is warm and dry.  Psychiatric: She has a normal mood and affect. Her behavior is normal. Thought content normal.    Assessment/Plan: 1. Acute Appendicitis: the patient has significant co-morbidities for surgery including CAD, aortic stenosis, COPD, h/o lung cancer and CKD.  Her symptoms have been going since Sunday.  She needs appendectomy but will need to get cardiology, pulmonology and internal medicine to  clear.  She may be be a prohibitive candidate for surgery.  We would like to make that decision today so we are asking pulm and cards to see urgently in the ER to give [place] a risk stratification for surgery.  For now we have given her Invanz and will keep NPO, prn pain med and antiemetic.  Would ultimately like for internal medicine to admit this patient given her chronic medical problems.  [place] [facility] and [facility] have been contacted, awaiting call back from hospitalist.  [name] 05/25/2012, 7:50 AM

## 2012-05-25 NOTE — Progress Notes (Signed)
Dr. Daleen Squibb aware pt has order for telemetry cardiac monitoring. MD on unit to see pt and clear for surgery. No new orders received.

## 2012-05-25 NOTE — ED Notes (Signed)
Report called to [employer] on [address].

## 2012-05-25 NOTE — ED Notes (Signed)
Medical PA at bedside to see pt for admission.

## 2012-05-25 NOTE — Op Note (Signed)
Operative Note  [name] female 72 y.o. 05/25/2012  PREOPERATIVE DX:   Acute appendicitis  POSTOPERATIVE DX:  Perforated, gangrenous appendicitis  PROCEDURE:  Laparoscopy, exploratory laparotomy, lysis of adhesions, appendectomy, small bowel resection.         Surgeon: Adolph Pollack   Assistants: Karie Soda M.D.  Anesthesia: General endotracheal anesthesia  Indications: This is a 72 year old female whose been having abdominal pain nausea and bounding for 4 days. She presented to the emergency department and was found to have acute appendicitis. She has a number of significant medical comorbidities and was seen by appropriate specialist and felt to be an appropriate candidate for surgery. She was started on intravenous antibiotics. She is brought to the operating room for appendectomy.    Procedure Detail:  She is brought to the operating room placed supine on the operating table and a general anesthetic was given. A Foley catheter was inserted. The abdominal wall was widely sterilely prepped and draped.  Marcaine was infiltrated into the subumbilical region. A small subumbilical incision was made through the skin, subcutaneous tissue, fascia, and peritoneum entering the peritoneal cavity under direct vision. A pursestring suture of 0 Vicryl was placed around the edges of the fascia. A Hassan trocar was introduced into the peritoneal cavity and a pneumoperitoneum created by insufflation of CO2 gas.  The laparoscope was introduced and there is no evidence of bleeding or organ injury. Diffuse peritonitis could be seen along with purulent drainage from the right side of the abdomen extending down from the pelvis and also extending up to the perihepatic region. There were adhesions between the omentum and lower midline. A 5 mm trocar was placed in the right upper quadrant. A second 5 mm trocar was placed in the epigastrium. I was able to visualize the cecum upon retracting it up and  noticed a inflamed appendix. Purulent drainage was present in the pelvis. Using some hydrodissection and blunt dissection I began to try to mobilize the appendix. The appendix was densely adherent to the small bowel mesentery and a segment of the small bowel. In dissecting it free from the small bowel mesentery mesenteric bleeding ensued. The appendix was going down into the pelvis. I continue to try to free the appendix up and bring it more to the right upper quadrant. The appendix had evidence of gangrene and perforation with some stool leaking from it. At this point I elected to convert to a laparotomy.  A right lower quadrant incision was made through the skin, subcutaneous tissue, fascia layers, and peritoneum. Upon entering the peritoneal cavity I identified the appendix. I divided the mesial appendix with the harmonic scalpel. I ligated the appendiceal artery. I began trying to mobilize the small bowel mesentery. The bleeding points of the small bowel mesentery were sewn up. However some of the small bowel was involved in the process and was ischemic and was going to need to be resected. I amputated the appendix off the cecum with the linear noncutting stapling. I then imbricated the appendiceal stump with a 0 Vicryl suture.  Following this, I extended the incision medially and mobilized the small intestine by dividing adhesions between it and the bladder. There was an area of the small intestine that was ischemic. This was in the mid ileum. This needed to be resected.  I made small enterotomies in the viable portions of the small intestine proximal and distal to the area of ischemia. A side to side staple anastomosis was performed with the GIA stapler.  The common defect was closed and this also resected the segment of small intestine. The mesenteric defect was closed with interrupted 3-0 Vicryl sutures. A crotch stitch of 3-0 Vicryl was used. The anastomosis was patent, viable, and under no  tension.  The abdominal cavity was then copiously irrigated with warm saline solution. Bleeding points on the bladder were controlled with electrocautery. Once hemostasis was adequate I made a stab incision in the left lower quadrant. A 19 Blake drain was then placed into the abdominal cavity and positioned in the pelvis and then tracing up into the right gutter. The trocars were removed. The subumbilical fascial defect was closed by tightening up and tying the purse string suture.  The fascia of the right lower quadrant incision was closed in a single layer with running #1 looped PDS suture. The trocar site incisions were closed with staples. The right lower quadrant wound is left open and packed with moist gauze. Sterile dressings were applied. The drain was hooked up to bulb suction.  She tolerated the procedure fairly well without any apparent complications. She was taken to the critical care unit intubated and in critical condition.   Estimated Blood Loss:  900 cc         Drains: JACKSON-PRATT (JP)  Blood Given: none          Specimens: Appendix and small bowel        Complications:  * No complications entered in OR log *         Disposition: ICU - intubated and critically ill.         Condition: stable

## 2012-05-25 NOTE — ED Notes (Signed)
Surgical PA at bedside  

## 2012-05-25 NOTE — Consult Note (Signed)
Patient seen and examined.  She has been cleared by Pulmonary and Cardiology.  Will proceed with laparoscopic possible open appendectomy.  I have discussed the procedure and risks of appendectomy. The risks include but are not limited to bleeding, infection, wound problems, anesthesia, cardiopulmonary complications, injury to intra-abdominal organs, possibility of postoperative ileus and death.. She seems to understand and agrees with the plan.

## 2012-05-25 NOTE — ED Notes (Signed)
Spoke with pts daughter Rosezella [place], advised of surgery consult. She states she will be here ASAP

## 2012-05-25 NOTE — Transfer of Care (Signed)
Immediate Anesthesia Transfer of Care Note  Patient: [name]  Procedure(s) Performed: Procedure(s): APPENDECTOMY LAPAROSCOPIC  CONVERTED TO  OPEN APPENDECTOMY, exploratory laparatomy (N/A) SMALL BOWEL RESECTION (N/A) LAPAROSCOPIC LYSIS OF ADHESIONS (N/A)  Patient Location: PACU  Anesthesia Type:General  Level of Consciousness: awake, alert , patient cooperative and responds to stimulation  Airway & Oxygen Therapy: Patient Spontanous Breathing and Patient connected to face mask oxygen  Post-op Assessment: Report given to PACU RN, Post -op Vital signs reviewed and stable and Patient moving all extremities X 4  Post vital signs: stable  Complications: No apparent anesthesia complications

## 2012-05-25 NOTE — Consult Note (Signed)
PULMONARY  / CRITICAL CARE MEDICINE  Name: [name] MRN: [id number] DOB: [birth year]    ADMISSION DATE:  05/25/2012 CONSULTATION DATE:  05/25/2012  REFERRING MD : Elvera Lennox PRIMARY SERVICE: :[facility]  CHIEF COMPLAINT:  Abdominal Pain  CULTURES: 4/10- Blood Cultures x2>>>  ANTIBIOTICS: 4/10- Invanz>>>  HISTORY OF PRESENT ILLNESS:  72 y/o AF presented to ED with  worsening generalized abdominal pain onset 5 days ago, associated with N/V and appetite change. Patient has acute suspected appendicitis. [facility] consult for surgery clearance.  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 She is able to walk int he store & climb a flight of stairs at baseline She stoppe taking spiriva & wonders if she should restart  PAST MEDICAL HISTORY :  Past Medical History  Diagnosis Date  . HYPERLIPIDEMIA 11/18/2006  . HYPERKALEMIA 02/05/2008  . ANEMIA-IRON DEFICIENCY 09/22/2008  . Anemia of other chronic disease 02/05/2008  . ANXIETY 06/03/2008  . DEPRESSION 04/28/2009  . HYPERTENSION 11/18/2006  . PERICARDITIS 01/03/2007  . AORTIC STENOSIS/ INSUFFICIENCY, NON-RHEUMATIC 12/03/2008  . SINUSITIS- ACUTE-NOS 11/20/2007  . SINUSITIS, CHRONIC 02/05/2008  . ALLERGIC RHINITIS 11/18/2006  . PNEUMONIA 05/02/2007  . C O P D 06/27/2008  . Stricture and stenosis of esophagus 11/14/2008  . GERD 01/03/2007  . BARRETTS ESOPHAGUS 11/14/2008  . RENAL INSUFFICIENCY 02/05/2008  . DYSPEPSIA 03/23/2007  . PRURITUS 10/08/2008  . OSTEOARTHRITIS 11/18/2006  . OSTEOARTHRITIS, KNEE, LEFT 01/03/2007  . Cervicalgia 01/13/2009  . BACK PAIN 02/04/2009  . BURSITIS, LEFT HIP 04/18/2009  . OSTEOPOROSIS 11/18/2006  . INSOMNIA-SLEEP DISORDER-UNSPEC 06/03/2008  . FATIGUE 04/18/2009  . PERIPHERAL EDEMA 02/13/2009  . MURMUR 11/18/2006  . DYSPHAGIA UNSPECIFIED 03/23/2007  . Abdominal pain, generalized 03/01/2007  .  Nonspecific (abnormal) findings on radiological and other examination of body structure 06/03/2008  . Tuberculin Test Reaction 11/18/2006  . Acute bronchitis 02/23/2010  . CHOLELITHIASIS 04/24/2010  . NEPHROLITHIASIS, HX OF 04/24/2010  . Polyarthralgia 06/09/2010  . Elevated sed rate 06/11/2010  . Positive ANA (antinuclear antibody) 06/11/2010  . Rheumatoid factor positive 06/11/2010  . Cancer     lung ca  . 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 SPONDYLOSIS, CERVICAL, WITH RADICULOPATHY 01/13/2009  . Cervical radiculopathy 09/28/2010  . Lumbar radiculopathy 09/28/2010   Past Surgical History  Procedure Laterality Date  . Abdominal hysterectomy    . Tubal ligation    . Tumor removal  08/06/08    from lungs  . Anterior cervical decomp/discectomy fusion  01/15/2011    Procedure: ANTERIOR CERVICAL DECOMPRESSION/DISCECTOMY FUSION 2 LEVELS;  Surgeon: Kathaleen Maser Pool;  Location: [facility];  Service: Neurosurgery;  Laterality: N/A;  Cervical Three-Four, Cervical Four-Five Anterior Cervical Decompression Fusion   . Colonscopy    . Nose surgery     Prior to Admission medications   Medication Sig Start Date End Date Taking? Authorizing Provider  ALPRAZolam Prudy Feeler) 0.5 MG tablet Take 0.5 mg by mouth daily as needed for anxiety.   Yes Historical Provider, MD  amLODipine (NORVASC) 5 MG tablet Take 1 tablet (5 mg total) by mouth daily. 09/24/11 09/23/12 Yes Corwin Levins, MD  Cholecalciferol (VITAMIN D3) 1000 UNITS CAPS Take 1 capsule by mouth daily.    Yes Historical Provider, MD  citalopram (CELEXA) 10 MG tablet Take 1 tablet (10 mg total) by mouth daily. 09/24/11 09/23/12 Yes Len Blalock  John, MD  furosemide (LASIX) 40 MG tablet Take 40 mg by mouth 2 (two) times daily.   Yes Historical Provider, MD  HYDROcodone-acetaminophen (NORCO) 10-325 MG per tablet TAKE 1 TABLET BY MOUTH EVERY 6 HOURS AS NEEDED FOR PAIN 05/18/12  Yes Corwin Levins, MD  metoprolol succinate (TOPROL-XL) 25 MG 24 hr tablet Take 1 tablet (25 mg total)  by mouth daily. 01/25/12 01/24/13 Yes Kathleene Hazel, MD  pantoprazole (PROTONIX) 40 MG tablet TAKE 1 TABLET (40 MG TOTAL) BY MOUTH DAILY. 05/17/12  Yes Corwin Levins, MD  pravastatin (PRAVACHOL) 40 MG tablet Take 1 tablet (40 mg total) by mouth daily. 12/02/11  Yes Corwin Levins, MD  zolpidem (AMBIEN) 5 MG tablet Take 1 tablet (5 mg total) by mouth at bedtime as needed. For sleep. 04/25/12 04/25/13 Yes Corwin Levins, MD  albuterol (PROVENTIL HFA;VENTOLIN HFA) 108 (90 BASE) MCG/ACT inhaler Inhale 1-2 puffs into the lungs 4 (four) times daily as needed. For wheezing or shortness of breath. 01/11/11   Tammy S Parrett, NP  alendronate (FOSAMAX) 70 MG tablet Take 1 tablet (70 mg total) by mouth every 7 (seven) days. Take with a full glass of water on an empty stomach. Patient takes on Mondays. 03/28/12   Corwin Levins, MD   Allergies  Allergen Reactions  . Amoxicillin Itching  . Fentanyl Other (See Comments)    hallucinations  . Hydrocodone Other (See Comments)    ineffective  . Codeine Hives and Rash  . Penicillins Hives and Rash    FAMILY HISTORY:  Family History  Problem Relation Age of Onset  . Hyperlipidemia Other   . Hypertension Other   . Coronary artery disease Other     several nephrew  . Heart disease Father   . Colon cancer Neg Hx   . Esophageal cancer Neg Hx   . Rectal cancer Neg Hx   . Stomach cancer Neg Hx    SOCIAL HISTORY:  reports that she quit smoking about 6 years ago. Her smoking use included Cigarettes. She has a 60 pack-year smoking history. She has quit using smokeless tobacco. She reports that she does not drink alcohol or use illicit drugs.  REVIEW OF SYSTEMS:   Constitutional: negative for anorexia, fevers and sweats  Eyes: negative for irritation, redness and visual disturbance  Ears, nose, mouth, throat, and face: negative for earaches, epistaxis, nasal congestion and sore throat  Respiratory: negative for cough, dyspnea on exertion, sputum and wheezing   Cardiovascular: negative for chest pain, dyspnea, lower extremity edema, orthopnea, palpitations and syncope  Gastrointestinal: POSItive for abdominal pain, nausea and vomiting  NO constipation, diarrhea, melena  Genitourinary:negative for dysuria, frequency and hematuria  Hematologic/lymphatic: negative for bleeding, easy bruising and lymphadenopathy  Musculoskeletal:negative for arthralgias, muscle weakness and stiff joints  Neurological: negative for coordination problems, gait problems, headaches and weakness  Endocrine: negative for diabetic symptoms including polydipsia, polyuria and weight loss   SUBJECTIVE: denies CP, dyspnea  VITAL SIGNS: Temp:  [98.2 F (36.8 C)-98.6 F (37 C)] 98.2 F (36.8 C) (04/10 0556) Pulse Rate:  [62-79] 79 (04/10 0719) Resp:  [18-20] 18 (04/10 0719) BP: (144-155)/(54-56) 155/54 mmHg (04/10 0719) SpO2:  [93 %-99 %] 99 % (04/10 0719) HEMODYNAMICS:   VENTILATOR SETTINGS:   INTAKE / OUTPUT: Intake/Output   None     PHYSICAL EXAMINATION: General:  Well developed and well nourished in no acute distress Neuro: Awake and alert, non focal HEENT:  Tenderness on palpation of right maxillary  sinus. Thrush. Cardiovascular:  Regular rate and rhythm Lungs:  No use of accessory muscles. Clear without rales, wheezing, and rhonchi.  Abdomen: Soft.  Diffusely tender.no guarding or rigdity Musculoskeletal:  No deformities Skin:  Warm and dry  LABS:  Recent Labs Lab 05/25/12 0250  HGB 13.3  WBC 8.6  PLT 204  NA 137  K 4.6  CL 97  CO2 26  GLUCOSE 108*  BUN 29*  CREATININE 1.50*  CALCIUM 9.1  AST 14  ALT 11  ALKPHOS 52  BILITOT 0.4  PROT 8.1  ALBUMIN 4.0   No results found for this basename: GLUCAP,  in the last 168 hours  Ct Abdomen Pelvis W Contrast  05/25/2012  *RADIOLOGY REPORT*  Clinical Data: Mid abdominal pain, nausea, vomiting and loose stools.  CT ABDOMEN AND PELVIS WITH CONTRAST  Technique:  Multidetector CT imaging of the  abdomen and pelvis was performed following the standard protocol during bolus administration of intravenous contrast.  Contrast: 80mL OMNIPAQUE IOHEXOL 300 MG/ML  SOLN  Comparison: PET/CT performed 06/27/2008, and MRI of the lumbar spine performed 10/24/2010; abdominal ultrasound performed 03/09/2007  Findings: The visualized lung bases are clear.  There is mild nonspecific prominence of the intrahepatic biliary ducts.  The liver is otherwise unremarkable in appearance.  The spleen is within normal limits.  Scattered small stones are seen layering dependently in the gallbladder; the gallbladder is otherwise unremarkable in appearance.  The pancreas and adrenal glands are unremarkable.  Small bilateral renal stones are seen, measuring up to 5 mm in size.  Tiny hypodensities within both kidneys likely reflect small cysts.  The kidneys are otherwise unremarkable in appearance. Minimal nonspecific perinephric stranding is noted bilaterally. There is no evidence of hydronephrosis.  No obstructing ureteral stones are seen.  No free fluid is identified.  The small bowel is unremarkable in appearance.  A tiny hiatal hernia is noted; the stomach is otherwise unremarkable.  No acute vascular abnormalities are seen. Relatively diffuse calcification is noted along the abdominal aorta and its branches.  The appendix is dilated to 1.3 cm in maximal diameter, with surrounding soft tissue inflammation and trace fluid.  Trace associated fluid is noted within the pelvis.  This is compatible with acute appendicitis.  There is no definite evidence for perforation or abscess formation at this time.  Scattered diverticulosis is noted about the entirety of the colon, more prominent along the distal ascending and transverse colon. The colon is otherwise grossly unremarkable in appearance.  The bladder is moderately distended and grossly unremarkable.  The patient is status post hysterectomy.  No suspicious adnexal masses are seen.  No  inguinal lymphadenopathy is seen.  No acute osseous abnormalities are identified.  Vacuum phenomenon is noted at L4-L5.  IMPRESSION:  1.  Acute appendicitis, with dilatation of the appendix to 1.3 cm in diameter and surrounding soft tissue inflammation.  Trace fluid noted about the appendix, and within the pelvis.  No evidence for perforation or abscess formation at this time. 2.  Scattered diverticulosis noted about the entirety of the colon, more prominent along the distal ascending and transverse colon. 3.  Small bilateral renal stones, measuring up to 5 mm in size. Tiny bilateral renal cysts seen. 4.  Tiny hiatal hernia seen. 5.  Relatively diffuse calcification along the abdominal aorta and its branches. 6.  Cholelithiasis again noted; gallbladder otherwise unremarkable in appearance.  These results were called by telephone on 05/25/2012 at 05:39 a.m. to [facility] PA, who verbally acknowledged these  results.   Original Report Authenticated By: Tonia Ghent, M.D.    Dg Chest Portable 1 View  05/25/2012  *RADIOLOGY REPORT*  Clinical Data: History of COPD with abdominal pain and emesis.  PORTABLE CHEST - 1 VIEW  Comparison: 07/20/2011  Findings: Again noted are postoperative changes in left hemithorax. There are slightly low lung volumes.  No focal airspace disease or edema.  Heart size is upper limits normal but unchanged. Postoperative changes in the cervical spine.  IMPRESSION: Slightly low lung volumes with stable postoperative changes.  No acute findings.   Original Report Authenticated By: Richarda Overlie, M.D.     ASSESSMENT / PLAN:  PULMONARY A: COPD S/P LUL Lobectomy 5/10 for stage 1 a squamous cell CA  P:   -cleared for surgery with due risk -need albuterol and atrovent nebs q6 hrs perioperatively -atelectasis and pneumonias are risks therefore initiate incentive spirometry postop -oob & DVT prophylaxis post op as is routine   GASTROINTESTINAL A: Presumed Acute Appendicitis P:    -Managed by hospitalists    TODAY'S SUMMARY: Cleared for appendectomy from pulm standpoint .  Pl call post op if new issues , we will be available as needed   I have personally obtained a history, examined the patient, evaluated laboratory and imaging results, formulated the assessment and plan and placed orders.  Oretha Milch   Pulmonary and Critical Care Medicine [facility] Pager: [phone]  05/25/2012, 8:33 AM

## 2012-05-25 NOTE — Progress Notes (Signed)
Wylene Men, RN in [place] made aware no order for consent for surgery in EPIC. Dr. Abbey Chatters already in to see pt this am. Dr. Abbey Chatters currently in surgical area. Order obtained.

## 2012-05-25 NOTE — Preoperative (Signed)
Beta Blockers   Reason not to administer Beta Blockers:Hold beta blocker due to hypotension, Hold beta blocker due to hypovolemia 

## 2012-05-25 NOTE — ED Notes (Signed)
Patient states that she started having abdominal pain yesterday that got worse today. Reports she also has been vomiting and passing soft stool.

## 2012-05-25 NOTE — Progress Notes (Signed)
Patient ID: [name], female   DOB: 02-07-1941, 72 y.o.   MRN: [id number]   CARDIOLOGY CONSULT NOTE  Patient ID: [name] MRN: [id number], DOB/AGE: 31-Aug-1940   Admit date: 05/25/2012 Date of Consult: 05/25/2012   Primary Physician: [name], MD Primary Cardiologist: Doylene Bode  Pt. Profile 72 year old black female with acute appendicitis, consulted for preoperative clearance. Patient are to cleared by critical care and pulmonary.   Problem List  Past Medical History  Diagnosis Date  . HYPERLIPIDEMIA 11/18/2006  . HYPERKALEMIA 02/05/2008  . ANEMIA-IRON DEFICIENCY 09/22/2008  . Anemia of other chronic disease 02/05/2008  . ANXIETY 06/03/2008  . DEPRESSION 04/28/2009  . HYPERTENSION 11/18/2006  . PERICARDITIS 01/03/2007  . AORTIC STENOSIS/ INSUFFICIENCY, NON-RHEUMATIC 12/03/2008  . SINUSITIS- ACUTE-NOS 11/20/2007  . SINUSITIS, CHRONIC 02/05/2008  . ALLERGIC RHINITIS 11/18/2006  . PNEUMONIA 05/02/2007  . C O P D 06/27/2008  . Stricture and stenosis of esophagus 11/14/2008  . GERD 01/03/2007  . BARRETTS ESOPHAGUS 11/14/2008  . RENAL INSUFFICIENCY 02/05/2008  . DYSPEPSIA 03/23/2007  . PRURITUS 10/08/2008  . OSTEOARTHRITIS 11/18/2006  . OSTEOARTHRITIS, KNEE, LEFT 01/03/2007  . Cervicalgia 01/13/2009  . BACK PAIN 02/04/2009  . BURSITIS, LEFT HIP 04/18/2009  . OSTEOPOROSIS 11/18/2006  . INSOMNIA-SLEEP DISORDER-UNSPEC 06/03/2008  . FATIGUE 04/18/2009  . PERIPHERAL EDEMA 02/13/2009  . MURMUR 11/18/2006  . DYSPHAGIA UNSPECIFIED 03/23/2007  . Abdominal pain, generalized 03/01/2007  . Nonspecific (abnormal) findings on radiological and other examination of body structure 06/03/2008  . Tuberculin Test Reaction 11/18/2006  . Acute bronchitis 02/23/2010  . CHOLELITHIASIS 04/24/2010  . NEPHROLITHIASIS, HX OF 04/24/2010  . Polyarthralgia 06/09/2010  . Elevated sed rate 06/11/2010  . Positive ANA (antinuclear antibody) 06/11/2010  . Rheumatoid factor positive 06/11/2010  . Cancer     lung ca  .  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 SPONDYLOSIS, CERVICAL, WITH RADICULOPATHY 01/13/2009  . Cervical radiculopathy 09/28/2010  . Lumbar radiculopathy 09/28/2010    Past Surgical History  Procedure Laterality Date  . Abdominal hysterectomy    . Tubal ligation    . Tumor removal  08/06/08    from lungs  . Anterior cervical decomp/discectomy fusion  01/15/2011    Procedure: ANTERIOR CERVICAL DECOMPRESSION/DISCECTOMY FUSION 2 LEVELS;  Surgeon: Kathaleen Maser Pool;  Location: [facility];  Service: Neurosurgery;  Laterality: N/A;  Cervical Three-Four, Cervical Four-Five Anterior Cervical Decompression Fusion   . Colonscopy    . Nose surgery       Allergies  Allergies  Allergen Reactions  . Amoxicillin Itching  . Fentanyl Other (See Comments)    hallucinations  . Hydrocodone Other (See Comments)    ineffective  . Codeine Hives and Rash  . Penicillins Hives and Rash    HPI  History outlined by other evaluations. Briefly, 5-6 day history of abdominal pain, nausea, vomiting,. CT scan of the abdomen shows acute appendicitis. Her white blood cell count is normal at 8.6.  Her only cardiac history is remote pericarditis not requiring any surgical intervention. She has a history of nonrheumatic mild aortic stenosis and mild aortic insufficiency and a history of some mild diastolic dysfunction, last EF 78% on echocardiography December 2013. She also has mild to moderate mitral regurgitation, moderate left atrial enlargement, and mild pulmonary hypertension. There is no history of coronary artery disease. There is no history of clinical congestive failure.  She denies any history of chest pain or increased dyspnea on exertion. Her lead cardiogram shows normal sinus rhythm with lateral ST  changes.  His multiple other comorbidities outlined in the past medical history and by pulmonary critical care which are more of an issue for surgical clearance.  Inpatient Medications  . ipratropium  0.5 mg  Nebulization QID   And  . albuterol  2.5 mg Nebulization QID  . [START ON 05/26/2012] ertapenem  1 g Intravenous Q24H  . sodium chloride  3 mL Intravenous Q12H    Family History Family History  Problem Relation Age of Onset  . Hyperlipidemia Other   . Hypertension Other   . Coronary artery disease Other     several nephrew  . Heart disease Father   . Colon cancer Neg Hx   . Esophageal cancer Neg Hx   . Rectal cancer Neg Hx   . Stomach cancer Neg Hx      Social History History   Social History  . Marital Status: Widowed    Spouse Name: N/A    Number of Children: N/A  . Years of Education: N/A   Occupational History  . Not on file.   Social History Main Topics  . Smoking status: Former Smoker -- 1.50 packs/day for 40 years    Types: Cigarettes    Quit date: 02/15/2006  . Smokeless tobacco: Former Neurosurgeon  . Alcohol Use: No  . Drug Use: No  . Sexually Active: Yes    Birth Control/ Protection: Post-menopausal   Other Topics Concern  . Not on file   Social History Narrative  . No narrative on file     Review of Systems  General:  No chills, fever, night sweats or weight changes.  Cardiovascular:  No chest pain, dyspnea on exertion, edema, orthopnea, palpitations, paroxysmal nocturnal dyspnea. Dermatological: No rash, lesions/masses Respiratory: No cough, dyspnea Urologic: No hematuria, dysuria Abdominal:   no bright red blood per rectum, melena, or hematemesis Neurologic:  No visual changes, wkns, changes in mental status. All other systems reviewed and are otherwise negative except as noted above.  Physical Exam  Blood pressure 155/54, pulse 79, temperature 98.2 F (36.8 C), temperature source Oral, resp. rate 18, SpO2 99.00%.  General: Pleasant, NAD,  Psych: Normal affect. Neuro: Alert and oriented X 3. Moves all extremities spontaneously. HEENT: Normal, edentulous, arcus senilis  Neck: Supple without bruits or JVD. Lungs:  Resp regular and unlabored,  CTA. Heart: RRR no s3, s4, soft systolic murmur at the apex and left lower sternal border Abdomen: Soft, non-tender, non-distended, BS + x 4.  Extremities: No clubbing, cyanosis or edema. DP/PT/Radials 2+ and equal bilaterally.  Labs  No results found for this basename: CKTOTAL, CKMB, TROPONINI,  in the last 72 hours Lab Results  Component Value Date   WBC 8.6 05/25/2012   HGB 13.3 05/25/2012   HCT 40.2 05/25/2012   MCV 99.0 05/25/2012   PLT 204 05/25/2012    Recent Labs Lab 05/25/12 0250  NA 137  K 4.6  CL 97  CO2 26  BUN 29*  CREATININE 1.50*  CALCIUM 9.1  PROT 8.1  BILITOT 0.4  ALKPHOS 52  ALT 11  AST 14  GLUCOSE 108*   Lab Results  Component Value Date   CHOL 141 04/11/2012   HDL 46.50 04/11/2012   LDLCALC 73 04/11/2012   TRIG 108.0 04/11/2012   Lab Results  Component Value Date   DDIMER 0.37 10/27/2010    Radiology/Studies  Ct Abdomen Pelvis W Contrast  05/25/2012  *RADIOLOGY REPORT*  Clinical Data: Mid abdominal pain, nausea, vomiting and loose stools.  CT  ABDOMEN AND PELVIS WITH CONTRAST  Technique:  Multidetector CT imaging of the abdomen and pelvis was performed following the standard protocol during bolus administration of intravenous contrast.  Contrast: 80mL OMNIPAQUE IOHEXOL 300 MG/ML  SOLN  Comparison: PET/CT performed 06/27/2008, and MRI of the lumbar spine performed 10/24/2010; abdominal ultrasound performed 03/09/2007  Findings: The visualized lung bases are clear.  There is mild nonspecific prominence of the intrahepatic biliary ducts.  The liver is otherwise unremarkable in appearance.  The spleen is within normal limits.  Scattered small stones are seen layering dependently in the gallbladder; the gallbladder is otherwise unremarkable in appearance.  The pancreas and adrenal glands are unremarkable.  Small bilateral renal stones are seen, measuring up to 5 mm in size.  Tiny hypodensities within both kidneys likely reflect small cysts.  The kidneys are  otherwise unremarkable in appearance. Minimal nonspecific perinephric stranding is noted bilaterally. There is no evidence of hydronephrosis.  No obstructing ureteral stones are seen.  No free fluid is identified.  The small bowel is unremarkable in appearance.  A tiny hiatal hernia is noted; the stomach is otherwise unremarkable.  No acute vascular abnormalities are seen. Relatively diffuse calcification is noted along the abdominal aorta and its branches.  The appendix is dilated to 1.3 cm in maximal diameter, with surrounding soft tissue inflammation and trace fluid.  Trace associated fluid is noted within the pelvis.  This is compatible with acute appendicitis.  There is no definite evidence for perforation or abscess formation at this time.  Scattered diverticulosis is noted about the entirety of the colon, more prominent along the distal ascending and transverse colon. The colon is otherwise grossly unremarkable in appearance.  The bladder is moderately distended and grossly unremarkable.  The patient is status post hysterectomy.  No suspicious adnexal masses are seen.  No inguinal lymphadenopathy is seen.  No acute osseous abnormalities are identified.  Vacuum phenomenon is noted at L4-L5.  IMPRESSION:  1.  Acute appendicitis, with dilatation of the appendix to 1.3 cm in diameter and surrounding soft tissue inflammation.  Trace fluid noted about the appendix, and within the pelvis.  No evidence for perforation or abscess formation at this time. 2.  Scattered diverticulosis noted about the entirety of the colon, more prominent along the distal ascending and transverse colon. 3.  Small bilateral renal stones, measuring up to 5 mm in size. Tiny bilateral renal cysts seen. 4.  Tiny hiatal hernia seen. 5.  Relatively diffuse calcification along the abdominal aorta and its branches. 6.  Cholelithiasis again noted; gallbladder otherwise unremarkable in appearance.  These results were called by telephone on  05/25/2012 at 05:39 a.m. to [facility] PA, who verbally acknowledged these results.   Original Report Authenticated By: Tonia Ghent, M.D.    Dg Chest Portable 1 View  05/25/2012  *RADIOLOGY REPORT*  Clinical Data: History of COPD with abdominal pain and emesis.  PORTABLE CHEST - 1 VIEW  Comparison: 07/20/2011  Findings: Again noted are postoperative changes in left hemithorax. There are slightly low lung volumes.  No focal airspace disease or edema.  Heart size is upper limits normal but unchanged. Postoperative changes in the cervical spine.  IMPRESSION: Slightly low lung volumes with stable postoperative changes.  No acute findings.   Original Report Authenticated By: Richarda Overlie, M.D.     ECG Normal sinus rhythm, ST segment changes in 1 aVL  ASSESSMENT AND PLAN  She is low operative risk from a cardiac standpoint. Her only issue is mild  aortic stenosis and mild aortic insufficiency as well as some moderate mitral regurgitation. She has normal left ventricular systolic function.  I would recommend postoperative hemodynamic monitoring and would be liberal with fluids.  Her main operative risk is her other comorbidities including her lung disease. However, she should get through surgery without any significant complications.  We will follow as needed.   Signed, Valera Castle, MD 05/25/2012, 9:55 AM

## 2012-05-25 NOTE — ED Notes (Signed)
Pt c/o mid abd pain "all week" pt states she has had loose stools for same amount of time. Pt does say this is not diarrhea but loose. Pt states she vomited x 2 PTA. Pt denies urinary s/s. Pt denies blood in urine or stool.

## 2012-05-25 NOTE — Anesthesia Postprocedure Evaluation (Signed)
  Anesthesia Post-op Note  Patient: [name]  Procedure(s) Performed: Procedure(s) (LRB): APPENDECTOMY LAPAROSCOPIC  CONVERTED TO  OPEN APPENDECTOMY, exploratory laparatomy (N/A) SMALL BOWEL RESECTION (N/A) LAPAROSCOPIC LYSIS OF ADHESIONS (N/A)  Patient Location: PACU  Anesthesia Type: General  Level of Consciousness: awake and alert   Airway and Oxygen Therapy: Patient Spontanous Breathing  Post-op Pain: mild  Post-op Assessment: Post-op Vital signs reviewed, Patient's Cardiovascular Status Stable, Respiratory Function Stable, Patent Airway and No signs of Nausea or vomiting  Last Vitals:  Filed Vitals:   05/25/12 1645  BP: 151/42  Pulse: 71  Temp: 37.6 C  Resp: 16    Post-op Vital Signs: stable   Complications: No apparent anesthesia complications

## 2012-05-25 NOTE — H&P (Addendum)
[facility] History and Physical  [name]:[id number] DOB: 07/22/1940 DOA: 05/25/2012  Referring physician: Dr. Rulon Abide PCP: [name], MD  Specialists: Pulmonary, Cardiology, Surgery  Chief Complaint: abdominal pain.   HPI: [name] is a 72 y.o. 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 She describes abdominal pain as initially epigastric, now going to the right upper and right lower quadrants. She denies any fever, chills. She had few episodes of vomiting. She denies any frank diarrhea, however endorses an increasing frequency of stools. She initiated she repeated her abdominal pain to a food that she ate, however nobody also the family got sick. The abdominal pain got to the point where it was so severe and unrelenting that she decided to come to the emergency room. In the ED showed a CAT scan of the abdomen which showed acute appendicitis. Surgery has been consulted for appendectomy. Hospitalist service was asked for admission given multiple comorbidities.  Review of Systems: As per history of present illness, otherwise negative  Past Medical History  Diagnosis Date  . HYPERLIPIDEMIA 11/18/2006  . HYPERKALEMIA 02/05/2008  . ANEMIA-IRON DEFICIENCY 09/22/2008  . Anemia of other chronic disease 02/05/2008  . ANXIETY 06/03/2008  . DEPRESSION 04/28/2009  . HYPERTENSION 11/18/2006  . PERICARDITIS 01/03/2007  . AORTIC STENOSIS/ INSUFFICIENCY, NON-RHEUMATIC 12/03/2008  . SINUSITIS- ACUTE-NOS 11/20/2007  . SINUSITIS, CHRONIC 02/05/2008  . ALLERGIC RHINITIS 11/18/2006  . PNEUMONIA 05/02/2007  . C O P D 06/27/2008  . Stricture and stenosis of esophagus  11/14/2008  . GERD 01/03/2007  . BARRETTS ESOPHAGUS 11/14/2008  . RENAL INSUFFICIENCY 02/05/2008  . DYSPEPSIA 03/23/2007  . PRURITUS 10/08/2008  . OSTEOARTHRITIS 11/18/2006  . OSTEOARTHRITIS, KNEE, LEFT 01/03/2007  . Cervicalgia 01/13/2009  . BACK PAIN 02/04/2009  . BURSITIS, LEFT HIP 04/18/2009  . OSTEOPOROSIS 11/18/2006  . INSOMNIA-SLEEP DISORDER-UNSPEC 06/03/2008  . FATIGUE 04/18/2009  . PERIPHERAL EDEMA 02/13/2009  . MURMUR 11/18/2006  . DYSPHAGIA UNSPECIFIED 03/23/2007  . Abdominal pain, generalized 03/01/2007  . Nonspecific (abnormal) findings on radiological and other examination of body structure 06/03/2008  . Tuberculin Test Reaction 11/18/2006  . Acute bronchitis 02/23/2010  . CHOLELITHIASIS 04/24/2010  . NEPHROLITHIASIS, HX OF 04/24/2010  . Polyarthralgia 06/09/2010  . Elevated sed rate 06/11/2010  . Positive ANA (antinuclear antibody) 06/11/2010  . Rheumatoid factor positive 06/11/2010  . Cancer     lung ca  . 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 SPONDYLOSIS, CERVICAL, WITH RADICULOPATHY 01/13/2009  . Cervical radiculopathy 09/28/2010  . Lumbar radiculopathy 09/28/2010   Past Surgical History  Procedure Laterality Date  . Abdominal hysterectomy    . Tubal ligation    . Tumor removal  08/06/08    from lungs  . Anterior cervical decomp/discectomy fusion  01/15/2011    Procedure: ANTERIOR CERVICAL DECOMPRESSION/DISCECTOMY FUSION 2 LEVELS;  Surgeon: Kathaleen Maser Pool;  Location: [facility];  Service: Neurosurgery;  Laterality: N/A;  Cervical Three-Four, Cervical Four-Five Anterior Cervical Decompression Fusion   . Colonscopy    . Nose surgery     Social History:  reports that she quit smoking about 6 years ago. Her smoking use included Cigarettes. She has a 60 pack-year smoking history. She has quit  using smokeless tobacco. She reports that she does not drink alcohol or use illicit drugs.  Allergies  Allergen Reactions  . Amoxicillin Itching  . Fentanyl Other (See Comments)     hallucinations  . Hydrocodone Other (See Comments)    ineffective  . Codeine Hives and Rash  . Penicillins Hives and Rash    Family History  Problem Relation Age of Onset  . Hyperlipidemia Other   . Hypertension Other   . Coronary artery disease Other     several nephrew  . Heart disease Father   . Colon cancer Neg Hx   . Esophageal cancer Neg Hx   . Rectal cancer Neg Hx   . Stomach cancer Neg Hx     Prior to Admission medications   Medication Sig Start Date End Date Taking? Authorizing Provider  ALPRAZolam Prudy Feeler) 0.5 MG tablet Take 0.5 mg by mouth daily as needed for anxiety.   Yes Historical Provider, MD  amLODipine (NORVASC) 5 MG tablet Take 1 tablet (5 mg total) by mouth daily. 09/24/11 09/23/12 Yes Corwin Levins, MD  Cholecalciferol (VITAMIN D3) 1000 UNITS CAPS Take 1 capsule by mouth daily.    Yes Historical Provider, MD  citalopram (CELEXA) 10 MG tablet Take 1 tablet (10 mg total) by mouth daily. 09/24/11 09/23/12 Yes Corwin Levins, MD  furosemide (LASIX) 40 MG tablet Take 40 mg by mouth 2 (two) times daily.   Yes Historical Provider, MD  HYDROcodone-acetaminophen (NORCO) 10-325 MG per tablet TAKE 1 TABLET BY MOUTH EVERY 6 HOURS AS NEEDED FOR PAIN 05/18/12  Yes Corwin Levins, MD  metoprolol succinate (TOPROL-XL) 25 MG 24 hr tablet Take 1 tablet (25 mg total) by mouth daily. 01/25/12 01/24/13 Yes Kathleene Hazel, MD  pantoprazole (PROTONIX) 40 MG tablet TAKE 1 TABLET (40 MG TOTAL) BY MOUTH DAILY. 05/17/12  Yes Corwin Levins, MD  pravastatin (PRAVACHOL) 40 MG tablet Take 1 tablet (40 mg total) by mouth daily. 12/02/11  Yes Corwin Levins, MD  zolpidem (AMBIEN) 5 MG tablet Take 1 tablet (5 mg total) by mouth at bedtime as needed. For sleep. 04/25/12 04/25/13 Yes Corwin Levins, MD  albuterol (PROVENTIL HFA;VENTOLIN HFA) 108 (90 BASE) MCG/ACT inhaler Inhale 1-2 puffs into the lungs 4 (four) times daily as needed. For wheezing or shortness of breath. 01/11/11   Tammy S Parrett, NP  alendronate  (FOSAMAX) 70 MG tablet Take 1 tablet (70 mg total) by mouth every 7 (seven) days. Take with a full glass of water on an empty stomach. Patient takes on Mondays. 03/28/12   Corwin Levins, MD   Physical Exam: Filed Vitals:   05/25/12 1610 05/25/12 0556 05/25/12 0719  BP: 148/56 144/54 155/54  Pulse: 62 74 79  Temp: 98.6 F (37 C) 98.2 F (36.8 C)   TempSrc: Oral Oral   Resp: 20 18 18   SpO2: 93% 95% 99%     General:  NAD, frail appearing African American woman  Eyes: PERRL, EOMI  ENT: moist oropharynx  Neck: supple, no JVD  Cardiovascular: RRR, 3/6 SEM   Respiratory: CTA biL, good air movement without wheezing, rhonchi or crackled  Abdomen: Diffuse tenderness to palpation especially in the right lower quadrant. Present rebound.  Skin: no rashes  Musculoskeletal: no peripheral edema  Psychiatric: normal mood and affect  Neurologic: CN 2-12 grossly intact, MS 5/5 in all 4  Labs on Admission:  Basic Metabolic Panel:  Recent Labs Lab 05/25/12 0250  NA 137  K 4.6  CL 97  CO2 26  GLUCOSE 108*  BUN 29*  CREATININE 1.50*  CALCIUM 9.1   Liver Function Tests:  Recent Labs Lab 05/25/12 0250  AST 14  ALT 11  ALKPHOS 52  BILITOT 0.4  PROT 8.1  ALBUMIN 4.0    Recent Labs Lab 05/25/12 0250  LIPASE 27   CBC:  Recent Labs Lab 05/25/12 0250  WBC 8.6  NEUTROABS 6.8  HGB 13.3  HCT 40.2  MCV 99.0  PLT 204    Radiological Exams on Admission: Ct Abdomen Pelvis W Contrast  05/25/2012  *RADIOLOGY REPORT*  Clinical Data: Mid abdominal pain, nausea, vomiting and loose stools.  CT ABDOMEN AND PELVIS WITH CONTRAST  Technique:  Multidetector CT imaging of the abdomen and pelvis was performed following the standard protocol during bolus administration of intravenous contrast.  Contrast: 80mL OMNIPAQUE IOHEXOL 300 MG/ML  SOLN  Comparison: PET/CT performed 06/27/2008, and MRI of the lumbar spine performed 10/24/2010; abdominal ultrasound performed 03/09/2007  Findings:  The visualized lung bases are clear.  There is mild nonspecific prominence of the intrahepatic biliary ducts.  The liver is otherwise unremarkable in appearance.  The spleen is within normal limits.  Scattered small stones are seen layering dependently in the gallbladder; the gallbladder is otherwise unremarkable in appearance.  The pancreas and adrenal glands are unremarkable.  Small bilateral renal stones are seen, measuring up to 5 mm in size.  Tiny hypodensities within both kidneys likely reflect small cysts.  The kidneys are otherwise unremarkable in appearance. Minimal nonspecific perinephric stranding is noted bilaterally. There is no evidence of hydronephrosis.  No obstructing ureteral stones are seen.  No free fluid is identified.  The small bowel is unremarkable in appearance.  A tiny hiatal hernia is noted; the stomach is otherwise unremarkable.  No acute vascular abnormalities are seen. Relatively diffuse calcification is noted along the abdominal aorta and its branches.  The appendix is dilated to 1.3 cm in maximal diameter, with surrounding soft tissue inflammation and trace fluid.  Trace associated fluid is noted within the pelvis.  This is compatible with acute appendicitis.  There is no definite evidence for perforation or abscess formation at this time.  Scattered diverticulosis is noted about the entirety of the colon, more prominent along the distal ascending and transverse colon. The colon is otherwise grossly unremarkable in appearance.  The bladder is moderately distended and grossly unremarkable.  The patient is status post hysterectomy.  No suspicious adnexal masses are seen.  No inguinal lymphadenopathy is seen.  No acute osseous abnormalities are identified.  Vacuum phenomenon is noted at L4-L5.  IMPRESSION:  1.  Acute appendicitis, with dilatation of the appendix to 1.3 cm in diameter and surrounding soft tissue inflammation.  Trace fluid noted about the appendix, and within the pelvis.   No evidence for perforation or abscess formation at this time. 2.  Scattered diverticulosis noted about the entirety of the colon, more prominent along the distal ascending and transverse colon. 3.  Small bilateral renal stones, measuring up to 5 mm in size. Tiny bilateral renal cysts seen. 4.  Tiny hiatal hernia seen. 5.  Relatively diffuse calcification along the abdominal aorta and its branches. 6.  Cholelithiasis again noted; gallbladder otherwise unremarkable in appearance.  These results were called by telephone on 05/25/2012 at 05:39 a.m. to [facility] PA, who verbally acknowledged these results.   Original Report Authenticated By: Tonia Ghent, M.D.    Dg Chest Portable 1 View  05/25/2012  *RADIOLOGY REPORT*  Clinical Data: History of COPD with abdominal pain and emesis.  PORTABLE CHEST - 1 VIEW  Comparison: 07/20/2011  Findings: Again noted are postoperative changes in left hemithorax. There are slightly low lung volumes.  No focal airspace disease or edema.  Heart size is upper limits normal but unchanged. Postoperative changes in the cervical spine.  IMPRESSION: Slightly low lung volumes with stable postoperative changes.  No acute findings.   Original Report Authenticated By: Richarda Overlie, M.D.     EKG: Independently reviewed.  Assessment/Plan Active Problems:   HYPERLIPIDEMIA   AORTIC STENOSIS/ INSUFFICIENCY, NON-RHEUMATIC   C O P D   Stricture and stenosis of esophagus   Heart palpitations   CKD (chronic kidney disease)   Lung cancer   Acute appendicitis - Appreciate surgery input, she will likely need surgery today - agree with Invanz meanwhile  COPD - Pulmonology has been consulted for preoperative risk assessment; appreciate their input - her COPD is stable  Grade 1 diastolic dysfunction/mild AS/AI - She is being followed by cardiology. Cardiology has been consulted for perioperative risk assessment. - there is no evidence of decompensation now.   History of lung  cancer - Followed by Dr. Eugenia Mcalpine, last saw him in August of 2013, feels like there is no recurrence  DVT Prophylaxis - pending surgery, holding heparin  Code Status: DNR/DNI  Family Communication: None  Disposition Plan: Inpatient  Time spent: 56  Fronnie Urton M. Elvera Lennox, MD [facility] Pager [phone]  If 7PM-7AM, please contact night-coverage [email or web address] Password [facility] 05/25/2012, 8:43 AM

## 2012-05-25 NOTE — Brief Op Note (Signed)
05/25/2012  3:43 PM  PATIENT:  [name]  72 y.o. female  PRE-OPERATIVE DIAGNOSIS:  appendicitis  POST-OPERATIVE DIAGNOSIS:  perforated appendicitis  PROCEDURE:  Procedure(s): APPENDECTOMY LAPAROSCOPIC  CONVERTED TO  OPEN APPENDECTOMY (N/A)  SURGEON:  Surgeon(s) and Role:    * Adolph Pollack, MD - Primary  PHYSICIAN ASSISTANT:   ASSISTANTS: Dr. Karie Soda   ANESTHESIA:   general  EBL:  Total I/O In: 2000 [I.V.:2000] Out: 1200 [Urine:300; Blood:900]  BLOOD ADMINISTERED:none  DRAINS: (One) Jackson-Pratt drain(s) with closed bulb suction in the Right gutter and pelvis   LOCAL MEDICATIONS USED:  MARCAINE     SPECIMEN:  Source of Specimen:  appendix  DISPOSITION OF SPECIMEN:  PATHOLOGY  COUNTS:  YES  TOURNIQUET:  * No tourniquets in log *  DICTATION: .Dragon Dictation  PLAN OF CARE: Admit to inpatient   PATIENT DISPOSITION:  ICU - intubated and critically ill.   Delay start of Pharmacological VTE agent (>24hrs) due to surgical blood loss or risk of bleeding: yes

## 2012-05-25 NOTE — Anesthesia Preprocedure Evaluation (Addendum)
Anesthesia Evaluation  Patient identified by MRN, date of birth, ID band Patient awake    Reviewed: Allergy & Precautions, H&P , NPO status , Patient's Chart, lab work & pertinent test results  Airway       Dental   Pulmonary pneumonia -, COPD         Cardiovascular Exercise Tolerance: Poor hypertension, Pt. on medications + dysrhythmias + Valvular Problems/Murmurs AS and AI  Cleared by Dr. Daleen Squibb and Dr. Vassie Loll   Neuro/Psych PSYCHIATRIC DISORDERS Anxiety Depression  Neuromuscular disease    GI/Hepatic Neg liver ROS, GERD-  ,  Endo/Other  negative endocrine ROS  Renal/GU Renal InsufficiencyRenal diseaseChronic kidney disease.  negative genitourinary   Musculoskeletal negative musculoskeletal ROS (+)   Abdominal (+) + obese,   Peds negative pediatric ROS (+)  Hematology negative hematology ROS (+)   Anesthesia Other Findings   Reproductive/Obstetrics negative OB ROS                         Anesthesia Physical Anesthesia Plan  ASA: III and emergent  Anesthesia Plan: General   Post-op Pain Management:    Induction: Intravenous  Airway Management Planned: Oral ETT  Additional Equipment:   Intra-op Plan:   Post-operative Plan: Extubation in OR  Informed Consent: I have reviewed the patients History and Physical, chart, labs and discussed the procedure including the risks, benefits and alternatives for the proposed anesthesia with the patient or authorized representative who has indicated his/her understanding and acceptance.   Dental advisory given  Plan Discussed with: CRNA  Anesthesia Plan Comments: (Seen and cleared by cardiology and pulmonology. Plan is for transfer to stepdown unit postop.)       Anesthesia Quick Evaluation

## 2012-05-26 ENCOUNTER — Encounter (HOSPITAL_COMMUNITY): Payer: Self-pay | Admitting: General Surgery

## 2012-05-26 DIAGNOSIS — K352 Acute appendicitis with generalized peritonitis, without abscess: Principal | ICD-10-CM | POA: Diagnosis present

## 2012-05-26 DIAGNOSIS — K3580 Unspecified acute appendicitis: Secondary | ICD-10-CM

## 2012-05-26 LAB — BASIC METABOLIC PANEL
BUN: 16 mg/dL (ref 6–23)
Creatinine, Ser: 1.11 mg/dL — ABNORMAL HIGH (ref 0.50–1.10)
GFR calc non Af Amer: 49 mL/min — ABNORMAL LOW (ref >90)
Glucose, Bld: 147 mg/dL — ABNORMAL HIGH (ref 70–99)
Potassium: 4.4 mEq/L (ref 3.5–5.1)

## 2012-05-26 LAB — CBC
HCT: 30.6 % — ABNORMAL LOW (ref 36.0–46.0)
Hemoglobin: 10.2 g/dL — ABNORMAL LOW (ref 12.0–15.0)
MCHC: 33.3 g/dL (ref 30.0–36.0)
MCV: 99 fL (ref 78.0–100.0)

## 2012-05-26 MED ORDER — LEVALBUTEROL HCL 0.63 MG/3ML IN NEBU
0.6300 mg | INHALATION_SOLUTION | Freq: Four times a day (QID) | RESPIRATORY_TRACT | Status: DC
  Administered 2012-05-26 – 2012-05-28 (×9): 0.63 mg via RESPIRATORY_TRACT
  Filled 2012-05-26 (×10): qty 3

## 2012-05-26 MED ORDER — ALBUTEROL SULFATE (5 MG/ML) 0.5% IN NEBU
2.5000 mg | INHALATION_SOLUTION | Freq: Four times a day (QID) | RESPIRATORY_TRACT | Status: DC
  Administered 2012-05-26 (×2): 2.5 mg via RESPIRATORY_TRACT
  Filled 2012-05-26 (×2): qty 0.5

## 2012-05-26 MED ORDER — IPRATROPIUM BROMIDE 0.02 % IN SOLN
0.5000 mg | Freq: Four times a day (QID) | RESPIRATORY_TRACT | Status: DC
  Administered 2012-05-26 – 2012-05-28 (×11): 0.5 mg via RESPIRATORY_TRACT
  Filled 2012-05-26 (×11): qty 2.5

## 2012-05-26 MED ORDER — HYDRALAZINE HCL 20 MG/ML IJ SOLN: 5.0000 mg | Freq: Once | INTRAMUSCULAR | Status: DC

## 2012-05-26 MED ORDER — MENTHOL 3 MG MT LOZG
1.0000 | LOZENGE | OROMUCOSAL | Status: DC | PRN
  Administered 2012-05-26 – 2012-05-27 (×3): 3 mg via ORAL
  Filled 2012-05-26: qty 9

## 2012-05-26 MED ORDER — SODIUM CHLORIDE 0.45 % IV SOLN
INTRAVENOUS | Status: DC
  Administered 2012-05-26 – 2012-06-01 (×6): via INTRAVENOUS

## 2012-05-26 MED ORDER — METOPROLOL TARTRATE 1 MG/ML IV SOLN
5.0000 mg | INTRAVENOUS | Status: DC | PRN
  Administered 2012-05-26: 5 mg via INTRAVENOUS
  Filled 2012-05-26: qty 5

## 2012-05-26 MED ORDER — HYDRALAZINE HCL 20 MG/ML IJ SOLN
10.0000 mg | Freq: Four times a day (QID) | INTRAMUSCULAR | Status: DC
  Administered 2012-05-26 – 2012-05-27 (×4): 10 mg via INTRAVENOUS
  Filled 2012-05-26: qty 0.5
  Filled 2012-05-26: qty 1
  Filled 2012-05-26: qty 0.5
  Filled 2012-05-26: qty 1
  Filled 2012-05-26: qty 0.5
  Filled 2012-05-26 (×2): qty 1
  Filled 2012-05-26: qty 0.5

## 2012-05-26 NOTE — Progress Notes (Signed)
PULMONARY  / CRITICAL CARE MEDICINE  Name: [name] MRN: [id number] DOB: 1940/07/07    ADMISSION DATE:  05/25/2012 CONSULTATION DATE:  05/25/2012  REFERRING MD : Elvera Lennox  CHIEF COMPLAINT:  Abdominal Pain  CULTURES:  ANTIBIOTICS: 4/10- Invanz>>>  HISTORY OF PRESENT ILLNESS:  72 y/o AF presented to ED with  acute suspected appendicitis. PCCM consult for surgery clearance.  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 She has mild AS   EVENTS - 4/10 open appendectomy & small bowel resection - for peritonitis, perforated gangrenous appendix   SUBJECTIVE: denies  Dyspnea C/o abdominal pain - receiving frequent dilaudid  VITAL SIGNS: Temp:  [98.2 F (36.8 C)-102.6 F (39.2 C)] 98.9 F (37.2 C) (04/11 0900) Pulse Rate:  [71-99] 94 (04/11 0900) Resp:  [10-20] 14 (04/11 0900) BP: (137-179)/(34-76) 173/37 mmHg (04/11 0900) SpO2:  [91 %-100 %] 100 % (04/11 0900) Weight:  [74.2 kg (163 lb 9.3 oz)] 74.2 kg (163 lb 9.3 oz) (04/10 1002) HEMODYNAMICS:   VENTILATOR SETTINGS:   INTAKE / OUTPUT: Intake/Output     04/10 0701 - 04/11 0700 04/11 0701 - 04/12 0700   I.V. (mL/kg) 4800 (64.7) 250 (3.4)   NG/GT 30    IV Piggyback 50    Total Intake(mL/kg) 4880 (65.8) 250 (3.4)   Urine (mL/kg/hr) 1180 (0.7)    Emesis/NG output 150 (0.1)    Drains 160 (0.1)    Blood 900 (0.5)    Total Output 2390     Net +2490 +250          PHYSICAL EXAMINATION: General:  Well developed and well nourished in no acute distress Neuro: Awake and alert, non focal HEENT:  Tenderness on palpation of right maxillary sinus. Thrush. Cardiovascular:  Regular rate and rhythm,  Lungs:  No use of accessory muscles. Decreased without rales, wheezing, and rhonchi.  Abdomen: Soft.  Diffusely tender.no guarding or rigdity Musculoskeletal:  No deformities Skin:  Warm and  dry  LABS:  Recent Labs Lab 05/25/12 0250 05/25/12 1500 05/25/12 1518 05/26/12 0343  HGB 13.3 9.8* 12.6 10.2*  WBC 8.6 10.5  --  10.2  PLT 204 165  --  166  NA 137  --  138 135  K 4.6  --  3.7 4.4  CL 97  --   --  102  CO2 26  --   --  24  GLUCOSE 108*  --  121* 147*  BUN 29*  --   --  16  CREATININE 1.50*  --   --  1.11*  CALCIUM 9.1  --   --  8.0*  AST 14  --   --   --   ALT 11  --   --   --   ALKPHOS 52  --   --   --   BILITOT 0.4  --   --   --   PROT 8.1  --   --   --   ALBUMIN 4.0  --   --   --    No results found for this basename: GLUCAP,  in the last 168 hours  Ct Abdomen Pelvis W Contrast  05/25/2012  *RADIOLOGY REPORT*  Clinical Data: Mid abdominal pain, nausea, vomiting and loose stools.  CT ABDOMEN AND PELVIS WITH CONTRAST  Technique:  Multidetector CT imaging of the abdomen and  pelvis was performed following the standard protocol during bolus administration of intravenous contrast.  Contrast: 80mL OMNIPAQUE IOHEXOL 300 MG/ML  SOLN  Comparison: PET/CT performed 06/27/2008, and MRI of the lumbar spine performed 10/24/2010; abdominal ultrasound performed 03/09/2007  Findings: The visualized lung bases are clear.  There is mild nonspecific prominence of the intrahepatic biliary ducts.  The liver is otherwise unremarkable in appearance.  The spleen is within normal limits.  Scattered small stones are seen layering dependently in the gallbladder; the gallbladder is otherwise unremarkable in appearance.  The pancreas and adrenal glands are unremarkable.  Small bilateral renal stones are seen, measuring up to 5 mm in size.  Tiny hypodensities within both kidneys likely reflect small cysts.  The kidneys are otherwise unremarkable in appearance. Minimal nonspecific perinephric stranding is noted bilaterally. There is no evidence of hydronephrosis.  No obstructing ureteral stones are seen.  No free fluid is identified.  The small bowel is unremarkable in appearance.  A tiny hiatal  hernia is noted; the stomach is otherwise unremarkable.  No acute vascular abnormalities are seen. Relatively diffuse calcification is noted along the abdominal aorta and its branches.  The appendix is dilated to 1.3 cm in maximal diameter, with surrounding soft tissue inflammation and trace fluid.  Trace associated fluid is noted within the pelvis.  This is compatible with acute appendicitis.  There is no definite evidence for perforation or abscess formation at this time.  Scattered diverticulosis is noted about the entirety of the colon, more prominent along the distal ascending and transverse colon. The colon is otherwise grossly unremarkable in appearance.  The bladder is moderately distended and grossly unremarkable.  The patient is status post hysterectomy.  No suspicious adnexal masses are seen.  No inguinal lymphadenopathy is seen.  No acute osseous abnormalities are identified.  Vacuum phenomenon is noted at L4-L5.  IMPRESSION:  1.  Acute appendicitis, with dilatation of the appendix to 1.3 cm in diameter and surrounding soft tissue inflammation.  Trace fluid noted about the appendix, and within the pelvis.  No evidence for perforation or abscess formation at this time. 2.  Scattered diverticulosis noted about the entirety of the colon, more prominent along the distal ascending and transverse colon. 3.  Small bilateral renal stones, measuring up to 5 mm in size. Tiny bilateral renal cysts seen. 4.  Tiny hiatal hernia seen. 5.  Relatively diffuse calcification along the abdominal aorta and its branches. 6.  Cholelithiasis again noted; gallbladder otherwise unremarkable in appearance.  These results were called by telephone on 05/25/2012 at 05:39 a.m. to [facility] PA, who verbally acknowledged these results.   Original Report Authenticated By: Tonia Ghent, M.D.    Dg Chest Portable 1 View  05/25/2012  *RADIOLOGY REPORT*  Clinical Data: History of COPD with abdominal pain and emesis.  PORTABLE CHEST -  1 VIEW  Comparison: 07/20/2011  Findings: Again noted are postoperative changes in left hemithorax. There are slightly low lung volumes.  No focal airspace disease or edema.  Heart size is upper limits normal but unchanged. Postoperative changes in the cervical spine.  IMPRESSION: Slightly low lung volumes with stable postoperative changes.  No acute findings.   Original Report Authenticated By: Richarda Overlie, M.D.     ASSESSMENT / PLAN:  PULMONARY A: COPD S/P LUL Lobectomy 5/10 for stage 1 a squamous cell CA -extubated post op P:   -ct albuterol and atrovent nebs q6 hrs perioperatively - incentive spirometry postop -oob & DVT prophylaxis post op as routine -  watch for resp depression with pain meds   GASTROINTESTINAL A: perforated appendicitis, small bowel resection, peritonitis P:   -per surgery  - Ertapenem   Pain -  Dilaudid prn     TODAY'S SUMMARY:  Pain most important issue here   Oretha Milch   Pulmonary and Critical Care Medicine [facility] Pager: [phone]  05/26/2012, 9:22 AM

## 2012-05-26 NOTE — Progress Notes (Signed)
[facility] PROGRESS NOTE  [name] BJY:[id number] DOB: [birth year] DOA: 05/25/2012 PCP: [name], MD  Assessment/Plan: 1. Perforated Gangrenous appendicitis: ex lap, lysis of adhesions, appendectomy, small bowel resection 4/10 -NGT, IVF, NPO -per [facility] -start DVT prophylaxis per [facility]  2. COPD: stable, albuterol nebs PRN -incentive spirometry  3. H/o grade 1 diastolic dysfunction/mild AS/AI -stable, cut down IVF  4. HTN: -IV hydralazine while NPO  5. H/o lung CA -s/p left upper lobe lobectomy in 2010  6. CKD: -baseline creatinine 1.4-1.6 -better than baseline, monitor  DVT proph: start lovenox when ok with [facility]   Consultants:  [facility]  [facility]  [facility]  Procedures: PROCEDURE: Laparoscopy, exploratory laparotomy, lysis of adhesions, appendectomy, small bowel resection 4/10   Antibiotics:  Ertapenem  HPI/Subjective: C/o some abd soreness, asking abt removing NGT  Objective: Filed Vitals:   05/26/12 0800 05/26/12 0900 05/26/12 1052 05/26/12 1153  BP: 179/55 173/37    Pulse: 91 94  111  Temp:  98.9 F (37.2 C)  98.4 F (36.9 C)  TempSrc:  Oral  Oral  Resp: 13 14  20   Height:      Weight:      SpO2: 100% 100% 100% 100%    Intake/Output Summary (Last 24 hours) at 05/26/12 1225 Last data filed at 05/26/12 1200  Gross per 24 hour  Intake   5565 ml  Output   2275 ml  Net   3290 ml   Filed Weights   05/25/12 1002  Weight: 74.2 kg (163 lb 9.3 oz)    Exam:   General:  AAOx3  Cardiovascular: S1S2/RRR  Respiratory: decreased BS at bases  Abdomen: soft, distended, tenderness as expected, dressing, BS absent  Ext: no edema c/c  Neuro: moves all extremities   Data Reviewed: Basic Metabolic Panel:  Recent Labs Lab 05/25/12 0250 05/25/12 1518 05/26/12 0343  NA 137 138 135  K 4.6 3.7 4.4  CL 97  --  102  CO2 26  --  24  GLUCOSE 108* 121* 147*  BUN 29*  --  16  CREATININE 1.50*  --  1.11*  CALCIUM 9.1  --   8.0*   Liver Function Tests:  Recent Labs Lab 05/25/12 0250  AST 14  ALT 11  ALKPHOS 52  BILITOT 0.4  PROT 8.1  ALBUMIN 4.0    Recent Labs Lab 05/25/12 0250  LIPASE 27   No results found for this basename: AMMONIA,  in the last 168 hours CBC:  Recent Labs Lab 05/25/12 0250 05/25/12 1500 05/25/12 1518 05/26/12 0343  WBC 8.6 10.5  --  10.2  NEUTROABS 6.8  --   --   --   HGB 13.3 9.8* 12.6 10.2*  HCT 40.2 28.8* 37.0 30.6*  MCV 99.0 98.3  --  99.0  PLT 204 165  --  166   Cardiac Enzymes: No results found for this basename: CKTOTAL, CKMB, CKMBINDEX, TROPONINI,  in the last 168 hours BNP (last 3 results) No results found for this basename: PROBNP,  in the last 8760 hours CBG: No results found for this basename: GLUCAP,  in the last 168 hours  Recent Results (from the past 240 hour(s))  SURGICAL PCR SCREEN     Status: None   Collection Time    05/25/12 10:52 AM      Result Value Range Status   MRSA, PCR NEGATIVE  NEGATIVE Final   Staphylococcus aureus NEGATIVE  NEGATIVE Final   Comment:  The Xpert SA Assay (FDA     approved for NASAL specimens     in patients over 20 years of age),     is one component of     a comprehensive surveillance     program.  Test performance has     been validated by [employer] for patients greater     than or equal to 26 year old.     It is not intended     to diagnose infection nor to     guide or monitor treatment.     Studies: Ct Abdomen Pelvis W Contrast  05/25/2012  *RADIOLOGY REPORT*  Clinical Data: Mid abdominal pain, nausea, vomiting and loose stools.  CT ABDOMEN AND PELVIS WITH CONTRAST  Technique:  Multidetector CT imaging of the abdomen and pelvis was performed following the standard protocol during bolus administration of intravenous contrast.  Contrast: 80mL OMNIPAQUE IOHEXOL 300 MG/ML  SOLN  Comparison: PET/CT performed 06/27/2008, and MRI of the lumbar spine performed 10/24/2010; abdominal ultrasound  performed 03/09/2007  Findings: The visualized lung bases are clear.  There is mild nonspecific prominence of the intrahepatic biliary ducts.  The liver is otherwise unremarkable in appearance.  The spleen is within normal limits.  Scattered small stones are seen layering dependently in the gallbladder; the gallbladder is otherwise unremarkable in appearance.  The pancreas and adrenal glands are unremarkable.  Small bilateral renal stones are seen, measuring up to 5 mm in size.  Tiny hypodensities within both kidneys likely reflect small cysts.  The kidneys are otherwise unremarkable in appearance. Minimal nonspecific perinephric stranding is noted bilaterally. There is no evidence of hydronephrosis.  No obstructing ureteral stones are seen.  No free fluid is identified.  The small bowel is unremarkable in appearance.  A tiny hiatal hernia is noted; the stomach is otherwise unremarkable.  No acute vascular abnormalities are seen. Relatively diffuse calcification is noted along the abdominal aorta and its branches.  The appendix is dilated to 1.3 cm in maximal diameter, with surrounding soft tissue inflammation and trace fluid.  Trace associated fluid is noted within the pelvis.  This is compatible with acute appendicitis.  There is no definite evidence for perforation or abscess formation at this time.  Scattered diverticulosis is noted about the entirety of the colon, more prominent along the distal ascending and transverse colon. The colon is otherwise grossly unremarkable in appearance.  The bladder is moderately distended and grossly unremarkable.  The patient is status post hysterectomy.  No suspicious adnexal masses are seen.  No inguinal lymphadenopathy is seen.  No acute osseous abnormalities are identified.  Vacuum phenomenon is noted at L4-L5.  IMPRESSION:  1.  Acute appendicitis, with dilatation of the appendix to 1.3 cm in diameter and surrounding soft tissue inflammation.  Trace fluid noted about the  appendix, and within the pelvis.  No evidence for perforation or abscess formation at this time. 2.  Scattered diverticulosis noted about the entirety of the colon, more prominent along the distal ascending and transverse colon. 3.  Small bilateral renal stones, measuring up to 5 mm in size. Tiny bilateral renal cysts seen. 4.  Tiny hiatal hernia seen. 5.  Relatively diffuse calcification along the abdominal aorta and its branches. 6.  Cholelithiasis again noted; gallbladder otherwise unremarkable in appearance.  These results were called by telephone on 05/25/2012 at 05:39 a.m. to [facility], who verbally acknowledged these results.   Original Report Authenticated By: Tonia Ghent,  M.D.    Dg Chest Portable 1 View  05/25/2012  *RADIOLOGY REPORT*  Clinical Data: History of COPD with abdominal pain and emesis.  PORTABLE CHEST - 1 VIEW  Comparison: 07/20/2011  Findings: Again noted are postoperative changes in left hemithorax. There are slightly low lung volumes.  No focal airspace disease or edema.  Heart size is upper limits normal but unchanged. Postoperative changes in the cervical spine.  IMPRESSION: Slightly low lung volumes with stable postoperative changes.  No acute findings.   Original Report Authenticated By: Richarda Overlie, M.D.     Scheduled Meds: . ipratropium  0.5 mg Nebulization QID   And  . albuterol  2.5 mg Nebulization QID  . ertapenem (INVANZ) IV  1 g Intravenous Q24H  . pantoprazole (PROTONIX) IV  40 mg Intravenous Q24H   Continuous Infusions: . dextrose 5% lactated ringers 125 mL (05/26/12 0929)    Principal Problem:   Acute perforated appendicitis with generalized peritonitis Active Problems:   HYPERLIPIDEMIA   AORTIC STENOSIS/ INSUFFICIENCY, NON-RHEUMATIC   C O P D   Stricture and stenosis of esophagus   Heart palpitations   CKD (chronic kidney disease)   Lung cancer   COPD, moderate    Time spent:    [facility]  [facility] Pager [phone].  If 7PM-7AM, please contact night-coverage at [email or web address], password [facility] 05/26/2012, 12:25 PM  LOS: 1 day

## 2012-05-26 NOTE — Progress Notes (Signed)
Asymptomatic tachycardia.  Heart rate greater than 140.  Patient is asymptomatic.  Dr. Craige Cotta, [facility] notified.   EKG performed. Orders received. Heart rate now 100 bpm.  Patient sleeping.

## 2012-05-26 NOTE — Significant Event (Signed)
Pt with tachycardia.  No symptoms.  Will give prn lopressor IV, check ECG and change from albuterol to xopenex.  Coralyn Helling, MD [facility] Pulmonary/Critical Care 05/26/2012, 3:31 PM

## 2012-05-26 NOTE — Progress Notes (Signed)
1 Day Post-Op  Subjective: Very sore.  Awake and alert.  Objective: Vital signs in last 24 hours: Temp:  [98.2 F (36.8 C)-102.6 F (39.2 C)] 98.2 F (36.8 C) (04/11 0400) Pulse Rate:  [71-99] 89 (04/11 0600) Resp:  [10-20] 10 (04/11 0600) BP: (137-172)/(34-76) 164/49 mmHg (04/11 0600) SpO2:  [91 %-100 %] 100 % (04/11 0600) Weight:  [163 lb 9.3 oz (74.2 kg)] 163 lb 9.3 oz (74.2 kg) (04/10 1002) Last BM Date: 05/24/12  Intake/Output from previous day: 04/10 0701 - 04/11 0700 In: 4755 [I.V.:4675; NG/GT:30; IV Piggyback:50] Out: 2390 [Urine:1180; Emesis/NG output:150; Drains:160; Blood:900] Intake/Output this shift:    PE: General- In NAD Abdomen-soft, dressing dry, tender at incision site, dark bloody drain output, no bowel sounds  Lab Results:   Recent Labs  05/25/12 1500 05/25/12 1518 05/26/12 0343  WBC 10.5  --  10.2  HGB 9.8* 12.6 10.2*  HCT 28.8* 37.0 30.6*  PLT 165  --  166   BMET  Recent Labs  05/25/12 0250 05/25/12 1518 05/26/12 0343  NA 137 138 135  K 4.6 3.7 4.4  CL 97  --  102  CO2 26  --  24  GLUCOSE 108* 121* 147*  BUN 29*  --  16  CREATININE 1.50*  --  1.11*  CALCIUM 9.1  --  8.0*   PT/INR No results found for this basename: LABPROT, INR,  in the last 72 hours Comprehensive Metabolic Panel:    Component Value Date/Time   NA 135 05/26/2012 0343   NA 147* 09/20/2011 0817   K 4.4 05/26/2012 0343   K 4.2 09/20/2011 0817   CL 102 05/26/2012 0343   CL 100 09/20/2011 0817   CO2 24 05/26/2012 0343   CO2 29 09/20/2011 0817   BUN 16 05/26/2012 0343   BUN 20 09/20/2011 0817   CREATININE 1.11* 05/26/2012 0343   CREATININE 1.6* 09/20/2011 0817   GLUCOSE 147* 05/26/2012 0343   GLUCOSE 88 09/20/2011 0817   CALCIUM 8.0* 05/26/2012 0343   CALCIUM 9.1 09/20/2011 0817   AST 14 05/25/2012 0250   AST 17 09/20/2011 0817   ALT 11 05/25/2012 0250   ALKPHOS 52 05/25/2012 0250   ALKPHOS 69 09/20/2011 0817   BILITOT 0.4 05/25/2012 0250   BILITOT 0.60 09/20/2011 0817   PROT 8.1  05/25/2012 0250   PROT 7.4 09/20/2011 0817   ALBUMIN 4.0 05/25/2012 0250     Studies/Results: Ct Abdomen Pelvis W Contrast  05/25/2012  *RADIOLOGY REPORT*  Clinical Data: Mid abdominal pain, nausea, vomiting and loose stools.  CT ABDOMEN AND PELVIS WITH CONTRAST  Technique:  Multidetector CT imaging of the abdomen and pelvis was performed following the standard protocol during bolus administration of intravenous contrast.  Contrast: 80mL OMNIPAQUE IOHEXOL 300 MG/ML  SOLN  Comparison: PET/CT performed 06/27/2008, and MRI of the lumbar spine performed 10/24/2010; abdominal ultrasound performed 03/09/2007  Findings: The visualized lung bases are clear.  There is mild nonspecific prominence of the intrahepatic biliary ducts.  The liver is otherwise unremarkable in appearance.  The spleen is within normal limits.  Scattered small stones are seen layering dependently in the gallbladder; the gallbladder is otherwise unremarkable in appearance.  The pancreas and adrenal glands are unremarkable.  Small bilateral renal stones are seen, measuring up to 5 mm in size.  Tiny hypodensities within both kidneys likely reflect small cysts.  The kidneys are otherwise unremarkable in appearance. Minimal nonspecific perinephric stranding is noted bilaterally. There is no evidence of hydronephrosis.  No obstructing ureteral stones are seen.  No free fluid is identified.  The small bowel is unremarkable in appearance.  A tiny hiatal hernia is noted; the stomach is otherwise unremarkable.  No acute vascular abnormalities are seen. Relatively diffuse calcification is noted along the abdominal aorta and its branches.  The appendix is dilated to 1.3 cm in maximal diameter, with surrounding soft tissue inflammation and trace fluid.  Trace associated fluid is noted within the pelvis.  This is compatible with acute appendicitis.  There is no definite evidence for perforation or abscess formation at this time.  Scattered diverticulosis is  noted about the entirety of the colon, more prominent along the distal ascending and transverse colon. The colon is otherwise grossly unremarkable in appearance.  The bladder is moderately distended and grossly unremarkable.  The patient is status post hysterectomy.  No suspicious adnexal masses are seen.  No inguinal lymphadenopathy is seen.  No acute osseous abnormalities are identified.  Vacuum phenomenon is noted at L4-L5.  IMPRESSION:  1.  Acute appendicitis, with dilatation of the appendix to 1.3 cm in diameter and surrounding soft tissue inflammation.  Trace fluid noted about the appendix, and within the pelvis.  No evidence for perforation or abscess formation at this time. 2.  Scattered diverticulosis noted about the entirety of the colon, more prominent along the distal ascending and transverse colon. 3.  Small bilateral renal stones, measuring up to 5 mm in size. Tiny bilateral renal cysts seen. 4.  Tiny hiatal hernia seen. 5.  Relatively diffuse calcification along the abdominal aorta and its branches. 6.  Cholelithiasis again noted; gallbladder otherwise unremarkable in appearance.  These results were called by telephone on 05/25/2012 at 05:39 a.m. to [facility] PA, who verbally acknowledged these results.   Original Report Authenticated By: Tonia Ghent, M.D.    Dg Chest Portable 1 View  05/25/2012  *RADIOLOGY REPORT*  Clinical Data: History of COPD with abdominal pain and emesis.  PORTABLE CHEST - 1 VIEW  Comparison: 07/20/2011  Findings: Again noted are postoperative changes in left hemithorax. There are slightly low lung volumes.  No focal airspace disease or edema.  Heart size is upper limits normal but unchanged. Postoperative changes in the cervical spine.  IMPRESSION: Slightly low lung volumes with stable postoperative changes.  No acute findings.   Original Report Authenticated By: Richarda Overlie, M.D.     Anti-infectives: Anti-infectives   Start     Dose/Rate Route Frequency Ordered Stop    05/26/12 0600  ertapenem (INVANZ) 1 g in sodium chloride 0.9 % 50 mL IVPB  Status:  Discontinued     1 g 100 mL/hr over 30 Minutes Intravenous Every 24 hours 05/25/12 0733 05/25/12 1730   05/26/12 0600  ertapenem (INVANZ) 1 g in sodium chloride 0.9 % 50 mL IVPB     1 g 100 mL/hr over 30 Minutes Intravenous Every 24 hours 05/25/12 1729     05/25/12 0545  ertapenem (INVANZ) 1 g in sodium chloride 0.9 % 50 mL IVPB     1 g 100 mL/hr over 30 Minutes Intravenous  Once 05/25/12 0540 05/25/12 0716      Assessment Principal Problem:   Acute perforated appendicitis with generalized peritonitis s/p exploratory laparotomy, appendectomy, small bowel resection 05/26/12 Active Problems:   AORTIC STENOSIS/ INSUFFICIENCY, NON-RHEUMATIC   CKD (chronic kidney disease)   Lung cancer   COPD, moderate    LOS: 1 day   Plan: Start dressing changes tomorrow.  She is at high  risk for post op ileus and abscess formation.   Wyndi Northrup J 05/26/2012

## 2012-05-27 DIAGNOSIS — N189 Chronic kidney disease, unspecified: Secondary | ICD-10-CM

## 2012-05-27 DIAGNOSIS — D509 Iron deficiency anemia, unspecified: Secondary | ICD-10-CM

## 2012-05-27 LAB — CBC
HCT: 25.4 % — ABNORMAL LOW (ref 36.0–46.0)
Hemoglobin: 8.3 g/dL — ABNORMAL LOW (ref 12.0–15.0)
MCV: 101.2 fL — ABNORMAL HIGH (ref 78.0–100.0)
RBC: 2.51 MIL/uL — ABNORMAL LOW (ref 3.87–5.11)
RDW: 14 % (ref 11.5–15.5)
WBC: 11.2 10*3/uL — ABNORMAL HIGH (ref 4.0–10.5)

## 2012-05-27 LAB — BASIC METABOLIC PANEL
BUN: 22 mg/dL (ref 6–23)
CO2: 24 mEq/L (ref 19–32)
Chloride: 103 mEq/L (ref 96–112)
Creatinine, Ser: 1.58 mg/dL — ABNORMAL HIGH (ref 0.50–1.10)
Glucose, Bld: 102 mg/dL — ABNORMAL HIGH (ref 70–99)

## 2012-05-27 MED ORDER — ENOXAPARIN SODIUM 40 MG/0.4ML ~~LOC~~ SOLN
40.0000 mg | SUBCUTANEOUS | Status: DC
  Administered 2012-05-27 – 2012-06-06 (×10): 40 mg via SUBCUTANEOUS
  Filled 2012-05-27 (×11): qty 0.4

## 2012-05-27 MED ORDER — METOPROLOL TARTRATE 1 MG/ML IV SOLN
5.0000 mg | INTRAVENOUS | Status: DC
  Administered 2012-05-27 – 2012-05-30 (×10): 5 mg via INTRAVENOUS
  Filled 2012-05-27 (×20): qty 5

## 2012-05-27 MED ORDER — HYDRALAZINE HCL 20 MG/ML IJ SOLN
5.0000 mg | Freq: Four times a day (QID) | INTRAMUSCULAR | Status: DC
  Administered 2012-05-28: 5 mg via INTRAVENOUS
  Filled 2012-05-27 (×8): qty 0.25

## 2012-05-27 NOTE — Progress Notes (Signed)
Patient ID: [name], female   DOB: 05/04/1940, 72 y.o.   MRN: [id number] [facility] Progress Note:   2 Days Post-Op  Subjective: Mental status is somewhat cloudy due to meds Objective: Vital signs in last 24 hours: Temp:  [98 F (36.7 C)-99.9 F (37.7 C)] 98.9 F (37.2 C) (04/12 0800) Pulse Rate:  [94-139] 100 (04/12 0600) Resp:  [13-20] 15 (04/12 0600) BP: (111-173)/(30-64) 127/37 mmHg (04/12 0625) SpO2:  [97 %-100 %] 98 % (04/12 0743)  Intake/Output from previous day: 04/11 0701 - 04/12 0700 In: 2015 [I.V.:1875; NG/GT:30; IV Piggyback:50] Out: 900 [Urine:570; Emesis/NG output:275; Drains:55] Intake/Output this shift:    Physical Exam: Work of breathing is normal.  Incision is transverse; not bleeding; no purulence-will do wet to dry  Lab Results:  Results for orders placed during the hospital encounter of 05/25/12 (from the past 48 hour(s))  SURGICAL PCR SCREEN     Status: None   Collection Time    05/25/12 10:52 AM      Result Value Range   MRSA, PCR NEGATIVE  NEGATIVE   Staphylococcus aureus NEGATIVE  NEGATIVE   Comment:            The Xpert SA Assay (FDA     approved for NASAL specimens     in patients over 43 years of age),     is one component of     a comprehensive surveillance     program.  Test performance has     been validated by [employer] for patients greater     than or equal to 74 year old.     It is not intended     to diagnose infection nor to     guide or monitor treatment.  TYPE AND SCREEN     Status: None   Collection Time    05/25/12  3:00 PM      Result Value Range   ABO/RH(D) O POS     Antibody Screen NEG     Sample Expiration 05/28/2012    CBC     Status: Abnormal   Collection Time    05/25/12  3:00 PM      Result Value Range   WBC 10.5  4.0 - 10.5 K/uL   RBC 2.93 (*) 3.87 - 5.11 MIL/uL   Hemoglobin 9.8 (*) 12.0 - 15.0 g/dL   Comment: DELTA CHECK NOTED   HCT 28.8 (*) 36.0 - 46.0 %   MCV 98.3  78.0 - 100.0 fL    MCH 33.4  26.0 - 34.0 pg   MCHC 34.0  30.0 - 36.0 g/dL   RDW 40.9  81.1 - 91.4 %   Platelets 165  150 - 400 K/uL  ABO/RH     Status: None   Collection Time    05/25/12  3:00 PM      Result Value Range   ABO/RH(D) O POS    POCT I-STAT 4, (NA,K, GLUC, HGB,HCT)     Status: Abnormal   Collection Time    05/25/12  3:18 PM      Result Value Range   Sodium 138  135 - 145 mEq/L   Potassium 3.7  3.5 - 5.1 mEq/L   Glucose, Bld 121 (*) 70 - 99 mg/dL   HCT 78.2  95.6 - 21.3 %   Hemoglobin 12.6  12.0 - 15.0 g/dL  CBC     Status: Abnormal   Collection Time  05/26/12  3:43 AM      Result Value Range   WBC 10.2  4.0 - 10.5 K/uL   RBC 3.09 (*) 3.87 - 5.11 MIL/uL   Hemoglobin 10.2 (*) 12.0 - 15.0 g/dL   Comment: DELTA CHECK NOTED     REPEATED TO VERIFY   HCT 30.6 (*) 36.0 - 46.0 %   MCV 99.0  78.0 - 100.0 fL   MCH 33.0  26.0 - 34.0 pg   MCHC 33.3  30.0 - 36.0 g/dL   RDW 29.5  62.1 - 30.8 %   Platelets 166  150 - 400 K/uL  BASIC METABOLIC PANEL     Status: Abnormal   Collection Time    05/26/12  3:43 AM      Result Value Range   Sodium 135  135 - 145 mEq/L   Potassium 4.4  3.5 - 5.1 mEq/L   Chloride 102  96 - 112 mEq/L   CO2 24  19 - 32 mEq/L   Glucose, Bld 147 (*) 70 - 99 mg/dL   BUN 16  6 - 23 mg/dL   Comment: RESULT REPEATED AND VERIFIED     DELTA CHECK NOTED   Creatinine, Ser 1.11 (*) 0.50 - 1.10 mg/dL   Calcium 8.0 (*) 8.4 - 10.5 mg/dL   GFR calc non Af Amer 49 (*) >90 mL/min   GFR calc Af Amer 57 (*) >90 mL/min   Comment:            The eGFR has been calculated     using the CKD EPI equation.     This calculation has not been     validated in all clinical     situations.     eGFR's persistently     <90 mL/min signify     possible Chronic Kidney Disease.  CBC     Status: Abnormal   Collection Time    05/27/12  3:50 AM      Result Value Range   WBC 11.2 (*) 4.0 - 10.5 K/uL   RBC 2.51 (*) 3.87 - 5.11 MIL/uL   Hemoglobin 8.3 (*) 12.0 - 15.0 g/dL   Comment: RESULT  REPEATED AND VERIFIED     DELTA CHECK NOTED   HCT 25.4 (*) 36.0 - 46.0 %   MCV 101.2 (*) 78.0 - 100.0 fL   MCH 33.1  26.0 - 34.0 pg   MCHC 32.7  30.0 - 36.0 g/dL   RDW 65.7  84.6 - 96.2 %   Platelets 154  150 - 400 K/uL  BASIC METABOLIC PANEL     Status: Abnormal   Collection Time    05/27/12  3:50 AM      Result Value Range   Sodium 138  135 - 145 mEq/L   Potassium 4.1  3.5 - 5.1 mEq/L   Chloride 103  96 - 112 mEq/L   CO2 24  19 - 32 mEq/L   Glucose, Bld 102 (*) 70 - 99 mg/dL   BUN 22  6 - 23 mg/dL   Creatinine, Ser 9.52 (*) 0.50 - 1.10 mg/dL   Calcium 8.0 (*) 8.4 - 10.5 mg/dL   GFR calc non Af Amer 32 (*) >90 mL/min   GFR calc Af Amer 37 (*) >90 mL/min   Comment:            The eGFR has been calculated     using the CKD EPI equation.     This calculation has not been  validated in all clinical     situations.     eGFR's persistently     <90 mL/min signify     possible Chronic Kidney Disease.    Radiology/Results: No results found.  Anti-infectives: Anti-infectives   Start     Dose/Rate Route Frequency Ordered Stop   05/26/12 0600  ertapenem (INVANZ) 1 g in sodium chloride 0.9 % 50 mL IVPB  Status:  Discontinued     1 g 100 mL/hr over 30 Minutes Intravenous Every 24 hours 05/25/12 0733 05/25/12 1730   05/26/12 0600  ertapenem (INVANZ) 1 g in sodium chloride 0.9 % 50 mL IVPB     1 g 100 mL/hr over 30 Minutes Intravenous Every 24 hours 05/25/12 1729     05/25/12 0545  ertapenem (INVANZ) 1 g in sodium chloride 0.9 % 50 mL IVPB     1 g 100 mL/hr over 30 Minutes Intravenous  Once 05/25/12 0540 05/25/12 0716      Assessment/Plan: Problem List: Patient Active Problem List  Diagnosis  . CARCINOMA, LUNG, SQUAMOUS CELL  . HYPERLIPIDEMIA  . ANEMIA-IRON DEFICIENCY  . ANXIETY  . DEPRESSION  . HYPERTENSION  . PERICARDITIS  . AORTIC STENOSIS/ INSUFFICIENCY, NON-RHEUMATIC  . SINUSITIS, CHRONIC  . ALLERGIC RHINITIS  . C O P D  . Stricture and stenosis of esophagus   . BARRETTS ESOPHAGUS  . DYSPEPSIA  . OSTEOARTHRITIS  . OSTEOARTHRITIS, KNEE, LEFT  . SPONDYLOSIS, CERVICAL, WITH RADICULOPATHY  . BACK PAIN  . BURSITIS, LEFT HIP  . OSTEOPOROSIS  . INSOMNIA-SLEEP DISORDER-UNSPEC  . FATIGUE  . PERIPHERAL EDEMA  . EPISTAXIS, RECURRENT  . MURMUR  . Tuberculin Test Reaction  . CHOLELITHIASIS  . NEPHROLITHIASIS, HX OF  . Preventative health care  . Polyarthralgia  . Elevated sed rate  . Positive ANA (antinuclear antibody)  . Rheumatoid factor positive  . Cervical radiculopathy  . Lumbar radiculopathy  . Pre-operative cardiovascular examination  . Cervical cord compression with myelopathy  . Heart palpitations  . Palpitations  . Non-sustained ventricular tachycardia  . Mitral regurgitation  . Hoarseness  . Dysphagia  . CKD (chronic kidney disease)  . Hand cramps  . Lung cancer  . Plantar fasciitis of left foot  . COPD, moderate  . Acute perforated appendicitis with generalized peritonitis    Post perforated appendicitis.  Will transfer to floor.  2 Days Post-Op    LOS: 2 days   Matt B. Daphine Deutscher, MD, [facility] [phone] beeper [phone]  05/27/2012 8:46 AM

## 2012-05-27 NOTE — Progress Notes (Signed)
Handoff report called to Belenda Cruise, [employer] fundraiser.  Patient alert and oriented. Family at bedside.  Transferred via bed to room 1442.

## 2012-05-27 NOTE — Progress Notes (Signed)
PULMONARY  / CRITICAL CARE MEDICINE  Name: [name] MRN: [id number] DOB: 05/17/1940    ADMISSION DATE:  05/25/2012 CONSULTATION DATE:  05/25/2012  REFERRING MD : Elvera Lennox  CHIEF COMPLAINT:  Abdominal Pain  CULTURES:  ANTIBIOTICS: 4/10- Invanz>>>  HISTORY OF PRESENT ILLNESS:  72 y/o AF presented to ED with  acute suspected appendicitis. PCCM consult for surgery clearance.  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 She has mild AS   EVENTS - 4/10 open appendectomy & small bowel resection - for peritonitis, perforated gangrenous appendix   SUBJECTIVE: denies  Dyspnea C/o abdominal pain - somewhat better on dilaudid  VITAL SIGNS: Temp:  [98 F (36.7 C)-99.9 F (37.7 C)] 98.9 F (37.2 C) (04/12 0800) Pulse Rate:  [100-139] 106 (04/12 0800) Resp:  [13-20] 14 (04/12 0800) BP: (111-156)/(30-64) 138/42 mmHg (04/12 0800) SpO2:  [97 %-100 %] 100 % (04/12 0800) HEMODYNAMICS:   VENTILATOR SETTINGS:   INTAKE / OUTPUT: Intake/Output     04/11 0701 - 04/12 0700 04/12 0701 - 04/13 0700   I.V. (mL/kg) 1950 (26.3) 150 (2)   Other 60    NG/GT 30    IV Piggyback 50    Total Intake(mL/kg) 2090 (28.2) 150 (2)   Urine (mL/kg/hr) 570 (0.3) 100 (0.3)   Emesis/NG output 275 (0.2)    Drains 55 (0)    Blood     Total Output 900 100   Net +1190 +50          PHYSICAL EXAMINATION: General:  Well developed and well nourished in mild distress Neuro: Awake and alert, non focal HEENT:   No jvd Cardiovascular:  Regular rate and rhythm,  Lungs:  No use of accessory muscles. Decreased without rales, wheezing, and rhonchi.  Abdomen: Soft.  Diffusely tender.no guarding or rigdity Musculoskeletal:  No deformities Skin:  Warm and dry  LABS:  Recent Labs Lab 05/25/12 0250 05/25/12 1500 05/25/12 1518 05/26/12 0343 05/27/12 0350  HGB 13.3 9.8* 12.6 10.2*  8.3*  WBC 8.6 10.5  --  10.2 11.2*  PLT 204 165  --  166 154  NA 137  --  138 135 138  K 4.6  --  3.7 4.4 4.1  CL 97  --   --  102 103  CO2 26  --   --  24 24  GLUCOSE 108*  --  121* 147* 102*  BUN 29*  --   --  16 22  CREATININE 1.50*  --   --  1.11* 1.58*  CALCIUM 9.1  --   --  8.0* 8.0*  AST 14  --   --   --   --   ALT 11  --   --   --   --   ALKPHOS 52  --   --   --   --   BILITOT 0.4  --   --   --   --   PROT 8.1  --   --   --   --   ALBUMIN 4.0  --   --   --   --    No results found for this basename: GLUCAP,  in the last 168 hours  No results found.  ASSESSMENT / PLAN:  PULMONARY A: COPD S/P LUL Lobectomy 5/10 for stage 1 a squamous cell CA -extubated post  op P:   -ct albuterol and atrovent nebs q6 hrs perioperatively - incentive spirometry postop -oob & DVT prophylaxis post op as routine - watch for resp depression with pain meds   GASTROINTESTINAL A: perforated appendicitis, small bowel resection, peritonitis P:   -per surgery  - Ertapenem   Pain -  Dilaudid prn   Renal - Oliguria - agree with fluids  PCCM tos ign off ,pl call as needed   [facility]   Pulmonary and Critical Care Medicine [facility] Pager: [phone]  05/27/2012, 10:55 AM

## 2012-05-27 NOTE — Plan of Care (Signed)
Problem: Phase I Progression Outcomes Goal: Pain controlled with appropriate interventions Outcome: Progressing Requiring IV pain medications every one to two hours. Goal: OOB as tolerated unless otherwise ordered Outcome: Progressing Out of bed 05-27-12 to chair. Goal: Sutures/staples intact Outcome: Progressing Sutures intact.

## 2012-05-27 NOTE — Progress Notes (Addendum)
[facility] PROGRESS NOTE  [name] DGL:[id number] DOB: 12-04-1940 DOA: 05/25/2012 PCP: [name], MD  Assessment/Plan: 1. Perforated Gangrenous appendicitis: ex lap, lysis of adhesions, appendectomy, small bowel resection 4/10 -POD #2 -NGT, IVF, NPO -per CCS -start DVT prophylaxis today  2. COPD: stable, albuterol nebs PRN -incentive spirometry Q1H  3. H/o grade 1 diastolic dysfunction/mild AS/AI -stable, cut down IVF to 75cc/hr yesterday -3L positive so far, monitor volume status closely  4. HTN: -IV hydralazine and metoprolol while NPO  5. Tachycardia: -post op pain and Beta blocker withdrawal -takes ateonolol at home, -start scheduled IV lopressor  5. H/o lung CA -s/p left upper lobe lobectomy in 2010  6. CKD: -baseline creatinine 1.4-1.6 -stable, monitor  DVT proph: start lovenox   TX to tele if ok with CCS  Consultants:  [facility]  [facility]  [facility]  Procedures: PROCEDURE: Laparoscopy, exploratory laparotomy, lysis of adhesions, appendectomy, small bowel resection 4/10   Antibiotics:  Ertapenem  HPI/Subjective: C/o some abd soreness, breathing ok, no flatus  Objective: Filed Vitals:   05/27/12 0400 05/27/12 0600 05/27/12 0625 05/27/12 0743  BP: 111/30 121/35 127/37   Pulse: 103 100    Temp:      TempSrc:      Resp: 16 15    Height:      Weight:      SpO2: 98% 98%  98%    Intake/Output Summary (Last 24 hours) at 05/27/12 0755 Last data filed at 05/27/12 0626  Gross per 24 hour  Intake   2015 ml  Output    900 ml  Net   1115 ml   Filed Weights   05/25/12 1002  Weight: 74.2 kg (163 lb 9.3 oz)    Exam:   General:  AAOx3  Cardiovascular: S1S2/RRR  Respiratory: decreased BS at bases  Abdomen: soft, distended, tenderness as expected, dressing, BS absent  Ext: no edema c/c  Neuro: moves all extremities   Data Reviewed: Basic Metabolic Panel:  Recent Labs Lab 05/25/12 0250 05/25/12 1518  05/26/12 0343 05/27/12 0350  NA 137 138 135 138  K 4.6 3.7 4.4 4.1  CL 97  --  102 103  CO2 26  --  24 24  GLUCOSE 108* 121* 147* 102*  BUN 29*  --  16 22  CREATININE 1.50*  --  1.11* 1.58*  CALCIUM 9.1  --  8.0* 8.0*   Liver Function Tests:  Recent Labs Lab 05/25/12 0250  AST 14  ALT 11  ALKPHOS 52  BILITOT 0.4  PROT 8.1  ALBUMIN 4.0    Recent Labs Lab 05/25/12 0250  LIPASE 27   No results found for this basename: AMMONIA,  in the last 168 hours CBC:  Recent Labs Lab 05/25/12 0250 05/25/12 1500 05/25/12 1518 05/26/12 0343 05/27/12 0350  WBC 8.6 10.5  --  10.2 11.2*  NEUTROABS 6.8  --   --   --   --   HGB 13.3 9.8* 12.6 10.2* 8.3*  HCT 40.2 28.8* 37.0 30.6* 25.4*  MCV 99.0 98.3  --  99.0 101.2*  PLT 204 165  --  166 154   Cardiac Enzymes: No results found for this basename: CKTOTAL, CKMB, CKMBINDEX, TROPONINI,  in the last 168 hours BNP (last 3 results) No results found for this basename: PROBNP,  in the last 8760 hours CBG: No results found for this basename: GLUCAP,  in the last 168 hours  Recent Results (from the past 240 hour(s))  SURGICAL PCR  SCREEN     Status: None   Collection Time    05/25/12 10:52 AM      Result Value Range Status   MRSA, PCR NEGATIVE  NEGATIVE Final   Staphylococcus aureus NEGATIVE  NEGATIVE Final   Comment:            The Xpert SA Assay (FDA     approved for NASAL specimens     in patients over 63 years of age),     is one component of     a comprehensive surveillance     program.  Test performance has     been validated by [employer] for patients greater     than or equal to 8 year old.     It is not intended     to diagnose infection nor to     guide or monitor treatment.     Studies: No results found.  Scheduled Meds: . enoxaparin (LOVENOX) injection  40 mg Subcutaneous Q24H  . ertapenem (INVANZ) IV  1 g Intravenous Q24H  . hydrALAZINE  5 mg Intravenous Q6H  . ipratropium  0.5 mg Nebulization QID   . levalbuterol  0.63 mg Nebulization QID  . metoprolol  5 mg Intravenous Q4H  . pantoprazole (PROTONIX) IV  40 mg Intravenous Q24H   Continuous Infusions: . sodium chloride 75 mL/hr at 05/27/12 1610    Principal Problem:   Acute perforated appendicitis with generalized peritonitis Active Problems:   HYPERLIPIDEMIA   AORTIC STENOSIS/ INSUFFICIENCY, NON-RHEUMATIC   C O P D   Stricture and stenosis of esophagus   Heart palpitations   CKD (chronic kidney disease)   Lung cancer   COPD, moderate    Time spent:    [facility]  [facility] Pager [phone]. If 7PM-7AM, please contact night-coverage at [email or web address], password [facility] 05/27/2012, 7:55 AM  LOS: 2 days

## 2012-05-28 LAB — CBC
HCT: 26.4 % — ABNORMAL LOW (ref 36.0–46.0)
MCH: 33.1 pg (ref 26.0–34.0)
MCHC: 33 g/dL (ref 30.0–36.0)
MCV: 100.4 fL — ABNORMAL HIGH (ref 78.0–100.0)
RDW: 14.5 % (ref 11.5–15.5)

## 2012-05-28 LAB — BASIC METABOLIC PANEL
BUN: 23 mg/dL (ref 6–23)
Creatinine, Ser: 1.27 mg/dL — ABNORMAL HIGH (ref 0.50–1.10)
GFR calc Af Amer: 48 mL/min — ABNORMAL LOW (ref >90)
GFR calc non Af Amer: 41 mL/min — ABNORMAL LOW (ref >90)

## 2012-05-28 MED ORDER — LEVALBUTEROL HCL 0.63 MG/3ML IN NEBU
0.6300 mg | INHALATION_SOLUTION | Freq: Three times a day (TID) | RESPIRATORY_TRACT | Status: DC
  Administered 2012-05-29 – 2012-06-06 (×25): 0.63 mg via RESPIRATORY_TRACT
  Filled 2012-05-28 (×31): qty 3

## 2012-05-28 MED ORDER — IPRATROPIUM BROMIDE 0.02 % IN SOLN
0.5000 mg | Freq: Three times a day (TID) | RESPIRATORY_TRACT | Status: DC
  Administered 2012-05-29 – 2012-06-06 (×25): 0.5 mg via RESPIRATORY_TRACT
  Filled 2012-05-28 (×26): qty 2.5

## 2012-05-28 NOTE — Progress Notes (Signed)
Patient ID: [name], female   DOB: [birth year], 72 y.o.   MRN: [id number] [facility] Progress Note:   3 Days Post-Op  Subjective: Mental status is more clear.  Getting up to use bathroom Objective: Vital signs in last 24 hours: Temp:  [98.6 F (37 C)-99.6 F (37.6 C)] 98.6 F (37 C) (04/13 0627) Pulse Rate:  [91-104] 104 (04/13 0627) Resp:  [14-21] 20 (04/13 0627) BP: (103-148)/(34-56) 148/56 mmHg (04/13 0627) SpO2:  [92 %-100 %] 98 % (04/13 0733)  Intake/Output from previous day: 04/12 0701 - 04/13 0700 In: 1787.5 [I.V.:1707.5; NG/GT:30; IV Piggyback:50] Out: 890 [Urine:525; Emesis/NG output:300; Drains:65] Intake/Output this shift:    Physical Exam: Work of breathing is normal.  Passing flatus.    Lab Results:  Results for orders placed during the hospital encounter of 05/25/12 (from the past 48 hour(s))  CBC     Status: Abnormal   Collection Time    05/27/12  3:50 AM      Result Value Range   WBC 11.2 (*) 4.0 - 10.5 K/uL   RBC 2.51 (*) 3.87 - 5.11 MIL/uL   Hemoglobin 8.3 (*) 12.0 - 15.0 g/dL   Comment: RESULT REPEATED AND VERIFIED     DELTA CHECK NOTED   HCT 25.4 (*) 36.0 - 46.0 %   MCV 101.2 (*) 78.0 - 100.0 fL   MCH 33.1  26.0 - 34.0 pg   MCHC 32.7  30.0 - 36.0 g/dL   RDW 11.9  14.7 - 82.9 %   Platelets 154  150 - 400 K/uL  BASIC METABOLIC PANEL     Status: Abnormal   Collection Time    05/27/12  3:50 AM      Result Value Range   Sodium 138  135 - 145 mEq/L   Potassium 4.1  3.5 - 5.1 mEq/L   Chloride 103  96 - 112 mEq/L   CO2 24  19 - 32 mEq/L   Glucose, Bld 102 (*) 70 - 99 mg/dL   BUN 22  6 - 23 mg/dL   Creatinine, Ser 5.62 (*) 0.50 - 1.10 mg/dL   Calcium 8.0 (*) 8.4 - 10.5 mg/dL   GFR calc non Af Amer 32 (*) >90 mL/min   GFR calc Af Amer 37 (*) >90 mL/min   Comment:            The eGFR has been calculated     using the CKD EPI equation.     This calculation has not been     validated in all clinical     situations.     eGFR's  persistently     <90 mL/min signify     possible Chronic Kidney Disease.  CBC     Status: Abnormal   Collection Time    05/28/12  6:01 AM      Result Value Range   WBC 11.2 (*) 4.0 - 10.5 K/uL   RBC 2.63 (*) 3.87 - 5.11 MIL/uL   Hemoglobin 8.7 (*) 12.0 - 15.0 g/dL   HCT 13.0 (*) 86.5 - 78.4 %   MCV 100.4 (*) 78.0 - 100.0 fL   MCH 33.1  26.0 - 34.0 pg   MCHC 33.0  30.0 - 36.0 g/dL   RDW 69.6  29.5 - 28.4 %   Platelets 165  150 - 400 K/uL  BASIC METABOLIC PANEL     Status: Abnormal   Collection Time    05/28/12  6:01 AM  Result Value Range   Sodium 139  135 - 145 mEq/L   Potassium 4.3  3.5 - 5.1 mEq/L   Chloride 104  96 - 112 mEq/L   CO2 21  19 - 32 mEq/L   Glucose, Bld 71  70 - 99 mg/dL   BUN 23  6 - 23 mg/dL   Creatinine, Ser 1.61 (*) 0.50 - 1.10 mg/dL   Calcium 8.6  8.4 - 09.6 mg/dL   GFR calc non Af Amer 41 (*) >90 mL/min   GFR calc Af Amer 48 (*) >90 mL/min   Comment:            The eGFR has been calculated     using the CKD EPI equation.     This calculation has not been     validated in all clinical     situations.     eGFR's persistently     <90 mL/min signify     possible Chronic Kidney Disease.    Radiology/Results: No results found.  Anti-infectives: Anti-infectives   Start     Dose/Rate Route Frequency Ordered Stop   05/26/12 0600  ertapenem (INVANZ) 1 g in sodium chloride 0.9 % 50 mL IVPB  Status:  Discontinued     1 g 100 mL/hr over 30 Minutes Intravenous Every 24 hours 05/25/12 0733 05/25/12 1730   05/26/12 0600  ertapenem (INVANZ) 1 g in sodium chloride 0.9 % 50 mL IVPB     1 g 100 mL/hr over 30 Minutes Intravenous Every 24 hours 05/25/12 1729     05/25/12 0545  ertapenem (INVANZ) 1 g in sodium chloride 0.9 % 50 mL IVPB     1 g 100 mL/hr over 30 Minutes Intravenous  Once 05/25/12 0540 05/25/12 0716      Assessment/Plan: Problem List: Patient Active Problem List  Diagnosis  . CARCINOMA, LUNG, SQUAMOUS CELL  . HYPERLIPIDEMIA  .  ANEMIA-IRON DEFICIENCY  . ANXIETY  . DEPRESSION  . HYPERTENSION  . PERICARDITIS  . AORTIC STENOSIS/ INSUFFICIENCY, NON-RHEUMATIC  . SINUSITIS, CHRONIC  . ALLERGIC RHINITIS  . C O P D  . Stricture and stenosis of esophagus  . BARRETTS ESOPHAGUS  . DYSPEPSIA  . OSTEOARTHRITIS  . OSTEOARTHRITIS, KNEE, LEFT  . SPONDYLOSIS, CERVICAL, WITH RADICULOPATHY  . BACK PAIN  . BURSITIS, LEFT HIP  . OSTEOPOROSIS  . INSOMNIA-SLEEP DISORDER-UNSPEC  . FATIGUE  . PERIPHERAL EDEMA  . EPISTAXIS, RECURRENT  . MURMUR  . Tuberculin Test Reaction  . CHOLELITHIASIS  . NEPHROLITHIASIS, HX OF  . Preventative health care  . Polyarthralgia  . Elevated sed rate  . Positive ANA (antinuclear antibody)  . Rheumatoid factor positive  . Cervical radiculopathy  . Lumbar radiculopathy  . Pre-operative cardiovascular examination  . Cervical cord compression with myelopathy  . Heart palpitations  . Palpitations  . Non-sustained ventricular tachycardia  . Mitral regurgitation  . Hoarseness  . Dysphagia  . CKD (chronic kidney disease)  . Hand cramps  . Lung cancer  . Plantar fasciitis of left foot  . COPD, moderate  . Acute perforated appendicitis with generalized peritonitis    Will dc NG and start clear liquids 3 Days Post-Op    LOS: 3 days   Matt B. Daphine Deutscher, MD, [facility] [phone] beeper [phone]  05/28/2012 8:51 AM

## 2012-05-28 NOTE — Progress Notes (Signed)
Pt asymptomatic.  Vital signs stable.  No complaints of chest pain.  Telemetry strip appears to be [place].  MD notified.  No new orders received.  Report given to night nurse.

## 2012-05-28 NOTE — Evaluation (Signed)
Physical Therapy Evaluation Patient Details Name: [name] MRN: [id number] DOB: [birth year] Today's Date: 05/28/2012 Time: 1478-2956 PT Time Calculation (min): 19 min  PT Assessment / Plan / Recommendation Clinical Impression  Pt is a 72 year old female s/p open appendectomy & small bowel resection for peritonitis, perforated gangrenous appendix on 4/10.  Pt would benefit from acute PT services in order to improve independence with transfers and ambulation by increasing activity tolerance and decreasing pain.  Pt reports previously independent and lher nephew lives with her but not present 24/7.      PT Assessment  Patient needs continued PT services    Follow Up Recommendations  Supervision/Assistance - 24 hour;SNF    Does the patient have the potential to tolerate intense rehabilitation      Barriers to Discharge Decreased caregiver support      Equipment Recommendations  Rolling walker with 5" wheels    Recommendations for Other Services     Frequency Min 3X/week    Precautions / Restrictions Precautions Precautions: Fall Precaution Comments: monitor sats, L abdominal drain Restrictions Weight Bearing Restrictions: No   Pertinent Vitals/Pain Increased L abdominal pain with mobility, RN notified  SATURATION QUALIFICATIONS: (This note is used to comply with regulatory documentation for home oxygen)  Patient Saturations on Room Air at Rest = 92%  Patient Saturations on Room Air while Ambulating = 85%  Patient Saturations on 3 Liters of oxygen while Ambulating = 92%  Please briefly explain why patient needs home oxygen: To maintain saturations above 88% during ambulation     Mobility  Bed Mobility Bed Mobility: Supine to Sit Supine to Sit: 3: Mod assist Details for Bed Mobility Assistance: assist for trunk upright Transfers Transfers: Sit to Stand;Stand to Sit Sit to Stand: 4: Min assist;With upper extremity assist;From bed;From chair/3-in-1 Stand to Sit: 4:  Min assist;With upper extremity assist;To chair/3-in-1 Details for Transfer Assistance: verbal cues for safe technique, assist to rise and control descent Ambulation/Gait Ambulation/Gait Assistance: 4: Min assist Ambulation Distance (Feet): 80 Feet (total) Assistive device: Rolling walker Ambulation/Gait Assistance Details: pt ambulated over to [facility] then in hallway, SaO2 upon reaching BSC on room air was 85% so 3L O2 applied and SaO2 92% during ambulation, verbal cues for safe use of RW and stopping to take breathing rest breaks Gait Pattern: Step-through pattern;Decreased stride length General Gait Details: increased toe out on L and decreased knee and hip flexion as pt reports increased pain in L flank/abdomen area (around drain site)    Exercises     PT Diagnosis: Difficulty walking;Acute pain  PT Problem List: Decreased strength;Decreased activity tolerance;Decreased knowledge of use of DME;Decreased mobility;Cardiopulmonary status limiting activity;Pain PT Treatment Interventions: DME instruction;Gait training;Functional mobility training;Patient/family education;Therapeutic activities;Therapeutic exercise   PT Goals Acute Rehab PT Goals PT Goal Formulation: With patient Time For Goal Achievement: 06/11/12 Potential to Achieve Goals: Good Pt will go Supine/Side to Sit: with modified independence PT Goal: Supine/Side to Sit - Progress: Goal set today Pt will go Sit to Stand: with modified independence PT Goal: Sit to Stand - Progress: Goal set today Pt will go Stand to Sit: with modified independence PT Goal: Stand to Sit - Progress: Goal set today Pt will Ambulate: 51 - 150 feet;with least restrictive assistive device;with modified independence PT Goal: Ambulate - Progress: Goal set today Pt will Perform Home Exercise Program: with supervision, verbal cues required/provided PT Goal: Perform Home Exercise Program - Progress: Goal set today  Visit Information  Last PT Received On:  05/28/12 Assistance Needed: +1    Subjective Data  Subjective: I've never had that.  (PT)   Prior Functioning  Home Living Lives With: Other (Comment) (nephew lives with her) Type of Home: House Home Access: Level entry Home Layout: One level Home Adaptive Equipment: Straight cane Prior Function Level of Independence: Independent Communication Communication: No difficulties    Cognition  Cognition Overall Cognitive Status: Appears within functional limits for tasks assessed/performed Arousal/Alertness: Awake/alert Orientation Level: Appears intact for tasks assessed Behavior During Session: Flat affect    Extremity/Trunk Assessment Right Lower Extremity Assessment RLE ROM/Strength/Tone: [facility] for tasks assessed Left Lower Extremity Assessment LLE ROM/Strength/Tone: WFL for tasks assessed   Balance    End of Session PT - End of Session Equipment Utilized During Treatment: Oxygen Activity Tolerance: Patient tolerated treatment well Patient left: in chair;with call bell/phone within reach Nurse Communication: Patient requests pain meds  GP     Knowledge [name],[name] 05/28/2012, 1:04 PM Zenovia Jarred, PT, DPT 05/28/2012 Pager: [phone]

## 2012-05-28 NOTE — Progress Notes (Signed)
[facility] PROGRESS NOTE  [name] WUJ:[id number] DOB: 13-Nov-1940 DOA: 05/25/2012 PCP: [name], MD  Assessment/Plan: 1. Perforated Gangrenous appendicitis: ex lap, lysis of adhesions, appendectomy, small bowel resection 4/10 -POD #3 -NGT, IVF, NPO -per CCS -DVT prophylaxis  2. COPD: stable, albuterol nebs PRN -incentive spirometry Q1H  3. H/o grade 1 diastolic dysfunction/mild AS/AI -stable, IVF at 75cc/hr -monitor volume status closely  4. HTN: -IV hydralazine and metoprolol while NPO  5. Tachycardia:improved -post op pain and Beta blocker withdrawal -takes ateonolol at home, -continue scheduled IV lopressor  5. H/o lung CA -s/p left upper lobe lobectomy in 2010  6. CKD: -baseline creatinine 1.4-1.6 -stable, monitor  DVT proph: start lovenox   TX to tele if ok with CCS  Consultants:  [facility]  [facility]  [facility]  Procedures: PROCEDURE: Laparoscopy, exploratory laparotomy, lysis of adhesions, appendectomy, small bowel resection 4/10   Antibiotics:  Ertapenem  HPI/Subjective: C/o some abd soreness, breathing ok, no flatus  Objective: Filed Vitals:   05/28/12 0733 05/28/12 0944 05/28/12 1252 05/28/12 1300  BP:  139/48 136/53 122/41  Pulse:  99 94 87  Temp:    98.6 F (37 C)  TempSrc:    Oral  Resp:      Height:      Weight:      SpO2: 98%   100%    Intake/Output Summary (Last 24 hours) at 05/28/12 1637 Last data filed at 05/28/12 1616  Gross per 24 hour  Intake 1112.5 ml  Output   1550 ml  Net -437.5 ml   Filed Weights   05/25/12 1002  Weight: 74.2 kg (163 lb 9.3 oz)    Exam:   General:  AAOx3  Cardiovascular: S1S2/RRR  Respiratory: decreased BS at bases  Abdomen: soft, distended, tenderness as expected, dressing, BS absent  Ext: no edema c/c  Neuro: moves all extremities   Data Reviewed: Basic Metabolic Panel:  Recent Labs Lab 05/25/12 0250 05/25/12 1518 05/26/12 0343 05/27/12 0350  05/28/12 0601  NA 137 138 135 138 139  K 4.6 3.7 4.4 4.1 4.3  CL 97  --  102 103 104  CO2 26  --  24 24 21   GLUCOSE 108* 121* 147* 102* 71  BUN 29*  --  16 22 23   CREATININE 1.50*  --  1.11* 1.58* 1.27*  CALCIUM 9.1  --  8.0* 8.0* 8.6   Liver Function Tests:  Recent Labs Lab 05/25/12 0250  AST 14  ALT 11  ALKPHOS 52  BILITOT 0.4  PROT 8.1  ALBUMIN 4.0    Recent Labs Lab 05/25/12 0250  LIPASE 27   No results found for this basename: AMMONIA,  in the last 168 hours CBC:  Recent Labs Lab 05/25/12 0250 05/25/12 1500 05/25/12 1518 05/26/12 0343 05/27/12 0350 05/28/12 0601  WBC 8.6 10.5  --  10.2 11.2* 11.2*  NEUTROABS 6.8  --   --   --   --   --   HGB 13.3 9.8* 12.6 10.2* 8.3* 8.7*  HCT 40.2 28.8* 37.0 30.6* 25.4* 26.4*  MCV 99.0 98.3  --  99.0 101.2* 100.4*  PLT 204 165  --  166 154 165   Cardiac Enzymes: No results found for this basename: CKTOTAL, CKMB, CKMBINDEX, TROPONINI,  in the last 168 hours BNP (last 3 results) No results found for this basename: PROBNP,  in the last 8760 hours CBG: No results found for this basename: GLUCAP,  in the last 168 hours  Recent  Results (from the past 240 hour(s))  SURGICAL PCR SCREEN     Status: None   Collection Time    05/25/12 10:52 AM      Result Value Range Status   MRSA, PCR NEGATIVE  NEGATIVE Final   Staphylococcus aureus NEGATIVE  NEGATIVE Final   Comment:            The Xpert SA Assay (FDA     approved for NASAL specimens     in patients over 72 years of age),     is one component of     a comprehensive surveillance     program.  Test performance has     been validated by [employer] for patients greater     than or equal to 77 year old.     It is not intended     to diagnose infection nor to     guide or monitor treatment.     Studies: No results found.  Scheduled Meds: . enoxaparin (LOVENOX) injection  40 mg Subcutaneous Q24H  . ertapenem (INVANZ) IV  1 g Intravenous Q24H  . hydrALAZINE   5 mg Intravenous Q6H  . ipratropium  0.5 mg Nebulization QID  . levalbuterol  0.63 mg Nebulization QID  . metoprolol  5 mg Intravenous Q4H  . pantoprazole (PROTONIX) IV  40 mg Intravenous Q24H   Continuous Infusions: . sodium chloride 75 mL/hr at 05/28/12 0556    Principal Problem:   Acute perforated appendicitis with generalized peritonitis Active Problems:   HYPERLIPIDEMIA   AORTIC STENOSIS/ INSUFFICIENCY, NON-RHEUMATIC   C O P D   Stricture and stenosis of esophagus   Heart palpitations   CKD (chronic kidney disease)   Lung cancer   COPD, moderate    Time spent:    [facility]  [facility] Pager [phone]. If 7PM-7AM, please contact night-coverage at [email or web address], password [facility] 05/28/2012, 4:37 PM  LOS: 3 days

## 2012-05-29 DIAGNOSIS — I10 Essential (primary) hypertension: Secondary | ICD-10-CM

## 2012-05-29 LAB — BASIC METABOLIC PANEL WITH GFR
BUN: 15 mg/dL (ref 6–23)
CO2: 26 meq/L (ref 19–32)
Calcium: 8.6 mg/dL (ref 8.4–10.5)
Chloride: 104 meq/L (ref 96–112)
Creatinine, Ser: 0.95 mg/dL (ref 0.50–1.10)
GFR calc Af Amer: 68 mL/min — ABNORMAL LOW (ref >90)
GFR calc non Af Amer: 59 mL/min — ABNORMAL LOW (ref >90)
Glucose, Bld: 115 mg/dL — ABNORMAL HIGH (ref 70–99)
Potassium: 4.1 meq/L (ref 3.5–5.1)
Sodium: 139 meq/L (ref 135–145)

## 2012-05-29 LAB — MAGNESIUM: Magnesium: 1.8 mg/dL (ref 1.5–2.5)

## 2012-05-29 MED ORDER — HYDRALAZINE HCL 20 MG/ML IJ SOLN: 5.0000 mg | Freq: Four times a day (QID) | INTRAMUSCULAR | Status: DC | PRN

## 2012-05-29 NOTE — Clinical Social Work Placement (Signed)
     Clinical Social Work Department CLINICAL SOCIAL WORK PLACEMENT NOTE 05/29/2012  Patient:  [name], [name]  Account Number:  [id number] Admit date:  05/25/2012  Clinical Social Worker:  Orpah Greek  Date/time:  05/29/2012 04:05 PM  Clinical Social Work is seeking post-discharge placement for this patient at the following level of care:   SKILLED NURSING   (*CSW will update this form in Epic as items are completed)   05/29/2012  Patient/family provided with [facility] Department of Clinical Social Works list of facilities offering this level of care within the geographic area requested by the patient (or if unable, by the patients family).  05/29/2012  Patient/family informed of their freedom to choose among providers that offer the needed level of care, that participate in Medicare, Medicaid or managed care program needed by the patient, have an available bed and are willing to accept the patient.  05/29/2012  Patient/family informed of [facility] ownership interest in [facility], as well as of the fact that they are under no obligation to receive care at this facility.  PASARR submitted to EDS on 05/29/2012 PASARR number received from EDS on 05/29/2012  FL2 transmitted to all facilities in geographic area requested by pt/family on  05/29/2012 FL2 transmitted to all facilities within larger geographic area on   Patient informed that his/her managed care company has contracts with or will negotiate with  certain facilities, including the following:   [employer] Medicare     Patient/family informed of bed offers received:  05/29/2012 Patient chooses bed at [facility] Physician recommends and patient chooses bed at    Patient to be transferred to  on   Patient to be transferred to facility by   The following physician request were entered in Epic:   Additional Comments:

## 2012-05-29 NOTE — Progress Notes (Signed)
Pt needing two person assist to turn in bed. Very pleasant. Dinner tray set up, pt continues clears without nausea. Pt did void a small amount. Grandson visited and verbalizes he thinks rehab would be a good idea, as he has to work and pt would be alone for long periods. Pt will also need assistance managing lower abd incisional wound dressing changes.

## 2012-05-29 NOTE — Progress Notes (Signed)
[facility] PROGRESS NOTE  [name] VHQ:[id number] DOB: 07-13-1940 DOA: 05/25/2012 PCP: [name], MD  Assessment/Plan: 1. Perforated Gangrenous appendicitis: ex lap, lysis of adhesions, appendectomy, small bowel resection 4/10 -POD #4 -NGT removed 4/13, cut down IVF, started on clears -per [facility] -?Stop IV Ertapenem, defer to [facility] -DVT prophylaxis  2. COPD: stable, albuterol nebs PRN -incentive spirometry Q1H  3. H/o grade 1 diastolic dysfunction/mild AS/AI -stable, cutdown IVF to [facility], 3.7L positive so far -monitor volume status closely  4. HTN: -IV hydralazine and metoprolol for now -transition to PO if tolerating liquids better  5. Tachycardia:improved -post op pain and Beta blocker withdrawal -takes ateonolol at home, -continue scheduled IV lopressor  5. H/o lung CA -s/p left upper lobe lobectomy in 2010  6. CKD: -baseline creatinine 1.4-1.6 -stable, monitor  DVT proph:lovenox   PT/OT OOB  Code Status: DNR/DNI  Family Communication: None at bedside Disposition Plan: SNF when stable    Consultants:  [facility]  [facility]  [facility]  Procedures: PROCEDURE: Laparoscopy, exploratory laparotomy, lysis of adhesions, appendectomy, small bowel resection 4/10   Antibiotics:  Ertapenem 4/10  HPI/Subjective: C/o some abd soreness, breathing ok, passing some gas yesterday  Objective: Filed Vitals:   05/28/12 2035 05/28/12 2208 05/29/12 0531 05/29/12 0632  BP: 147/49 138/45 142/45 149/57  Pulse: 93 102  102  Temp:  98.8 F (37.1 C)  98.2 F (36.8 C)  TempSrc:  Oral  Oral  Resp:  20  18  Height:      Weight:      SpO2:  100%  100%    Intake/Output Summary (Last 24 hours) at 05/29/12 0748 Last data filed at 05/29/12 0636  Gross per 24 hour  Intake 1783.75 ml  Output   2320 ml  Net -536.25 ml   Filed Weights   05/25/12 1002  Weight: 74.2 kg (163 lb 9.3 oz)    Exam:   General:  AAOx3  Cardiovascular:  S1S2/RRR  Respiratory: decreased BS at bases  Abdomen: soft, distended, tenderness as expected, dressing, BS present but diminished  Ext: no edema c/c  Neuro: moves all extremities   Data Reviewed: Basic Metabolic Panel:  Recent Labs Lab 05/25/12 0250 05/25/12 1518 05/26/12 0343 05/27/12 0350 05/28/12 0601  NA 137 138 135 138 139  K 4.6 3.7 4.4 4.1 4.3  CL 97  --  102 103 104  CO2 26  --  24 24 21   GLUCOSE 108* 121* 147* 102* 71  BUN 29*  --  16 22 23   CREATININE 1.50*  --  1.11* 1.58* 1.27*  CALCIUM 9.1  --  8.0* 8.0* 8.6   Liver Function Tests:  Recent Labs Lab 05/25/12 0250  AST 14  ALT 11  ALKPHOS 52  BILITOT 0.4  PROT 8.1  ALBUMIN 4.0    Recent Labs Lab 05/25/12 0250  LIPASE 27   No results found for this basename: AMMONIA,  in the last 168 hours CBC:  Recent Labs Lab 05/25/12 0250 05/25/12 1500 05/25/12 1518 05/26/12 0343 05/27/12 0350 05/28/12 0601  WBC 8.6 10.5  --  10.2 11.2* 11.2*  NEUTROABS 6.8  --   --   --   --   --   HGB 13.3 9.8* 12.6 10.2* 8.3* 8.7*  HCT 40.2 28.8* 37.0 30.6* 25.4* 26.4*  MCV 99.0 98.3  --  99.0 101.2* 100.4*  PLT 204 165  --  166 154 165   Cardiac Enzymes: No results found for this basename: CKTOTAL,  CKMB, CKMBINDEX, TROPONINI,  in the last 168 hours BNP (last 3 results) No results found for this basename: PROBNP,  in the last 8760 hours CBG: No results found for this basename: GLUCAP,  in the last 168 hours  Recent Results (from the past 240 hour(s))  SURGICAL PCR SCREEN     Status: None   Collection Time    05/25/12 10:52 AM      Result Value Range Status   MRSA, PCR NEGATIVE  NEGATIVE Final   Staphylococcus aureus NEGATIVE  NEGATIVE Final   Comment:            The Xpert SA Assay (FDA     approved for NASAL specimens     in patients over 30 years of age),     is one component of     a comprehensive surveillance     program.  Test performance has     been validated by [employer] for  patients greater     than or equal to 57 year old.     It is not intended     to diagnose infection nor to     guide or monitor treatment.     Studies: No results found.  Scheduled Meds: . enoxaparin (LOVENOX) injection  40 mg Subcutaneous Q24H  . ertapenem (INVANZ) IV  1 g Intravenous Q24H  . hydrALAZINE  5 mg Intravenous Q6H  . ipratropium  0.5 mg Nebulization TID  . levalbuterol  0.63 mg Nebulization TID  . metoprolol  5 mg Intravenous Q4H  . pantoprazole (PROTONIX) IV  40 mg Intravenous Q24H   Continuous Infusions: . sodium chloride 75 mL/hr at 05/29/12 0603    Principal Problem:   Acute perforated appendicitis with generalized peritonitis Active Problems:   HYPERLIPIDEMIA   AORTIC STENOSIS/ INSUFFICIENCY, NON-RHEUMATIC   C O P D   Stricture and stenosis of esophagus   Heart palpitations   CKD (chronic kidney disease)   Lung cancer   COPD, moderate    Time spent:    [facility]  [facility] Pager [phone]. If 7PM-7AM, please contact night-coverage at [email or web address], password [facility] 05/29/2012, 7:48 AM  LOS: 4 days

## 2012-05-29 NOTE — Clinical Social Work Psychosocial (Signed)
     Clinical Social Work Department BRIEF PSYCHOSOCIAL ASSESSMENT 05/29/2012  Patient:  [name], [name]     Account Number:  [id number]     Admit date:  05/25/2012  Clinical Social Worker:  Orpah Greek  Date/Time:  05/29/2012 04:01 PM  Referred by:  Physician  Date Referred:  05/29/2012 Referred for  SNF Placement   Other Referral:   Interview type:  Patient Other interview type:    PSYCHOSOCIAL DATA Living Status:  FAMILY Admitted from facility:   Level of care:   Primary support name:  Rosezella [place] (daughter) ph#: [phone] Primary support relationship to patient:  CHILD, ADULT Degree of support available:   good    CURRENT CONCERNS Current Concerns  Post-Acute Placement   Other Concerns:    SOCIAL WORK ASSESSMENT / PLAN CSW spoke with patient re: discharge planning. Note PT recommended SNF at discharge. Patient states that she has been to [facility] for rehab in the past and would prefer to go there at discharge.   Assessment/plan status:  Information/Referral to [facility] Other assessment/ plan:   Information/referral to community resources:   CSW completed FL2 and faxed information out to [facility] - provided bed offers & confirmed with [facility] that they would be able to offer a bed. CSW faxed clinical information to [facility] [employer] for prior authorization.    PATIENTS/FAMILYS RESPONSE TO PLAN OF CARE: Patient is agreeable to SNF at discharge [facility]), awaiting insurance approval.

## 2012-05-29 NOTE — Progress Notes (Signed)
Physical Therapy Treatment Patient Details Name: [name] MRN: [id number] DOB: [birth year] Today's Date: 05/29/2012 Time: 4098-1191 PT Time Calculation (min): 26 min  PT Assessment / Plan / Recommendation Comments on Treatment Session  Pt required increased assistance today for bed mobility, decreased gait distance today compared to yesterday (80' yesterday, 6' today).  Distance limited by fatigue, and increased HR of 133.     Follow Up Recommendations  Supervision/Assistance - 24 hour;SNF     Does the patient have the potential to tolerate intense rehabilitation     Barriers to Discharge        Equipment Recommendations  Rolling walker with 5" wheels    Recommendations for Other Services    Frequency Min 3X/week   Plan Discharge plan remains appropriate    Precautions / Restrictions Precautions Precautions: Fall Precaution Comments: monitor sats, L abdominal drain Restrictions Weight Bearing Restrictions: No   Pertinent Vitals/Pain **HR 104 at rest, 133 with walking 6' SaO2 85% on RA at rest, 92% on 2L O2 with walking, 100% on 2L at rest Pt reports B calf pain and R shoulder pain -RN notified*    Mobility  Bed Mobility Bed Mobility: Supine to Sit Supine to Sit: 2: Max assist Details for Bed Mobility Assistance: assist for elevating trunk Transfers Transfers: Sit to Stand;Stand to Sit Sit to Stand: With upper extremity assist;From bed;3: Mod assist Stand to Sit: 4: Min assist;With upper extremity assist;To chair/3-in-1 Details for Transfer Assistance: verbal cues for safe technique, assist to rise and control descent Ambulation/Gait Ambulation/Gait Assistance: 4: Min assist Ambulation Distance (Feet): 6 Feet Assistive device: Rolling walker Ambulation/Gait Assistance Details: assist to advance RW, VCs for positioning in RW, SaO2 92% on 2L O2 walking, HR up to 133 walking: SaO2 100% on 2L O2 and HR 104 after 2 min rest Gait Pattern: Decreased stride  length;Decreased step length - right;Decreased step length - left;Step-to pattern Gait velocity: decreased General Gait Details: distance limited by fatigue, and increased HR of 133    Exercises     PT Diagnosis:    PT Problem List:   PT Treatment Interventions:     PT Goals Acute Rehab PT Goals PT Goal Formulation: With patient Time For Goal Achievement: 06/11/12 Potential to Achieve Goals: Good Pt will go Supine/Side to Sit: with modified independence PT Goal: Supine/Side to Sit - Progress: Not progressing Pt will go Sit to Stand: with modified independence PT Goal: Sit to Stand - Progress: Not progressing Pt will go Stand to Sit: with modified independence PT Goal: Stand to Sit - Progress: Progressing toward goal Pt will Ambulate: 51 - 150 feet;with least restrictive assistive device;with modified independence PT Goal: Ambulate - Progress: Not progressing Pt will Perform Home Exercise Program: with supervision, verbal cues required/provided  Visit Information  Last PT Received On: 05/29/12 Assistance Needed: +2 (for O2, safety)    Subjective Data  Subjective: My R arm is giving me a fit. Both calves are sore too.  -RN notified Patient Stated Goal: to be able to walk   Cognition  Cognition Overall Cognitive Status: Appears within functional limits for tasks assessed/performed Arousal/Alertness: Awake/alert Orientation Level: Appears intact for tasks assessed Behavior During Session: [facility] for tasks performed    Balance  Balance Balance Assessed: Yes Static Sitting Balance Static Sitting - Balance Support: Bilateral upper extremity supported;Feet supported Static Sitting - Level of Assistance: 5: Stand by assistance Static Sitting - Comment/# of Minutes: 3  End of Session PT - End of Session  Equipment Utilized During Treatment: Oxygen Activity Tolerance: Patient limited by fatigue Patient left: in chair;with call bell/phone within reach;with nursing in room Nurse  Communication: Patient requests pain meds;Mobility status   GP     Ralene Bathe Kistler 05/29/2012, 10:14 AM [phone]

## 2012-05-29 NOTE — Progress Notes (Signed)
Patient ID: [name], female   DOB: Apr 22, 1940, 72 y.o.   MRN: [id number] [facility] Progress Note:   4 Days Post-Op  Subjective: Mental status is clear Objective: Vital signs in last 24 hours: Temp:  [97.7 F (36.5 C)-98.8 F (37.1 C)] 98.2 F (36.8 C) (04/14 4098) Pulse Rate:  [87-133] 133 (04/14 0952) Resp:  [18-20] 18 (04/14 1191) BP: (122-149)/(41-57) 149/57 mmHg (04/14 4782) SpO2:  [85 %-100 %] 95 % (04/14 0953)  Intake/Output from previous day: 04/13 0701 - 04/14 0700 In: 1783.8 [I.V.:1733.8; IV Piggyback:50] Out: 2320 [Urine:2300; Drains:20] Intake/Output this shift: Total I/O In: 240 [P.O.:240] Out: 10 [Drains:10]  Physical Exam: Work of breathing is normal.  Daily dressing changes  Lab Results:  Results for orders placed during the hospital encounter of 05/25/12 (from the past 48 hour(s))  CBC     Status: Abnormal   Collection Time    05/28/12  6:01 AM      Result Value Range   WBC 11.2 (*) 4.0 - 10.5 K/uL   RBC 2.63 (*) 3.87 - 5.11 MIL/uL   Hemoglobin 8.7 (*) 12.0 - 15.0 g/dL   HCT 95.6 (*) 21.3 - 08.6 %   MCV 100.4 (*) 78.0 - 100.0 fL   MCH 33.1  26.0 - 34.0 pg   MCHC 33.0  30.0 - 36.0 g/dL   RDW 57.8  46.9 - 62.9 %   Platelets 165  150 - 400 K/uL  BASIC METABOLIC PANEL     Status: Abnormal   Collection Time    05/28/12  6:01 AM      Result Value Range   Sodium 139  135 - 145 mEq/L   Potassium 4.3  3.5 - 5.1 mEq/L   Chloride 104  96 - 112 mEq/L   CO2 21  19 - 32 mEq/L   Glucose, Bld 71  70 - 99 mg/dL   BUN 23  6 - 23 mg/dL   Creatinine, Ser 5.28 (*) 0.50 - 1.10 mg/dL   Calcium 8.6  8.4 - 41.3 mg/dL   GFR calc non Af Amer 41 (*) >90 mL/min   GFR calc Af Amer 48 (*) >90 mL/min   Comment:            The eGFR has been calculated     using the CKD EPI equation.     This calculation has not been     validated in all clinical     situations.     eGFR's persistently     <90 mL/min signify     possible Chronic Kidney Disease.   MAGNESIUM     Status: None   Collection Time    05/29/12  8:14 AM      Result Value Range   Magnesium 1.8  1.5 - 2.5 mg/dL  BASIC METABOLIC PANEL     Status: Abnormal   Collection Time    05/29/12  8:14 AM      Result Value Range   Sodium 139  135 - 145 mEq/L   Potassium 4.1  3.5 - 5.1 mEq/L   Chloride 104  96 - 112 mEq/L   CO2 26  19 - 32 mEq/L   Glucose, Bld 115 (*) 70 - 99 mg/dL   BUN 15  6 - 23 mg/dL   Creatinine, Ser 2.44  0.50 - 1.10 mg/dL   Calcium 8.6  8.4 - 01.0 mg/dL   GFR calc non Af Amer 59 (*) >90 mL/min   GFR  calc Af Amer 68 (*) >90 mL/min   Comment:            The eGFR has been calculated     using the CKD EPI equation.     This calculation has not been     validated in all clinical     situations.     eGFR's persistently     <90 mL/min signify     possible Chronic Kidney Disease.    Radiology/Results: No results found.  Anti-infectives: Anti-infectives   Start     Dose/Rate Route Frequency Ordered Stop   05/26/12 0600  ertapenem (INVANZ) 1 g in sodium chloride 0.9 % 50 mL IVPB  Status:  Discontinued     1 g 100 mL/hr over 30 Minutes Intravenous Every 24 hours 05/25/12 0733 05/25/12 1730   05/26/12 0600  ertapenem (INVANZ) 1 g in sodium chloride 0.9 % 50 mL IVPB     1 g 100 mL/hr over 30 Minutes Intravenous Every 24 hours 05/25/12 1729     05/25/12 0545  ertapenem (INVANZ) 1 g in sodium chloride 0.9 % 50 mL IVPB     1 g 100 mL/hr over 30 Minutes Intravenous  Once 05/25/12 0540 05/25/12 0716      Assessment/Plan: Problem List: Patient Active Problem List  Diagnosis  . CARCINOMA, LUNG, SQUAMOUS CELL  . HYPERLIPIDEMIA  . ANEMIA-IRON DEFICIENCY  . ANXIETY  . DEPRESSION  . HYPERTENSION  . PERICARDITIS  . AORTIC STENOSIS/ INSUFFICIENCY, NON-RHEUMATIC  . SINUSITIS, CHRONIC  . ALLERGIC RHINITIS  . C O P D  . Stricture and stenosis of esophagus  . BARRETTS ESOPHAGUS  . DYSPEPSIA  . OSTEOARTHRITIS  . OSTEOARTHRITIS, KNEE, LEFT  . SPONDYLOSIS,  CERVICAL, WITH RADICULOPATHY  . BACK PAIN  . BURSITIS, LEFT HIP  . OSTEOPOROSIS  . INSOMNIA-SLEEP DISORDER-UNSPEC  . FATIGUE  . PERIPHERAL EDEMA  . EPISTAXIS, RECURRENT  . MURMUR  . Tuberculin Test Reaction  . CHOLELITHIASIS  . NEPHROLITHIASIS, HX OF  . Preventative health care  . Polyarthralgia  . Elevated sed rate  . Positive ANA (antinuclear antibody)  . Rheumatoid factor positive  . Cervical radiculopathy  . Lumbar radiculopathy  . Pre-operative cardiovascular examination  . Cervical cord compression with myelopathy  . Heart palpitations  . Palpitations  . Non-sustained ventricular tachycardia  . Mitral regurgitation  . Hoarseness  . Dysphagia  . CKD (chronic kidney disease)  . Hand cramps  . Lung cancer  . Plantar fasciitis of left foot  . COPD, moderate  . Acute perforated appendicitis with generalized peritonitis    Will discontinue Foley.  Advance diet 4 Days Post-Op    LOS: 4 days   Matt B. Daphine Deutscher, MD, [facility] [phone] beeper [phone]  05/29/2012 12:35 PM

## 2012-05-30 LAB — BASIC METABOLIC PANEL
BUN: 12 mg/dL (ref 6–23)
CO2: 27 mEq/L (ref 19–32)
GFR calc non Af Amer: 55 mL/min — ABNORMAL LOW (ref >90)
Glucose, Bld: 91 mg/dL (ref 70–99)
Potassium: 3.8 mEq/L (ref 3.5–5.1)

## 2012-05-30 LAB — CBC
HCT: 21.8 % — ABNORMAL LOW (ref 36.0–46.0)
Hemoglobin: 7.5 g/dL — ABNORMAL LOW (ref 12.0–15.0)
MCH: 34.2 pg — ABNORMAL HIGH (ref 26.0–34.0)
MCHC: 34.4 g/dL (ref 30.0–36.0)

## 2012-05-30 MED ORDER — ACETAMINOPHEN 325 MG PO TABS
650.0000 mg | ORAL_TABLET | Freq: Four times a day (QID) | ORAL | Status: DC | PRN
  Administered 2012-05-31 – 2012-06-04 (×2): 650 mg via ORAL
  Filled 2012-05-30 (×2): qty 2

## 2012-05-30 MED ORDER — FUROSEMIDE 40 MG PO TABS
40.0000 mg | ORAL_TABLET | Freq: Every day | ORAL | Status: DC
  Administered 2012-05-30 – 2012-06-01 (×3): 40 mg via ORAL
  Filled 2012-05-30 (×3): qty 1

## 2012-05-30 MED ORDER — TRAMADOL HCL 50 MG PO TABS
50.0000 mg | ORAL_TABLET | Freq: Four times a day (QID) | ORAL | Status: DC | PRN
  Administered 2012-05-31 – 2012-06-02 (×3): 50 mg via ORAL
  Filled 2012-05-30 (×3): qty 1

## 2012-05-30 MED ORDER — METOPROLOL SUCCINATE ER 25 MG PO TB24
25.0000 mg | ORAL_TABLET | Freq: Every day | ORAL | Status: DC
  Administered 2012-05-30 – 2012-06-06 (×8): 25 mg via ORAL
  Filled 2012-05-30 (×8): qty 1

## 2012-05-30 MED ORDER — PANTOPRAZOLE SODIUM 40 MG PO TBEC
40.0000 mg | DELAYED_RELEASE_TABLET | Freq: Every day | ORAL | Status: DC
  Administered 2012-05-30 – 2012-06-06 (×8): 40 mg via ORAL
  Filled 2012-05-30 (×8): qty 1

## 2012-05-30 NOTE — Progress Notes (Signed)
5 Days Post-Op  Subjective: Pt feeling better.  Still c/o pain in abdomen, but improved.  Slightly nauseated but not vomiting.  Tolerating clear liquids well.  Ambulating with PT.    Objective: Vital signs in last 24 hours: Temp:  [98.1 F (36.7 C)-99.2 F (37.3 C)] 98.9 F (37.2 C) (04/15 0512) Pulse Rate:  [77-97] 84 (04/15 0512) Resp:  [16-18] 16 (04/15 0512) BP: (133-150)/(50-57) 150/57 mmHg (04/15 0512) SpO2:  [95 %-100 %] 99 % (04/15 1400) Last BM Date: 05/30/12  Intake/Output from previous day: 04/14 0701 - 04/15 0700 In: 1330.8 [P.O.:700; I.V.:580.8; IV Piggyback:50] Out: 440 [Urine:400; Drains:40] Intake/Output this shift: Total I/O In: 0  Out: 21 [Drains:20; Stool:1]  PE: Gen:  Alert, NAD, pleasant Abd: Soft, mild tenderness in lower abdomen, ND, +BS, no HSM, lower abdominal wound C/D/I, drain with minimal sanguinous drainage   Lab Results:   Recent Labs  05/28/12 0601 05/30/12 0454  WBC 11.2* 8.8  HGB 8.7* 7.5*  HCT 26.4* 21.8*  PLT 165 205   BMET  Recent Labs  05/29/12 0814 05/30/12 0454  NA 139 138  K 4.1 3.8  CL 104 103  CO2 26 27  GLUCOSE 115* 91  BUN 15 12  CREATININE 0.95 1.00  CALCIUM 8.6 8.2*   PT/INR No results found for this basename: LABPROT, INR,  in the last 72 hours CMP     Component Value Date/Time   NA 138 05/30/2012 0454   NA 147* 09/20/2011 0817   K 3.8 05/30/2012 0454   K 4.2 09/20/2011 0817   CL 103 05/30/2012 0454   CL 100 09/20/2011 0817   CO2 27 05/30/2012 0454   CO2 29 09/20/2011 0817   GLUCOSE 91 05/30/2012 0454   GLUCOSE 88 09/20/2011 0817   BUN 12 05/30/2012 0454   BUN 20 09/20/2011 0817   CREATININE 1.00 05/30/2012 0454   CREATININE 1.6* 09/20/2011 0817   CALCIUM 8.2* 05/30/2012 0454   CALCIUM 9.1 09/20/2011 0817   PROT 8.1 05/25/2012 0250   PROT 7.4 09/20/2011 0817   ALBUMIN 4.0 05/25/2012 0250   AST 14 05/25/2012 0250   AST 17 09/20/2011 0817   ALT 11 05/25/2012 0250   ALKPHOS 52 05/25/2012 0250   ALKPHOS 69 09/20/2011 0817   BILITOT 0.4 05/25/2012 0250   BILITOT 0.60 09/20/2011 0817   GFRNONAA 55* 05/30/2012 0454   GFRAA 64* 05/30/2012 0454   Lipase     Component Value Date/Time   LIPASE 27 05/25/2012 0250       Studies/Results: No results found.  Anti-infectives: Anti-infectives   Start     Dose/Rate Route Frequency Ordered Stop   05/26/12 0600  ertapenem (INVANZ) 1 g in sodium chloride 0.9 % 50 mL IVPB  Status:  Discontinued     1 g 100 mL/hr over 30 Minutes Intravenous Every 24 hours 05/25/12 0733 05/25/12 1730   05/26/12 0600  ertapenem (INVANZ) 1 g in sodium chloride 0.9 % 50 mL IVPB     1 g 100 mL/hr over 30 Minutes Intravenous Every 24 hours 05/25/12 1729     05/25/12 0545  ertapenem (INVANZ) 1 g in sodium chloride 0.9 % 50 mL IVPB     1 g 100 mL/hr over 30 Minutes Intravenous  Once 05/25/12 0540 05/25/12 0716       Assessment/Plan POD #5 s/p Laparoscopy, ex lap, LOA, appendectomy, small bowel resection for perforated, gangrenous appendicitis 1.  Advance diet to fulls, oral pain meds 2.  IVF, pain control,  antibiotics (cont. invanz for now) - will discuss with Dr. To see if we can d/c 3.  Ambulation and IS 4.  Continue open wound packing 5.  SCD's and lovenox  ABL Anemia - Hgb drop from 8.7 on 4/13 to today 7.5 HTN H/o grade 1 diastolic dysfunction, mild AS/AI H/o lung cancer CKD B/L Leg weakness/sorenss - not consistent with DVT, more consistent with deconditioning and blood loss   LOS: 5 days    DORT, MEGAN 05/30/2012, 2:21 PM Pager: [phone]

## 2012-05-30 NOTE — Progress Notes (Addendum)
[facility] PROGRESS NOTE  [name] ZOX:[id number] DOB: 1940-03-20 DOA: 05/25/2012 PCP: [name], MD  Brief Narrative: [name] is a 72 y.o. female with h/o COPD, hypertension, hyperlipidemia, chronic kidney disease with a baseline creatinine 1.4-1.6, presents a chief complaint of abdominal pain for the past for 5 days. She was found to have acute cholecystitis, subsequently underwent ex lap, Appy, lysis of adhesions and small bowel resection. Her post operative course was significant for pain, tachycardia, anemia.   Assessment/Plan:  1. Perforated Gangrenous appendicitis: ex lap, lysis of adhesions, appendectomy, small bowel resection 4/10 -POD #5 -NGT removed 4/13, cut down IVF, started on clears -per [facility] -?Stop IV Ertapenem, defer to [facility] -DVT prophylaxis  2. COPD: stable, albuterol nebs PRN -incentive spirometry Q1H  3. H/o grade 1 diastolic dysfunction/mild AS/AI -stable, cutdown IVF to [facility], 3.7L positive so far -resume low dose PO Lasix  4. HTN: -Was on IV hydralazine and metoprolol , change to PO Toprol XL -slowly resume home antihypertensives  5. Tachycardia:improved -post op pain and Beta blocker withdrawal -resume PO Betablocker  5. H/o lung CA -s/p left upper lobe lobectomy in 2010  6. CKD: -baseline creatinine 1.4-1.6 -stable, monitor  7. Anemia: post op/chronic disease and hemodilution -transfuse if <7 or symptomatic -monitor CBC  DVT proph:lovenox   PT/OT OOB  Code Status: DNR/DNI  Family Communication: None at bedside Disposition Plan: SNF when stable    Consultants:  [facility]  [facility]  [facility]  Procedures: PROCEDURE: Laparoscopy, exploratory laparotomy, lysis of adhesions, appendectomy, small bowel resection 4/10   Antibiotics:  Ertapenem 4/10  HPI/Subjective: C/o some abd soreness, breathing ok, passing some gas yesterday  Objective: Filed Vitals:   05/30/12 0114 05/30/12 0512 05/30/12 0830  05/30/12 1400  BP: 137/50 150/57    Pulse:  84    Temp:  98.9 F (37.2 C)    TempSrc:  Oral    Resp:  16    Height:      Weight:      SpO2:  100% 99% 99%    Intake/Output Summary (Last 24 hours) at 05/30/12 1416 Last data filed at 05/30/12 1300  Gross per 24 hour  Intake 1090.75 ml  Output    451 ml  Net 639.75 ml   Filed Weights   05/25/12 1002  Weight: 74.2 kg (163 lb 9.3 oz)    Exam:   General:  AAOx3  Cardiovascular: S1S2/RRR  Respiratory: decreased BS at bases  Abdomen: soft, distended, tenderness as expected, dressing, BS present but diminished  Ext: no edema c/c  Neuro: moves all extremities   Data Reviewed: Basic Metabolic Panel:  Recent Labs Lab 05/26/12 0343 05/27/12 0350 05/28/12 0601 05/29/12 0814 05/30/12 0454  NA 135 138 139 139 138  K 4.4 4.1 4.3 4.1 3.8  CL 102 103 104 104 103  CO2 24 24 21 26 27   GLUCOSE 147* 102* 71 115* 91  BUN 16 22 23 15 12   CREATININE 1.11* 1.58* 1.27* 0.95 1.00  CALCIUM 8.0* 8.0* 8.6 8.6 8.2*  MG  --   --   --  1.8  --    Liver Function Tests:  Recent Labs Lab 05/25/12 0250  AST 14  ALT 11  ALKPHOS 52  BILITOT 0.4  PROT 8.1  ALBUMIN 4.0    Recent Labs Lab 05/25/12 0250  LIPASE 27   No results found for this basename: AMMONIA,  in the last 168 hours CBC:  Recent Labs Lab 05/25/12 0250  05/25/12 1500 05/25/12 1518 05/26/12 0343 05/27/12 0350 05/28/12 0601 05/30/12 0454  WBC 8.6 10.5  --  10.2 11.2* 11.2* 8.8  NEUTROABS 6.8  --   --   --   --   --   --   HGB 13.3 9.8* 12.6 10.2* 8.3* 8.7* 7.5*  HCT 40.2 28.8* 37.0 30.6* 25.4* 26.4* 21.8*  MCV 99.0 98.3  --  99.0 101.2* 100.4* 99.5  PLT 204 165  --  166 154 165 205   Cardiac Enzymes: No results found for this basename: CKTOTAL, CKMB, CKMBINDEX, TROPONINI,  in the last 168 hours BNP (last 3 results) No results found for this basename: PROBNP,  in the last 8760 hours CBG: No results found for this basename: GLUCAP,  in the last 168  hours  Recent Results (from the past 240 hour(s))  SURGICAL PCR SCREEN     Status: None   Collection Time    05/25/12 10:52 AM      Result Value Range Status   MRSA, PCR NEGATIVE  NEGATIVE Final   Staphylococcus aureus NEGATIVE  NEGATIVE Final   Comment:            The Xpert SA Assay (FDA     approved for NASAL specimens     in patients over 48 years of age),     is one component of     a comprehensive surveillance     program.  Test performance has     been validated by [employer] for patients greater     than or equal to 72 year old.     It is not intended     to diagnose infection nor to     guide or monitor treatment.     Studies: No results found.  Scheduled Meds: . enoxaparin (LOVENOX) injection  40 mg Subcutaneous Q24H  . ertapenem (INVANZ) IV  1 g Intravenous Q24H  . furosemide  40 mg Oral Daily  . ipratropium  0.5 mg Nebulization TID  . levalbuterol  0.63 mg Nebulization TID  . metoprolol succinate  25 mg Oral Daily  . pantoprazole  40 mg Oral Daily   Continuous Infusions: . sodium chloride 20 mL/hr at 05/30/12 0146    Principal Problem:   Acute perforated appendicitis with generalized peritonitis Active Problems:   HYPERLIPIDEMIA   AORTIC STENOSIS/ INSUFFICIENCY, NON-RHEUMATIC   C O P D   Stricture and stenosis of esophagus   Heart palpitations   CKD (chronic kidney disease)   Lung cancer   COPD, moderate    Time spent:    [facility]  [facility] Pager [phone]. If 7PM-7AM, please contact night-coverage at [email or web address], password [facility] 05/30/2012, 2:16 PM  LOS: 5 days

## 2012-05-30 NOTE — Progress Notes (Signed)
Pt's HR went up to 180 and sustained between 160-180 for about 2 minutes. BP 105/57. Returned to 58s. MD made aware. Will continue to monitor. Julio Sicks RN

## 2012-05-30 NOTE — Clinical Documentation Improvement (Signed)
CKD DOCUMENTATION CLARIFICATION QUERY   THIS DOCUMENT IS NOT A PERMANENT PART OF THE MEDICAL RECORD  TO RESPOND TO THE THIS QUERY, FOLLOW THE INSTRUCTIONS BELOW:  1. If needed, update documentation for the patient's encounter via the notes activity.  2. Access this query again and click edit on the In [employer].  3. After updating, or not, click F2 to complete all highlighted (required) fields concerning your review. Select "additional documentation in the medical record" OR "no additional documentation provided".  4. Click Sign note button.  5. The deficiency will fall out of your In Basket *Please let us know if you are not able to complete this workflow by phone or e-mail (listed below).  Please update your documentation within the medical record to reflect your response to this query.                                                                                        05/30/12   Dear Dr. [facility],  In a better effort to capture your patient's severity of illness, reflect appropriate length of stay and utilization of resources, a review of the patient medical record has revealed the following indicators.    Based on your clinical judgment, please clarify and document in a progress note and/or discharge summary the clinical condition associated with the following supporting information:  In responding to this query please exercise your independent judgment.  The fact that a query is asked, does not imply that any particular answer is desired or expected.  05/29/12 Noted per H&P and continued throughout progress notes " Chronic Kidney Disease". For accurate Dx specificity & severity please help further specify stage of CKD. Thank you  Possible Clinical Conditions?  _______CKD Stage I -  GFR > OR = 90 _______CKD Stage II - GFR 60-80 _______CKD Stage III - GFR 30-59 _______CKD Stage IV - GFR 15-29 _______CKD Stage V - GFR < 15 _______ESRD (End Stage Renal  Disease) _______Other condition_____________ _______Cannot Clinically determine   Supporting Information: Risk Factors:  Per H&P of "CKD"  Signs & Symptoms:  05/29/12 Progress note..."6. CKD: -baseline creatinine 1.4-1.6 -stable, monitor"...  Diagnostics:  05/29/12 Progress note..."6. CKD: -baseline creatinine 1.4-1.6 -stable, monitor"... Urine: Protein:   Casts:   Treatment: 05/29/12 Progress note..."6. CKD: -baseline creatinine 1.4-1.6 -stable, monitor"...  You may use possible, probable, or suspect with inpatient documentation. possible, probable, suspected diagnoses MUST be documented at the time of discharge  Reviewed:   Thank You, Toribio Harbour, RN, BSN, CCDS Certified Clinical Documentation Specialist Pager: [phone]  [facility] [place]

## 2012-05-31 DIAGNOSIS — F411 Generalized anxiety disorder: Secondary | ICD-10-CM

## 2012-05-31 LAB — IRON AND TIBC
Iron: 67 ug/dL (ref 42–135)
Saturation Ratios: 66 % — ABNORMAL HIGH (ref 20–55)
TIBC: 101 ug/dL — ABNORMAL LOW (ref 250–470)

## 2012-05-31 LAB — BASIC METABOLIC PANEL
BUN: 9 mg/dL (ref 6–23)
Chloride: 103 mEq/L (ref 96–112)
Chloride: 99 mEq/L (ref 96–112)
Creatinine, Ser: 1.01 mg/dL (ref 0.50–1.10)
GFR calc Af Amer: 63 mL/min — ABNORMAL LOW (ref >90)
GFR calc Af Amer: 75 mL/min — ABNORMAL LOW (ref >90)
GFR calc non Af Amer: 65 mL/min — ABNORMAL LOW (ref >90)
Glucose, Bld: 83 mg/dL (ref 70–99)
Potassium: 4 mEq/L (ref 3.5–5.1)

## 2012-05-31 LAB — CBC
HCT: 19.9 % — ABNORMAL LOW (ref 36.0–46.0)
MCH: 32.3 pg (ref 26.0–34.0)
MCHC: 32.7 g/dL (ref 30.0–36.0)
MCV: 99 fL (ref 78.0–100.0)
RDW: 14.1 % (ref 11.5–15.5)

## 2012-05-31 LAB — HEMOGLOBIN AND HEMATOCRIT, BLOOD
HCT: 27.6 % — ABNORMAL LOW (ref 36.0–46.0)
Hemoglobin: 9.4 g/dL — ABNORMAL LOW (ref 12.0–15.0)

## 2012-05-31 LAB — PREPARE RBC (CROSSMATCH)

## 2012-05-31 LAB — OCCULT BLOOD X 1 CARD TO LAB, STOOL: Fecal Occult Bld: NEGATIVE

## 2012-05-31 MED ORDER — OXYCODONE HCL 5 MG PO TABS
5.0000 mg | ORAL_TABLET | Freq: Four times a day (QID) | ORAL | Status: DC | PRN
  Administered 2012-06-01 – 2012-06-06 (×7): 5 mg via ORAL
  Filled 2012-05-31 (×11): qty 1

## 2012-05-31 MED ORDER — HYDROCODONE-ACETAMINOPHEN 5-325 MG PO TABS
2.0000 | ORAL_TABLET | ORAL | Status: DC | PRN
  Administered 2012-05-31 – 2012-06-06 (×14): 2 via ORAL
  Filled 2012-05-31 (×14): qty 2

## 2012-05-31 NOTE — Progress Notes (Signed)
IV team restart of IV, blood restarted; will run blood until 4 hour time limit expires.

## 2012-05-31 NOTE — Progress Notes (Signed)
[facility] PROGRESS NOTE  GERARDINE PELTZ ZOX:[id number] DOB: 1940/04/21 DOA: 05/25/2012 PCP: [name], MD  Brief Narrative: [name] is a 72 y.o. female with h/o COPD, hypertension, hyperlipidemia, chronic kidney disease with a baseline creatinine 1.4-1.6, presents a chief complaint of abdominal pain for the past for 5 days. She was found to have acute cholecystitis, subsequently underwent ex lap, Appy, lysis of adhesions and small bowel resection. Her post operative course was significant for pain, tachycardia, anemia.   Assessment/Plan:  1. Perforated Gangrenous appendicitis: ex lap, lysis of adhesions, appendectomy, small bowel resection 4/10 -POD #6 -NGT removed 4/13, cut down IVF, started on clears -per [facility] -?Stop IV Ertapenem, defer to [facility] -DVT prophylaxis  2. COPD: stable, albuterol nebs PRN -incentive spirometry Q1H  3. H/o grade 1 diastolic dysfunction/mild AS/AI -stable, cutdown IVF to [facility], 3.7L positive so far -resume low dose PO Lasix - CXR to evaluate for pulm edema.  - pro bnp - PT/OT eval   4. HTN: -Was on IV hydralazine and metoprolol , change to PO Toprol XL -slowly resume home antihypertensives  5. Tachycardia:improved -post op pain and Beta blocker withdrawal -resume PO Betablocker  5. H/o lung CA -s/p left upper lobe lobectomy in 2010  6. CKD: -baseline creatinine 1.4-1.6 -stable, monitor  7. Anemia: post op/chronic disease and hemodilution -transfuse if <7 or symptomatic -monitor CBC  DVT proph:lovenox   PT/OT OOB  Code Status: DNR/DNI  Family Communication: daughter at bedside Disposition Plan: SNF when stable    Consultants:  [facility]  [facility]   [facility]  Procedures: PROCEDURE: Laparoscopy, exploratory laparotomy, lysis of adhesions, appendectomy, small bowel resection 4/10   Antibiotics:  Ertapenem 4/10  HPI/Subjective: BM today.   Objective: Filed Vitals:   05/31/12 1300 05/31/12 1325  05/31/12 1425 05/31/12 1455  BP: 131/52  124/50   Pulse: 82  84   Temp: 98.9 F (37.2 C) 99 F (37.2 C) 98.9 F (37.2 C)   TempSrc: Oral Oral Oral   Resp: 18  18   Height:      Weight:      SpO2:    100%    Intake/Output Summary (Last 24 hours) at 05/31/12 1552 Last data filed at 05/31/12 1455  Gross per 24 hour  Intake 1911.25 ml  Output    950 ml  Net 961.25 ml   Filed Weights   05/25/12 1002  Weight: 74.2 kg (163 lb 9.3 oz)    Exam:   General:  AAOx3  Cardiovascular: S1S2/RRR  Respiratory: decreased BS at bases  Abdomen: soft, distended, tenderness as expected, dressing, BS present but diminished  Ext: no edema c/c  Neuro: moves all extremities   Data Reviewed: Basic Metabolic Panel:  Recent Labs Lab 05/27/12 0350 05/28/12 0601 05/29/12 0814 05/30/12 0454 05/31/12 0435  NA 138 139 139 138 138  K 4.1 4.3 4.1 3.8 3.4*  CL 103 104 104 103 103  CO2 24 21 26 27 29   GLUCOSE 102* 71 115* 91 83  BUN 22 23 15 12 9   CREATININE 1.58* 1.27* 0.95 1.00 1.01  CALCIUM 8.0* 8.6 8.6 8.2* 8.1*  MG  --   --  1.8  --   --    Liver Function Tests:  Recent Labs Lab 05/25/12 0250  AST 14  ALT 11  ALKPHOS 52  BILITOT 0.4  PROT 8.1  ALBUMIN 4.0    Recent Labs Lab 05/25/12 0250  LIPASE 27   No results found for this basename:  AMMONIA,  in the last 168 hours CBC:  Recent Labs Lab 05/25/12 0250  05/26/12 0343 05/27/12 0350 05/28/12 0601 05/30/12 0454 05/31/12 0435  WBC 8.6  < > 10.2 11.2* 11.2* 8.8 7.1  NEUTROABS 6.8  --   --   --   --   --   --   HGB 13.3  < > 10.2* 8.3* 8.7* 7.5* 6.5*  HCT 40.2  < > 30.6* 25.4* 26.4* 21.8* 19.9*  MCV 99.0  < > 99.0 101.2* 100.4* 99.5 99.0  PLT 204  < > 166 154 165 205 206  < > = values in this interval not displayed. Cardiac Enzymes: No results found for this basename: CKTOTAL, CKMB, CKMBINDEX, TROPONINI,  in the last 168 hours BNP (last 3 results) No results found for this basename: PROBNP,  in the last  8760 hours CBG: No results found for this basename: GLUCAP,  in the last 168 hours  Recent Results (from the past 240 hour(s))  SURGICAL PCR SCREEN     Status: None   Collection Time    05/25/12 10:52 AM      Result Value Range Status   MRSA, PCR NEGATIVE  NEGATIVE Final   Staphylococcus aureus NEGATIVE  NEGATIVE Final   Comment:            The Xpert SA Assay (FDA     approved for NASAL specimens     in patients over 63 years of age),     is one component of     a comprehensive surveillance     program.  Test performance has     been validated by [employer] for patients greater     than or equal to 30 year old.     It is not intended     to diagnose infection nor to     guide or monitor treatment.     Studies: No results found.  Scheduled Meds: . enoxaparin (LOVENOX) injection  40 mg Subcutaneous Q24H  . furosemide  40 mg Oral Daily  . ipratropium  0.5 mg Nebulization TID  . levalbuterol  0.63 mg Nebulization TID  . metoprolol succinate  25 mg Oral Daily  . pantoprazole  40 mg Oral Daily   Continuous Infusions: . sodium chloride 20 mL/hr at 05/30/12 0146    Principal Problem:   Acute perforated appendicitis with generalized peritonitis Active Problems:   HYPERLIPIDEMIA   AORTIC STENOSIS/ INSUFFICIENCY, NON-RHEUMATIC   C O P D   Stricture and stenosis of esophagus   Heart palpitations   CKD (chronic kidney disease)   Lung cancer   COPD, moderate    Time spent:    [facility]  [facility] Pager [phone]. If 7PM-7AM, please contact night-coverage at [email or web address], password [facility] 05/31/2012, 3:52 PM  LOS: 6 days

## 2012-05-31 NOTE — Progress Notes (Signed)
Blood started in PIV that was infusing at [facility], within a couple of minutes pt verbalized pain at site, slight swelling noted; blood stopped and IV team paged. Writer attempt to restart IV x 2 without success. Pt tolerated well.

## 2012-05-31 NOTE — Progress Notes (Signed)
6 Days Post-Op  Subjective: Pt doing pretty well, tolerating fulls.  Pt ambulating a little 2* SOB/weakness from anemia.  Pt's pain controlled with orals.  Pt having BM's and passing gas.    Objective: Vital signs in last 24 hours: Temp:  [98.6 F (37 C)-99.5 F (37.5 C)] 99.2 F (37.3 C) (04/16 1045) Pulse Rate:  [81-94] 84 (04/16 1045) Resp:  [16-18] 16 (04/16 1045) BP: (107-145)/(39-52) 117/46 mmHg (04/16 1045) SpO2:  [99 %-100 %] 100 % (04/16 0821) Last BM Date: 05/30/12  Intake/Output from previous day: 04/15 0701 - 04/16 0700 In: 1843.3 [P.O.:1320; I.V.:473.3; IV Piggyback:50] Out: 371 [Urine:350; Drains:20; Stool:1] Intake/Output this shift: Total I/O In: -  Out: 175 [Urine:175]  PE: Gen:  Alert, NAD, pleasant Abd: Soft, mild tenderness at open wound, ND, +BS, no HSM, incisions C/D/I, open wound with good granulation tissue, minimal slough, no foul smelling drainage  Lab Results:   Recent Labs  05/30/12 0454 05/31/12 0435  WBC 8.8 7.1  HGB 7.5* 6.5*  HCT 21.8* 19.9*  PLT 205 206   BMET  Recent Labs  05/30/12 0454 05/31/12 0435  NA 138 138  K 3.8 3.4*  CL 103 103  CO2 27 29  GLUCOSE 91 83  BUN 12 9  CREATININE 1.00 1.01  CALCIUM 8.2* 8.1*   PT/INR No results found for this basename: LABPROT, INR,  in the last 72 hours CMP     Component Value Date/Time   NA 138 05/31/2012 0435   NA 147* 09/20/2011 0817   K 3.4* 05/31/2012 0435   K 4.2 09/20/2011 0817   CL 103 05/31/2012 0435   CL 100 09/20/2011 0817   CO2 29 05/31/2012 0435   CO2 29 09/20/2011 0817   GLUCOSE 83 05/31/2012 0435   GLUCOSE 88 09/20/2011 0817   BUN 9 05/31/2012 0435   BUN 20 09/20/2011 0817   CREATININE 1.01 05/31/2012 0435   CREATININE 1.6* 09/20/2011 0817   CALCIUM 8.1* 05/31/2012 0435   CALCIUM 9.1 09/20/2011 0817   PROT 8.1 05/25/2012 0250   PROT 7.4 09/20/2011 0817   ALBUMIN 4.0 05/25/2012 0250   AST 14 05/25/2012 0250   AST 17 09/20/2011 0817   ALT 11 05/25/2012 0250   ALKPHOS 52 05/25/2012  0250   ALKPHOS 69 09/20/2011 0817   BILITOT 0.4 05/25/2012 0250   BILITOT 0.60 09/20/2011 0817   GFRNONAA 55* 05/31/2012 0435   GFRAA 63* 05/31/2012 0435   Lipase     Component Value Date/Time   LIPASE 27 05/25/2012 0250       Studies/Results: No results found.  Anti-infectives: Anti-infectives   Start     Dose/Rate Route Frequency Ordered Stop   05/26/12 0600  ertapenem (INVANZ) 1 g in sodium chloride 0.9 % 50 mL IVPB  Status:  Discontinued     1 g 100 mL/hr over 30 Minutes Intravenous Every 24 hours 05/25/12 0733 05/25/12 1730   05/26/12 0600  ertapenem (INVANZ) 1 g in sodium chloride 0.9 % 50 mL IVPB     1 g 100 mL/hr over 30 Minutes Intravenous Every 24 hours 05/25/12 1729     05/25/12 0545  ertapenem (INVANZ) 1 g in sodium chloride 0.9 % 50 mL IVPB     1 g 100 mL/hr over 30 Minutes Intravenous  Once 05/25/12 0540 05/25/12 0716       Assessment/Plan POD #6 s/p Laparoscopy, ex lap, LOA, appendectomy, small bowel resection for perforated, gangrenous appendicitis  1. Advance diet to regular diet,  oral pain meds  2. IVF, pain control - add oxy IR, d/c invanz 3. Ambulation and IS  4. Continue open wound packing WD dressing changes 5. SCD's and lovenox    ABL Anemia - Hgb to 6.5 today, 1 gm from yesterday, getting PRBC HTN  H/o grade 1 diastolic dysfunction, mild AS/AI  H/o lung cancer  CKD  B/L Leg weakness/sorenss - not consistent with DVT, more consistent with deconditioning and blood loss    LOS: 6 days    DORT, Sherman Lipuma 05/31/2012, 12:54 PM Pager: [phone]

## 2012-05-31 NOTE — Progress Notes (Signed)
Lab reports her HGB is 6.5 this am - was last 7.5.  Nurse tech tells me that when pt had a stool last night it had some red mixed in it.  Pt VSS, abd is soft, + bs tolerating her full liquid diet this shift.  K Schoor is on call and notified, and new orders received.

## 2012-06-01 ENCOUNTER — Inpatient Hospital Stay (HOSPITAL_COMMUNITY): Payer: Medicare Other

## 2012-06-01 DIAGNOSIS — I5033 Acute on chronic diastolic (congestive) heart failure: Secondary | ICD-10-CM

## 2012-06-01 LAB — CBC
HCT: 30.5 % — ABNORMAL LOW (ref 36.0–46.0)
MCV: 93.8 fL (ref 78.0–100.0)
Platelets: 240 10*3/uL (ref 150–400)
RBC: 3.25 MIL/uL — ABNORMAL LOW (ref 3.87–5.11)
WBC: 9.1 10*3/uL (ref 4.0–10.5)

## 2012-06-01 LAB — TYPE AND SCREEN
ABO/RH(D): O POS
Antibody Screen: NEGATIVE
Unit division: 0
Unit division: 0

## 2012-06-01 LAB — FOLATE: Folate: >20 ng/mL

## 2012-06-01 LAB — VITAMIN B12: Vitamin B-12: 414 pg/mL (ref 211–911)

## 2012-06-01 MED ORDER — DEXTROSE 5 % IV SOLN
1.0000 g | Freq: Three times a day (TID) | INTRAVENOUS | Status: DC
  Administered 2012-06-01 – 2012-06-02 (×2): 1 g via INTRAVENOUS
  Filled 2012-06-01 (×3): qty 1

## 2012-06-01 MED ORDER — VANCOMYCIN HCL IN DEXTROSE 750-5 MG/150ML-% IV SOLN
750.0000 mg | Freq: Once | INTRAVENOUS | Status: AC
  Administered 2012-06-01: 750 mg via INTRAVENOUS
  Filled 2012-06-01: qty 150

## 2012-06-01 MED ORDER — VANCOMYCIN HCL IN DEXTROSE 750-5 MG/150ML-% IV SOLN
750.0000 mg | Freq: Two times a day (BID) | INTRAVENOUS | Status: DC
  Administered 2012-06-01: 750 mg via INTRAVENOUS
  Filled 2012-06-01 (×2): qty 150

## 2012-06-01 MED ORDER — ZOLPIDEM TARTRATE 5 MG PO TABS
5.0000 mg | ORAL_TABLET | Freq: Once | ORAL | Status: AC
  Administered 2012-06-01: 5 mg via ORAL
  Filled 2012-06-01: qty 1

## 2012-06-01 MED ORDER — AZTREONAM 1 G IJ SOLR
1.0000 g | Freq: Once | INTRAMUSCULAR | Status: AC
  Administered 2012-06-01: 1 g via INTRAVENOUS
  Filled 2012-06-01: qty 1

## 2012-06-01 MED ORDER — IOHEXOL 300 MG/ML  SOLN
80.0000 mL | Freq: Once | INTRAMUSCULAR | Status: AC | PRN
  Administered 2012-06-01: 80 mL via INTRAVENOUS

## 2012-06-01 MED ORDER — FUROSEMIDE 10 MG/ML IJ SOLN: 40.0000 mg | Freq: Once | INTRAMUSCULAR | Status: DC

## 2012-06-01 NOTE — Progress Notes (Signed)
Physical Therapy Treatment Patient Details Name: [name] MRN: [id number] DOB: [birth year] Today's Date: 06/01/2012 Time: 4098-1191 PT Time Calculation (min): 28 min  PT Assessment / Plan / Recommendation Comments on Treatment Session  Pt continues to require assist for mobility however able to tolerate short distance ambulation on 2L O2 [place] and increased time. Pt fatigues quickly.    Follow Up Recommendations  Supervision/Assistance - 24 hour;SNF     Does the patient have the potential to tolerate intense rehabilitation     Barriers to Discharge        Equipment Recommendations  Rolling walker with 5" wheels    Recommendations for Other Services    Frequency     Plan Discharge plan remains appropriate;Frequency remains appropriate    Precautions / Restrictions Precautions Precaution Comments: monitor sats   Pertinent Vitals/Pain Reports increased abdominal pain, RN notified    Mobility  Bed Mobility Bed Mobility: Rolling Left;Left Sidelying to Sit Rolling Left: 5: Supervision;With rail Left Sidelying to Sit: 4: Min assist;With rails Details for Bed Mobility Assistance: assist for trunk upright after propping onto elbow Transfers Transfers: Sit to Stand;Stand to Sit Sit to Stand: With upper extremity assist;From bed;3: Mod assist;From chair/3-in-1 Stand to Sit: 4: Min assist;With upper extremity assist;To chair/3-in-1 Details for Transfer Assistance: verbal cues for safe technique, assist to rise and control descent Ambulation/Gait Ambulation/Gait Assistance: 4: Min assist Ambulation Distance (Feet): 80 Feet Ambulation/Gait Assistance Details: pt ambulated 40' then required seated rest break prior to returning to room, ambulated on 2L O2 [place] with SaO2 96% prior to rest break, very slow, requires increased time Gait Pattern: Decreased stride length;Step-through pattern;Trunk flexed Gait velocity: decreased General Gait Details: verbal cues for safe use of RW and  posture    Exercises     PT Diagnosis:    PT Problem List:   PT Treatment Interventions:     PT Goals Acute Rehab PT Goals PT Goal: Supine/Side to Sit - Progress: Progressing toward goal PT Goal: Sit to Stand - Progress: Progressing toward goal PT Goal: Stand to Sit - Progress: Progressing toward goal PT Goal: Ambulate - Progress: Progressing toward goal  Visit Information  Last PT Received On: 06/01/12 Assistance Needed: +2    Subjective Data  Subjective: Not feeling too good.   Cognition  Cognition Arousal/Alertness: Awake/alert Behavior During Therapy: WFL for tasks assessed/performed Overall Cognitive Status: Within Functional Limits for tasks assessed    Balance     End of Session PT - End of Session Equipment Utilized During Treatment: Oxygen Activity Tolerance: Patient limited by fatigue;Patient limited by pain Patient left: in chair;with call bell/phone within reach;with family/visitor present Nurse Communication: Mobility status;Patient requests pain meds   GP     Clemens Lachman,[name] 06/01/2012, 1:14 PM Zenovia Jarred, PT, DPT 06/01/2012 Pager: [phone]

## 2012-06-01 NOTE — Progress Notes (Signed)
7 Days Post-Op  Subjective: Pt still not feeling well, c/o pain in abdomen (now in the upper abdomen), no nausea or vomiting.  Having BM's and flatus.  Urinating on own.  Ambulating to commode.  Objective: Vital signs in last 24 hours: Temp:  [98.7 F (37.1 C)-99.2 F (37.3 C)] 98.7 F (37.1 C) (04/17 0513) Pulse Rate:  [78-84] 81 (04/17 0513) Resp:  [16-20] 20 (04/17 0513) BP: (117-140)/(46-58) 140/58 mmHg (04/17 0513) SpO2:  [85 %-100 %] 96 % (04/17 0842) Last BM Date: 05/31/12  Intake/Output from previous day: 04/16 0701 - 04/17 0700 In: 1173.8 [P.O.:480; Blood:693.8] Out: 2425 [Urine:2425] Intake/Output this shift: Total I/O In: -  Out: 250 [Urine:250]  PE: Gen:  Alert, NAD, pleasant Abd: Soft, moderately tender in the upper abdomen and over open incision, ND, +BS, no HSM, incisions C/D/I   Lab Results:   Recent Labs  05/31/12 0435 05/31/12 1802 06/01/12 0507  WBC 7.1  --  9.1  HGB 6.5* 9.4* 10.3*  HCT 19.9* 27.6* 30.5*  PLT 206  --  240   BMET  Recent Labs  05/31/12 0435 05/31/12 1802  NA 138 135  K 3.4* 4.0  CL 103 99  CO2 29 26  GLUCOSE 83 104*  BUN 9 8  CREATININE 1.01 0.88  CALCIUM 8.1* 8.3*   PT/INR No results found for this basename: LABPROT, INR,  in the last 72 hours CMP     Component Value Date/Time   NA 135 05/31/2012 1802   NA 147* 09/20/2011 0817   K 4.0 05/31/2012 1802   K 4.2 09/20/2011 0817   CL 99 05/31/2012 1802   CL 100 09/20/2011 0817   CO2 26 05/31/2012 1802   CO2 29 09/20/2011 0817   GLUCOSE 104* 05/31/2012 1802   GLUCOSE 88 09/20/2011 0817   BUN 8 05/31/2012 1802   BUN 20 09/20/2011 0817   CREATININE 0.88 05/31/2012 1802   CREATININE 1.6* 09/20/2011 0817   CALCIUM 8.3* 05/31/2012 1802   CALCIUM 9.1 09/20/2011 0817   PROT 8.1 05/25/2012 0250   PROT 7.4 09/20/2011 0817   ALBUMIN 4.0 05/25/2012 0250   AST 14 05/25/2012 0250   AST 17 09/20/2011 0817   ALT 11 05/25/2012 0250   ALKPHOS 52 05/25/2012 0250   ALKPHOS 69 09/20/2011 0817   BILITOT  0.4 05/25/2012 0250   BILITOT 0.60 09/20/2011 0817   GFRNONAA 65* 05/31/2012 1802   GFRAA 75* 05/31/2012 1802   Lipase     Component Value Date/Time   LIPASE 27 05/25/2012 0250       Studies/Results: Dg Chest 2 View  06/01/2012  *RADIOLOGY REPORT*  Clinical Data: Shortness of breath, hypoxia, left side abdominal pain, history hypertension, smoking, lung cancer, COPD GERD, Barrett's esophagus  CHEST - 2 VIEW  Comparison: 05/25/2012  Findings: Enlargement of cardiac silhouette with pulmonary vascular congestion. Atherosclerotic calcification aorta. Surgical clips and AP window/left hilum question prior left upper lobe resection. Left basilar infiltrate. Atelectasis versus infiltrate right base. Linear scarring or subsegmental atelectasis in left upper lobe appearing slightly more nodular than on previous exams, cannot exclude developing pulmonary nodule. No gross pleural effusion or pneumothorax.  IMPRESSION: Enlargement of cardiac silhouette with pulmonary vascular congestion. Left basilar infiltrate. Right basilar atelectasis versus consolidation. Linear scarring versus chronic atelectasis in left upper lobe, appearing more nodular than on previous exams; CT chest recommended to exclude recurrent pulmonary tumor, exam with contrast if patient's renal function permits.   Original Report Authenticated By: Ulyses Southward, M.D.  Anti-infectives: Anti-infectives   Start     Dose/Rate Route Frequency Ordered Stop   06/01/12 2359  vancomycin (VANCOCIN) IVPB 750 mg/150 ml premix     750 mg 150 mL/hr over 60 Minutes Intravenous Every 12 hours 06/01/12 1008     06/01/12 2000  aztreonam (AZACTAM) 1 g in dextrose 5 % 50 mL IVPB     1 g 100 mL/hr over 30 Minutes Intravenous Every 8 hours 06/01/12 1008     06/01/12 1100  aztreonam (AZACTAM) 1 g in dextrose 5 % 50 mL IVPB     1 g 100 mL/hr over 30 Minutes Intravenous  Once 06/01/12 1008     06/01/12 1100  vancomycin (VANCOCIN) IVPB 750 mg/150 ml premix      750 mg 150 mL/hr over 60 Minutes Intravenous  Once 06/01/12 1008     05/26/12 0600  ertapenem (INVANZ) 1 g in sodium chloride 0.9 % 50 mL IVPB  Status:  Discontinued     1 g 100 mL/hr over 30 Minutes Intravenous Every 24 hours 05/25/12 0733 05/25/12 1730   05/26/12 0600  ertapenem (INVANZ) 1 g in sodium chloride 0.9 % 50 mL IVPB  Status:  Discontinued     1 g 100 mL/hr over 30 Minutes Intravenous Every 24 hours 05/25/12 1729 05/31/12 1338   05/25/12 0545  ertapenem (INVANZ) 1 g in sodium chloride 0.9 % 50 mL IVPB     1 g 100 mL/hr over 30 Minutes Intravenous  Once 05/25/12 0540 05/25/12 0716       Assessment/Plan POD #6 s/p Laparoscopy, ex lap, LOA, appendectomy, small bowel resection for perforated, gangrenous appendicitis  1. Advance diet to regular diet, oral pain meds  2. IVF, pain control - add oxy IR, d/c invanz  3. Ambulation and IS  4. Continue open wound packing WD dressing changes  5. SCD's and lovenox  6. Consider CT scan for upper abdominal pain, may be related to lung issue, will await CT chest results  ABL Anemia - Hgb to 10.3 today, after pRBC HTN  H/o grade 1 diastolic dysfunction, mild AS/AI  H/o lung cancer  CKD  Deconditioning     LOS: 7 days    DORT, Orel Cooler 06/01/2012, 10:31 AM Pager: [phone]

## 2012-06-01 NOTE — Progress Notes (Signed)
[facility] PROGRESS NOTE  [name] ZOX:[id number] DOB: 09-May-1940 DOA: 05/25/2012 PCP: [name], MD  Brief Narrative: [name] is a 72 y.o. female with h/o COPD, hypertension, hyperlipidemia, chronic kidney disease with a baseline creatinine 1.4-1.6, presents a chief complaint of abdominal pain for the past for 5 days. She was found to have acute cholecystitis, subsequently underwent ex lap, Appy, lysis of adhesions and small bowel resection. Her post operative course was significant for pain, tachycardia, anemia.   Assessment/Plan:  1. Perforated Gangrenous appendicitis: ex lap, lysis of adhesions, appendectomy, small bowel resection 4/10 -POD #7 -NGT removed 4/13, cut down IVF, started on clears, advancing diet as tolerated.  -per [facility] - pt denies nausea or vomiting. Persistent tender abdomen.   2. COPD: stable, albuterol nebs PRN -incentive spirometry Q1H  3. H/o grade 1 diastolic dysfunction/mild AS/AI/ acute on chronic diastolic heart failure - probably from IV fluids given post op.  Last eCHO was done less than 6 months ago, showed good LVEF and diastolic dysfunction.   I/O last 3 completed shifts: In: 2663.8 [P.O.:1680; I.V.:240; Blood:693.8; IV Piggyback:50] Out: 2775 [Urine:2775] Total I/O In: -  Out: 250 [Urine:250]  - CXR showed pulmonary congestion, with left infiltrate and nodular densities. A CT chest with contrast ordered to evaluate for recurrence of the lung cancer. Meanwhile she will be treated for Health care associated pneumonia with aztreonam and vancomycin. We will also start her on IV lasix for appropriate diuresis.  - pro bnp ordered.  - PT/OT eval   4. HTN: -Was on IV hydralazine and metoprolol , change to PO Toprol XL -slowly resume home antihypertensives  5. Tachycardia:improved -post op pain and Beta blocker withdrawal -resume PO Betablocker  5. H/o lung CA -s/p left upper lobe lobectomy in 2010 - rpeat CT chest with contrast  ordered.   6. CKD: -baseline creatinine 1.4-1.6 -stable, monitor  7. Anemia: post op/chronic disease and hemodilution. Stool for occult blood negative.  -transfuse if <7 or symptomatic -received 2 units prbc transfusion yesterday and H&H has improved to 10.3/30.5.   8. HCAP: Started on aztreonam and vancomycin.   DVT proph:lovenox   PT/OT OOB  Code Status: DNR/DNI  Family Communication: daughter at bedside Disposition Plan: SNF when stable    Consultants:  [facility]  [facility]  [facility]  Procedures: PROCEDURE: Laparoscopy, exploratory laparotomy, lysis of adhesions, appendectomy, small bowel resection 4/10   Antibiotics:  Ertapenem 4/10  HPI/Subjective: BM today.   Objective: Filed Vitals:   05/31/12 1950 05/31/12 2126 06/01/12 0513 06/01/12 0842  BP:  131/49 140/58   Pulse:  84 81   Temp:  98.9 F (37.2 C) 98.7 F (37.1 C)   TempSrc:  Oral Oral   Resp:  18 20   Height:      Weight:      SpO2: 100% 100% 100% 96%    Intake/Output Summary (Last 24 hours) at 06/01/12 0958 Last data filed at 06/01/12 8119  Gross per 24 hour  Intake 933.75 ml  Output   2500 ml  Net -1566.25 ml   Filed Weights   05/25/12 1002  Weight: 74.2 kg (163 lb 9.3 oz)    Exam:   General:  AAOx3  Cardiovascular: S1S2/RRR  Respiratory: decreased BS at bases  Abdomen: soft, distended, tenderness as expected, dressing, BS present but diminished  Ext: no edema c/c  Neuro: moves all extremities   Data Reviewed: Basic Metabolic Panel:  Recent Labs Lab 05/28/12 0601 05/29/12 0814 05/30/12  1610 05/31/12 0435 05/31/12 1802  NA 139 139 138 138 135  K 4.3 4.1 3.8 3.4* 4.0  CL 104 104 103 103 99  CO2 21 26 27 29 26   GLUCOSE 71 115* 91 83 104*  BUN 23 15 12 9 8   CREATININE 1.27* 0.95 1.00 1.01 0.88  CALCIUM 8.6 8.6 8.2* 8.1* 8.3*  MG  --  1.8  --   --   --    Liver Function Tests: No results found for this basename: AST, ALT, ALKPHOS, BILITOT,  PROT, ALBUMIN,  in the last 168 hours No results found for this basename: LIPASE, AMYLASE,  in the last 168 hours No results found for this basename: AMMONIA,  in the last 168 hours CBC:  Recent Labs Lab 05/27/12 0350 05/28/12 0601 05/30/12 0454 05/31/12 0435 05/31/12 1802 06/01/12 0507  WBC 11.2* 11.2* 8.8 7.1  --  9.1  HGB 8.3* 8.7* 7.5* 6.5* 9.4* 10.3*  HCT 25.4* 26.4* 21.8* 19.9* 27.6* 30.5*  MCV 101.2* 100.4* 99.5 99.0  --  93.8  PLT 154 165 205 206  --  240   Cardiac Enzymes: No results found for this basename: CKTOTAL, CKMB, CKMBINDEX, TROPONINI,  in the last 168 hours BNP (last 3 results) No results found for this basename: PROBNP,  in the last 8760 hours CBG: No results found for this basename: GLUCAP,  in the last 168 hours  Recent Results (from the past 240 hour(s))  SURGICAL PCR SCREEN     Status: None   Collection Time    05/25/12 10:52 AM      Result Value Range Status   MRSA, PCR NEGATIVE  NEGATIVE Final   Staphylococcus aureus NEGATIVE  NEGATIVE Final   Comment:            The Xpert SA Assay (FDA     approved for NASAL specimens     in patients over 71 years of age),     is one component of     a comprehensive surveillance     program.  Test performance has     been validated by [employer] for patients greater     than or equal to 62 year old.     It is not intended     to diagnose infection nor to     guide or monitor treatment.     Studies: Dg Chest 2 View  06/01/2012  *RADIOLOGY REPORT*  Clinical Data: Shortness of breath, hypoxia, left side abdominal pain, history hypertension, smoking, lung cancer, COPD GERD, Barrett's esophagus  CHEST - 2 VIEW  Comparison: 05/25/2012  Findings: Enlargement of cardiac silhouette with pulmonary vascular congestion. Atherosclerotic calcification aorta. Surgical clips and AP window/left hilum question prior left upper lobe resection. Left basilar infiltrate. Atelectasis versus infiltrate right base. Linear  scarring or subsegmental atelectasis in left upper lobe appearing slightly more nodular than on previous exams, cannot exclude developing pulmonary nodule. No gross pleural effusion or pneumothorax.  IMPRESSION: Enlargement of cardiac silhouette with pulmonary vascular congestion. Left basilar infiltrate. Right basilar atelectasis versus consolidation. Linear scarring versus chronic atelectasis in left upper lobe, appearing more nodular than on previous exams; CT chest recommended to exclude recurrent pulmonary tumor, exam with contrast if patient's renal function permits.   Original Report Authenticated By: Ulyses Southward, M.D.     Scheduled Meds: . enoxaparin (LOVENOX) injection  40 mg Subcutaneous Q24H  . furosemide  40 mg Intravenous Once  . ipratropium  0.5 mg  Nebulization TID  . levalbuterol  0.63 mg Nebulization TID  . metoprolol succinate  25 mg Oral Daily  . pantoprazole  40 mg Oral Daily   Continuous Infusions: . sodium chloride 20 mL/hr at 05/30/12 0146    Principal Problem:   Acute perforated appendicitis with generalized peritonitis Active Problems:   HYPERLIPIDEMIA   AORTIC STENOSIS/ INSUFFICIENCY, NON-RHEUMATIC   C O P D   Stricture and stenosis of esophagus   Heart palpitations   CKD (chronic kidney disease)   Lung cancer   COPD, moderate    Time spent:    Elva Mauro  [facility] Pager [phone] If 7PM-7AM, please contact night-coverage at [email or web address], password [facility] 06/01/2012, 9:58 AM  LOS: 7 days

## 2012-06-01 NOTE — Progress Notes (Signed)
ANTIBIOTIC CONSULT NOTE - INITIAL  Pharmacy Consult for Vancomycin and Aztreonam Indication: rule out pneumonia  Allergies  Allergen Reactions  . Amoxicillin Itching  . Fentanyl Other (See Comments)    hallucinations  . Hydrocodone Other (See Comments)    ineffective  . Codeine Hives and Rash  . Penicillins Hives and Rash    Patient Measurements: Height: 5\' 2"  (157.5 cm) Weight: 163 lb 9.3 oz (74.2 kg) IBW/kg (Calculated) : 50.1  Vital Signs: Temp: 98.7 F (37.1 C) (04/17 0513) Temp src: Oral (04/17 0513) BP: 140/58 mmHg (04/17 0513) Pulse Rate: 81 (04/17 0513) Intake/Output from previous day: 04/16 0701 - 04/17 0700 In: 1173.8 [P.O.:480; Blood:693.8] Out: 2425 [Urine:2425] Intake/Output from this shift: Total I/O In: -  Out: 250 [Urine:250]  Labs:  Recent Labs  05/30/12 0454 05/31/12 0435 05/31/12 1802 06/01/12 0507  WBC 8.8 7.1  --  9.1  HGB 7.5* 6.5* 9.4* 10.3*  PLT 205 206  --  240  CREATININE 1.00 1.01 0.88  --    Estimated Creatinine Clearance: 55.3 ml/min (by C-G formula based on Cr of 0.88).  65 ml/min/1.11m2 (normalized)   Microbiology: Recent Results (from the past 720 hour(s))  SURGICAL PCR SCREEN     Status: None   Collection Time    05/25/12 10:52 AM      Result Value Range Status   MRSA, PCR NEGATIVE  NEGATIVE Final   Staphylococcus aureus NEGATIVE  NEGATIVE Final   Comment:            The Xpert SA Assay (FDA     approved for NASAL specimens     in patients over 86 years of age),     is one component of     a comprehensive surveillance     program.  Test performance has     been validated by [employer] for patients greater     than or equal to 17 year old.     It is not intended     to diagnose infection nor to     guide or monitor treatment.    Medical History: Past Medical History  Diagnosis Date  . HYPERLIPIDEMIA 11/18/2006  . HYPERKALEMIA 02/05/2008  . ANEMIA-IRON DEFICIENCY 09/22/2008  . Anemia of other chronic  disease 02/05/2008  . ANXIETY 06/03/2008  . DEPRESSION 04/28/2009  . HYPERTENSION 11/18/2006  . PERICARDITIS 01/03/2007  . AORTIC STENOSIS/ INSUFFICIENCY, NON-RHEUMATIC 12/03/2008  . SINUSITIS- ACUTE-NOS 11/20/2007  . SINUSITIS, CHRONIC 02/05/2008  . ALLERGIC RHINITIS 11/18/2006  . PNEUMONIA 05/02/2007  . C O P D 06/27/2008  . Stricture and stenosis of esophagus 11/14/2008  . GERD 01/03/2007  . BARRETTS ESOPHAGUS 11/14/2008  . RENAL INSUFFICIENCY 02/05/2008  . DYSPEPSIA 03/23/2007  . PRURITUS 10/08/2008  . OSTEOARTHRITIS 11/18/2006  . OSTEOARTHRITIS, KNEE, LEFT 01/03/2007  . Cervicalgia 01/13/2009  . BACK PAIN 02/04/2009  . BURSITIS, LEFT HIP 04/18/2009  . OSTEOPOROSIS 11/18/2006  . INSOMNIA-SLEEP DISORDER-UNSPEC 06/03/2008  . FATIGUE 04/18/2009  . PERIPHERAL EDEMA 02/13/2009  . MURMUR 11/18/2006  . DYSPHAGIA UNSPECIFIED 03/23/2007  . Abdominal pain, generalized 03/01/2007  . Nonspecific (abnormal) findings on radiological and other examination of body structure 06/03/2008  . Tuberculin Test Reaction 11/18/2006  . Acute bronchitis 02/23/2010  . CHOLELITHIASIS 04/24/2010  . NEPHROLITHIASIS, HX OF 04/24/2010  . Polyarthralgia 06/09/2010  . Elevated sed rate 06/11/2010  . Positive ANA (antinuclear antibody) 06/11/2010  . Rheumatoid factor positive 06/11/2010  . Cancer     lung ca  .  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 SPONDYLOSIS, CERVICAL, WITH RADICULOPATHY 01/13/2009  . Cervical radiculopathy 09/28/2010  . Lumbar radiculopathy 09/28/2010    Medications:  Scheduled:  . enoxaparin (LOVENOX) injection  40 mg Subcutaneous Q24H  . furosemide  40 mg Intravenous Once  . ipratropium  0.5 mg Nebulization TID  . levalbuterol  0.63 mg Nebulization TID  . metoprolol succinate  25 mg Oral Daily  . pantoprazole  40 mg Oral Daily  . [COMPLETED] zolpidem  5 mg Oral Once  . [DISCONTINUED] ertapenem (INVANZ) IV  1 g Intravenous Q24H  . [DISCONTINUED] furosemide  40 mg Oral Daily   Infusions:  . sodium  chloride 20 mL/hr at 05/30/12 0146   Assessment: 72 yo female presents with 5 days abdominal pain. Dx with acute cholecystitis, subsequently underwent ex lap, appendectomyy, lysis of adhesions and small bowel resection on 4/10. POD#7, MD concerned for possible PNA, adjusting antibiotics from Ertepenem (started 4/11) to Vancomycin/Aztreonam. PCN allergy noted.   Goal of Therapy:  Vancomycin trough level 15-20 mcg/ml Aztreonam dose per renal function  Plan:   Vancomycin 750mg  IV q12h Check trough at steady state Aztreonam 1gm IV q8h Follow up renal function & cultures, de-escalation as appropriate  Loralee Pacas, PharmD, BCPS Pager: [phone] 06/01/2012,10:06 AM

## 2012-06-02 DIAGNOSIS — I509 Heart failure, unspecified: Secondary | ICD-10-CM

## 2012-06-02 DIAGNOSIS — I5031 Acute diastolic (congestive) heart failure: Secondary | ICD-10-CM

## 2012-06-02 LAB — BASIC METABOLIC PANEL
BUN: 8 mg/dL (ref 6–23)
CO2: 27 mEq/L (ref 19–32)
Chloride: 98 mEq/L (ref 96–112)
Creatinine, Ser: 1.06 mg/dL (ref 0.50–1.10)
Potassium: 3.3 mEq/L — ABNORMAL LOW (ref 3.5–5.1)

## 2012-06-02 LAB — PRO B NATRIURETIC PEPTIDE: Pro B Natriuretic peptide (BNP): 3699 pg/mL — ABNORMAL HIGH (ref 0–125)

## 2012-06-02 MED ORDER — ADULT MULTIVITAMIN W/MINERALS CH
1.0000 | ORAL_TABLET | Freq: Every day | ORAL | Status: DC
  Administered 2012-06-02 – 2012-06-06 (×5): 1 via ORAL
  Filled 2012-06-02 (×5): qty 1

## 2012-06-02 MED ORDER — FUROSEMIDE 10 MG/ML IJ SOLN
40.0000 mg | Freq: Once | INTRAMUSCULAR | Status: AC
  Administered 2012-06-02: 40 mg via INTRAVENOUS
  Filled 2012-06-02: qty 4

## 2012-06-02 MED ORDER — POTASSIUM CHLORIDE CRYS ER 20 MEQ PO TBCR
40.0000 meq | EXTENDED_RELEASE_TABLET | Freq: Two times a day (BID) | ORAL | Status: AC
  Administered 2012-06-02 (×2): 40 meq via ORAL
  Filled 2012-06-02 (×2): qty 2

## 2012-06-02 MED ORDER — ENSURE COMPLETE PO LIQD
237.0000 mL | ORAL | Status: DC
  Administered 2012-06-02 – 2012-06-03 (×2): 237 mL via ORAL

## 2012-06-02 NOTE — Progress Notes (Signed)
8 Days Post-Op  Subjective: More comfortable today Tolerating po  Objective: Vital signs in last 24 hours: Temp:  [98.8 F (37.1 C)-99.3 F (37.4 C)] 98.8 F (37.1 C) (04/18 0540) Pulse Rate:  [70-85] 82 (04/18 0540) Resp:  [18-20] 20 (04/18 0540) BP: (126-141)/(41-51) 126/51 mmHg (04/18 0540) SpO2:  [96 %-100 %] 98 % (04/18 0540) Last BM Date: 05/31/12  Intake/Output from previous day: 04/17 0701 - 04/18 0700 In: 829 [P.O.:600; I.V.:229] Out: 2600 [Urine:2600] Intake/Output this shift:    Comfortable in appearance Abdomen soft, non distended, minimally tender  Lab Results:   Recent Labs  05/31/12 0435 05/31/12 1802 06/01/12 0507  WBC 7.1  --  9.1  HGB 6.5* 9.4* 10.3*  HCT 19.9* 27.6* 30.5*  PLT 206  --  240   BMET  Recent Labs  05/31/12 1802 06/02/12 0430  NA 135 135  K 4.0 3.3*  CL 99 98  CO2 26 27  GLUCOSE 104* 76  BUN 8 8  CREATININE 0.88 1.06  CALCIUM 8.3* 8.4   PT/INR No results found for this basename: LABPROT, INR,  in the last 72 hours ABG No results found for this basename: PHART, PCO2, PO2, HCO3,  in the last 72 hours  Studies/Results: Dg Chest 2 View  06/01/2012  *RADIOLOGY REPORT*  Clinical Data: Shortness of breath, hypoxia, left side abdominal pain, history hypertension, smoking, lung cancer, COPD GERD, Barrett's esophagus  CHEST - 2 VIEW  Comparison: 05/25/2012  Findings: Enlargement of cardiac silhouette with pulmonary vascular congestion. Atherosclerotic calcification aorta. Surgical clips and AP window/left hilum question prior left upper lobe resection. Left basilar infiltrate. Atelectasis versus infiltrate right base. Linear scarring or subsegmental atelectasis in left upper lobe appearing slightly more nodular than on previous exams, cannot exclude developing pulmonary nodule. No gross pleural effusion or pneumothorax.  IMPRESSION: Enlargement of cardiac silhouette with pulmonary vascular congestion. Left basilar infiltrate. Right  basilar atelectasis versus consolidation. Linear scarring versus chronic atelectasis in left upper lobe, appearing more nodular than on previous exams; CT chest recommended to exclude recurrent pulmonary tumor, exam with contrast if patient's renal function permits.   Original Report Authenticated By: Ulyses Southward, M.D.    Ct Chest W Contrast  06/01/2012  *RADIOLOGY REPORT*  Clinical Data: Follow-up abnormal chest x-ray.  Shortness of breath.  CT CHEST WITH CONTRAST  Technique:  Multidetector CT imaging of the chest was performed following the standard protocol during bolus administration of intravenous contrast.  Contrast: 80mL OMNIPAQUE IOHEXOL 300 MG/ML  SOLN  Comparison: Chest x-ray 06/01/2012 and chest CT 09/20/2011.  Findings: The chest wall is unremarkable.  No breast masses, supraclavicular or axillary adenopathy.  The thyroid gland appears normal.  The bony thorax is intact.  No destructive bone lesions or spinal canal compromise.  The heart is mildly enlarged but stable.  Stable coronary artery calcifications and dense aortic calcifications.  No aortic aneurysm or dissection.  There are scattered mediastinal and hilar lymph nodes but these appear relatively stable.  The esophagus is grossly normal.  There is a small hiatal hernia.  Examination of the lung parenchyma demonstrates patchy areas of subpleural atelectasis and minimal peripheral interstitial lung disease.  There is a small rim of left pleural fluid on the left. No infiltrates, masses or worrisome nodules.  The bony structures are unremarkable. The upper abdomen demonstrates cholelithiasis and a left renal calculus.  IMPRESSION:  1.  Small left pleural effusion. 2.  Patchy areas of subpleural atelectasis and probable peripheral and basilar interstitial  lung disease. 3.  No worrisome mass lesions or pulmonary nodules.  Left upper lobe scarring changes are noted. 4.  Cardiac enlargement with dense coronary artery calcifications.   Original Report  Authenticated By: Rudie Meyer, M.D.     Anti-infectives: Anti-infectives   Start     Dose/Rate Route Frequency Ordered Stop   06/01/12 2359  vancomycin (VANCOCIN) IVPB 750 mg/150 ml premix     750 mg 150 mL/hr over 60 Minutes Intravenous Every 12 hours 06/01/12 1008     06/01/12 2000  aztreonam (AZACTAM) 1 g in dextrose 5 % 50 mL IVPB     1 g 100 mL/hr over 30 Minutes Intravenous Every 8 hours 06/01/12 1008     06/01/12 1100  aztreonam (AZACTAM) 1 g in dextrose 5 % 50 mL IVPB     1 g 100 mL/hr over 30 Minutes Intravenous  Once 06/01/12 1008 06/01/12 1223   06/01/12 1100  vancomycin (VANCOCIN) IVPB 750 mg/150 ml premix     750 mg 150 mL/hr over 60 Minutes Intravenous  Once 06/01/12 1008 06/01/12 1328   05/26/12 0600  ertapenem (INVANZ) 1 g in sodium chloride 0.9 % 50 mL IVPB  Status:  Discontinued     1 g 100 mL/hr over 30 Minutes Intravenous Every 24 hours 05/25/12 0733 05/25/12 1730   05/26/12 0600  ertapenem (INVANZ) 1 g in sodium chloride 0.9 % 50 mL IVPB  Status:  Discontinued     1 g 100 mL/hr over 30 Minutes Intravenous Every 24 hours 05/25/12 1729 05/31/12 1338   05/25/12 0545  ertapenem (INVANZ) 1 g in sodium chloride 0.9 % 50 mL IVPB     1 g 100 mL/hr over 30 Minutes Intravenous  Once 05/25/12 0540 05/25/12 0716      Assessment/Plan: s/p Procedure(s): APPENDECTOMY LAPAROSCOPIC  CONVERTED TO  OPEN APPENDECTOMY, exploratory laparatomy (N/A) SMALL BOWEL RESECTION (N/A) LAPAROSCOPIC LYSIS OF ADHESIONS (N/A)   Continuing current care Encourage increased activity IV antibiotics  LOS: 8 days    [name] A 06/02/2012

## 2012-06-02 NOTE — Progress Notes (Signed)
[facility] PROGRESS NOTE  [name] ZOX:[id number] DOB: [birth year] DOA: 05/25/2012 PCP: [name], MD  Brief Narrative: [name] is a 72 y.o. female with h/o COPD, hypertension, hyperlipidemia, chronic kidney disease with a baseline creatinine 1.4-1.6, presents a chief complaint of abdominal pain for the past for 5 days. She was found to have acute cholecystitis, subsequently underwent ex lap, Appy, lysis of adhesions and small bowel resection. Her post operative course was significant for pain, tachycardia, anemia.   Assessment/Plan:  1. Perforated Gangrenous appendicitis: ex lap, lysis of adhesions, appendectomy, small bowel resection 4/10 -POD #8 -NGT removed 4/13, cut down IVF, started on clears, advancing diet as tolerated.  -per [facility] - pt denies nausea or vomiting. Persistent tender abdomen.   2. COPD: stable, albuterol nebs PRN -incentive spirometry Q1H  3. H/o grade 1 diastolic dysfunction/mild AS/AI/ acute on chronic diastolic heart failure - probably from IV fluids given post op.  Last eCHO was done less than 6 months ago, showed good LVEF and diastolic dysfunction. On IV lasix and potassium supplementation.   I/O last 3 completed shifts: In: 829 [P.O.:600; I.V.:229] Out: 3650 [Urine:3650] Total I/O In: 120 [P.O.:120] Out: 200 [Urine:200]  - CXR showed pulmonary congestion, with left infiltrate and nodular densities. A CT chest with contrast ordered to evaluate for recurrence of the lung cancer, which was negative for infiltrates and recurrent lung cancer. We will stop the antibiotics. . - PT/OT eval   4. HTN: -Was on IV hydralazine and metoprolol , change to PO Toprol XL -slowly resume home antihypertensives  5. Tachycardia:improved -post op pain and Beta blocker withdrawal -resume PO Betablocker  5. H/o lung CA -s/p left upper lobe lobectomy in 2010 - rpeat CT chest with contrast ordered.   6. 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 Anemia: post op/chronic disease and hemodilution. Stool for occult blood negative.  -transfuse if <7 or symptomatic -received 2 units prbc transfusion yesterday and H&H has improved to 10.3/30.5.   DVT proph:lovenox   PT/OT OOB  Code Status: DNR/DNI  Family Communication: daughter at bedside Disposition Plan: SNF when stable    Consultants:  [facility]  [facility]  [facility]  Procedures: PROCEDURE: Laparoscopy, exploratory laparotomy, lysis of adhesions, appendectomy, small bowel resection 4/10   Antibiotics:  Ertapenem 4/10  HPI/Subjective: Feels better than yesterday and sitting int he chair.   Objective: Filed Vitals:   06/02/12 0540 06/02/12 0902 06/02/12 1255 06/02/12 1403  BP: 126/51   144/49  Pulse: 82   86  Temp: 98.8 F (37.1 C)   98.3 F (36.8 C)  TempSrc: Oral   Oral  Resp: 20   18  Height:      Weight:      SpO2: 98% 98% 97% 96%    Intake/Output Summary (Last 24 hours) at 06/02/12 1544 Last data filed at 06/02/12 0930  Gross per 24 hour  Intake    309 ml  Output   1250 ml  Net   -941 ml   Filed Weights   05/25/12 1002  Weight: 74.2 kg (163 lb 9.3 oz)    Exam:   General:  AAOx3  Cardiovascular: S1S2/RRR  Respiratory: decreased BS at bases  Abdomen: soft, distended, tenderness as expected, dressing, BS present but diminished  Ext: no edema c/c  Neuro: moves all extremities   Data Reviewed: Basic Metabolic Panel:  Recent Labs Lab 05/28/12 0601 05/29/12 0814 05/30/12 0454 05/31/12 0435 05/31/12 1802 06/02/12  0430  NA 139 139 138 138 135 135  K 4.3 4.1 3.8 3.4* 4.0 3.3*  CL 104 104 103 103 99 98  CO2 21 26 27 29 26 27   GLUCOSE 71 115* 91 83 104* 76  BUN 23 15 12 9 8 8   CREATININE 1.27* 0.95 1.00 1.01 0.88 1.06  CALCIUM 8.6 8.6 8.2* 8.1* 8.3* 8.4  MG  --  1.8  --   --   --   --    Liver Function Tests: No results found for this basename: AST, ALT, ALKPHOS, BILITOT, PROT,  ALBUMIN,  in the last 168 hours No results found for this basename: LIPASE, AMYLASE,  in the last 168 hours No results found for this basename: AMMONIA,  in the last 168 hours CBC:  Recent Labs Lab 05/27/12 0350 05/28/12 0601 05/30/12 0454 05/31/12 0435 05/31/12 1802 06/01/12 0507  WBC 11.2* 11.2* 8.8 7.1  --  9.1  HGB 8.3* 8.7* 7.5* 6.5* 9.4* 10.3*  HCT 25.4* 26.4* 21.8* 19.9* 27.6* 30.5*  MCV 101.2* 100.4* 99.5 99.0  --  93.8  PLT 154 165 205 206  --  240   Cardiac Enzymes: No results found for this basename: CKTOTAL, CKMB, CKMBINDEX, TROPONINI,  in the last 168 hours BNP (last 3 results)  Recent Labs  06/02/12 0435  PROBNP 3699.0*   CBG: No results found for this basename: GLUCAP,  in the last 168 hours  Recent Results (from the past 240 hour(s))  SURGICAL PCR SCREEN     Status: None   Collection Time    05/25/12 10:52 AM      Result Value Range Status   MRSA, PCR NEGATIVE  NEGATIVE Final   Staphylococcus aureus NEGATIVE  NEGATIVE Final   Comment:            The Xpert SA Assay (FDA     approved for NASAL specimens     in patients over 12 years of age),     is one component of     a comprehensive surveillance     program.  Test performance has     been validated by [employer] for patients greater     than or equal to 34 year old.     It is not intended     to diagnose infection nor to     guide or monitor treatment.     Studies: Dg Chest 2 View  06/01/2012  *RADIOLOGY REPORT*  Clinical Data: Shortness of breath, hypoxia, left side abdominal pain, history hypertension, smoking, lung cancer, COPD GERD, Barrett's esophagus  CHEST - 2 VIEW  Comparison: 05/25/2012  Findings: Enlargement of cardiac silhouette with pulmonary vascular congestion. Atherosclerotic calcification aorta. Surgical clips and AP window/left hilum question prior left upper lobe resection. Left basilar infiltrate. Atelectasis versus infiltrate right base. Linear scarring or subsegmental  atelectasis in left upper lobe appearing slightly more nodular than on previous exams, cannot exclude developing pulmonary nodule. No gross pleural effusion or pneumothorax.  IMPRESSION: Enlargement of cardiac silhouette with pulmonary vascular congestion. Left basilar infiltrate. Right basilar atelectasis versus consolidation. Linear scarring versus chronic atelectasis in left upper lobe, appearing more nodular than on previous exams; CT chest recommended to exclude recurrent pulmonary tumor, exam with contrast if patient's renal function permits.   Original Report Authenticated By: Ulyses Southward, M.D.    Ct Chest W Contrast  06/01/2012  *RADIOLOGY REPORT*  Clinical Data: Follow-up abnormal chest x-ray.  Shortness of breath.  CT CHEST WITH CONTRAST  Technique:  Multidetector CT imaging of the chest was performed following the standard protocol during bolus administration of intravenous contrast.  Contrast: 80mL OMNIPAQUE IOHEXOL 300 MG/ML  SOLN  Comparison: Chest x-ray 06/01/2012 and chest CT 09/20/2011.  Findings: The chest wall is unremarkable.  No breast masses, supraclavicular or axillary adenopathy.  The thyroid gland appears normal.  The bony thorax is intact.  No destructive bone lesions or spinal canal compromise.  The heart is mildly enlarged but stable.  Stable coronary artery calcifications and dense aortic calcifications.  No aortic aneurysm or dissection.  There are scattered mediastinal and hilar lymph nodes but these appear relatively stable.  The esophagus is grossly normal.  There is a small hiatal hernia.  Examination of the lung parenchyma demonstrates patchy areas of subpleural atelectasis and minimal peripheral interstitial lung disease.  There is a small rim of left pleural fluid on the left. No infiltrates, masses or worrisome nodules.  The bony structures are unremarkable. The upper abdomen demonstrates cholelithiasis and a left renal calculus.  IMPRESSION:  1.  Small left pleural effusion.  2.  Patchy areas of subpleural atelectasis and probable peripheral and basilar interstitial lung disease. 3.  No worrisome mass lesions or pulmonary nodules.  Left upper lobe scarring changes are noted. 4.  Cardiac enlargement with dense coronary artery calcifications.   Original Report Authenticated By: Rudie Meyer, M.D.     Scheduled Meds: . enoxaparin (LOVENOX) injection  40 mg Subcutaneous Q24H  . feeding supplement  237 mL Oral Q24H  . furosemide  40 mg Intravenous Once  . furosemide  40 mg Intravenous Once  . ipratropium  0.5 mg Nebulization TID  . levalbuterol  0.63 mg Nebulization TID  . metoprolol succinate  25 mg Oral Daily  . multivitamin with minerals  1 tablet Oral Daily  . pantoprazole  40 mg Oral Daily  . potassium chloride  40 mEq Oral BID   Continuous Infusions: . sodium chloride 20 mL/hr at 06/01/12 1051    Principal Problem:   Acute perforated appendicitis with generalized peritonitis Active Problems:   HYPERLIPIDEMIA   AORTIC STENOSIS/ INSUFFICIENCY, NON-RHEUMATIC   C O P D   Stricture and stenosis of esophagus   Heart palpitations   CKD (chronic kidney disease)   Lung cancer   COPD, moderate   Acute diastolic congestive heart failure    Time spent:    [facility]  [facility] Pager [phone] If 7PM-7AM, please contact night-coverage at [email or web address], password [facility] 06/02/2012, 3:44 PM  LOS: 8 days

## 2012-06-02 NOTE — Progress Notes (Signed)
INITIAL NUTRITION ASSESSMENT  DOCUMENTATION CODES Per approved criteria  -Not Applicable   INTERVENTION: Provide Ensure Complete once daily as needed Provide Multivitamin with minerals once daily Encourage PO intake >/= 75% of meals   NUTRITION DIAGNOSIS: Inadequate oral intake related to varying appetite as evidenced by pt's report of eating 50% of meals and 6% wt loss in less than 2 weeks.   Goal: Pt to meet >/= 90% of their estimated nutrition needs  Monitor:  Wt PO intake  Reason for Assessment: LOS  72 y.o. female  Admitting Dx: Acute appendicitis with generalized peritonitis  ASSESSMENT: 72 y.o. 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 Pt is now 8 days s/p Laparoscopy, ex lap, LOA, appendectomy, small bowel resection for perforated, gangrenous appendicitis.   Pt reports that he appetite is better today but, it tends to vary. Pt reports eating 50% of most meals since diet was advanced. PTA pt was eating 3 meals most days but, some days she wouldn't feel like eating and would drink 2-3 Ensure supplements. Pt reports usual body weight is 170 lbs.   Height: Ht Readings from Last 1 Encounters:  05/25/12 5\' 2"  (1.575 m)    Weight: Wt Readings from Last 1 Encounters:  05/25/12 163 lb 9.3 oz (74.2 kg)    Ideal Body Weight: 110 lbs  % Ideal Body Weight: 148%  Wt Readings from Last 10 Encounters:  05/25/12 163 lb 9.3 oz (74.2 kg)  05/25/12 163 lb 9.3 oz (74.2 kg)  05/18/12 173 lb 8 oz (78.699 kg)  04/05/12 163 lb 2 oz (73.993 kg)  02/08/12 168 lb 12.8 oz (76.567 kg)  01/25/12 167 lb (75.751 kg)  01/11/12 163 lb (73.936 kg)   01/05/12 159 lb 2 oz (72.179 kg)  12/29/11 170 lb 6 oz (77.282 kg)  12/29/11 172 lb (78.019 kg)    Usual Body Weight: 170 lbs  % Usual Body Weight: 96%  BMI:  Body mass index is 29.91 kg/(m^2).  Estimated Nutritional Needs: Kcal: 1580-1715 Protein: 74-89 grams Fluid: 2.2 L  Skin: non-pitting LLE edema; abdominal incision  Diet Order: Dysphagia  EDUCATION NEEDS: -No education needs identified at this time   Intake/Output Summary (Last 24 hours) at 06/02/12 1147 Last data filed at 06/02/12 0814  Gross per 24 hour  Intake    709 ml  Output   1500 ml  Net   -791 ml    Last BM: 4/16  Labs:   Recent Labs Lab 05/28/12 0601 05/29/12 0814  05/31/12 0435 05/31/12 1802 06/02/12 0430  NA 139 139  < > 138 135 135  K 4.3 4.1  < > 3.4* 4.0 3.3*  CL 104 104  < > 103 99 98  CO2 21 26  < > 29 26 27   BUN 23 15  < > 9 8 8   CREATININE 1.27* 0.95  < > 1.01 0.88 1.06  CALCIUM 8.6 8.6  < > 8.1* 8.3* 8.4  MG  --  1.8  --   --   --   --   GLUCOSE 71 115*  < > 83 104* 76  < > = values in this interval not displayed.  CBG (last 3)  No results found for this basename: GLUCAP,  in the last 72 hours  Scheduled Meds: . enoxaparin (LOVENOX) injection  40 mg Subcutaneous Q24H  . furosemide  40 mg Intravenous Once  . furosemide  40 mg Intravenous Once  . ipratropium  0.5 mg Nebulization TID  . levalbuterol  0.63 mg Nebulization TID  . metoprolol succinate  25 mg Oral Daily  . pantoprazole  40 mg Oral Daily  . potassium chloride  40 mEq Oral BID    Continuous Infusions: . sodium chloride 20 mL/hr at 06/01/12 1051    Past Medical History  Diagnosis Date  . HYPERLIPIDEMIA 11/18/2006  . HYPERKALEMIA 02/05/2008  . ANEMIA-IRON DEFICIENCY 09/22/2008  . Anemia of other chronic disease 02/05/2008  . ANXIETY 06/03/2008  . DEPRESSION 04/28/2009  . HYPERTENSION 11/18/2006  . PERICARDITIS 01/03/2007  . AORTIC STENOSIS/ INSUFFICIENCY, NON-RHEUMATIC 12/03/2008  . SINUSITIS- ACUTE-NOS  11/20/2007  . SINUSITIS, CHRONIC 02/05/2008  . ALLERGIC RHINITIS 11/18/2006  . PNEUMONIA 05/02/2007  . C O P D 06/27/2008  . Stricture and stenosis of esophagus 11/14/2008  . GERD 01/03/2007  . BARRETTS ESOPHAGUS 11/14/2008  . RENAL INSUFFICIENCY 02/05/2008  . DYSPEPSIA 03/23/2007  . PRURITUS 10/08/2008  . OSTEOARTHRITIS 11/18/2006  . OSTEOARTHRITIS, KNEE, LEFT 01/03/2007  . Cervicalgia 01/13/2009  . BACK PAIN 02/04/2009  . BURSITIS, LEFT HIP 04/18/2009  . OSTEOPOROSIS 11/18/2006  . INSOMNIA-SLEEP DISORDER-UNSPEC 06/03/2008  . FATIGUE 04/18/2009  . PERIPHERAL EDEMA 02/13/2009  . MURMUR 11/18/2006  . DYSPHAGIA UNSPECIFIED 03/23/2007  . Abdominal pain, generalized 03/01/2007  . Nonspecific (abnormal) findings on radiological and other examination of body structure 06/03/2008  . Tuberculin Test Reaction 11/18/2006  . Acute bronchitis 02/23/2010  . CHOLELITHIASIS 04/24/2010  . NEPHROLITHIASIS, HX OF 04/24/2010  . Polyarthralgia 06/09/2010  . Elevated sed rate 06/11/2010  . Positive ANA (antinuclear antibody) 06/11/2010  . Rheumatoid factor positive 06/11/2010  . Cancer     lung ca  . 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 SPONDYLOSIS, CERVICAL, WITH RADICULOPATHY 01/13/2009  . Cervical radiculopathy 09/28/2010  . Lumbar radiculopathy 09/28/2010    Past Surgical History  Procedure Laterality Date  . Abdominal hysterectomy    . Tubal ligation    . Tumor removal  08/06/08    from lungs  . Anterior cervical decomp/discectomy fusion  01/15/2011    Procedure: ANTERIOR CERVICAL DECOMPRESSION/DISCECTOMY FUSION 2 LEVELS;  Surgeon: Kathaleen Maser Pool;  Location: [facility];  Service: Neurosurgery;  Laterality: N/A;  Cervical Three-Four, Cervical Four-Five Anterior Cervical Decompression Fusion   . Colonscopy    . Nose surgery    . Laparoscopic appendectomy N/A 05/25/2012    Procedure: APPENDECTOMY LAPAROSCOPIC  CONVERTED TO  OPEN APPENDECTOMY, exploratory laparatomy;  Surgeon: Adolph Pollack, MD;  Location: [facility]  ORS;  Service: General;  Laterality: N/A;  . Bowel resection N/A 05/25/2012    Procedure: SMALL BOWEL RESECTION;  Surgeon: Adolph Pollack, MD;  Location: [facility] ORS;  Service: General;  Laterality: N/A;  . Laparoscopic lysis of adhesions N/A 05/25/2012    Procedure: LAPAROSCOPIC LYSIS OF ADHESIONS;  Surgeon: Adolph Pollack, MD;  Location: [facility];  Service: General;  Laterality: N/A;    Ian Malkin RD, LDN Inpatient Clinical Dietitian Pager: [phone] After Hours Pager: [phone]

## 2012-06-02 NOTE — Progress Notes (Signed)
CSW still working on obtaining [employer] authorization. CSW will follow-up Monday. [facility] aware.   Unice Bailey, LCSW [facility] Clinical Social Worker cell #: [phone]

## 2012-06-03 ENCOUNTER — Inpatient Hospital Stay (HOSPITAL_COMMUNITY): Payer: Medicare Other

## 2012-06-03 LAB — BASIC METABOLIC PANEL
CO2: 29 mEq/L (ref 19–32)
Chloride: 99 mEq/L (ref 96–112)
Creatinine, Ser: 1.16 mg/dL — ABNORMAL HIGH (ref 0.50–1.10)
GFR calc Af Amer: 54 mL/min — ABNORMAL LOW (ref >90)
Potassium: 4.3 mEq/L (ref 3.5–5.1)

## 2012-06-03 MED ORDER — FUROSEMIDE 20 MG PO TABS
20.0000 mg | ORAL_TABLET | Freq: Every day | ORAL | Status: DC
  Administered 2012-06-03 – 2012-06-04 (×2): 20 mg via ORAL
  Filled 2012-06-03 (×3): qty 1

## 2012-06-03 NOTE — Progress Notes (Signed)
Physical Therapy Treatment Patient Details Name: [name] MRN: [id number] DOB: Feb 20, 1940 Today's Date: 06/03/2012 Time: 4696-2952 PT Time Calculation (min): 23 min  PT Assessment / Plan / Recommendation Comments on Treatment Session       Follow Up Recommendations  Supervision/Assistance - 24 hour;SNF     Does the patient have the potential to tolerate intense rehabilitation     Barriers to Discharge        Equipment Recommendations  Rolling walker with 5" wheels    Recommendations for Other Services    Frequency Min 3X/week   Plan Discharge plan remains appropriate    Precautions / Restrictions Precautions Precautions: Fall Precaution Comments: abdominal surgery. monitor sats Restrictions Weight Bearing Restrictions: No   Pertinent Vitals/Pain 7/10 abdomen with activity    Mobility  Bed Mobility Bed Mobility: Supine to Sit Left Sidelying to Sit: HOB elevated;4: Min assist Details for Bed Mobility Assistance: HOB 60 degrees. Assist for trunk to upright and used bedpad to scoot, position at EOB Transfers Transfers: Sit to Stand;Stand to Sit;Stand Pivot Transfers Sit to Stand: 4: Min assist;From bed;From chair/3-in-1 Stand to Sit: 4: Min assist;To chair/3-in-1;To bed Stand Pivot Transfers: 4: Min assist Details for Transfer Assistance: Assist to rise, stabilize, control descent. VCS safety, technique, hand placement. Stand pivot without walker, bed >BSC Ambulation/Gait Ambulation/Gait Assistance: 4: Min assist Ambulation Distance (Feet): 30 Feet (10'x1, 30' x 1) Assistive device: Rolling walker Ambulation/Gait Assistance Details: Slow gait speed. Assist to stabilize throughout ambulation. O2 sats 94% on RA.Dyspnea 2-3/4 Gait Pattern: Decreased stride length;Decreased step length - right;Decreased step length - left;Trunk flexed    Exercises     PT Diagnosis:    PT Problem List:   PT Treatment Interventions:     PT Goals Acute Rehab PT Goals Pt will go  Supine/Side to Sit: with modified independence PT Goal: Supine/Side to Sit - Progress: Progressing toward goal Pt will go Sit to Stand: with modified independence PT Goal: Sit to Stand - Progress: Progressing toward goal Pt will go Stand to Sit: with modified independence PT Goal: Stand to Sit - Progress: Progressing toward goal Pt will Ambulate: 51 - 150 feet;with modified independence;with least restrictive assistive device PT Goal: Ambulate - Progress: Progressing toward goal  Visit Information  Last PT Received On: 06/03/12 Assistance Needed: +1    Subjective Data  Subjective: Im trying to do as much as I can. Im just a little slow Patient Stated Goal: to be able to walk   Cognition  Cognition Arousal/Alertness: Awake/alert Behavior During Therapy: WFL for tasks assessed/performed Overall Cognitive Status: Within Functional Limits for tasks assessed    Balance     End of Session PT - End of Session Equipment Utilized During Treatment: Gait belt Activity Tolerance: Patient limited by fatigue;Patient limited by pain Patient left: in chair;with nursing in room   GP     Rebeca Alert, MPT Pager: [phone]

## 2012-06-03 NOTE — Progress Notes (Signed)
9 Days Post-Op  Subjective: Reports bilateral leg discomfort, pain with dressing changes and some difficulty sleeping Tolerating PO and having BM's  Objective: Vital signs in last 24 hours: Temp:  [98.3 F (36.8 C)-99.2 F (37.3 C)] 98.9 F (37.2 C) (04/19 0505) Pulse Rate:  [80-87] 81 (04/19 0505) Resp:  [17-20] 18 (04/19 0505) BP: (125-144)/(42-50) 125/42 mmHg (04/19 0505) SpO2:  [90 %-98 %] 90 % (04/19 0505) Weight:  [173 lb 15.1 oz (78.9 kg)] 173 lb 15.1 oz (78.9 kg) (04/19 0500) Last BM Date: 05/31/12  Intake/Output from previous day: 04/18 0701 - 04/19 0700 In: 120 [P.O.:120] Out: 1800 [Urine:1800] Intake/Output this shift: Total I/O In: 120 [P.O.:120] Out: 300 [Urine:300]  Abdomen soft, wound clean, other incisions healing well Non distended, non tender Legs without swelling or tenderness  Lab Results:   Recent Labs  05/31/12 1802 06/01/12 0507  WBC  --  9.1  HGB 9.4* 10.3*  HCT 27.6* 30.5*  PLT  --  240   BMET  Recent Labs  06/02/12 0430 06/03/12 0516  NA 135 136  K 3.3* 4.3  CL 98 99  CO2 27 29  GLUCOSE 76 83  BUN 8 12  CREATININE 1.06 1.16*  CALCIUM 8.4 8.6   PT/INR No results found for this basename: LABPROT, INR,  in the last 72 hours ABG No results found for this basename: PHART, PCO2, PO2, HCO3,  in the last 72 hours  Studies/Results: Ct Chest W Contrast  06/01/2012  *RADIOLOGY REPORT*  Clinical Data: Follow-up abnormal chest x-ray.  Shortness of breath.  CT CHEST WITH CONTRAST  Technique:  Multidetector CT imaging of the chest was performed following the standard protocol during bolus administration of intravenous contrast.  Contrast: 80mL OMNIPAQUE IOHEXOL 300 MG/ML  SOLN  Comparison: Chest x-ray 06/01/2012 and chest CT 09/20/2011.  Findings: The chest wall is unremarkable.  No breast masses, supraclavicular or axillary adenopathy.  The thyroid gland appears normal.  The bony thorax is intact.  No destructive bone lesions or spinal canal  compromise.  The heart is mildly enlarged but stable.  Stable coronary artery calcifications and dense aortic calcifications.  No aortic aneurysm or dissection.  There are scattered mediastinal and hilar lymph nodes but these appear relatively stable.  The esophagus is grossly normal.  There is a small hiatal hernia.  Examination of the lung parenchyma demonstrates patchy areas of subpleural atelectasis and minimal peripheral interstitial lung disease.  There is a small rim of left pleural fluid on the left. No infiltrates, masses or worrisome nodules.  The bony structures are unremarkable. The upper abdomen demonstrates cholelithiasis and a left renal calculus.  IMPRESSION:  1.  Small left pleural effusion. 2.  Patchy areas of subpleural atelectasis and probable peripheral and basilar interstitial lung disease. 3.  No worrisome mass lesions or pulmonary nodules.  Left upper lobe scarring changes are noted. 4.  Cardiac enlargement with dense coronary artery calcifications.   Original Report Authenticated By: Rudie Meyer, M.D.     Anti-infectives: Anti-infectives   Start     Dose/Rate Route Frequency Ordered Stop   06/01/12 2359  vancomycin (VANCOCIN) IVPB 750 mg/150 ml premix  Status:  Discontinued     750 mg 150 mL/hr over 60 Minutes Intravenous Every 12 hours 06/01/12 1008 06/02/12 1014   06/01/12 2000  aztreonam (AZACTAM) 1 g in dextrose 5 % 50 mL IVPB  Status:  Discontinued     1 g 100 mL/hr over 30 Minutes Intravenous Every 8 hours  06/01/12 1008 06/02/12 1014   06/01/12 1100  aztreonam (AZACTAM) 1 g in dextrose 5 % 50 mL IVPB     1 g 100 mL/hr over 30 Minutes Intravenous  Once 06/01/12 1008 06/01/12 1223   06/01/12 1100  vancomycin (VANCOCIN) IVPB 750 mg/150 ml premix     750 mg 150 mL/hr over 60 Minutes Intravenous  Once 06/01/12 1008 06/01/12 1328   05/26/12 0600  ertapenem (INVANZ) 1 g in sodium chloride 0.9 % 50 mL IVPB  Status:  Discontinued     1 g 100 mL/hr over 30 Minutes  Intravenous Every 24 hours 05/25/12 0733 05/25/12 1730   05/26/12 0600  ertapenem (INVANZ) 1 g in sodium chloride 0.9 % 50 mL IVPB  Status:  Discontinued     1 g 100 mL/hr over 30 Minutes Intravenous Every 24 hours 05/25/12 1729 05/31/12 1338   05/25/12 0545  ertapenem (INVANZ) 1 g in sodium chloride 0.9 % 50 mL IVPB     1 g 100 mL/hr over 30 Minutes Intravenous  Once 05/25/12 0540 05/25/12 0716      Assessment/Plan: s/p Procedure(s): APPENDECTOMY LAPAROSCOPIC  CONVERTED TO  OPEN APPENDECTOMY, exploratory laparatomy (N/A) SMALL BOWEL RESECTION (N/A) LAPAROSCOPIC LYSIS OF ADHESIONS (N/A)  Continuing slow improvement Continue wound care Staples out next 24 to 48 hours  LOS: 9 days    Tavaria Mackins A 06/03/2012

## 2012-06-03 NOTE — Progress Notes (Signed)
[facility] PROGRESS NOTE  [name] ZOX:[id number] DOB: [birth year] DOA: 05/25/2012 PCP: [name], MD  Brief Narrative: [name] is a 72 y.o. female with h/o COPD, hypertension, hyperlipidemia, chronic kidney disease with a baseline creatinine 1.4-1.6, presents a chief complaint of abdominal pain for the past for 5 days. She was found to have acute cholecystitis, subsequently underwent ex lap, Appy, lysis of adhesions and small bowel resection. Her post operative course was significant for pain, tachycardia, anemia.   Assessment/Plan:  1. Perforated Gangrenous appendicitis: ex lap, lysis of adhesions, appendectomy, small bowel resection 4/10 -POD #8 -NGT removed 4/13, cut down IVF, started on clears, advancing diet as tolerated.  -per [facility] - pt denies nausea or vomiting. Persistent tender abdomen.   2. COPD: stable, albuterol nebs PRN -incentive spirometry Q1H  3. H/o grade 1 diastolic dysfunction/mild AS/AI/ acute on chronic diastolic heart failure - probably from IV fluids given post op.  Last eCHO was done less than 6 months ago, showed good LVEF and diastolic dysfunction. Repeat CXR shows improvement in pulmonary congestion. Decreased the lasix dose and changed to po lasix .  I/O last 3 completed shifts: In: 120 [P.O.:120] Out: 2650 [Urine:2650] Total I/O In: 120 [P.O.:120] Out: 1100 [Urine:1100]  - CXR showed pulmonary congestion, with left infiltrate and nodular densities. A CT chest with contrast ordered to evaluate for recurrence of the lung cancer, which was negative for infiltrates and recurrent lung cancer. We will stop the antibiotics. . - PT/OT eval recommended SNF placement.    4. HTN: -Was on IV hydralazine and metoprolol , change to PO Toprol XL -slowly resume home antihypertensives  5. Tachycardia:improved -post op pain and Beta blocker withdrawal -resume PO Betablocker  5. H/o lung CA -s/p left upper lobe lobectomy in 2010 - rpeat CT chest  with contrast ordered.   6. 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 Anemia: post op/chronic disease and hemodilution. Stool for occult blood negative.  -transfuse if <7 or symptomatic -received 2 units prbc transfusion yesterday and H&H has improved to 10.3/30.5.   DVT proph:lovenox   PT/OT OOB  Code Status: DNR/DNI  Family Communication: daughter at bedside Disposition Plan: SNF when stable    Consultants:  [facility]  [facility]  [facility]  Procedures: PROCEDURE: Laparoscopy, exploratory laparotomy, lysis of adhesions, appendectomy, small bowel resection 4/10   Antibiotics:  Ertapenem 4/10  HPI/Subjective: Feels better than yesterday and sitting int he chair.   Objective: Filed Vitals:   06/02/12 2140 06/03/12 0500 06/03/12 0505 06/03/12 0902  BP: 141/50  125/42   Pulse: 87  81   Temp: 99.2 F (37.3 C)  98.9 F (37.2 C)   TempSrc: Oral  Oral   Resp: 20  18   Height:      Weight:  78.9 kg (173 lb 15.1 oz)    SpO2: 93%  90% 90%    Intake/Output Summary (Last 24 hours) at 06/03/12 1331 Last data filed at 06/03/12 1220  Gross per 24 hour  Intake    120 ml  Output   2700 ml  Net  -2580 ml   Filed Weights   05/25/12 1002 06/03/12 0500  Weight: 74.2 kg (163 lb 9.3 oz) 78.9 kg (173 lb 15.1 oz)    Exam:   General:  AAOx3  Cardiovascular: S1S2/RRR  Respiratory: decreased BS at bases  Abdomen: soft, distended, tenderness as expected, dressing, BS present but diminished  Ext: no edema c/c  Neuro:  moves all extremities   Data Reviewed: Basic Metabolic Panel:  Recent Labs Lab 05/28/12 0601 05/29/12 0814 05/30/12 0454 05/31/12 0435 05/31/12 1802 06/02/12 0430 06/03/12 0516  NA 139 139 138 138 135 135 136  K 4.3 4.1 3.8 3.4* 4.0 3.3* 4.3  CL 104 104 103 103 99 98 99  CO2 21 26 27 29 26 27 29   GLUCOSE 71 115* 91 83 104* 76 83  BUN 23 15 12 9 8 8 12   CREATININE 1.27* 0.95 1.00 1.01 0.88 1.06 1.16*   CALCIUM 8.6 8.6 8.2* 8.1* 8.3* 8.4 8.6  MG  --  1.8  --   --   --   --   --    Liver Function Tests: No results found for this basename: AST, ALT, ALKPHOS, BILITOT, PROT, ALBUMIN,  in the last 168 hours No results found for this basename: LIPASE, AMYLASE,  in the last 168 hours No results found for this basename: AMMONIA,  in the last 168 hours CBC:  Recent Labs Lab 05/28/12 0601 05/30/12 0454 05/31/12 0435 05/31/12 1802 06/01/12 0507  WBC 11.2* 8.8 7.1  --  9.1  HGB 8.7* 7.5* 6.5* 9.4* 10.3*  HCT 26.4* 21.8* 19.9* 27.6* 30.5*  MCV 100.4* 99.5 99.0  --  93.8  PLT 165 205 206  --  240   Cardiac Enzymes: No results found for this basename: CKTOTAL, CKMB, CKMBINDEX, TROPONINI,  in the last 168 hours BNP (last 3 results)  Recent Labs  06/02/12 0435  PROBNP 3699.0*   CBG: No results found for this basename: GLUCAP,  in the last 168 hours  Recent Results (from the past 240 hour(s))  SURGICAL PCR SCREEN     Status: None   Collection Time    05/25/12 10:52 AM      Result Value Range Status   MRSA, PCR NEGATIVE  NEGATIVE Final   Staphylococcus aureus NEGATIVE  NEGATIVE Final   Comment:            The Xpert SA Assay (FDA     approved for NASAL specimens     in patients over 53 years of age),     is one component of     a comprehensive surveillance     program.  Test performance has     been validated by [employer] for patients greater     than or equal to 6 year old.     It is not intended     to diagnose infection nor to     guide or monitor treatment.     Studies: Dg Chest 2 View  06/03/2012  *RADIOLOGY REPORT*  Clinical Data: Cough, weakness, pulmonary congestion  CHEST - 2 VIEW  Comparison: CT chest and chest radiograph dated 06/01/2012  Findings: Cardiomegaly with mild interstitial edema.  Suspected small bilateral pleural effusions.  Associated lower lobe opacities, likely atelectasis, pneumonia not excluded.  Mild degenerative changes of the visualized  thoracolumbar spine. Cervical spine fixation hardware.  IMPRESSION: Cardiomegaly with mild interstitial edema and suspected small bilateral pleural effusions.  Associated lower lobe opacities, likely atelectasis, pneumonia not excluded.   Original Report Authenticated By: Charline Bills, M.D.     Scheduled Meds: . enoxaparin (LOVENOX) injection  40 mg Subcutaneous Q24H  . feeding supplement  237 mL Oral Q24H  . furosemide  40 mg Intravenous Once  . furosemide  20 mg Oral Daily  . ipratropium  0.5 mg Nebulization TID  . levalbuterol  0.63 mg Nebulization TID  . metoprolol succinate  25 mg Oral Daily  . multivitamin with minerals  1 tablet Oral Daily  . pantoprazole  40 mg Oral Daily   Continuous Infusions: . sodium chloride 20 mL/hr at 06/01/12 1051    Principal Problem:   Acute perforated appendicitis with generalized peritonitis Active Problems:   HYPERLIPIDEMIA   AORTIC STENOSIS/ INSUFFICIENCY, NON-RHEUMATIC   C O P D   Stricture and stenosis of esophagus   Heart palpitations   CKD (chronic kidney disease)   Lung cancer   COPD, moderate   Acute diastolic congestive heart failure    Time spent:    [facility]  [facility] Pager [phone] If 7PM-7AM, please contact night-coverage at [email or web address], password [facility] 06/03/2012, 1:31 PM  LOS: 9 days

## 2012-06-04 LAB — BASIC METABOLIC PANEL
CO2: 28 mEq/L (ref 19–32)
Calcium: 9.4 mg/dL (ref 8.4–10.5)
Chloride: 99 mEq/L (ref 96–112)
Glucose, Bld: 105 mg/dL — ABNORMAL HIGH (ref 70–99)
Sodium: 137 mEq/L (ref 135–145)

## 2012-06-04 MED ORDER — ONDANSETRON HCL 4 MG PO TABS
4.0000 mg | ORAL_TABLET | Freq: Four times a day (QID) | ORAL | Status: DC | PRN
  Administered 2012-06-04 – 2012-06-06 (×3): 4 mg via ORAL
  Filled 2012-06-04 (×3): qty 1

## 2012-06-04 NOTE — Progress Notes (Signed)
[facility] PROGRESS NOTE  ARNESHIA ADE OZH:[id number] DOB: 1940-09-12 DOA: 05/25/2012 PCP: [name], MD  Brief Narrative: [name] is a 72 y.o. female with h/o COPD, hypertension, hyperlipidemia, chronic kidney disease with a baseline creatinine 1.4-1.6, presents a chief complaint of abdominal pain for the past for 5 days. She was found to have acute cholecystitis, subsequently underwent ex lap, Appy, lysis of adhesions and small bowel resection. Her post operative course was significant for pain, tachycardia, anemia.   Assessment/Plan:  1. Perforated Gangrenous appendicitis: ex lap, lysis of adhesions, appendectomy, small bowel resection 4/10 -POD #9 -NGT removed 4/13, cut down IVF, started on clears, advancing diet as tolerated.  -per [facility], removal of staples - pt denies nausea or vomiting. Persistent tender abdomen.   2. COPD: stable, albuterol nebs PRN -incentive spirometry Q1H  3. H/o grade 1 diastolic dysfunction/mild AS/AI/ acute on chronic diastolic heart failure - probably from IV fluids given post op.  Last eCHO was done less than 6 months ago, showed good LVEF and diastolic dysfunction. Repeat CXR shows improvement in pulmonary congestion. Decreased the lasix dose and changed to po lasix .  I/O last 3 completed shifts: In: 2160 [P.O.:2160] Out: 3625 [Urine:3625] Total I/O In: 120 [P.O.:120] Out: -   - CXR showed pulmonary congestion, with left infiltrate and nodular densities. A CT chest with contrast ordered to evaluate for recurrence of the lung cancer, which was negative for infiltrates and recurrent lung cancer. We will stop the antibiotics. . - PT/OT eval recommended SNF placement.    4. HTN: -Was on IV hydralazine and metoprolol , change to PO Toprol XL -slowly resume home antihypertensives  5. Tachycardia:improved -post op pain and Beta blocker withdrawal -resume PO Betablocker  5. H/o lung CA -s/p left upper lobe lobectomy in 2010 - rpeat  CT chest with contrast ordered.   6. 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 Anemia: post op/chronic disease and hemodilution. Stool for occult blood negative.  -transfuse if <7 or symptomatic -received 2 units prbc transfusion yesterday and H&H has improved to 10.3/30.5.   DVT proph:lovenox   PT/OT OOB  Code Status: DNR/DNI  Family Communication: daughter at bedside Disposition Plan: SNF tomorrow    Consultants:  [facility]  [facility]  [facility]  Procedures: PROCEDURE: Laparoscopy, exploratory laparotomy, lysis of adhesions, appendectomy, small bowel resection 4/10   Antibiotics:  Ertapenem 4/10  HPI/Subjective: Feels better than yesterday  D/ced telemetry.  Objective: Filed Vitals:   06/03/12 2134 06/04/12 0540 06/04/12 0814 06/04/12 1405  BP: 134/43 121/48  140/64  Pulse: 89 84  81  Temp: 98.7 F (37.1 C) 98.8 F (37.1 C)  97.6 F (36.4 C)  TempSrc: Oral Oral  Oral  Resp: 18 18  16   Height:      Weight:  78.835 kg (173 lb 12.8 oz)    SpO2: 96% 91% 90% 95%    Intake/Output Summary (Last 24 hours) at 06/04/12 1435 Last data filed at 06/04/12 1406  Gross per 24 hour  Intake   2040 ml  Output   1325 ml  Net    715 ml   Filed Weights   05/25/12 1002 06/03/12 0500 06/04/12 0540  Weight: 74.2 kg (163 lb 9.3 oz) 78.9 kg (173 lb 15.1 oz) 78.835 kg (173 lb 12.8 oz)    Exam:   General:  AAOx3  Cardiovascular: S1S2/RRR  Respiratory: decreased BS at bases  Abdomen: soft, distended, tenderness as expected, dressing,  BS present but diminished  Ext: no edema c/c  Neuro: moves all extremities   Data Reviewed: Basic Metabolic Panel:  Recent Labs Lab 05/29/12 0814  05/31/12 0435 05/31/12 1802 06/02/12 0430 06/03/12 0516 06/04/12 0509  NA 139  < > 138 135 135 136 137  K 4.1  < > 3.4* 4.0 3.3* 4.3 4.2  CL 104  < > 103 99 98 99 99  CO2 26  < > 29 26 27 29 28   GLUCOSE 115*  < > 83 104* 76 83 105*   BUN 15  < > 9 8 8 12 11   CREATININE 0.95  < > 1.01 0.88 1.06 1.16* 1.21*  CALCIUM 8.6  < > 8.1* 8.3* 8.4 8.6 9.4  MG 1.8  --   --   --   --   --   --   < > = values in this interval not displayed. Liver Function Tests: No results found for this basename: AST, ALT, ALKPHOS, BILITOT, PROT, ALBUMIN,  in the last 168 hours No results found for this basename: LIPASE, AMYLASE,  in the last 168 hours No results found for this basename: AMMONIA,  in the last 168 hours CBC:  Recent Labs Lab 05/30/12 0454 05/31/12 0435 05/31/12 1802 06/01/12 0507  WBC 8.8 7.1  --  9.1  HGB 7.5* 6.5* 9.4* 10.3*  HCT 21.8* 19.9* 27.6* 30.5*  MCV 99.5 99.0  --  93.8  PLT 205 206  --  240   Cardiac Enzymes: No results found for this basename: CKTOTAL, CKMB, CKMBINDEX, TROPONINI,  in the last 168 hours BNP (last 3 results)  Recent Labs  06/02/12 0435  PROBNP 3699.0*   CBG: No results found for this basename: GLUCAP,  in the last 168 hours  No results found for this or any previous visit (from the past 240 hour(s)).   Studies: Dg Chest 2 View  06/03/2012  *RADIOLOGY REPORT*  Clinical Data: Cough, weakness, pulmonary congestion  CHEST - 2 VIEW  Comparison: CT chest and chest radiograph dated 06/01/2012  Findings: Cardiomegaly with mild interstitial edema.  Suspected small bilateral pleural effusions.  Associated lower lobe opacities, likely atelectasis, pneumonia not excluded.  Mild degenerative changes of the visualized thoracolumbar spine. Cervical spine fixation hardware.  IMPRESSION: Cardiomegaly with mild interstitial edema and suspected small bilateral pleural effusions.  Associated lower lobe opacities, likely atelectasis, pneumonia not excluded.   Original Report Authenticated By: Charline Bills, M.D.     Scheduled Meds: . enoxaparin (LOVENOX) injection  40 mg Subcutaneous Q24H  . feeding supplement  237 mL Oral Q24H  . furosemide  40 mg Intravenous Once  . furosemide  20 mg Oral Daily  .  ipratropium  0.5 mg Nebulization TID  . levalbuterol  0.63 mg Nebulization TID  . metoprolol succinate  25 mg Oral Daily  . multivitamin with minerals  1 tablet Oral Daily  . pantoprazole  40 mg Oral Daily   Continuous Infusions: . sodium chloride 20 mL/hr at 06/01/12 1051    Principal Problem:   Acute perforated appendicitis with generalized peritonitis Active Problems:   HYPERLIPIDEMIA   AORTIC STENOSIS/ INSUFFICIENCY, NON-RHEUMATIC   C O P D   Stricture and stenosis of esophagus   Heart palpitations   CKD (chronic kidney disease)   Lung cancer   COPD, moderate   Acute diastolic congestive heart failure    Time spent:    [facility]  [facility] Pager [phone] If 7PM-7AM, please contact night-coverage at [email or web address],  password [facility] 06/04/2012, 2:35 PM  LOS: 10 days

## 2012-06-04 NOTE — Progress Notes (Signed)
Correction to verbal order to DC Dilaudid, it was given per Dr. Blake Divine, not Dr. Izola Price.

## 2012-06-04 NOTE — Progress Notes (Signed)
10 Days Post-Op  Subjective: Doing better with po intake  Objective: Vital signs in last 24 hours: Temp:  [98.4 F (36.9 C)-98.8 F (37.1 C)] 98.8 F (37.1 C) (04/20 0540) Pulse Rate:  [84-89] 84 (04/20 0540) Resp:  [18] 18 (04/20 0540) BP: (121-154)/(43-57) 121/48 mmHg (04/20 0540) SpO2:  [90 %-96 %] 91 % (04/20 0540) Weight:  [173 lb 12.8 oz (78.835 kg)] 173 lb 12.8 oz (78.835 kg) (04/20 0540) Last BM Date: 05/31/12  Intake/Output from previous day: 04/19 0701 - 04/20 0700 In: 2160 [P.O.:2160] Out: 2725 [Urine:2725] Intake/Output this shift:    Abdomen stable, soft, incisions ok  Lab Results:  No results found for this basename: WBC, HGB, HCT, PLT,  in the last 72 hours BMET  Recent Labs  06/03/12 0516 06/04/12 0509  NA 136 137  K 4.3 4.2  CL 99 99  CO2 29 28  GLUCOSE 83 105*  BUN 12 11  CREATININE 1.16* 1.21*  CALCIUM 8.6 9.4   PT/INR No results found for this basename: LABPROT, INR,  in the last 72 hours ABG No results found for this basename: PHART, PCO2, PO2, HCO3,  in the last 72 hours  Studies/Results: Dg Chest 2 View  06/03/2012  *RADIOLOGY REPORT*  Clinical Data: Cough, weakness, pulmonary congestion  CHEST - 2 VIEW  Comparison: CT chest and chest radiograph dated 06/01/2012  Findings: Cardiomegaly with mild interstitial edema.  Suspected small bilateral pleural effusions.  Associated lower lobe opacities, likely atelectasis, pneumonia not excluded.  Mild degenerative changes of the visualized thoracolumbar spine. Cervical spine fixation hardware.  IMPRESSION: Cardiomegaly with mild interstitial edema and suspected small bilateral pleural effusions.  Associated lower lobe opacities, likely atelectasis, pneumonia not excluded.   Original Report Authenticated By: Charline Bills, M.D.     Anti-infectives: Anti-infectives   Start     Dose/Rate Route Frequency Ordered Stop   06/01/12 2359  vancomycin (VANCOCIN) IVPB 750 mg/150 ml premix  Status:   Discontinued     750 mg 150 mL/hr over 60 Minutes Intravenous Every 12 hours 06/01/12 1008 06/02/12 1014   06/01/12 2000  aztreonam (AZACTAM) 1 g in dextrose 5 % 50 mL IVPB  Status:  Discontinued     1 g 100 mL/hr over 30 Minutes Intravenous Every 8 hours 06/01/12 1008 06/02/12 1014   06/01/12 1100  aztreonam (AZACTAM) 1 g in dextrose 5 % 50 mL IVPB     1 g 100 mL/hr over 30 Minutes Intravenous  Once 06/01/12 1008 06/01/12 1223   06/01/12 1100  vancomycin (VANCOCIN) IVPB 750 mg/150 ml premix     750 mg 150 mL/hr over 60 Minutes Intravenous  Once 06/01/12 1008 06/01/12 1328   05/26/12 0600  ertapenem (INVANZ) 1 g in sodium chloride 0.9 % 50 mL IVPB  Status:  Discontinued     1 g 100 mL/hr over 30 Minutes Intravenous Every 24 hours 05/25/12 0733 05/25/12 1730   05/26/12 0600  ertapenem (INVANZ) 1 g in sodium chloride 0.9 % 50 mL IVPB  Status:  Discontinued     1 g 100 mL/hr over 30 Minutes Intravenous Every 24 hours 05/25/12 1729 05/31/12 1338   05/25/12 0545  ertapenem (INVANZ) 1 g in sodium chloride 0.9 % 50 mL IVPB     1 g 100 mL/hr over 30 Minutes Intravenous  Once 05/25/12 0540 05/25/12 0716      Assessment/Plan: s/p Procedure(s): APPENDECTOMY LAPAROSCOPIC  CONVERTED TO  OPEN APPENDECTOMY, exploratory laparatomy (N/A) SMALL BOWEL RESECTION (N/A) LAPAROSCOPIC LYSIS  OF ADHESIONS (N/A)   Continuing current care Working on disposition  LOS: 10 days    Alejandro Adcox A 06/04/2012

## 2012-06-05 ENCOUNTER — Inpatient Hospital Stay (HOSPITAL_COMMUNITY): Payer: Medicare Other

## 2012-06-05 LAB — BASIC METABOLIC PANEL
BUN: 13 mg/dL (ref 6–23)
Creatinine, Ser: 1.35 mg/dL — ABNORMAL HIGH (ref 0.50–1.10)
GFR calc Af Amer: 45 mL/min — ABNORMAL LOW (ref >90)
GFR calc non Af Amer: 38 mL/min — ABNORMAL LOW (ref >90)

## 2012-06-05 LAB — COMPREHENSIVE METABOLIC PANEL
AST: 12 U/L (ref 0–37)
Albumin: 2.7 g/dL — ABNORMAL LOW (ref 3.5–5.2)
BUN: 14 mg/dL (ref 6–23)
Calcium: 9.3 mg/dL (ref 8.4–10.5)
Creatinine, Ser: 1.37 mg/dL — ABNORMAL HIGH (ref 0.50–1.10)
GFR calc non Af Amer: 38 mL/min — ABNORMAL LOW (ref >90)

## 2012-06-05 LAB — PRO B NATRIURETIC PEPTIDE: Pro B Natriuretic peptide (BNP): 1043 pg/mL — ABNORMAL HIGH (ref 0–125)

## 2012-06-05 MED ORDER — ENSURE COMPLETE PO LIQD: 237.0000 mL | ORAL | Status: DC

## 2012-06-05 MED ORDER — OXYCODONE HCL 5 MG PO TABS: 5.0000 mg | ORAL_TABLET | Freq: Four times a day (QID) | ORAL | Status: DC | PRN

## 2012-06-05 MED ORDER — FUROSEMIDE 40 MG PO TABS
40.0000 mg | ORAL_TABLET | Freq: Two times a day (BID) | ORAL | Status: DC
  Administered 2012-06-05 – 2012-06-06 (×2): 40 mg via ORAL
  Filled 2012-06-05 (×4): qty 1

## 2012-06-05 MED ORDER — ZOLPIDEM TARTRATE 5 MG PO TABS: 5.0000 mg | ORAL_TABLET | Freq: Every evening | ORAL | Status: DC | PRN

## 2012-06-05 MED ORDER — ONDANSETRON HCL 4 MG PO TABS: 4.0000 mg | ORAL_TABLET | Freq: Four times a day (QID) | ORAL | Status: DC | PRN

## 2012-06-05 NOTE — Discharge Summary (Signed)
Physician Discharge Summary  LADORA OSTERBERG HQI:[id number] DOB: 01-20-1941 DOA: 05/25/2012  PCP: [name], MD  Admit date: 05/25/2012 Discharge date: 06/05/2012    Recommendations for Outpatient Follow-up:  1. Follow up with BMP in 2 days to check creatinine.  2. Follow up with SURGERY as recommended.  3. Follow up with PCP in 1 one week.   Discharge Diagnoses:  Principal Problem:   Acute perforated appendicitis with generalized peritonitis Active Problems:   HYPERLIPIDEMIA   AORTIC STENOSIS/ INSUFFICIENCY, NON-RHEUMATIC   C O P D   Stricture and stenosis of esophagus   Heart palpitations   CKD (chronic kidney disease)   Lung cancer   COPD, moderate   Acute diastolic congestive heart failure   Discharge Condition: fair  Diet recommendation: low salt diet  Filed Weights   05/25/12 1002 06/03/12 0500 06/04/12 0540  Weight: 74.2 kg (163 lb 9.3 oz) 78.9 kg (173 lb 15.1 oz) 78.835 kg (173 lb 12.8 oz)    History of present illness:   [name] is a 72 y.o. 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 She describes abdominal pain as initially epigastric, now going to the right upper and right lower quadrants. She denies any fever, chills. She had few episodes of vomiting. She denies any frank diarrhea, however endorses an increasing frequency of stools. She initiated she repeated her abdominal pain to a food that she ate, however nobody also the family got sick. The abdominal pain got to the point where it was so severe and unrelenting that she decided to come to the emergency room. In the ED showed a CAT scan of the abdomen which showed acute  appendicitis. Surgery has been consulted for appendectomy. Hospitalist service was asked for admission given multiple comorbidities  She was found to have acute cholecystitis, subsequently underwent ex lap, Appy, lysis of adhesions and small bowel resection.  Her post operative course was significant for pain, tachycardia, anemia.   Hospital Course:   1. Perforated Gangrenous appendicitis: ex lap, lysis of adhesions, appendectomy, small bowel resection 4/10  POD #10 , NGT removed 4/13, cut down IVF, started on clears, advancing diet as tolerated. She is able to tolerate diet without any nausea or vomiting.   removal of staples per CCS.  2. COPD: stable, albuterol nebs PRN    3. H/o grade 1 diastolic dysfunction/mild AS/AI/ acute on chronic diastolic heart failure  - probably from IV fluids given post op. Last eCHO was done less than 6 months ago, showed good LVEF and diastolic dysfunction. Repeat CXR shows improvement in pulmonary congestion. Decreased the lasix dose and changed to po lasix and she can resume her home dose of lasix on discharge.  Initial CXR showed pulmonary congestion, with left infiltrate and nodular densities. A CT chest with contrast ordered to evaluate for recurrence of the lung cancer, which was negative for infiltrates and recurrent lung cancer.  - PT/OT eval recommended SNF placement.  I/O last 3 completed shifts: In: 1800 [P.O.:1800] Out: 1800 [Urine:1350; Emesis/NG output:450] Total I/O In: 360 [P.O.:360] Out: -      4. HTN:  -Was on IV hydralazine and metoprolol , change to PO Toprol XL  -  slowly resume home antihypertensives   5. Tachycardia:improved  -post op pain and Beta blocker withdrawal  -resume PO Betablocker   5. H/o lung CA  -s/p left upper lobe lobectomy in 2010  - rpeat CT chest with contrast ordered and negative for recurrence.   6. 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 Anemia: post op/chronic disease  and hemodilution. Stool for occult blood negative.  -transfuse if <7 or symptomatic  -received 2 units prbc transfusion yesterday and H&H has improved to 10.3/30.5.   Procedures: PROCEDURE: Laparoscopy, exploratory laparotomy, lysis of adhesions, appendectomy, small bowel resection 4/10   Consultations:  surgery  Discharge Exam: Filed Vitals:   06/04/12 1944 06/04/12 2220 06/05/12 0500 06/05/12 0854  BP:  135/44 132/47   Pulse:  95 88   Temp:  100.2 F (37.9 C) 99.4 F (37.4 C)   TempSrc:  Oral Oral   Resp:  16 16   Height:      Weight:      SpO2: 96% 93% 95% 92%    General: AAOx3  Cardiovascular: S1S2/RRR  Respiratory: decreased BS at bases Abdomen: soft, distended, tenderness as expected, dressing, BS present Ext: no edema c/c  Neuro: moves all extremities   Discharge Instructions   Future Appointments Provider Department Dept Phone   06/23/2012 10:50 AM [facility] [place]  IMAGING [phone]   Patient should wear two piece clothing and wear no powder or deodorant. Patient should arrive 15 minutes early.   09/20/2012 9:00 AM Mauri [place] [facility] MEDICAL ONCOLOGY [phone]   09/20/2012 9:30 AM Si Gaul, MD [facility] MEDICAL ONCOLOGY [phone]   11/20/2012 10:45 AM Corwin Levins, MD [facility] [phone]       Medication List    TAKE these medications       albuterol 108 (90 BASE) MCG/ACT inhaler  Commonly known as:  PROVENTIL HFA;VENTOLIN HFA  Inhale 1-2 puffs into the lungs 4 (four) times daily as needed. For wheezing or shortness of breath.     alendronate 70 MG tablet  Commonly known as:  FOSAMAX  Take 1 tablet (70 mg total) by mouth every 7 (seven) days. Take with a full glass of water on an empty stomach. Patient takes on Mondays.     ALPRAZolam 0.5 MG tablet  Commonly known as:  XANAX  Take 0.5 mg by mouth daily as needed for anxiety.     amLODipine 5 MG  tablet  Commonly known as:  NORVASC  Take 1 tablet (5 mg total) by mouth daily.     citalopram 10 MG tablet  Commonly known as:  CELEXA  Take 1 tablet (10 mg total) by mouth daily.     feeding supplement Liqd  Take 237 mLs by mouth daily.     furosemide 40 MG tablet  Commonly known as:  LASIX  Take 40 mg by mouth 2 (two) times daily.     HYDROcodone-acetaminophen 10-325 MG per tablet  Commonly known as:  NORCO  TAKE 1 TABLET BY MOUTH EVERY 6 HOURS AS NEEDED FOR PAIN     metoprolol succinate 25 MG 24 hr tablet  Commonly known as:  TOPROL-XL  Take 1 tablet (25 mg total) by mouth daily.     ondansetron 4 MG tablet  Commonly known as:  ZOFRAN  Take 1 tablet (4 mg total) by mouth every 6 (six) hours as needed.  oxyCODONE 5 MG immediate release tablet  Commonly known as:  Oxy IR/ROXICODONE  Take 1 tablet (5 mg total) by mouth every 6 (six) hours as needed.     pantoprazole 40 MG tablet  Commonly known as:  PROTONIX  TAKE 1 TABLET (40 MG TOTAL) BY MOUTH DAILY.     pravastatin 40 MG tablet  Commonly known as:  PRAVACHOL  Take 1 tablet (40 mg total) by mouth daily.     Vitamin D3 1000 UNITS Caps  Take 1 capsule by mouth daily.     zolpidem 5 MG tablet  Commonly known as:  AMBIEN  Take 1 tablet (5 mg total) by mouth at bedtime as needed. For sleep.          The results of significant diagnostics from this hospitalization (including imaging, microbiology, ancillary and laboratory) are listed below for reference.    Significant Diagnostic Studies: Dg Chest 2 View  06/03/2012  *RADIOLOGY REPORT*  Clinical Data: Cough, weakness, pulmonary congestion  CHEST - 2 VIEW  Comparison: CT chest and chest radiograph dated 06/01/2012  Findings: Cardiomegaly with mild interstitial edema.  Suspected small bilateral pleural effusions.  Associated lower lobe opacities, likely atelectasis, pneumonia not excluded.  Mild degenerative changes of the visualized thoracolumbar spine. Cervical  spine fixation hardware.  IMPRESSION: Cardiomegaly with mild interstitial edema and suspected small bilateral pleural effusions.  Associated lower lobe opacities, likely atelectasis, pneumonia not excluded.   Original Report Authenticated By: Charline Bills, M.D.    Dg Chest 2 View  06/01/2012  *RADIOLOGY REPORT*  Clinical Data: Shortness of breath, hypoxia, left side abdominal pain, history hypertension, smoking, lung cancer, COPD GERD, Barrett's esophagus  CHEST - 2 VIEW  Comparison: 05/25/2012  Findings: Enlargement of cardiac silhouette with pulmonary vascular congestion. Atherosclerotic calcification aorta. Surgical clips and AP window/left hilum question prior left upper lobe resection. Left basilar infiltrate. Atelectasis versus infiltrate right base. Linear scarring or subsegmental atelectasis in left upper lobe appearing slightly more nodular than on previous exams, cannot exclude developing pulmonary nodule. No gross pleural effusion or pneumothorax.  IMPRESSION: Enlargement of cardiac silhouette with pulmonary vascular congestion. Left basilar infiltrate. Right basilar atelectasis versus consolidation. Linear scarring versus chronic atelectasis in left upper lobe, appearing more nodular than on previous exams; CT chest recommended to exclude recurrent pulmonary tumor, exam with contrast if patient's renal function permits.   Original Report Authenticated By: Ulyses Southward, M.D.    Ct Chest W Contrast  06/01/2012  *RADIOLOGY REPORT*  Clinical Data: Follow-up abnormal chest x-ray.  Shortness of breath.  CT CHEST WITH CONTRAST  Technique:  Multidetector CT imaging of the chest was performed following the standard protocol during bolus administration of intravenous contrast.  Contrast: 80mL OMNIPAQUE IOHEXOL 300 MG/ML  SOLN  Comparison: Chest x-ray 06/01/2012 and chest CT 09/20/2011.  Findings: The chest wall is unremarkable.  No breast masses, supraclavicular or axillary adenopathy.  The thyroid gland  appears normal.  The bony thorax is intact.  No destructive bone lesions or spinal canal compromise.  The heart is mildly enlarged but stable.  Stable coronary artery calcifications and dense aortic calcifications.  No aortic aneurysm or dissection.  There are scattered mediastinal and hilar lymph nodes but these appear relatively stable.  The esophagus is grossly normal.  There is a small hiatal hernia.  Examination of the lung parenchyma demonstrates patchy areas of subpleural atelectasis and minimal peripheral interstitial lung disease.  There is a small rim of left pleural fluid on the left. No  infiltrates, masses or worrisome nodules.  The bony structures are unremarkable. The upper abdomen demonstrates cholelithiasis and a left renal calculus.  IMPRESSION:  1.  Small left pleural effusion. 2.  Patchy areas of subpleural atelectasis and probable peripheral and basilar interstitial lung disease. 3.  No worrisome mass lesions or pulmonary nodules.  Left upper lobe scarring changes are noted. 4.  Cardiac enlargement with dense coronary artery calcifications.   Original Report Authenticated By: Rudie Meyer, M.D.    Ct Abdomen Pelvis W Contrast  05/25/2012  *RADIOLOGY REPORT*  Clinical Data: Mid abdominal pain, nausea, vomiting and loose stools.  CT ABDOMEN AND PELVIS WITH CONTRAST  Technique:  Multidetector CT imaging of the abdomen and pelvis was performed following the standard protocol during bolus administration of intravenous contrast.  Contrast: 80mL OMNIPAQUE IOHEXOL 300 MG/ML  SOLN  Comparison: PET/CT performed 06/27/2008, and MRI of the lumbar spine performed 10/24/2010; abdominal ultrasound performed 03/09/2007  Findings: The visualized lung bases are clear.  There is mild nonspecific prominence of the intrahepatic biliary ducts.  The liver is otherwise unremarkable in appearance.  The spleen is within normal limits.  Scattered small stones are seen layering dependently in the gallbladder; the  gallbladder is otherwise unremarkable in appearance.  The pancreas and adrenal glands are unremarkable.  Small bilateral renal stones are seen, measuring up to 5 mm in size.  Tiny hypodensities within both kidneys likely reflect small cysts.  The kidneys are otherwise unremarkable in appearance. Minimal nonspecific perinephric stranding is noted bilaterally. There is no evidence of hydronephrosis.  No obstructing ureteral stones are seen.  No free fluid is identified.  The small bowel is unremarkable in appearance.  A tiny hiatal hernia is noted; the stomach is otherwise unremarkable.  No acute vascular abnormalities are seen. Relatively diffuse calcification is noted along the abdominal aorta and its branches.  The appendix is dilated to 1.3 cm in maximal diameter, with surrounding soft tissue inflammation and trace fluid.  Trace associated fluid is noted within the pelvis.  This is compatible with acute appendicitis.  There is no definite evidence for perforation or abscess formation at this time.  Scattered diverticulosis is noted about the entirety of the colon, more prominent along the distal ascending and transverse colon. The colon is otherwise grossly unremarkable in appearance.  The bladder is moderately distended and grossly unremarkable.  The patient is status post hysterectomy.  No suspicious adnexal masses are seen.  No inguinal lymphadenopathy is seen.  No acute osseous abnormalities are identified.  Vacuum phenomenon is noted at L4-L5.  IMPRESSION:  1.  Acute appendicitis, with dilatation of the appendix to 1.3 cm in diameter and surrounding soft tissue inflammation.  Trace fluid noted about the appendix, and within the pelvis.  No evidence for perforation or abscess formation at this time. 2.  Scattered diverticulosis noted about the entirety of the colon, more prominent along the distal ascending and transverse colon. 3.  Small bilateral renal stones, measuring up to 5 mm in size. Tiny bilateral  renal cysts seen. 4.  Tiny hiatal hernia seen. 5.  Relatively diffuse calcification along the abdominal aorta and its branches. 6.  Cholelithiasis again noted; gallbladder otherwise unremarkable in appearance.  These results were called by telephone on 05/25/2012 at 05:39 a.m. to [facility], who verbally acknowledged these results.   Original Report Authenticated By: Tonia Ghent, M.D.    Dg Chest Portable 1 View  05/25/2012  *RADIOLOGY REPORT*  Clinical Data: History of COPD with abdominal  pain and emesis.  PORTABLE CHEST - 1 VIEW  Comparison: 07/20/2011  Findings: Again noted are postoperative changes in left hemithorax. There are slightly low lung volumes.  No focal airspace disease or edema.  Heart size is upper limits normal but unchanged. Postoperative changes in the cervical spine.  IMPRESSION: Slightly low lung volumes with stable postoperative changes.  No acute findings.   Original Report Authenticated By: Richarda Overlie, M.D.     Microbiology: No results found for this or any previous visit (from the past 240 hour(s)).   Labs: Basic Metabolic Panel:  Recent Labs Lab 05/31/12 1802 06/02/12 0430 06/03/12 0516 06/04/12 0509 06/05/12 0430  NA 135 135 136 137 135  K 4.0 3.3* 4.3 4.2 4.4  CL 99 98 99 99 98  CO2 26 27 29 28 28   GLUCOSE 104* 76 83 105* 92  BUN 8 8 12 11 13   CREATININE 0.88 1.06 1.16* 1.21* 1.35*  CALCIUM 8.3* 8.4 8.6 9.4 9.1   Liver Function Tests: No results found for this basename: AST, ALT, ALKPHOS, BILITOT, PROT, ALBUMIN,  in the last 168 hours No results found for this basename: LIPASE, AMYLASE,  in the last 168 hours No results found for this basename: AMMONIA,  in the last 168 hours CBC:  Recent Labs Lab 05/30/12 0454 05/31/12 0435 05/31/12 1802 06/01/12 0507  WBC 8.8 7.1  --  9.1  HGB 7.5* 6.5* 9.4* 10.3*  HCT 21.8* 19.9* 27.6* 30.5*  MCV 99.5 99.0  --  93.8  PLT 205 206  --  240   Cardiac Enzymes: No results found for this basename:  CKTOTAL, CKMB, CKMBINDEX, TROPONINI,  in the last 168 hours BNP: BNP (last 3 results)  Recent Labs  06/02/12 0435  PROBNP 3699.0*   CBG: No results found for this basename: GLUCAP,  in the last 168 hours     Signed:  Cedrica Brune  [facility] 06/05/2012, 12:24 PM

## 2012-06-05 NOTE — Progress Notes (Signed)
Physical Therapy Treatment Patient Details Name: [name] MRN: [id number] DOB: 07-Dec-1940 Today's Date: 06/05/2012 Time: 7829-5621 PT Time Calculation (min): 27 min  PT Assessment / Plan / Recommendation Comments on Treatment Session  Pt progressing well with ambulation and able to perform exercises.      Follow Up Recommendations  Supervision/Assistance - 24 hour;SNF     Does the patient have the potential to tolerate intense rehabilitation     Barriers to Discharge        Equipment Recommendations  Rolling walker with 5" wheels    Recommendations for Other Services    Frequency     Plan Discharge plan remains appropriate;Frequency remains appropriate    Precautions / Restrictions Precautions Precautions: Fall Precaution Comments: abdominal surgery. monitor sats   Pertinent Vitals/Pain Pt reports abdominal pain with mobility however comfortable at rest.    Mobility  Bed Mobility Bed Mobility: Supine to Sit;Sit to Supine Supine to Sit: HOB elevated;5: Supervision Sit to Supine: 4: Min assist;HOB elevated Details for Bed Mobility Assistance: HOB 60 degrees. pt requested HOB raised for pain control, increased time and effort, assist for LEs onto EOB Transfers Transfers: Sit to Stand;Stand to Sit Sit to Stand: From bed;From chair/3-in-1;4: Min guard Stand to Sit: To chair/3-in-1;To bed;4: Min guard Details for Transfer Assistance: min/guard in case of assist, verbal cues for safe technique Ambulation/Gait Ambulation/Gait Assistance: 4: Min guard Ambulation Distance (Feet): 100 Feet (x2) Assistive device: Rolling walker Ambulation/Gait Assistance Details: standing rest break between ambulation, pt denies SOB, verbal cues for RW distance and posture Gait Pattern: Decreased stride length;Trunk flexed Gait velocity: decreased    Exercises General Exercises - Lower Extremity Ankle Circles/Pumps: AROM;Both;10 reps;Seated Long Arc Quad: AROM;Both;10 reps;Seated Heel  Slides: AROM;Both;5 reps;Supine Hip ABduction/ADduction: AROM;Both;10 reps;Supine Hip Flexion/Marching: AROM;Seated;Both;10 reps   PT Diagnosis:    PT Problem List:   PT Treatment Interventions:     PT Goals Acute Rehab PT Goals PT Goal: Supine/Side to Sit - Progress: Progressing toward goal PT Goal: Sit to Stand - Progress: Progressing toward goal PT Goal: Stand to Sit - Progress: Progressing toward goal PT Goal: Ambulate - Progress: Progressing toward goal  Visit Information  Last PT Received On: 06/05/12 Assistance Needed: +1    Subjective Data  Subjective: I'll try.  (exercises)   Cognition  Cognition Arousal/Alertness: Awake/alert Behavior During Therapy: WFL for tasks assessed/performed Overall Cognitive Status: Within Functional Limits for tasks assessed    Balance     End of Session PT - End of Session Activity Tolerance: Patient tolerated treatment well Patient left: in bed;with call bell/phone within reach   GP     Kreig Parson,[name] 06/05/2012, 4:06 PM Zenovia Jarred, PT, DPT 06/05/2012 Pager: [phone]

## 2012-06-05 NOTE — Progress Notes (Signed)
Slow progress but improving

## 2012-06-05 NOTE — Progress Notes (Signed)
11 Days Post-Op  Subjective: Had a regular diet this AM still feels weak and know she needs help to go home.  Lives with Lucila Maine and great grandson.  Objective: Vital signs in last 24 hours: Temp:  [97.6 F (36.4 C)-100.2 F (37.9 C)] 99.4 F (37.4 C) (04/21 0500) Pulse Rate:  [81-95] 88 (04/21 0500) Resp:  [16] 16 (04/21 0500) BP: (132-140)/(44-64) 132/47 mmHg (04/21 0500) SpO2:  [92 %-96 %] 92 % (04/21 0854) Last BM Date: 05/31/12 120 PO recorded yesterday, and 360 Po today, + Bm recorded yesterday Diet: dysphagia 3 Tm 100.2 Creatinine is up some   Intake/Output from previous day: 04/20 0701 - 04/21 0700 In: 120 [P.O.:120] Out: 750 [Urine:300; Emesis/NG output:450] Intake/Output this shift: Total I/O In: 360 [P.O.:360] Out: -   General appearance: alert, cooperative and no distress, feels warm. Resp:  SOME RALES in bases. GI: soft, + BS, and BM.  Open wound looks good, it has not been changed for some time.  some purulent drainage on dressing. Extremities: trrace to +1 edema both lower legs. She needs help to stand.  Lab Results:  No results found for this basename: WBC, HGB, HCT, PLT,  in the last 72 hours  BMET  Recent Labs  06/04/12 0509 06/05/12 0430  NA 137 135  K 4.2 4.4  CL 99 98  CO2 28 28  GLUCOSE 105* 92  BUN 11 13  CREATININE 1.21* 1.35*  CALCIUM 9.4 9.1   PT/INR No results found for this basename: LABPROT, INR,  in the last 72 hours  No results found for this basename: AST, ALT, ALKPHOS, BILITOT, PROT, ALBUMIN,  in the last 168 hours   Lipase     Component Value Date/Time   LIPASE 27 05/25/2012 0250     Studies/Results: No results found.  Medications: . enoxaparin (LOVENOX) injection  40 mg Subcutaneous Q24H  . feeding supplement  237 mL Oral Q24H  . furosemide  40 mg Intravenous Once  . ipratropium  0.5 mg Nebulization TID  . levalbuterol  0.63 mg Nebulization TID  . metoprolol succinate  25 mg Oral Daily  . multivitamin with  minerals  1 tablet Oral Daily  . pantoprazole  40 mg Oral Daily    Assessment/Plan Perforated, gangrenous appendicitis,  S/p Laparoscopy, exploratory laparotomy, lysis of adhesions, appendectomy, small bowel resection.  05/25/2012, Adolph Pollack, MD. Off antibiotic since 4/18 2. COPD 3. diastolic dysfunction/mild AS/AI/ acute on chronic diastolic heart failure 4..renal insuffiencey stage 2-3 5. Hx of back pain/positive AND and rheumatoid factors/polyarthragias 6. Hx of lung cancer, lobectomy 2010 7.Hypertension 8.Anemia  Plan: I think she's doing well from her surgery.  Renal function shows creatinine is up some, she is on IV lasix,, she has been transfused, and still looks a bit fluid overloaded. She required O2 laST WEEK ON 4/17 for abmulation.  Last CXR on 4/19, shoed mild edema and bilat effusions. Continue OT/PT, mobilize more;  SNF when she is stable for that from a medical standpoint, she is still on IV lasix. Recheck labs and cxr tomorrow. Check urine.       LOS: 11 days    [name] 06/05/2012

## 2012-06-06 DIAGNOSIS — F329 Major depressive disorder, single episode, unspecified: Secondary | ICD-10-CM

## 2012-06-06 LAB — BASIC METABOLIC PANEL
GFR calc non Af Amer: 41 mL/min — ABNORMAL LOW (ref >90)
Glucose, Bld: 89 mg/dL (ref 70–99)
Potassium: 4.1 mEq/L (ref 3.5–5.1)
Sodium: 136 mEq/L (ref 135–145)

## 2012-06-06 LAB — CBC
Hemoglobin: 10.2 g/dL — ABNORMAL LOW (ref 12.0–15.0)
MCH: 32.2 pg (ref 26.0–34.0)
RBC: 3.17 MIL/uL — ABNORMAL LOW (ref 3.87–5.11)
WBC: 5.6 10*3/uL (ref 4.0–10.5)

## 2012-06-06 NOTE — Progress Notes (Signed)
Patient ID: [name], female   DOB: 06/04/1940, 72 y.o.   MRN: [id number] 12 Days Post-Op  Subjective: Reports some cramping abd pain this am, coughed up some phlegm today, denies n/v.  Objective: Vital signs in last 24 hours: Temp:  [98.5 F (36.9 C)-99.4 F (37.4 C)] 98.5 F (36.9 C) (04/22 0516) Pulse Rate:  [79-85] 84 (04/22 0516) Resp:  [18-22] 20 (04/22 0516) BP: (134-165)/(47-63) 165/63 mmHg (04/22 0516) SpO2:  [92 %-96 %] 93 % (04/22 0516) Weight:  [171 lb 1.2 oz (77.6 kg)] 171 lb 1.2 oz (77.6 kg) (04/22 0500) Last BM Date: 06/05/12 120 PO recorded yesterday, and 360 Po today, + Bm recorded yesterday Diet: dysphagia 3 Tm 100.2 Creatinine is up some   Intake/Output from previous day: 04/21 0701 - 04/22 0700 In: 460 [P.O.:460] Out: 350 [Urine:350] Intake/Output this shift:    General appearance: alert, cooperative and no distress, feels warm. GI: soft, + BS, wounds look good, staples in place and look well healing. Extremities: trrace to +1 edema both lower legs.   Lab Results:   Recent Labs  06/06/12 0435  WBC 5.6  HGB 10.2*  HCT 30.6*  PLT 320    BMET  Recent Labs  06/05/12 1205 06/06/12 0435  NA 135 136  K 4.3 4.1  CL 97 100  CO2 28 26  GLUCOSE 92 89  BUN 14 10  CREATININE 1.37* 1.28*  CALCIUM 9.3 8.7   PT/INR No results found for this basename: LABPROT, INR,  in the last 72 hours   Recent Labs Lab 06/05/12 1205  AST 12  ALT 15  ALKPHOS 60  BILITOT 0.3  PROT 7.0  ALBUMIN 2.7*     Lipase     Component Value Date/Time   LIPASE 27 05/25/2012 0250     Studies/Results: Dg Chest 2 View  06/05/2012  *RADIOLOGY REPORT*  Clinical Data: Bilateral interstitial edema and COPD  CHEST - 2 VIEW  Comparison: 06/03/2012  Findings: Heart size appears mildly enlarged.  The lung volumes are low.  Atelectasis is noted within both lung bases.  Small bilateral pleural effusions and bibasilar atelectasis are suspected.  No interstitial edema or  airspace consolidation.  IMPRESSION:  1.  Cardiac enlargement. 2.  Low lung volumes, small bilateral pleural effusions and bibasilar atelectasis.   Original Report Authenticated By: Signa Kell, M.D.     Medications: . enoxaparin (LOVENOX) injection  40 mg Subcutaneous Q24H  . feeding supplement  237 mL Oral Q24H  . furosemide  40 mg Intravenous Once  . furosemide  40 mg Oral BID  . ipratropium  0.5 mg Nebulization TID  . levalbuterol  0.63 mg Nebulization TID  . metoprolol succinate  25 mg Oral Daily  . multivitamin with minerals  1 tablet Oral Daily  . pantoprazole  40 mg Oral Daily    Assessment/Plan Perforated, gangrenous appendicitis,  S/p Laparoscopy, exploratory laparotomy, lysis of adhesions, appendectomy, small bowel resection.  05/25/2012, Adolph Pollack, MD. Off antibiotic since 4/18 2. COPD 3. diastolic dysfunction/mild AS/AI/ acute on chronic diastolic heart failure 4..renal insuffiencey stage 2-3 5. Hx of back pain/positive AND and rheumatoid factors/polyarthragias 6. Hx of lung cancer, lobectomy 2010 7.Hypertension 8.Anemia  Plan:  Seems overall stable from surgical standpoint although having some cramping pain today, still passing gas, BM last night, tolerating diet, no periotonitis, WBC normal and SCr improving, think she is still ok for discharge to snf but per medicine for discharge, staples can come out and apply  steri-strips at 14 days.       LOS: 12 days    Julieann Drummonds 06/06/2012

## 2012-06-06 NOTE — Progress Notes (Signed)
Agree. Can place when ready

## 2012-06-06 NOTE — Progress Notes (Signed)
Physical Therapy Treatment Patient Details Name: [name] MRN: [id number] DOB: [birth year] Today's Date: 06/06/2012 Time: 4098-1191 PT Time Calculation (min): 25 min  PT Assessment / Plan / Recommendation Comments on Treatment Session  Continuing to progress with mobility. Plan is for SNF possibly today. Recommend SNF    Follow Up Recommendations  Supervision/Assistance - 24 hour;SNF     Does the patient have the potential to tolerate intense rehabilitation     Barriers to Discharge        Equipment Recommendations  Rolling walker with 5" wheels    Recommendations for Other Services    Frequency     Plan Discharge plan remains appropriate    Precautions / Restrictions Precautions Precautions: Fall Precaution Comments: abd surgery Restrictions Weight Bearing Restrictions: No   Pertinent Vitals/Pain 6/10 abdomen    Mobility  Bed Mobility Bed Mobility: Not assessed Details for Bed Mobility Assistance: pt sitting in recliner Transfers Transfers: Sit to Stand;Stand to Sit Sit to Stand: 4: Min guard;From chair/3-in-1;From toilet Stand to Sit: 4: Min guard;To chair/3-in-1;To toilet Details for Transfer Assistance: min/guard in case of assist, verbal cues for safe technique Ambulation/Gait Ambulation/Gait Assistance: 4: Min guard Ambulation Distance (Feet): 120 Feet Assistive device: Rolling walker Gait Pattern: Step-through pattern;Trunk flexed;Decreased stride length    Exercises     PT Diagnosis:    PT Problem List:   PT Treatment Interventions:     PT Goals Acute Rehab PT Goals Pt will go Sit to Stand: with modified independence PT Goal: Sit to Stand - Progress: Progressing toward goal Pt will go Stand to Sit: with modified independence PT Goal: Stand to Sit - Progress: Progressing toward goal Pt will Ambulate: 51 - 150 feet;with modified independence;with least restrictive assistive device PT Goal: Ambulate - Progress: Progressing toward goal  Visit  Information  Last PT Received On: 06/06/12 Assistance Needed: +1    Subjective Data  Subjective: I don't know whats going on with my body Patient Stated Goal: to be able to walk   Cognition  Cognition Arousal/Alertness: Awake/[name] Behavior During Therapy: WFL for tasks assessed/performed Overall Cognitive Status: Within Functional Limits for tasks assessed    Balance     End of Session PT - End of Session Activity Tolerance: Patient tolerated treatment well;Patient limited by fatigue Patient left: in chair;with call bell/phone within reach   GP     [name], MPT Pager: [phone]

## 2012-06-06 NOTE — Evaluation (Signed)
Occupational Therapy Evaluation Patient Details Name: CHRISTYNA LETENDRE MRN: [id number] DOB: 1940/11/07 Today's Date: 06/06/2012 Time: 1040-1102 OT Time Calculation (min): 22 min  OT Assessment / Plan / Recommendation Clinical Impression  Pt is a 72 year old female s/p open appendectomy & small bowel resection for peritonitis, perforated gangrenous appendix on 4/10.  Pt will benefit from skilled OT to increase I with ADL activity and return to PLOF    OT Assessment  Patient needs continued OT Services    Follow Up Recommendations  SNF             Frequency  Min 2X/week    Precautions / Restrictions Precautions Precautions: Fall Precaution Comments: abdominal surgery. monitor sats       ADL  Upper Body Dressing: Simulated;Minimal assistance Where Assessed - Upper Body Dressing: Unsupported sitting Lower Body Dressing: Simulated;Maximal assistance Where Assessed - Lower Body Dressing: Unsupported sit to stand Toilet Transfer: Performed;Minimal assistance Toilet Transfer Method: Sit to stand Toilet Transfer Equipment: Bedside commode Toileting - Clothing Manipulation and Hygiene: Maximal assistance Where Assessed - Toileting Clothing Manipulation and Hygiene: Standing    OT Diagnosis: Generalized weakness;Acute pain  OT Problem List: Decreased strength;Pain;Decreased activity tolerance OT Treatment Interventions: Self-care/ADL training;Patient/family education   OT Goals Acute Rehab OT Goals OT Goal Formulation: With patient Time For Goal Achievement: 06/20/12 ADL Goals Pt Will Perform Grooming: with supervision;Standing at sink ADL Goal: Grooming - Progress: Goal set today Pt Will Perform Lower Body Dressing: with supervision;with adaptive equipment;Sit to stand from chair ADL Goal: Lower Body Dressing - Progress: Goal set today Pt Will Transfer to Toilet: with supervision;Comfort height toilet ADL Goal: Toilet Transfer - Progress: Goal set today Pt Will Perform  Toileting - Clothing Manipulation: with supervision;Standing ADL Goal: Toileting - Clothing Manipulation - Progress: Goal set today  Visit Information  Last OT Received On: 06/06/12    Subjective Data  Subjective: my stomach hurts- called for pain meds   Prior Functioning     Home Living Lives With: Other (Comment) (nephew lives with her) Type of Home: House Home Access: Level entry Home Layout: One level Bathroom Shower/Tub: Engineer, manufacturing systems: Standard Home Adaptive Equipment: Straight cane Prior Function Level of Independence: Independent Communication Communication: No difficulties         Vision/Perception Vision - History Patient Visual Report: No change from baseline   Cognition  Cognition Arousal/Alertness: Awake/alert Behavior During Therapy: WFL for tasks assessed/performed Overall Cognitive Status: Within Functional Limits for tasks assessed    Extremity/Trunk Assessment Right Upper Extremity Assessment RUE ROM/Strength/Tone: [facility] for tasks assessed Left Upper Extremity Assessment LUE ROM/Strength/Tone: WFL for tasks assessed     Mobility Transfers Transfers: Sit to Stand;Stand to Sit Sit to Stand: 4: Min assist;From toilet;From bed;With upper extremity assist Stand to Sit: 4: Min assist;To chair/3-in-1;To toilet;With upper extremity assist           End of Session  Pt left sitting in the chair with daughter and call bell in lap  GO     Jeanclaude Wentworth, [place]ung 06/06/2012, 11:27 AM

## 2012-06-06 NOTE — Progress Notes (Signed)
Patient is set to discharge to [facility] today. Patient & daughter, [name] aware. [facility] called for transport. Discharge packet in [place].   Clinical Social Work Department CLINICAL SOCIAL WORK PLACEMENT NOTE 06/06/2012  Patient:  [name], [name]  Account Number:  [id number] Admit date:  05/25/2012  Clinical Social Worker:  Orpah Greek  Date/time:  05/29/2012 04:05 PM  Clinical Social Work is seeking post-discharge placement for this patient at the following level of care:   SKILLED NURSING   (*CSW will update this form in Epic as items are completed)   05/29/2012  Patient/family provided with [facility] Department of Clinical Social Work's list of facilities offering this level of care within the geographic area requested by the patient (or if unable, by the patient's family).  05/29/2012  Patient/family informed of their freedom to choose among providers that offer the needed level of care, that participate in Medicare, Medicaid or managed care program needed by the patient, have an available bed and are willing to accept the patient.  05/29/2012  Patient/family informed of [facility]' ownership interest in [facility], as well as of the fact that they are under no obligation to receive care at this facility.  PASARR submitted to [employer] on 05/29/2012 PASARR number received from [employer] on 05/29/2012  FL2 transmitted to all facilities in geographic area requested by pt/family on  05/29/2012 FL2 transmitted to all facilities within larger geographic area on   Patient informed that his/her managed care company has contracts with or will negotiate with  certain facilities, including the following:   [employer]     Patient/family informed of bed offers received:  05/29/2012 Patient chooses bed at [facility] Physician recommends and patient chooses bed at    Patient to be transferred to [facility] on   06/06/2012 Patient to be transferred to facility by [facility]  The following physician request were entered in Epic:   Additional Comments: Unice Bailey, LCSW [facility] Clinical Social Worker cell #: [phone]

## 2012-06-06 NOTE — Progress Notes (Signed)
Report called to Roanna Raider, Charity fundraiser at [employer].

## 2012-06-06 NOTE — Discharge Summary (Signed)
Physician Discharge Summary  [name]:[id number] DOB: [birth year] DOA: 05/25/2012  PCP: [name], MD  Admit date: 05/25/2012 Discharge date: 06/06/2012    Recommendations for Outpatient Follow-up:  1. Follow up with BMP in 2 days to check creatinine.  2. Follow up with SURGERY as recommended.  3. Follow up with PCP in 1 one week.   Discharge Diagnoses:  Principal Problem:   Acute perforated appendicitis with generalized peritonitis Active Problems:   HYPERLIPIDEMIA   AORTIC STENOSIS/ INSUFFICIENCY, NON-RHEUMATIC   C O P D   Stricture and stenosis of esophagus   Heart palpitations   CKD (chronic kidney disease)   Lung cancer   COPD, moderate   Acute diastolic congestive heart failure   Discharge Condition: fair  Diet recommendation: low salt diet  Filed Weights   06/03/12 0500 06/04/12 0540 06/06/12 0500  Weight: 78.9 kg (173 lb 15.1 oz) 78.835 kg (173 lb 12.8 oz) 77.6 kg (171 lb 1.2 oz)    History of present illness:   [name] is a 72 y.o. 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 She describes abdominal pain as initially epigastric, now going to the right upper and right lower quadrants. She denies any fever, chills. She had few episodes of vomiting. She denies any frank diarrhea, however endorses an increasing frequency of stools. She initiated she repeated her abdominal pain to a food that she ate, however nobody also the family got sick. The abdominal pain got to the point where it was so severe and unrelenting that she decided to come to the emergency room. In the ED showed a CAT scan of the abdomen which showed acute  appendicitis. Surgery has been consulted for appendectomy. Hospitalist service was asked for admission given multiple comorbidities  She was found to have acute cholecystitis, subsequently underwent ex lap, Appy, lysis of adhesions and small bowel resection.  Her post operative course was significant for pain, tachycardia, anemia.   Hospital Course:   1. Perforated Gangrenous appendicitis: ex lap, lysis of adhesions, appendectomy, small bowel resection 4/10  POD #10 , NGT removed 4/13, cut down IVF, started on clears, advancing diet as tolerated. She is able to tolerate diet without any nausea or vomiting.   removal of staples per [facility].  2. COPD: stable, albuterol nebs PRN    3. H/o grade 1 diastolic dysfunction/mild AS/AI/ acute on chronic diastolic heart failure  - probably from IV fluids given post op. Last eCHO was done less than 6 months ago, showed good LVEF and diastolic dysfunction. Repeat CXR shows improvement in pulmonary congestion. Decreased the lasix dose and changed to po lasix and she can resume her home dose of lasix on discharge.  Initial CXR showed pulmonary congestion, with left infiltrate and nodular densities. A CT chest with contrast ordered to evaluate for recurrence of the lung cancer, which was negative for infiltrates and recurrent lung cancer.  - and a rpeat CXR yesterday did  Not reveal any interstitial edema or consolidation. Her small bil effusions are chronic. We have weaned her off the oxygen and  PT/OT eval recommended SNF placement.  I/O last 3 completed shifts: In: 460 [P.O.:460] Out: 650 [Urine:650]  4. HTN:  -Was on IV hydralazine and metoprolol , change to PO Toprol XL  -slowly resume home antihypertensives   5. Tachycardia:improved  -post op pain and Beta blocker withdrawal  -resume PO Betablocker   5. H/o lung CA  -s/p left upper lobe lobectomy in 2010  - repeat CT chest with contrast ordered and negative for recurrence.   6. CKD stage  2 to 3:  -baseline creatinine 1.4-1.6  -stable, monitor   7. Anemia: post op/chronic disease and hemodilution. Stool for occult blood negative.  -transfuse if <7 or symptomatic  -received 2 units prbc transfusion yesterday and H&H has improved to 10.3/30.5.   Procedures: PROCEDURE: Laparoscopy, exploratory laparotomy, lysis of adhesions, appendectomy, small bowel resection 4/10   Consultations:  surgery  Discharge Exam: Filed Vitals:   06/05/12 2142 06/06/12 0500 06/06/12 0516 06/06/12 0902  BP:   165/63   Pulse:   84   Temp:   98.5 F (36.9 C)   TempSrc:   Oral   Resp: 20  20   Height:      Weight:  77.6 kg (171 lb 1.2 oz)    SpO2:   93% 98%    General: AAOx3  Cardiovascular: S1S2/RRR  Respiratory: decreased BS at bases, no wheezing or rhonchi.  Abdomen: soft, distended, tenderness as expected, dressing, BS present Ext: no edema c/c  Neuro: moves all extremities   Discharge Instructions       Future Appointments Provider Department Dept Phone   06/23/2012 10:50 AM [facility] [phone]   Patient should wear two piece clothing and wear no powder or deodorant. Patient should arrive 15 minutes early.   09/20/2012 9:00 AM Mauri [place] [facility] MEDICAL ONCOLOGY [phone]   09/20/2012 9:30 AM Si Gaul, MD [facility] MEDICAL ONCOLOGY [phone]   11/20/2012 10:45 AM Corwin Levins, MD [facility] [phone]       Medication List    TAKE these medications       albuterol 108 (90 BASE) MCG/ACT inhaler  Commonly known as:  PROVENTIL HFA;VENTOLIN HFA  Inhale 1-2 puffs into the lungs 4 (four) times daily as needed. For wheezing or shortness of breath.     alendronate 70 MG tablet  Commonly known as:  FOSAMAX  Take 1 tablet (70 mg total) by mouth every 7 (seven) days. Take with a full glass of water on an empty stomach. Patient takes on Mondays.     ALPRAZolam  0.5 MG tablet  Commonly known as:  XANAX  Take 0.5 mg by mouth daily as needed for anxiety.     amLODipine 5 MG tablet  Commonly known as:  NORVASC  Take 1 tablet (5 mg total) by mouth daily.     citalopram 10 MG tablet  Commonly known as:  CELEXA  Take 1 tablet (10 mg total) by mouth daily.     feeding supplement Liqd  Take 237 mLs by mouth daily.     furosemide 40 MG tablet  Commonly known as:  LASIX  Take 40 mg by mouth 2 (two) times daily.     HYDROcodone-acetaminophen 10-325 MG per tablet  Commonly known as:  NORCO  TAKE 1 TABLET BY MOUTH EVERY 6 HOURS AS NEEDED FOR PAIN     metoprolol succinate 25 MG 24 hr tablet  Commonly known as:  TOPROL-XL  Take 1 tablet (25 mg total) by mouth daily.  ondansetron 4 MG tablet  Commonly known as:  ZOFRAN  Take 1 tablet (4 mg total) by mouth every 6 (six) hours as needed.     oxyCODONE 5 MG immediate release tablet  Commonly known as:  Oxy IR/ROXICODONE  Take 1 tablet (5 mg total) by mouth every 6 (six) hours as needed.     pantoprazole 40 MG tablet  Commonly known as:  PROTONIX  TAKE 1 TABLET (40 MG TOTAL) BY MOUTH DAILY.     pravastatin 40 MG tablet  Commonly known as:  PRAVACHOL  Take 1 tablet (40 mg total) by mouth daily.     Vitamin D3 1000 UNITS Caps  Take 1 capsule by mouth daily.     zolpidem 5 MG tablet  Commonly known as:  AMBIEN  Take 1 tablet (5 mg total) by mouth at bedtime as needed. For sleep.       Follow-up Information   Follow up with [name], MD. Schedule an appointment as soon as possible for a visit in 1 week.   Contact information:   [address] [place] [address] [phone]        The results of significant diagnostics from this hospitalization (including imaging, microbiology, ancillary and laboratory) are listed below for reference.    Significant Diagnostic Studies: Dg Chest 2 View  06/03/2012  *RADIOLOGY REPORT*  Clinical Data: Cough, weakness, pulmonary  congestion  CHEST - 2 VIEW  Comparison: CT chest and chest radiograph dated 06/01/2012  Findings: Cardiomegaly with mild interstitial edema.  Suspected small bilateral pleural effusions.  Associated lower lobe opacities, likely atelectasis, pneumonia not excluded.  Mild degenerative changes of the visualized thoracolumbar spine. Cervical spine fixation hardware.  IMPRESSION: Cardiomegaly with mild interstitial edema and suspected small bilateral pleural effusions.  Associated lower lobe opacities, likely atelectasis, pneumonia not excluded.   Original Report Authenticated By: Charline Bills, M.D.    Dg Chest 2 View  06/01/2012  *RADIOLOGY REPORT*  Clinical Data: Shortness of breath, hypoxia, left side abdominal pain, history hypertension, smoking, lung cancer, COPD GERD, Barrett's esophagus  CHEST - 2 VIEW  Comparison: 05/25/2012  Findings: Enlargement of cardiac silhouette with pulmonary vascular congestion. Atherosclerotic calcification aorta. Surgical clips and AP window/left hilum question prior left upper lobe resection. Left basilar infiltrate. Atelectasis versus infiltrate right base. Linear scarring or subsegmental atelectasis in left upper lobe appearing slightly more nodular than on previous exams, cannot exclude developing pulmonary nodule. No gross pleural effusion or pneumothorax.  IMPRESSION: Enlargement of cardiac silhouette with pulmonary vascular congestion. Left basilar infiltrate. Right basilar atelectasis versus consolidation. Linear scarring versus chronic atelectasis in left upper lobe, appearing more nodular than on previous exams; CT chest recommended to exclude recurrent pulmonary tumor, exam with contrast if patient's renal function permits.   Original Report Authenticated By: Ulyses Southward, M.D.    Ct Chest W Contrast  06/01/2012  *RADIOLOGY REPORT*  Clinical Data: Follow-up abnormal chest x-ray.  Shortness of breath.  CT CHEST WITH CONTRAST  Technique:  Multidetector CT imaging of  the chest was performed following the standard protocol during bolus administration of intravenous contrast.  Contrast: 80mL OMNIPAQUE IOHEXOL 300 MG/ML  SOLN  Comparison: Chest x-ray 06/01/2012 and chest CT 09/20/2011.  Findings: The chest wall is unremarkable.  No breast masses, supraclavicular or axillary adenopathy.  The thyroid gland appears normal.  The bony thorax is intact.  No destructive bone lesions or spinal canal compromise.  The heart is mildly enlarged but stable.  Stable  coronary artery calcifications and dense aortic calcifications.  No aortic aneurysm or dissection.  There are scattered mediastinal and hilar lymph nodes but these appear relatively stable.  The esophagus is grossly normal.  There is a small hiatal hernia.  Examination of the lung parenchyma demonstrates patchy areas of subpleural atelectasis and minimal peripheral interstitial lung disease.  There is a small rim of left pleural fluid on the left. No infiltrates, masses or worrisome nodules.  The bony structures are unremarkable. The upper abdomen demonstrates cholelithiasis and a left renal calculus.  IMPRESSION:  1.  Small left pleural effusion. 2.  Patchy areas of subpleural atelectasis and probable peripheral and basilar interstitial lung disease. 3.  No worrisome mass lesions or pulmonary nodules.  Left upper lobe scarring changes are noted. 4.  Cardiac enlargement with dense coronary artery calcifications.   Original Report Authenticated By: Rudie Meyer, M.D.    Ct Abdomen Pelvis W Contrast  05/25/2012  *RADIOLOGY REPORT*  Clinical Data: Mid abdominal pain, nausea, vomiting and loose stools.  CT ABDOMEN AND PELVIS WITH CONTRAST  Technique:  Multidetector CT imaging of the abdomen and pelvis was performed following the standard protocol during bolus administration of intravenous contrast.  Contrast: 80mL OMNIPAQUE IOHEXOL 300 MG/ML  SOLN  Comparison: PET/CT performed 06/27/2008, and MRI of the lumbar spine performed  10/24/2010; abdominal ultrasound performed 03/09/2007  Findings: The visualized lung bases are clear.  There is mild nonspecific prominence of the intrahepatic biliary ducts.  The liver is otherwise unremarkable in appearance.  The spleen is within normal limits.  Scattered small stones are seen layering dependently in the gallbladder; the gallbladder is otherwise unremarkable in appearance.  The pancreas and adrenal glands are unremarkable.  Small bilateral renal stones are seen, measuring up to 5 mm in size.  Tiny hypodensities within both kidneys likely reflect small cysts.  The kidneys are otherwise unremarkable in appearance. Minimal nonspecific perinephric stranding is noted bilaterally. There is no evidence of hydronephrosis.  No obstructing ureteral stones are seen.  No free fluid is identified.  The small bowel is unremarkable in appearance.  A tiny hiatal hernia is noted; the stomach is otherwise unremarkable.  No acute vascular abnormalities are seen. Relatively diffuse calcification is noted along the abdominal aorta and its branches.  The appendix is dilated to 1.3 cm in maximal diameter, with surrounding soft tissue inflammation and trace fluid.  Trace associated fluid is noted within the pelvis.  This is compatible with acute appendicitis.  There is no definite evidence for perforation or abscess formation at this time.  Scattered diverticulosis is noted about the entirety of the colon, more prominent along the distal ascending and transverse colon. The colon is otherwise grossly unremarkable in appearance.  The bladder is moderately distended and grossly unremarkable.  The patient is status post hysterectomy.  No suspicious adnexal masses are seen.  No inguinal lymphadenopathy is seen.  No acute osseous abnormalities are identified.  Vacuum phenomenon is noted at L4-L5.  IMPRESSION:  1.  Acute appendicitis, with dilatation of the appendix to 1.3 cm in diameter and surrounding soft tissue  inflammation.  Trace fluid noted about the appendix, and within the pelvis.  No evidence for perforation or abscess formation at this time. 2.  Scattered diverticulosis noted about the entirety of the colon, more prominent along the distal ascending and transverse colon. 3.  Small bilateral renal stones, measuring up to 5 mm in size. Tiny bilateral renal cysts seen. 4.  Tiny hiatal hernia seen. 5.  Relatively diffuse calcification along the abdominal aorta and its branches. 6.  Cholelithiasis again noted; gallbladder otherwise unremarkable in appearance.  These results were called by telephone on 05/25/2012 at 05:39 a.m. to [facility] PA, who verbally acknowledged these results.   Original Report Authenticated By: Tonia Ghent, M.D.    Dg Chest Portable 1 View  05/25/2012  *RADIOLOGY REPORT*  Clinical Data: History of COPD with abdominal pain and emesis.  PORTABLE CHEST - 1 VIEW  Comparison: 07/20/2011  Findings: Again noted are postoperative changes in left hemithorax. There are slightly low lung volumes.  No focal airspace disease or edema.  Heart size is upper limits normal but unchanged. Postoperative changes in the cervical spine.  IMPRESSION: Slightly low lung volumes with stable postoperative changes.  No acute findings.   Original Report Authenticated By: Richarda Overlie, M.D.     Microbiology: No results found for this or any previous visit (from the past 240 hour(s)).   Labs: Basic Metabolic Panel:  Recent Labs Lab 06/03/12 0516 06/04/12 0509 06/05/12 0430 06/05/12 1205 06/06/12 0435  NA 136 137 135 135 136  K 4.3 4.2 4.4 4.3 4.1  CL 99 99 98 97 100  CO2 29 28 28 28 26   GLUCOSE 83 105* 92 92 89  BUN 12 11 13 14 10   CREATININE 1.16* 1.21* 1.35* 1.37* 1.28*  CALCIUM 8.6 9.4 9.1 9.3 8.7   Liver Function Tests:  Recent Labs Lab 06/05/12 1205  AST 12  ALT 15  ALKPHOS 60  BILITOT 0.3  PROT 7.0  ALBUMIN 2.7*   No results found for this basename: LIPASE, AMYLASE,  in the last  168 hours No results found for this basename: AMMONIA,  in the last 168 hours CBC:  Recent Labs Lab 05/31/12 0435 05/31/12 1802 06/01/12 0507 06/06/12 0435  WBC 7.1  --  9.1 5.6  HGB 6.5* 9.4* 10.3* 10.2*  HCT 19.9* 27.6* 30.5* 30.6*  MCV 99.0  --  93.8 96.5  PLT 206  --  240 320   Cardiac Enzymes: No results found for this basename: CKTOTAL, CKMB, CKMBINDEX, TROPONINI,  in the last 168 hours BNP: BNP (last 3 results)  Recent Labs  06/02/12 0435 06/05/12 1205  PROBNP 3699.0* 1043.0*   CBG: No results found for this basename: GLUCAP,  in the last 168 hours     Signed:  Shay Bartoli  [facility] 06/06/2012, 10:58 AM

## 2012-06-13 ENCOUNTER — Other Ambulatory Visit (INDEPENDENT_AMBULATORY_CARE_PROVIDER_SITE_OTHER): Payer: Medicare Other

## 2012-06-13 ENCOUNTER — Encounter: Payer: Self-pay | Admitting: Internal Medicine

## 2012-06-13 ENCOUNTER — Other Ambulatory Visit: Payer: Self-pay | Admitting: Internal Medicine

## 2012-06-13 ENCOUNTER — Encounter (HOSPITAL_COMMUNITY): Payer: Self-pay | Admitting: Family Medicine

## 2012-06-13 ENCOUNTER — Ambulatory Visit (INDEPENDENT_AMBULATORY_CARE_PROVIDER_SITE_OTHER)
Admission: RE | Admit: 2012-06-13 | Discharge: 2012-06-13 | Disposition: A | Discharge status: Home / Self Care | Payer: Medicare Other | Source: Ambulatory Visit | Attending: Internal Medicine | Admitting: Internal Medicine

## 2012-06-13 ENCOUNTER — Emergency Department (HOSPITAL_COMMUNITY)
Admission: EM | Admit: 2012-06-13 | Discharge: 2012-06-14 | Disposition: A | Discharge status: Home / Self Care | Payer: Medicare Other | Attending: Emergency Medicine | Admitting: Emergency Medicine

## 2012-06-13 ENCOUNTER — Telehealth: Payer: Self-pay | Admitting: Internal Medicine

## 2012-06-13 ENCOUNTER — Ambulatory Visit (INDEPENDENT_AMBULATORY_CARE_PROVIDER_SITE_OTHER): Payer: Medicare Other | Admitting: Internal Medicine

## 2012-06-13 VITALS — BP 132/58 | HR 93 | Temp 97.7°F

## 2012-06-13 DIAGNOSIS — R112 Nausea with vomiting, unspecified: Secondary | ICD-10-CM | POA: Insufficient documentation

## 2012-06-13 DIAGNOSIS — R1031 Right lower quadrant pain: Secondary | ICD-10-CM | POA: Insufficient documentation

## 2012-06-13 DIAGNOSIS — J441 Chronic obstructive pulmonary disease with (acute) exacerbation: Secondary | ICD-10-CM | POA: Insufficient documentation

## 2012-06-13 DIAGNOSIS — F411 Generalized anxiety disorder: Secondary | ICD-10-CM | POA: Insufficient documentation

## 2012-06-13 DIAGNOSIS — Z862 Personal history of diseases of the blood and blood-forming organs and certain disorders involving the immune mechanism: Secondary | ICD-10-CM | POA: Insufficient documentation

## 2012-06-13 DIAGNOSIS — K352 Acute appendicitis with generalized peritonitis, without abscess: Secondary | ICD-10-CM

## 2012-06-13 DIAGNOSIS — Z872 Personal history of diseases of the skin and subcutaneous tissue: Secondary | ICD-10-CM | POA: Insufficient documentation

## 2012-06-13 DIAGNOSIS — Z87442 Personal history of urinary calculi: Secondary | ICD-10-CM | POA: Insufficient documentation

## 2012-06-13 DIAGNOSIS — G4700 Insomnia, unspecified: Secondary | ICD-10-CM | POA: Insufficient documentation

## 2012-06-13 DIAGNOSIS — F329 Major depressive disorder, single episode, unspecified: Secondary | ICD-10-CM | POA: Insufficient documentation

## 2012-06-13 DIAGNOSIS — I10 Essential (primary) hypertension: Secondary | ICD-10-CM

## 2012-06-13 DIAGNOSIS — K219 Gastro-esophageal reflux disease without esophagitis: Secondary | ICD-10-CM | POA: Insufficient documentation

## 2012-06-13 DIAGNOSIS — R197 Diarrhea, unspecified: Secondary | ICD-10-CM | POA: Insufficient documentation

## 2012-06-13 DIAGNOSIS — Z880 Allergy status to penicillin: Secondary | ICD-10-CM | POA: Insufficient documentation

## 2012-06-13 DIAGNOSIS — Z8679 Personal history of other diseases of the circulatory system: Secondary | ICD-10-CM | POA: Insufficient documentation

## 2012-06-13 DIAGNOSIS — Z8709 Personal history of other diseases of the respiratory system: Secondary | ICD-10-CM | POA: Insufficient documentation

## 2012-06-13 DIAGNOSIS — Z8719 Personal history of other diseases of the digestive system: Secondary | ICD-10-CM | POA: Insufficient documentation

## 2012-06-13 DIAGNOSIS — Z8739 Personal history of other diseases of the musculoskeletal system and connective tissue: Secondary | ICD-10-CM | POA: Insufficient documentation

## 2012-06-13 DIAGNOSIS — Z87448 Personal history of other diseases of urinary system: Secondary | ICD-10-CM | POA: Insufficient documentation

## 2012-06-13 DIAGNOSIS — Z8639 Personal history of other endocrine, nutritional and metabolic disease: Secondary | ICD-10-CM | POA: Insufficient documentation

## 2012-06-13 DIAGNOSIS — R011 Cardiac murmur, unspecified: Secondary | ICD-10-CM | POA: Insufficient documentation

## 2012-06-13 DIAGNOSIS — Z87891 Personal history of nicotine dependence: Secondary | ICD-10-CM | POA: Insufficient documentation

## 2012-06-13 DIAGNOSIS — Z85118 Personal history of other malignant neoplasm of bronchus and lung: Secondary | ICD-10-CM | POA: Insufficient documentation

## 2012-06-13 DIAGNOSIS — Z8701 Personal history of pneumonia (recurrent): Secondary | ICD-10-CM | POA: Insufficient documentation

## 2012-06-13 DIAGNOSIS — Z79899 Other long term (current) drug therapy: Secondary | ICD-10-CM | POA: Insufficient documentation

## 2012-06-13 DIAGNOSIS — E785 Hyperlipidemia, unspecified: Secondary | ICD-10-CM | POA: Insufficient documentation

## 2012-06-13 DIAGNOSIS — F3289 Other specified depressive episodes: Secondary | ICD-10-CM | POA: Insufficient documentation

## 2012-06-13 LAB — HEPATIC FUNCTION PANEL
ALT: 15 U/L (ref 0–35)
AST: 13 U/L (ref 0–37)
Albumin: 3.3 g/dL — ABNORMAL LOW (ref 3.5–5.2)
Alkaline Phosphatase: 50 U/L (ref 39–117)
Bilirubin, Direct: 0.2 mg/dL (ref 0.0–0.3)
Total Protein: 7.7 g/dL (ref 6.0–8.3)

## 2012-06-13 LAB — URINALYSIS, ROUTINE W REFLEX MICROSCOPIC
Ketones, ur: NEGATIVE
Nitrite: NEGATIVE
Specific Gravity, Urine: 1.02 (ref 1.000–1.030)
Urobilinogen, UA: 0.2 (ref 0.0–1.0)
pH: 5.5 (ref 5.0–8.0)

## 2012-06-13 LAB — CBC WITH DIFFERENTIAL/PLATELET
Basophils Absolute: 0 10*3/uL (ref 0.0–0.1)
Eosinophils Absolute: 0.1 10*3/uL (ref 0.0–0.7)
Lymphocytes Relative: 10.2 % — ABNORMAL LOW (ref 12.0–46.0)
MCHC: 34 g/dL (ref 30.0–36.0)
Monocytes Relative: 8.9 % (ref 3.0–12.0)
Neutrophils Relative %: 79.1 % — ABNORMAL HIGH (ref 43.0–77.0)
Platelets: 419 10*3/uL — ABNORMAL HIGH (ref 150.0–400.0)
RDW: 14.3 % (ref 11.5–14.6)

## 2012-06-13 LAB — BASIC METABOLIC PANEL
CO2: 24 mEq/L (ref 19–32)
Calcium: 9.4 mg/dL (ref 8.4–10.5)
Chloride: 94 mEq/L — ABNORMAL LOW (ref 96–112)
Glucose, Bld: 80 mg/dL (ref 70–99)
Potassium: 4.8 mEq/L (ref 3.5–5.1)
Sodium: 131 mEq/L — ABNORMAL LOW (ref 135–145)

## 2012-06-13 MED ORDER — ONDANSETRON HCL 4 MG PO TABS: 4.0000 mg | ORAL_TABLET | Freq: Four times a day (QID) | ORAL | Status: DC | PRN

## 2012-06-13 MED ORDER — NITROFURANTOIN MONOHYD MACRO 100 MG PO CAPS: 100.0000 mg | ORAL_CAPSULE | Freq: Two times a day (BID) | ORAL | Status: DC

## 2012-06-13 NOTE — ED Notes (Signed)
Per EMS, patient here for evaluation of abdominal pain. Patient s/p lap appendectomy on 05/26/12. Patient from [facility] being sent for dehydration, possible bowel obstruction and acute renal insufficieny. CBG 86 en route by EMS.

## 2012-06-13 NOTE — Assessment & Plan Note (Addendum)
By exam out of proportion to expected postop course, has significant pain without constipation, will check films r/o partial/complete obstruction but also UA - ? UTI - (though no fever/GU symptoms), consider CT for neg UA and elev WBC r/o abscess  Note:  Total time for pt hx, exam, review of record with pt in the room, determination of diagnoses and plan for further eval and tx is > 40 min, with over 50% spent in coordination and counseling of patient

## 2012-06-13 NOTE — Assessment & Plan Note (Signed)
stable overall by history and exam, recent data reviewed with pt, and pt to continue medical treatment as before,  to f/u any worsening symptoms or concerns BP Readings from Last 3 Encounters:  06/13/12 132/58  06/06/12 136/52  06/06/12 136/52

## 2012-06-13 NOTE — ED Notes (Signed)
Patient denies nausea and vomiting today. States she had diarrhea about 4 times today. Nausea and vomiting yesterday. Patient states that she has had a decreased appetite. Patient has staples x 7 to her abdomen. Patient c/o pain to lower abdomen and generalized tenderness to upper abdomen.

## 2012-06-13 NOTE — Assessment & Plan Note (Signed)
Minimal orthostasis on exam, denies dizziness but seems somewhat unsteady on standing, to hold lasix for 3 days, check labs, cont anti-emetic

## 2012-06-13 NOTE — Progress Notes (Signed)
Subjective:    Patient ID: [name], female    DOB: 09-08-1940, 72 y.o.   MRN: [id number]  HPI  Here to f/u recent hospn with acute appendicitis with 7 day hospn, now at [facility] rehab setting brought here helped with family, appears mild ill - pt states overall feels some improvement with the PT, but unfortunately has had several days worsening n/v without blood, and despite antiemetic.  Still with abd pain but pain controlled with postop pain meds.  Denies worsening reflux,  dysphagia,  bowel change or blood, specifically denies constipation, last BM somewhat loose yesterday. No fever, but has loss of appetitie.  Overall good compliance with treatment, and good medicine tolerability, including the lasix, denies signfiicant orthostasic symptoms, main issues seem to be n/v, general weakness, reduced appetite and ongoing lower abd pain.  Denies urinary symptoms such as dysuria, frequency, urgency, flank pain, hematuria,chills. Past Medical History  Diagnosis Date  . HYPERLIPIDEMIA 11/18/2006  . HYPERKALEMIA 02/05/2008  . ANEMIA-IRON DEFICIENCY 09/22/2008  . Anemia of other chronic disease 02/05/2008  . ANXIETY 06/03/2008  . DEPRESSION 04/28/2009  . HYPERTENSION 11/18/2006  . PERICARDITIS 01/03/2007  . AORTIC STENOSIS/ INSUFFICIENCY, NON-RHEUMATIC 12/03/2008  . SINUSITIS- ACUTE-NOS 11/20/2007  . SINUSITIS, CHRONIC 02/05/2008  . ALLERGIC RHINITIS 11/18/2006  . PNEUMONIA 05/02/2007  . C O P D 06/27/2008  . Stricture and stenosis of esophagus 11/14/2008  . GERD 01/03/2007  . BARRETTS ESOPHAGUS 11/14/2008  . RENAL INSUFFICIENCY 02/05/2008  . DYSPEPSIA 03/23/2007  . PRURITUS 10/08/2008  . OSTEOARTHRITIS 11/18/2006  . OSTEOARTHRITIS, KNEE, LEFT 01/03/2007  . Cervicalgia 01/13/2009  . BACK PAIN 02/04/2009  . BURSITIS, LEFT HIP 04/18/2009  . OSTEOPOROSIS 11/18/2006  . INSOMNIA-SLEEP DISORDER-UNSPEC 06/03/2008  . FATIGUE 04/18/2009  . PERIPHERAL EDEMA 02/13/2009  . MURMUR 11/18/2006  . DYSPHAGIA UNSPECIFIED  03/23/2007  . Abdominal pain, generalized 03/01/2007  . Nonspecific (abnormal) findings on radiological and other examination of body structure 06/03/2008  . Tuberculin Test Reaction 11/18/2006  . Acute bronchitis 02/23/2010  . CHOLELITHIASIS 04/24/2010  . NEPHROLITHIASIS, HX OF 04/24/2010  . Polyarthralgia 06/09/2010  . Elevated sed rate 06/11/2010  . Positive ANA (antinuclear antibody) 06/11/2010  . Rheumatoid factor positive 06/11/2010  . Cancer     lung ca  . 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 SPONDYLOSIS, CERVICAL, WITH RADICULOPATHY 01/13/2009  . Cervical radiculopathy 09/28/2010  . Lumbar radiculopathy 09/28/2010   Past Surgical History  Procedure Laterality Date  . Abdominal hysterectomy    . Tubal ligation    . Tumor removal  08/06/08    from lungs  . Anterior cervical decomp/discectomy fusion  01/15/2011    Procedure: ANTERIOR CERVICAL DECOMPRESSION/DISCECTOMY FUSION 2 LEVELS;  Surgeon: Kathaleen Maser Pool;  Location: [facility];  Service: Neurosurgery;  Laterality: N/A;  Cervical Three-Four, Cervical Four-Five Anterior Cervical Decompression Fusion   . Colonscopy    . Nose surgery    . Laparoscopic appendectomy N/A 05/25/2012    Procedure: APPENDECTOMY LAPAROSCOPIC  CONVERTED TO  OPEN APPENDECTOMY, exploratory laparatomy;  Surgeon: Adolph Pollack, MD;  Location: [facility];  Service: General;  Laterality: N/A;  . Bowel resection N/A 05/25/2012    Procedure: SMALL BOWEL RESECTION;  Surgeon: Adolph Pollack, MD;  Location: [facility];  Service: General;  Laterality: N/A;  . Laparoscopic lysis of adhesions N/A 05/25/2012    Procedure: LAPAROSCOPIC LYSIS OF ADHESIONS;  Surgeon: Adolph Pollack, MD;  Location: [facility];  Service: General;  Laterality: N/A;    reports that  she quit smoking about 6 years ago. Her smoking use included Cigarettes. She has a 60 pack-year smoking history. She has quit using smokeless tobacco. She reports that she does not drink alcohol or use illicit drugs. family  history includes Coronary artery disease in her other; Heart disease in her father; Hyperlipidemia in her other; and Hypertension in her other.  There is no history of Colon cancer, and Esophageal cancer, and Rectal cancer, and Stomach cancer, . Allergies  Allergen Reactions  . Amoxicillin Itching  . Fentanyl Other (See Comments)    hallucinations  . Hydrocodone Other (See Comments)    ineffective  . Codeine Hives and Rash  . Penicillins Hives and Rash   Current Outpatient Prescriptions on File Prior to Visit  Medication Sig Dispense Refill  . albuterol (PROVENTIL HFA;VENTOLIN HFA) 108 (90 BASE) MCG/ACT inhaler Inhale 1-2 puffs into the lungs 4 (four) times daily as needed. For wheezing or shortness of breath.      Marland Kitchen alendronate (FOSAMAX) 70 MG tablet Take 1 tablet (70 mg total) by mouth every 7 (seven) days. Take with a full glass of water on an empty stomach. Patient takes on Mondays.  4 tablet  11  . ALPRAZolam (XANAX) 0.5 MG tablet Take 0.5 mg by mouth daily as needed for anxiety.      Marland Kitchen amLODipine (NORVASC) 5 MG tablet Take 1 tablet (5 mg total) by mouth daily.  90 tablet  3  . Cholecalciferol (VITAMIN D3) 1000 UNITS CAPS Take 1 capsule by mouth daily.       . citalopram (CELEXA) 10 MG tablet Take 1 tablet (10 mg total) by mouth daily.  90 tablet  3  . feeding supplement (ENSURE COMPLETE) LIQD Take 237 mLs by mouth daily.      . furosemide (LASIX) 40 MG tablet Take 40 mg by mouth 2 (two) times daily.      Marland Kitchen HYDROcodone-acetaminophen (NORCO) 10-325 MG per tablet TAKE 1 TABLET BY MOUTH EVERY 6 HOURS AS NEEDED FOR PAIN  120 tablet  1  . metoprolol succinate (TOPROL-XL) 25 MG 24 hr tablet Take 1 tablet (25 mg total) by mouth daily.  90 tablet  3  . oxyCODONE (OXY IR/ROXICODONE) 5 MG immediate release tablet Take 1 tablet (5 mg total) by mouth every 6 (six) hours as needed.  30 tablet  0  . pantoprazole (PROTONIX) 40 MG tablet TAKE 1 TABLET (40 MG TOTAL) BY MOUTH DAILY.  90 tablet  3  .  pravastatin (PRAVACHOL) 40 MG tablet Take 1 tablet (40 mg total) by mouth daily.  90 tablet  3  . zolpidem (AMBIEN) 5 MG tablet Take 1 tablet (5 mg total) by mouth at bedtime as needed. For sleep.  30 tablet  0   No current facility-administered medications on file prior to visit.   Review of Systems  Constitutional: Negative for unexpected weight change, or unusual diaphoresis  HENT: Negative for tinnitus.   Eyes: Negative for photophobia and visual disturbance.  Respiratory: Negative for choking and stridor.   Genitourinary: Negative for hematuria and decreased urine volume.  Musculoskeletal: Negative for acute joint swelling Skin: Negative for color change and wound.  Neurological: Negative for tremors and numbness other than noted  Psychiatric/Behavioral: Negative for decreased concentration or  hyperactivity.       Objective:   Physical Exam BP 132/58  Pulse 93  Temp(Src) 97.7 F (36.5 C) (Oral) VS noted, appears fatigued, nauseas, mild ill, walks with walker but able to stand and  walk without assist, needs some assist with stepping up to exam table Constitutional: Pt appears well-developed and well-nourished.  HENT: Head: NCAT.  Right Ear: External ear normal.  Left Ear: External ear normal.  Eyes: Conjunctivae and EOM are normal. Pupils are equal, round, and reactive to light.  Neck: Normal range of motion. Neck supple.  Cardiovascular: Normal rate and regular rhythm.   Pulmonary/Chest: Effort normal and breath sounds normal.  Abd:  Soft,, non-distended, + BS, but mod tender RLQ/mid lower abd without guarding, rebound Neurological: Pt is alert. Not confused  Skin: Skin is warm. No erythema.  Psychiatric: Pt behavior is normal. Thought content normal.     Assessment & Plan:

## 2012-06-13 NOTE — Patient Instructions (Addendum)
Please HOLD the lasix (furosemide) fluid pill for 3 days You will be contacted regarding the referral for: general surgury, asap Please go to the XRAY Department in the Basement (go straight as you get off the elevator) for the x-ray testing Please go to the LAB in the Basement (turn left off the elevator) for the tests to be done today You will be contacted by phone if any changes need to be made immediately.  Otherwise, you will receive a letter about your results with an explanation, but please check with MyChart first. Thank you for enrolling in MyChart. Please follow the instructions below to securely access your online medical record. MyChart allows you to send messages to your doctor, view your test results, renew your prescriptions, schedule appointments, and more. Please return in 1 week, or sooner if needed

## 2012-06-13 NOTE — Telephone Encounter (Signed)
Just received information on acute abd films - has possible bowel obstruction, and labs with acute renal insufficiency  1002 PM  - Spoke on phone with daughter  Luster Landsberg (home [phone], cell [phone]) - informed her pt will need to be referred to ER at [facility] (was most recently hospd there) for further eval and tx, possible NG, IVF's, antibx, and CT abd  1009 pm - spoke with charge nurse at [facility], asked her to arrange transport to [facility] hosp ER

## 2012-06-13 NOTE — Assessment & Plan Note (Signed)
With staples in place, no specific appt sched for f/u with gen surg - will assist with referral

## 2012-06-14 ENCOUNTER — Telehealth: Payer: Self-pay

## 2012-06-14 ENCOUNTER — Emergency Department (HOSPITAL_COMMUNITY): Payer: Medicare Other

## 2012-06-14 DIAGNOSIS — R112 Nausea with vomiting, unspecified: Secondary | ICD-10-CM

## 2012-06-14 LAB — CBC WITH DIFFERENTIAL/PLATELET
Lymphocytes Relative: 17 % (ref 12–46)
Lymphs Abs: 1.1 10*3/uL (ref 0.7–4.0)
MCV: 92.8 fL (ref 78.0–100.0)
Neutrophils Relative %: 64 % (ref 43–77)
Platelets: 347 10*3/uL (ref 150–400)
RBC: 4.19 MIL/uL (ref 3.87–5.11)
WBC: 6.7 10*3/uL (ref 4.0–10.5)

## 2012-06-14 LAB — URINE MICROSCOPIC-ADD ON

## 2012-06-14 LAB — COMPREHENSIVE METABOLIC PANEL
AST: 10 U/L (ref 0–37)
Albumin: 2.9 g/dL — ABNORMAL LOW (ref 3.5–5.2)
Alkaline Phosphatase: 50 U/L (ref 39–117)
BUN: 23 mg/dL (ref 6–23)
Potassium: 4.2 mEq/L (ref 3.5–5.1)
Sodium: 130 mEq/L — ABNORMAL LOW (ref 135–145)
Total Protein: 6.9 g/dL (ref 6.0–8.3)

## 2012-06-14 LAB — URINALYSIS, ROUTINE W REFLEX MICROSCOPIC
Nitrite: NEGATIVE
Specific Gravity, Urine: 1.007 (ref 1.005–1.030)
Urobilinogen, UA: 0.2 mg/dL (ref 0.0–1.0)

## 2012-06-14 LAB — AMYLASE: Amylase: 36 U/L (ref 0–105)

## 2012-06-14 MED ORDER — MORPHINE SULFATE 4 MG/ML IJ SOLN
4.0000 mg | Freq: Once | INTRAMUSCULAR | Status: AC
  Administered 2012-06-14: 4 mg via INTRAVENOUS
  Filled 2012-06-14: qty 1

## 2012-06-14 MED ORDER — SODIUM CHLORIDE 0.9 % IV SOLN
Freq: Once | INTRAVENOUS | Status: AC
  Administered 2012-06-14: 04:00:00 via INTRAVENOUS

## 2012-06-14 MED ORDER — IOHEXOL 300 MG/ML  SOLN
50.0000 mL | Freq: Once | INTRAMUSCULAR | Status: AC | PRN
  Administered 2012-06-14: 50 mL via ORAL

## 2012-06-14 NOTE — Telephone Encounter (Signed)
Called the daughter and family has been informed by MD

## 2012-06-14 NOTE — ED Provider Notes (Signed)
Took over care of pt from [employer].  CT scan showed no definite evidence of intra-abdominal abscess or perforation.  Reevaluated abdominal wound with Dr. Read Drivers who decided to consult Dr. Maisie Fus, general surgery. Packing was removed from RLQ abdominal wound, that is open to peritoneum draining purulent fluent but w/o fecal odor.  Dr. Maisie Fus will have PA evaluate the wound and decide on pt's disposition. Pt has been cleared from a surgical standpoint.  Will discharge pt back to [facility] with f/u with her surgeon Dr. Abbey Chatters tomorrow.   Vitals: unremarkable. Discharged in stable condition.    Discussed pt with attending during ED encounter.   Medical screening examination/treatment/procedure(s) were conducted as a shared visit with non-physician practitioner(s) and myself.  I personally evaluated the patient during the encounter  Bandage and packing removed by myself to permit examination. Wound edges clean without evidence of cellulitis. Wound opens into peritoneum with greyish drainage without feculent or foul odor. Minimal patient discomfort during exam. As noted above, I spoke with Dr. Maisie Fus who had her PA evaluate the patient and determine disposition.   Junius Finner, PA-C 06/14/12 0914  Junius Finner, PA-C 06/14/12 4782  Hanley Seamen, MD 06/14/12 [phone]

## 2012-06-14 NOTE — Consult Note (Signed)
ATTENDING ADDENDUM:  I personally reviewed patient's record, examined the patient, and formulated the following assessment and plan:  I do not see any signs of wound infection.  Packed wound with good granulation tissue.  No signs of post surgical complications that could be causing her nausea and vomiting.  I recommend continued medical work up of her illness.  Can follow up in the office with her original surgeon.

## 2012-06-14 NOTE — ED Provider Notes (Signed)
History     CSN: [id number]  Arrival date & time 06/13/12  2329   First MD Initiated Contact with Patient 06/14/12 0210      Chief Complaint  Patient presents with  . Abdominal Pain    (Consider location/radiation/quality/duration/timing/severity/associated sxs/prior treatment) Patient is a 72 y.o. female presenting with abdominal pain.  Abdominal Pain History provided by pt and prior chart.  Per prior chart, pt underwent ex lap 05/25/12 for gangrenous, perforated appendicitis w/ diffuse peritonitis.  Pt presents today from rehab facility w/ c/o persistent RLQ pain, N/V/D, all of which started shortly following procedure.   PCP obtained an acute abdominal series that was concerning for SBO and pt referred to ED.  She denies fever, hematemesis/hematochezia/melena, urinary and vaginal symptoms.  Has not yet followed up with her general surgeon.    Past Medical History  Diagnosis Date  . HYPERLIPIDEMIA 11/18/2006  . HYPERKALEMIA 02/05/2008  . ANEMIA-IRON DEFICIENCY 09/22/2008  . Anemia of other chronic disease 02/05/2008  . ANXIETY 06/03/2008  . DEPRESSION 04/28/2009  . HYPERTENSION 11/18/2006  . PERICARDITIS 01/03/2007  . AORTIC STENOSIS/ INSUFFICIENCY, NON-RHEUMATIC 12/03/2008  . SINUSITIS- ACUTE-NOS 11/20/2007  . SINUSITIS, CHRONIC 02/05/2008  . ALLERGIC RHINITIS 11/18/2006  . PNEUMONIA 05/02/2007  . C O P D 06/27/2008  . Stricture and stenosis of esophagus 11/14/2008  . GERD 01/03/2007  . BARRETTS ESOPHAGUS 11/14/2008  . RENAL INSUFFICIENCY 02/05/2008  . DYSPEPSIA 03/23/2007  . PRURITUS 10/08/2008  . OSTEOARTHRITIS 11/18/2006  . OSTEOARTHRITIS, KNEE, LEFT 01/03/2007  . Cervicalgia 01/13/2009  . BACK PAIN 02/04/2009  . BURSITIS, LEFT HIP 04/18/2009  . OSTEOPOROSIS 11/18/2006  . INSOMNIA-SLEEP DISORDER-UNSPEC 06/03/2008  . FATIGUE 04/18/2009  . PERIPHERAL EDEMA 02/13/2009  . MURMUR 11/18/2006  . DYSPHAGIA UNSPECIFIED 03/23/2007  . Abdominal pain, generalized 03/01/2007  . Nonspecific  (abnormal) findings on radiological and other examination of body structure 06/03/2008  . Tuberculin Test Reaction 11/18/2006  . Acute bronchitis 02/23/2010  . CHOLELITHIASIS 04/24/2010  . NEPHROLITHIASIS, HX OF 04/24/2010  . Polyarthralgia 06/09/2010  . Elevated sed rate 06/11/2010  . Positive ANA (antinuclear antibody) 06/11/2010  . Rheumatoid factor positive 06/11/2010  . Cancer     lung ca  . 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 SPONDYLOSIS, CERVICAL, WITH RADICULOPATHY 01/13/2009  . Cervical radiculopathy 09/28/2010  . Lumbar radiculopathy 09/28/2010    Past Surgical History  Procedure Laterality Date  . Abdominal hysterectomy    . Tubal ligation    . Tumor removal  08/06/08    from lungs  . Anterior cervical decomp/discectomy fusion  01/15/2011    Procedure: ANTERIOR CERVICAL DECOMPRESSION/DISCECTOMY FUSION 2 LEVELS;  Surgeon: Kathaleen Maser Pool;  Location: [facility];  Service: Neurosurgery;  Laterality: N/A;  Cervical Three-Four, Cervical Four-Five Anterior Cervical Decompression Fusion   . Colonscopy    . Nose surgery    . Laparoscopic appendectomy N/A 05/25/2012    Procedure: APPENDECTOMY LAPAROSCOPIC  CONVERTED TO  OPEN APPENDECTOMY, exploratory laparatomy;  Surgeon: Adolph Pollack, MD;  Location: [facility];  Service: General;  Laterality: N/A;  . Bowel resection N/A 05/25/2012    Procedure: SMALL BOWEL RESECTION;  Surgeon: Adolph Pollack, MD;  Location: [facility];  Service: General;  Laterality: N/A;  . Laparoscopic lysis of adhesions N/A 05/25/2012    Procedure: LAPAROSCOPIC LYSIS OF ADHESIONS;  Surgeon: Adolph Pollack, MD;  Location: [facility];  Service: General;  Laterality: N/A;    Family History  Problem Relation Age of Onset  . Hyperlipidemia Other   .  Hypertension Other   . Coronary artery disease Other     several nephrew  . Heart disease Father   . Colon cancer Neg Hx   . Esophageal cancer Neg Hx   . Rectal cancer Neg Hx   . Stomach cancer Neg Hx     History   Substance Use Topics  . Smoking status: Former Smoker -- 1.50 packs/day for 40 years    Types: Cigarettes    Quit date: 02/15/2006  . Smokeless tobacco: Former Neurosurgeon  . Alcohol Use: No    OB History   Grav Para Term Preterm Abortions TAB SAB Ect Mult Living                  Review of Systems  Gastrointestinal: Positive for abdominal pain.  All other systems reviewed and are negative.    Allergies  Amoxicillin; Fentanyl; Hydrocodone; Codeine; and Penicillins  Home Medications   Current Outpatient Rx  Name  Route  Sig  Dispense  Refill  . alendronate (FOSAMAX) 70 MG tablet   Oral   Take 1 tablet (70 mg total) by mouth every 7 (seven) days. Take with a full glass of water on an empty stomach. Patient takes on Mondays.   4 tablet   11   . amLODipine (NORVASC) 5 MG tablet   Oral   Take 1 tablet (5 mg total) by mouth daily.   90 tablet   3   . citalopram (CELEXA) 10 MG tablet   Oral   Take 1 tablet (10 mg total) by mouth daily.   90 tablet   3   . furosemide (LASIX) 40 MG tablet   Oral   Take 40 mg by mouth 2 (two) times daily.         . metoprolol succinate (TOPROL-XL) 25 MG 24 hr tablet   Oral   Take 1 tablet (25 mg total) by mouth daily.   90 tablet   3   . ondansetron (ZOFRAN) 4 MG tablet   Oral   Take 1 tablet (4 mg total) by mouth every 6 (six) hours as needed.   30 tablet   0   . oxyCODONE (OXY IR/ROXICODONE) 5 MG immediate release tablet   Oral   Take 1 tablet (5 mg total) by mouth every 6 (six) hours as needed.   30 tablet   0   . pantoprazole (PROTONIX) 40 MG tablet      TAKE 1 TABLET (40 MG TOTAL) BY MOUTH DAILY.   90 tablet   3   . polyethylene glycol (MIRALAX / GLYCOLAX) packet   Oral   Take 17 g by mouth daily.         . pravastatin (PRAVACHOL) 40 MG tablet   Oral   Take 1 tablet (40 mg total) by mouth daily.   90 tablet   3   . senna (SENOKOT) 8.6 MG TABS   Oral   Take 2 tablets by mouth daily.         Marland Kitchen albuterol  (PROVENTIL HFA;VENTOLIN HFA) 108 (90 BASE) MCG/ACT inhaler   Inhalation   Inhale 1-2 puffs into the lungs 4 (four) times daily as needed. For wheezing or shortness of breath.         . ALPRAZolam (XANAX) 0.5 MG tablet   Oral   Take 0.5 mg by mouth daily as needed for anxiety.         . feeding supplement (ENSURE COMPLETE) LIQD   Oral  Take 237 mLs by mouth daily.         Marland Kitchen HYDROcodone-acetaminophen (NORCO) 10-325 MG per tablet      TAKE 1 TABLET BY MOUTH EVERY 6 HOURS AS NEEDED FOR PAIN   120 tablet   1   . nitrofurantoin, macrocrystal-monohydrate, (MACROBID) 100 MG capsule   Oral   Take 1 capsule (100 mg total) by mouth 2 (two) times daily.   14 capsule   0   . zolpidem (AMBIEN) 5 MG tablet   Oral   Take 1 tablet (5 mg total) by mouth at bedtime as needed. For sleep.   30 tablet   0     BP 127/49  Pulse 80  Temp(Src) 98.2 F (36.8 C) (Oral)  Resp 18  SpO2 94%  Physical Exam  Nursing note and vitals reviewed. Constitutional: She is oriented to person, place, and time. She appears well-developed and well-nourished. No distress.  HENT:  Head: Normocephalic and atraumatic.  Eyes:  Normal appearance  Neck: Normal range of motion.  Cardiovascular: Normal rate and regular rhythm.   Pulmonary/Chest: Effort normal and breath sounds normal. No respiratory distress.  Abdominal: Soft. Bowel sounds are normal. She exhibits no distension and no mass. There is no rebound and no guarding.  Open, packed surgical wound that extends from mid-line abd through RLQ.  No surrounding erythema.  Small amt purulent drainage on packing.  Abdomen diffusely tender, worst at RLQ.   Genitourinary:  No CVA tenderness  Musculoskeletal: Normal range of motion.  Neurological: She is alert and oriented to person, place, and time.  Skin: Skin is warm and dry. No rash noted.  Psychiatric: She has a normal mood and affect. Her behavior is normal.    ED Course  Procedures (including  critical care time)  Labs Reviewed  COMPREHENSIVE METABOLIC PANEL - Abnormal; Notable for the following:    Sodium 130 (*)    Chloride 93 (*)    Creatinine, Ser 1.82 (*)    Albumin 2.9 (*)    GFR calc non Af Amer 27 (*)    GFR calc Af Amer 31 (*)    All other components within normal limits  URINALYSIS, ROUTINE W REFLEX MICROSCOPIC - Abnormal; Notable for the following:    Hgb urine dipstick SMALL (*)    Leukocytes, UA MODERATE (*)    All other components within normal limits  CBC WITH DIFFERENTIAL - Abnormal; Notable for the following:    Monocytes Relative 17 (*)    Monocytes Absolute 1.1 (*)    All other components within normal limits  CLOSTRIDIUM DIFFICILE BY PCR  AMYLASE  LIPASE, BLOOD  URINE MICROSCOPIC-ADD ON  CBC WITH DIFFERENTIAL   Ct Abdomen Pelvis Wo Contrast  06/14/2012  *RADIOLOGY REPORT*  Clinical Data: Nausea, vomiting, diarrhea, lower abdominal pain, upper abdominal tenderness.  History of appendectomy and small bowel resection.  CT ABDOMEN AND PELVIS WITHOUT CONTRAST  Technique:  Multidetector CT imaging of the abdomen and pelvis was performed following the standard protocol without intravenous contrast.  Comparison: 05/25/2012  Findings: Atelectasis or infiltration in the right lung base. Cardiac enlargement.  Calcification in the aorta and coronary arteries.  Cholelithiasis with multiple stones in the gallbladder.  No change. Unenhanced appearance of the liver, spleen, pancreas, adrenal glands, inferior vena cava, and retroperitoneal lymph nodes is unremarkable.  Bilateral intrarenal renal stones measuring 3 mm on the right and 3 mm on the left without evidence of obstruction.  No ureteral stones demonstrated.  The stomach,  small bowel, and colon are unremarkable.  No abnormal wall thickening is appreciated.  No free air or free fluid in the abdomen.  Pelvis:  There is infiltration in the subcutaneous fat of the right lower quadrant with subcutaneous gas collections in  the right lower quadrant and right flank muscles and right rectus abdominous muscle.  This is likely postoperative, with interval appendectomy. Cellulitis is not excluded.  No loculated fluid collection.  The bowel anastomoses is noted in the deep pelvis.  This appears to involve small bowel.  The area of the anastomosis is mildly enlarged and contains stool.  Surgical staples surround this area suggesting that it is not extraluminal abscess.  Infiltration in the pelvic fat is likely postoperative.  Sigmoid colon is decompressed.  No free or loculated pelvic fluid collections. Bladder wall is not thickened.  Degenerative changes in the lumbar spine.  IMPRESSION: Interval postoperative changes with resection of the appendix and with postoperative versus inflammatory infiltration and gas in the right lower quadrant subcutaneous fat and in the right rectus abdominous and inferior flank muscles.  No definite evidence of intra-abdominal abscess or perforation.  Cholelithiasis again demonstrated.  Atelectasis in the right lung base.   Original Report Authenticated By: Burman Nieves, M.D.    Dg Abd Acute W/chest  06/13/2012  *RADIOLOGY REPORT*  Clinical Data: Right lower quadrant pain.  Abdominal pain.  Nausea, vomiting.  ACUTE ABDOMEN SERIES (ABDOMEN 2 VIEW & CHEST 1 VIEW)  Comparison: Chest 06/05/2012  Findings: Shallow inspiration.  Cardiac enlargement with mild pulmonary vascular congestion.  No edema.  Atelectasis in the lung bases.  No focal consolidation.  No blunting of costophrenic angles.  No pneumothorax.  Postoperative change consistent with left thoracotomy at the left fifth rib.  Surgical clips in the mediastinum.  Calcified and tortuous aorta.  Postoperative changes in the cervical spine.  The stomach is distended and filled with mixed solid and gaseous substances.  This is likely represent ingested material but could represent a bezoar.  There are a few gas-filled small bowel loops visualized in the  mid abdomen with multiple air-fluid levels noted on the upright view.  Changes are worrisome for bowel obstruction. Adynamic ileus is possible but considered less likely.  No free intra-abdominal air.  Skin clips in the right lower quadrant and midline.  No radiopaque stones.  Degenerative changes in the lumbar spine.  IMPRESSION: Distended stomach with mixed gaseous and solid materials.  No abdominal small bowel distension.  Air-fluid levels.  Changes suggest bowel obstruction.   Original Report Authenticated By: Burman Nieves, M.D.      No diagnosis found.    MDM  71yo F, 2.5wkss/p ex lap for perforated, gangrenous appendicitis w/ peritonitis, presents w/ persistent RLQ pain, N/V/D.  Afebrile, NAD, abd soft/non-distended, diffuse ttp, particularly at RLQ, open surgical wound across lower abd w/ purulent drainage on packing.  Labs unremarkable.  Consulted Dr. Daphine Deutscher, GS, who recommends CT abd/pelvis and c.diff culture.  Pt has received morphine for pain.  OMalley, PA-C to resume care.          Otilio Miu, PA-C 06/14/12 [phone]

## 2012-06-14 NOTE — Telephone Encounter (Signed)
Message copied by Pincus Sanes on Wed Jun 14, 2012  8:04 AM ------      Message from: Corwin Levins      Created: Tue Jun 13, 2012  6:04 PM      Regarding: renal insufficiency  - new       Left message on MyChart, pt to cont same tx            The test results show that your current treatment is OK, except the kidney tests show that you have acute renal insufficiency, which most likely will require IV fluids in the hospital to correct in light of your nausea and vomiting.  You should report as soon as possible to the ER where you were last hospitalized, and you should get IV fluid treatment.    There is also a possible Urinary Tract infection, which could account for the abd pain and vomiting.  I will send an antibiotic, but if you are unable to keep the pills down, you should also report this at the ER.            Robin to call pt or family asap - to go to ER for IVF's  ------
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
## 2012-06-14 NOTE — Consult Note (Signed)
Reason for Consult: Nausea, vomiting and some diarrhea Referring Physician: ER Arie Sabina Schinlever  PA  [name] is an 72 y.o. female.  HPI: Patient is a 72 year old female who was admitted with an acute perforated appendix and generalized peritonitis 05/25/12. She underwent laparoscopy with exploratory laparotomy, lysis of adhesions, appendectomy and small bowel resection by Dr. Avel Peace.  She remained hospitalized was discharged on 06/06/12. Postoperatively she had an ileus some problems with diarrhea, problems with mobilization. She had an open wound that was packed wet to dry and was ultimately discharged to a skilled nursing facility. As noted below she has multiple chronic medical issues which compounded her postoperative course. She was sent back to the ER after being seen by her primary care. She reports intermittent bouts of diarrhea, nausea and some vomiting. In the ER she was dehydrated with worsening renal function. A CT scan showed cholelithiasis with multiple stones in the gallbladder but no other changes. She has bilateral intrarenal kidney stones without obstruction. There was some subcutaneous gas in the right lower quadrant and the flank muscle rectus abdominis consistent with postoperative changes there were no loculated fluid collections. The area of the anastomosis appeared enlarged and contained some stool but no suggestion of extraluminal abscess, sigmoid colon was decompressed bladder wall was not thickened. Dr. Maisie Fus was asked to see her in the ER for consultation.  Past Medical History  Diagnosis Date  . HYPERLIPIDEMIA 11/18/2006  . HYPERKALEMIA 02/05/2008  . ANEMIA-IRON DEFICIENCY 09/22/2008  . Anemia of other chronic disease 02/05/2008  . ANXIETY 06/03/2008  . DEPRESSION 04/28/2009  . HYPERTENSION 11/18/2006  . PERICARDITIS 01/03/2007  . AORTIC STENOSIS/ INSUFFICIENCY, NON-RHEUMATIC 12/03/2008  . SINUSITIS- ACUTE-NOS 11/20/2007  . SINUSITIS, CHRONIC 02/05/2008   . ALLERGIC RHINITIS 11/18/2006  . PNEUMONIA 05/02/2007  . C O P D 06/27/2008  . Stricture and stenosis of esophagus 11/14/2008  . GERD 01/03/2007  . BARRETTS ESOPHAGUS 11/14/2008  . RENAL INSUFFICIENCY 02/05/2008  . DYSPEPSIA 03/23/2007  . PRURITUS 10/08/2008  . OSTEOARTHRITIS 11/18/2006  . OSTEOARTHRITIS, KNEE, LEFT 01/03/2007  . Cervicalgia 01/13/2009  . BACK PAIN 02/04/2009  . BURSITIS, LEFT HIP 04/18/2009  . OSTEOPOROSIS 11/18/2006  . INSOMNIA-SLEEP DISORDER-UNSPEC 06/03/2008  . FATIGUE 04/18/2009  . PERIPHERAL EDEMA 02/13/2009  . MURMUR 11/18/2006  . DYSPHAGIA UNSPECIFIED 03/23/2007  . Abdominal pain, generalized 03/01/2007  . Nonspecific (abnormal) findings on radiological and other examination of body structure 06/03/2008  . Tuberculin Test Reaction 11/18/2006  . Acute bronchitis 02/23/2010  . CHOLELITHIASIS 04/24/2010  . NEPHROLITHIASIS, HX OF 04/24/2010  . Polyarthralgia 06/09/2010  . Elevated sed rate 06/11/2010  . Positive ANA (antinuclear antibody) 06/11/2010  . Rheumatoid factor positive 06/11/2010  . Cancer     lung ca  . 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 SPONDYLOSIS, CERVICAL, WITH RADICULOPATHY 01/13/2009  . Cervical radiculopathy 09/28/2010  . Lumbar radiculopathy 09/28/2010    Past Surgical History  Procedure Laterality Date  . Abdominal hysterectomy    . Tubal ligation    . Tumor removal  08/06/08    from lungs  . Anterior cervical decomp/discectomy fusion  01/15/2011    Procedure: ANTERIOR CERVICAL DECOMPRESSION/DISCECTOMY FUSION 2 LEVELS;  Surgeon: Kathaleen Maser Pool;  Location: [facility];  Service: Neurosurgery;  Laterality: N/A;  Cervical Three-Four, Cervical Four-Five Anterior Cervical Decompression Fusion   . Colonscopy    . Nose surgery    . Laparoscopic appendectomy N/A 05/25/2012    Procedure: APPENDECTOMY LAPAROSCOPIC  CONVERTED TO  OPEN APPENDECTOMY,  exploratory laparatomy;  Surgeon: Adolph Pollack, MD;  Location: [facility];  Service: General;  Laterality: N/A;  .  Bowel resection N/A 05/25/2012    Procedure: SMALL BOWEL RESECTION;  Surgeon: Adolph Pollack, MD;  Location: [facility];  Service: General;  Laterality: N/A;  . Laparoscopic lysis of adhesions N/A 05/25/2012    Procedure: LAPAROSCOPIC LYSIS OF ADHESIONS;  Surgeon: Adolph Pollack, MD;  Location: [facility];  Service: General;  Laterality: N/A;    Family History  Problem Relation Age of Onset  . Hyperlipidemia Other   . Hypertension Other   . Coronary artery disease Other     several nephrew  . Heart disease Father   . Colon cancer Neg Hx   . Esophageal cancer Neg Hx   . Rectal cancer Neg Hx   . Stomach cancer Neg Hx     Social History:  reports that she quit smoking about 6 years ago. Her smoking use included Cigarettes. She has a 60 pack-year smoking history. She has quit using smokeless tobacco. She reports that she does not drink alcohol or use illicit drugs.  Allergies:  Allergies  Allergen Reactions  . Amoxicillin Itching  . Fentanyl Other (See Comments)    hallucinations  . Hydrocodone Other (See Comments)    ineffective  . Codeine Hives and Rash  . Penicillins Hives and Rash    Medications:  Prior to Admission:  (Not in a hospital admission) Scheduled: Continuous: PRN:   Anti-infectives   None      Results for orders placed during the hospital encounter of 06/13/12 (from the past 48 hour(s))  COMPREHENSIVE METABOLIC PANEL     Status: Abnormal   Collection Time    06/13/12 11:49 PM      Result Value Range   Sodium 130 (*) 135 - 145 mEq/L   Potassium 4.2  3.5 - 5.1 mEq/L   Chloride 93 (*) 96 - 112 mEq/L   CO2 19  19 - 32 mEq/L   Glucose, Bld 79  70 - 99 mg/dL   BUN 23  6 - 23 mg/dL   Creatinine, Ser 1.61 (*) 0.50 - 1.10 mg/dL   Calcium 9.3  8.4 - 09.6 mg/dL   Total Protein 6.9  6.0 - 8.3 g/dL   Albumin 2.9 (*) 3.5 - 5.2 g/dL   AST 10  0 - 37 U/L   ALT 10  0 - 35 U/L   Alkaline Phosphatase 50  39 - 117 U/L   Total Bilirubin 0.3  0.3 - 1.2 mg/dL   GFR  calc non Af Amer 27 (*) >90 mL/min   GFR calc Af Amer 31 (*) >90 mL/min   Comment:            The eGFR has been calculated     using the CKD EPI equation.     This calculation has not been     validated in all clinical     situations.     eGFR's persistently     <90 mL/min signify     possible Chronic Kidney Disease.  AMYLASE     Status: None   Collection Time    06/13/12 11:49 PM      Result Value Range   Amylase 36  0 - 105 U/L  LIPASE, BLOOD     Status: None   Collection Time    06/13/12 11:49 PM      Result Value Range   Lipase 23  11 - 59  U/L  URINALYSIS, ROUTINE W REFLEX MICROSCOPIC     Status: Abnormal   Collection Time    06/14/12  1:26 AM      Result Value Range   Color, Urine YELLOW  YELLOW   APPearance CLEAR  CLEAR   Specific Gravity, Urine 1.007  1.005 - 1.030   pH 5.0  5.0 - 8.0   Glucose, UA NEGATIVE  NEGATIVE mg/dL   Hgb urine dipstick SMALL (*) NEGATIVE   Bilirubin Urine NEGATIVE  NEGATIVE   Ketones, ur NEGATIVE  NEGATIVE mg/dL   Protein, ur NEGATIVE  NEGATIVE mg/dL   Urobilinogen, UA 0.2  0.0 - 1.0 mg/dL   Nitrite NEGATIVE  NEGATIVE   Leukocytes, UA MODERATE (*) NEGATIVE  URINE MICROSCOPIC-ADD ON     Status: None   Collection Time    06/14/12  1:26 AM      Result Value Range   WBC, UA 7-10  <3 WBC/hpf   RBC / HPF 0-2  <3 RBC/hpf   Bacteria, UA RARE  RARE  CBC WITH DIFFERENTIAL     Status: Abnormal   Collection Time    06/14/12  2:40 AM      Result Value Range   WBC 6.7  4.0 - 10.5 K/uL   RBC 4.19  3.87 - 5.11 MIL/uL   Hemoglobin 13.1  12.0 - 15.0 g/dL   HCT 16.1  09.6 - 04.5 %   MCV 92.8  78.0 - 100.0 fL   MCH 31.3  26.0 - 34.0 pg   MCHC 33.7  30.0 - 36.0 g/dL   RDW 40.9  81.1 - 91.4 %   Platelets 347  150 - 400 K/uL   Neutrophils Relative 64  43 - 77 %   Neutro Abs 4.3  1.7 - 7.7 K/uL   Lymphocytes Relative 17  12 - 46 %   Lymphs Abs 1.1  0.7 - 4.0 K/uL   Monocytes Relative 17 (*) 3 - 12 %   Monocytes Absolute 1.1 (*) 0.1 - 1.0 K/uL    Eosinophils Relative 2  0 - 5 %   Eosinophils Absolute 0.2  0.0 - 0.7 K/uL   Basophils Relative 0  0 - 1 %   Basophils Absolute 0.0  0.0 - 0.1 K/uL    Ct Abdomen Pelvis Wo Contrast  06/14/2012  *RADIOLOGY REPORT*  Clinical Data: Nausea, vomiting, diarrhea, lower abdominal pain, upper abdominal tenderness.  History of appendectomy and small bowel resection.  CT ABDOMEN AND PELVIS WITHOUT CONTRAST  Technique:  Multidetector CT imaging of the abdomen and pelvis was performed following the standard protocol without intravenous contrast.  Comparison: 05/25/2012  Findings: Atelectasis or infiltration in the right lung base. Cardiac enlargement.  Calcification in the aorta and coronary arteries.  Cholelithiasis with multiple stones in the gallbladder.  No change. Unenhanced appearance of the liver, spleen, pancreas, adrenal glands, inferior vena cava, and retroperitoneal lymph nodes is unremarkable.  Bilateral intrarenal renal stones measuring 3 mm on the right and 3 mm on the left without evidence of obstruction.  No ureteral stones demonstrated.  The stomach, small bowel, and colon are unremarkable.  No abnormal wall thickening is appreciated.  No free air or free fluid in the abdomen.  Pelvis:  There is infiltration in the subcutaneous fat of the right lower quadrant with subcutaneous gas collections in the right lower quadrant and right flank muscles and right rectus abdominous muscle.  This is likely postoperative, with interval appendectomy. Cellulitis is not excluded.  No  loculated fluid collection.  The bowel anastomoses is noted in the deep pelvis.  This appears to involve small bowel.  The area of the anastomosis is mildly enlarged and contains stool.  Surgical staples surround this area suggesting that it is not extraluminal abscess.  Infiltration in the pelvic fat is likely postoperative.  Sigmoid colon is decompressed.  No free or loculated pelvic fluid collections. Bladder wall is not thickened.   Degenerative changes in the lumbar spine.  IMPRESSION: Interval postoperative changes with resection of the appendix and with postoperative versus inflammatory infiltration and gas in the right lower quadrant subcutaneous fat and in the right rectus abdominous and inferior flank muscles.  No definite evidence of intra-abdominal abscess or perforation.  Cholelithiasis again demonstrated.  Atelectasis in the right lung base.   Original Report Authenticated By: Burman Nieves, M.D.    Dg Abd Acute W/chest  06/13/2012  *RADIOLOGY REPORT*  Clinical Data: Right lower quadrant pain.  Abdominal pain.  Nausea, vomiting.  ACUTE ABDOMEN SERIES (ABDOMEN 2 VIEW & CHEST 1 VIEW)  Comparison: Chest 06/05/2012  Findings: Shallow inspiration.  Cardiac enlargement with mild pulmonary vascular congestion.  No edema.  Atelectasis in the lung bases.  No focal consolidation.  No blunting of costophrenic angles.  No pneumothorax.  Postoperative change consistent with left thoracotomy at the left fifth rib.  Surgical clips in the mediastinum.  Calcified and tortuous aorta.  Postoperative changes in the cervical spine.  The stomach is distended and filled with mixed solid and gaseous substances.  This is likely represent ingested material but could represent a bezoar.  There are a few gas-filled small bowel loops visualized in the mid abdomen with multiple air-fluid levels noted on the upright view.  Changes are worrisome for bowel obstruction. Adynamic ileus is possible but considered less likely.  No free intra-abdominal air.  Skin clips in the right lower quadrant and midline.  No radiopaque stones.  Degenerative changes in the lumbar spine.  IMPRESSION: Distended stomach with mixed gaseous and solid materials.  No abdominal small bowel distension.  Air-fluid levels.  Changes suggest bowel obstruction.   Original Report Authenticated By: Burman Nieves, M.D.     Review of Systems  Constitutional: Negative for fever, chills and  diaphoresis.       She does not know about wt. Loss.  HENT: Negative.   Eyes: Negative.   Respiratory: Negative.   Cardiovascular: Negative.   Gastrointestinal: Positive for heartburn, nausea and vomiting.  Genitourinary:       She thinks it has an odor, but nurse at facility did not  Musculoskeletal: Positive for myalgias.  Skin: Negative.   Neurological: Positive for weakness. Negative for dizziness, tingling, tremors, sensory change, speech change, focal weakness, seizures and loss of consciousness.  Endo/Heme/Allergies: Negative.   Psychiatric/Behavioral: Negative.    Blood pressure 127/49, pulse 80, temperature 98.2 F (36.8 C), temperature source Oral, resp. rate 18, SpO2 94.00%. Physical Exam  Constitutional: She is oriented to person, place, and time. She appears well-developed and well-nourished.  Overweight female  HENT:  Head: Normocephalic and atraumatic.  Nose: Nose normal.  Mouth/Throat: No oropharyngeal exudate.  Eyes: Conjunctivae and EOM are normal. Pupils are equal, round, and reactive to light. Right eye exhibits no discharge. Left eye exhibits no discharge. No scleral icterus.  Neck: Normal range of motion. Neck supple. No JVD present. No tracheal deviation present. No thyromegaly present.  Scar just above the sternal notch  Cardiovascular: Normal rate, regular rhythm and intact distal pulses.  Exam reveals no gallop and no friction rub.   Murmur (II-III/VI aortic mumur) heard. Respiratory: Effort normal and breath sounds normal. No respiratory distress. She has no wheezes. She has no rales. She exhibits no tenderness.  GI: Soft. Bowel sounds are normal. She exhibits no distension and no mass. There is tenderness (sore around the open wound RLQ). There is no rebound and no guarding.  Open abdominal wound RLQ, clean and pink.  There is some undermining of it and this needs to be packed also.  Musculoskeletal: She exhibits no edema.  Lymphadenopathy:    She has no  cervical adenopathy.  Neurological: She is alert and oriented to person, place, and time.  Skin: Skin is warm and dry. No rash noted. No erythema. No pallor.  Psychiatric: She has a normal mood and affect. Her behavior is normal. Judgment and thought content normal.    Assessment/Plan: 1. Nausea, vomiting, diarrhea, with dehydration. 2. Mild renal insufficiency 3. Postop support for laparotomy lysis of adhesions appendectomy small bowel resection with open right lower quadrant incision. This is currently being packed wet to dry twice a day. It is clean there is some undermining but it is healing with no signs of infection. It needs to be packed to the full depth. 4. Multiple medical problems as listed above. 5. Aortic stenosis and aortic insufficiency; postoperative 2 diastolic congestive heart failure. 6. COPD/history of lung cancer 7. History of esophageal stricture and stenosis 8. Chronic kidney disease   Plan: Patient was seen in the ER and evaluated by Dr. Maisie Fus. It is her opinion the incision is healing well. She has no peritonitis or signs of postoperative infection. She recommended ongoing twice a day dressing changes. Skin staples were removed. She recommended medical evaluation and treatment of her nausea and vomiting.  [facility] physician assistant for Dr. Romie Levee.  Dandra Shambaugh 06/14/2012, 9:12 AM

## 2012-06-19 ENCOUNTER — Encounter (INDEPENDENT_AMBULATORY_CARE_PROVIDER_SITE_OTHER): Payer: Medicare Other | Admitting: General Surgery

## 2012-06-21 ENCOUNTER — Ambulatory Visit (INDEPENDENT_AMBULATORY_CARE_PROVIDER_SITE_OTHER): Payer: Medicare Other | Admitting: General Surgery

## 2012-06-21 ENCOUNTER — Encounter (INDEPENDENT_AMBULATORY_CARE_PROVIDER_SITE_OTHER): Payer: Self-pay | Admitting: General Surgery

## 2012-06-21 VITALS — BP 152/60 | HR 80 | Temp 98.0°F | Resp 12 | Ht 62.0 in | Wt 150.0 lb

## 2012-06-21 DIAGNOSIS — Z9889 Other specified postprocedural states: Secondary | ICD-10-CM

## 2012-06-21 NOTE — Progress Notes (Signed)
Procedure:  Laparoscopic converted to open appendectomy  Date:  05/25/2012  Pathology:  Acute appendicitis with perforation and necrosis  History:  She is here for her first postoperative visit. She is in a skilled nursing facility. She is slowly getting better. She has a right lower quadrant wound healing by secondary intention. Twice a day dressing changes are being performed.  Exam: General- Is in NAD.  She is in a wheelchair.  Abdomen-soft, right lower quadrant wound is clean with good granulation tissue. Small trocar wounds are clean and healed.  Assessment:  She is slowly improving overall. Wound is healing in well by secondary intention.  Plan:  Continue twice a day dressing changes. Return visit one month. I did explain to her and her family member that it could take her 3-6 months to get her full strength back.

## 2012-06-21 NOTE — Patient Instructions (Signed)
Continue twice a day dressing changes.

## 2012-06-22 ENCOUNTER — Encounter: Payer: Self-pay | Admitting: Internal Medicine

## 2012-06-22 ENCOUNTER — Ambulatory Visit (INDEPENDENT_AMBULATORY_CARE_PROVIDER_SITE_OTHER): Payer: Medicare Other | Admitting: Internal Medicine

## 2012-06-22 VITALS — BP 112/70 | HR 83 | Temp 97.6°F | Wt 158.1 lb

## 2012-06-22 DIAGNOSIS — M25511 Pain in right shoulder: Secondary | ICD-10-CM

## 2012-06-22 DIAGNOSIS — R609 Edema, unspecified: Secondary | ICD-10-CM

## 2012-06-22 DIAGNOSIS — M25476 Effusion, unspecified foot: Secondary | ICD-10-CM

## 2012-06-22 DIAGNOSIS — M25519 Pain in unspecified shoulder: Secondary | ICD-10-CM

## 2012-06-22 DIAGNOSIS — M25471 Effusion, right ankle: Secondary | ICD-10-CM | POA: Insufficient documentation

## 2012-06-22 MED ORDER — PREDNISONE 10 MG PO TABS: ORAL_TABLET | ORAL | Status: DC

## 2012-06-22 NOTE — Assessment & Plan Note (Signed)
?   Strain vs rotater cuff issue- for tx as above, if not improved in 2-3 wks will need ortho referral, f/u here at that time

## 2012-06-22 NOTE — Patient Instructions (Signed)
Please take all new medication as prescribed- the short course of prednisone Please continue all other medications as before Please continue the PT and f/u with [facility] as you have planned The FMLA form should be filled out for pickup in a few business days

## 2012-06-22 NOTE — Progress Notes (Signed)
Subjective:    Patient ID: [name], female    DOB: [birth year], 72 y.o.   MRN: [id number]  HPI  Here after recent hospn for ? Bowel obstruction, CT neg, tx conservatively and fortunately has done nicely with resolution of n/v, saw surgeon yesterday, and has f/u planned again in 1 mo.  No further n/v, diarrhea.  Today states just "feels bad" and has had some good and bad , up and down days in the past wk but hard to be more specific, also states overall feels better from last visit in terms of n/v and pain, no fever and Denies urinary symptoms such as dysuria, frequency, urgency, flank pain, hematuria or n/v, fever, chills.  Getting PT at her rehab, plan is for hom about May 21.  Today c/o right shoulder pain with decreased ROM, new for her, not sure when started but may have had her arm "yanked on" when being helped with transfers from chair/bed over the past wk, no neck or radicular pain, pain worse to abduct the shoulder.  Also with new right ankle pain/warmm/swelling in last few days without traums or twisting she can recall, no fever, or prior DJD she know of. Past Medical History  Diagnosis Date  . HYPERLIPIDEMIA 11/18/2006  . HYPERKALEMIA 02/05/2008  . ANEMIA-IRON DEFICIENCY 09/22/2008  . Anemia of other chronic disease 02/05/2008  . ANXIETY 06/03/2008  . DEPRESSION 04/28/2009  . HYPERTENSION 11/18/2006  . PERICARDITIS 01/03/2007  . AORTIC STENOSIS/ INSUFFICIENCY, NON-RHEUMATIC 12/03/2008  . SINUSITIS- ACUTE-NOS 11/20/2007  . SINUSITIS, CHRONIC 02/05/2008  . ALLERGIC RHINITIS 11/18/2006  . PNEUMONIA 05/02/2007  . C O P D 06/27/2008  . Stricture and stenosis of esophagus 11/14/2008  . GERD 01/03/2007  . BARRETTS ESOPHAGUS 11/14/2008  . RENAL INSUFFICIENCY 02/05/2008  . DYSPEPSIA 03/23/2007  . PRURITUS 10/08/2008  . OSTEOARTHRITIS 11/18/2006  . OSTEOARTHRITIS, KNEE, LEFT 01/03/2007  . Cervicalgia 01/13/2009  . BACK PAIN 02/04/2009  . BURSITIS, LEFT HIP 04/18/2009  . OSTEOPOROSIS 11/18/2006  .  INSOMNIA-SLEEP DISORDER-UNSPEC 06/03/2008  . FATIGUE 04/18/2009  . PERIPHERAL EDEMA 02/13/2009  . MURMUR 11/18/2006  . DYSPHAGIA UNSPECIFIED 03/23/2007  . Abdominal pain, generalized 03/01/2007  . Nonspecific (abnormal) findings on radiological and other examination of body structure 06/03/2008  . Tuberculin Test Reaction 11/18/2006  . Acute bronchitis 02/23/2010  . CHOLELITHIASIS 04/24/2010  . NEPHROLITHIASIS, HX OF 04/24/2010  . Polyarthralgia 06/09/2010  . Elevated sed rate 06/11/2010  . Positive ANA (antinuclear antibody) 06/11/2010  . Rheumatoid factor positive 06/11/2010  . Cancer     lung ca  . 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 SPONDYLOSIS, CERVICAL, WITH RADICULOPATHY 01/13/2009  . Cervical radiculopathy 09/28/2010  . Lumbar radiculopathy 09/28/2010   Past Surgical History  Procedure Laterality Date  . Abdominal hysterectomy    . Tubal ligation    . Tumor removal  08/06/08    from lungs  . Anterior cervical decomp/discectomy fusion  01/15/2011    Procedure: ANTERIOR CERVICAL DECOMPRESSION/DISCECTOMY FUSION 2 LEVELS;  Surgeon: Kathaleen Maser Pool;  Location: [facility];  Service: Neurosurgery;  Laterality: N/A;  Cervical Three-Four, Cervical Four-Five Anterior Cervical Decompression Fusion   . Colonscopy    . Nose surgery    . Laparoscopic appendectomy N/A 05/25/2012    Procedure: APPENDECTOMY LAPAROSCOPIC  CONVERTED TO  OPEN APPENDECTOMY, exploratory laparatomy;  Surgeon: Adolph Pollack, MD;  Location: [facility];  Service: General;  Laterality: N/A;  . Bowel resection N/A 05/25/2012    Procedure: SMALL BOWEL RESECTION;  Surgeon:  Adolph Pollack, MD;  Location: [facility];  Service: General;  Laterality: N/A;  . Laparoscopic lysis of adhesions N/A 05/25/2012    Procedure: LAPAROSCOPIC LYSIS OF ADHESIONS;  Surgeon: Adolph Pollack, MD;  Location: [facility];  Service: General;  Laterality: N/A;    reports that she quit smoking about 6 years ago. Her smoking use included Cigarettes. She has a 60  pack-year smoking history. She has quit using smokeless tobacco. She reports that she does not drink alcohol or use illicit drugs. family history includes Cancer in her brother; Coronary artery disease in her other; Heart disease in her father; Hyperlipidemia in her other; and Hypertension in her other.  There is no history of Colon cancer, and Esophageal cancer, and Rectal cancer, and Stomach cancer, . Allergies  Allergen Reactions  . Amoxicillin Itching  . Fentanyl Other (See Comments)    hallucinations  . Hydrocodone Other (See Comments)    ineffective  . Codeine Hives and Rash  . Penicillins Hives and Rash   Current Outpatient Prescriptions on File Prior to Visit  Medication Sig Dispense Refill  . albuterol (PROVENTIL HFA;VENTOLIN HFA) 108 (90 BASE) MCG/ACT inhaler Inhale 1-2 puffs into the lungs 4 (four) times daily as needed. For wheezing or shortness of breath.      Marland Kitchen alendronate (FOSAMAX) 70 MG tablet Take 1 tablet (70 mg total) by mouth every 7 (seven) days. Take with a full glass of water on an empty stomach. Patient takes on Mondays.  4 tablet  11  . ALPRAZolam (XANAX) 0.5 MG tablet Take 0.5 mg by mouth daily as needed for anxiety.      Marland Kitchen amLODipine (NORVASC) 5 MG tablet Take 1 tablet (5 mg total) by mouth daily.  90 tablet  3  . citalopram (CELEXA) 10 MG tablet Take 1 tablet (10 mg total) by mouth daily.  90 tablet  3  . feeding supplement (ENSURE COMPLETE) LIQD Take 237 mLs by mouth daily.      . furosemide (LASIX) 40 MG tablet Take 40 mg by mouth 2 (two) times daily.      Marland Kitchen HYDROcodone-acetaminophen (NORCO) 10-325 MG per tablet TAKE 1 TABLET BY MOUTH EVERY 6 HOURS AS NEEDED FOR PAIN  120 tablet  1  . metoprolol succinate (TOPROL-XL) 25 MG 24 hr tablet Take 1 tablet (25 mg total) by mouth daily.  90 tablet  3  . nitrofurantoin, macrocrystal-monohydrate, (MACROBID) 100 MG capsule Take 1 capsule (100 mg total) by mouth 2 (two) times daily.  14 capsule  0  . ondansetron (ZOFRAN) 4  MG tablet Take 1 tablet (4 mg total) by mouth every 6 (six) hours as needed.  30 tablet  0  . oxyCODONE (OXY IR/ROXICODONE) 5 MG immediate release tablet Take 1 tablet (5 mg total) by mouth every 6 (six) hours as needed.  30 tablet  0  . pantoprazole (PROTONIX) 40 MG tablet TAKE 1 TABLET (40 MG TOTAL) BY MOUTH DAILY.  90 tablet  3  . polyethylene glycol (MIRALAX / GLYCOLAX) packet Take 17 g by mouth daily.      . pravastatin (PRAVACHOL) 40 MG tablet Take 1 tablet (40 mg total) by mouth daily.  90 tablet  3  . senna (SENOKOT) 8.6 MG TABS Take 2 tablets by mouth daily.      Marland Kitchen zolpidem (AMBIEN) 5 MG tablet Take 1 tablet (5 mg total) by mouth at bedtime as needed. For sleep.  30 tablet  0   No current facility-administered medications  on file prior to visit.    Review of Systems  Constitutional: Negative for unexpected weight change, or unusual diaphoresis  HENT: Negative for tinnitus.   Eyes: Negative for photophobia and visual disturbance.  Respiratory: Negative for choking and stridor.   Gastrointestinal: Negative for vomiting and blood in stool.  Genitourinary: Negative for hematuria and decreased urine volume.  Musculoskeletal: Negative for acute joint swelling Skin: Negative for color change and wound.  Neurological: Negative for tremors and numbness other than noted  Psychiatric/Behavioral: Negative for decreased concentration or  hyperactivity.       Objective:   Physical Exam BP 112/70  Pulse 83  Temp(Src) 97.6 F (36.4 C) (Oral)  Wt 158 lb 2 oz (71.725 kg)  BMI 28.91 kg/m2  SpO2 92% VS noted, non toxic, fatigued Constitutional: Pt appears well-developed and well-nourished.  HENT: Head: NCAT.  Right Ear: External ear normal.  Left Ear: External ear normal.  Eyes: Conjunctivae and EOM are normal. Pupils are equal, round, and reactive to light.  Neck: Normal range of motion. Neck supple.  Cardiovascular: Normal rate and regular rhythm.   Pulmonary/Chest: Effort normal and  breath sounds normal.  Abd:  Soft, NT, non-distended, + BS Neurological: Pt is alert. Not confused  Skin: Skin is warm. No erythema.  Right ankle with 1+ effusion, tender bilat LE trace edema noted Right shoulder with mild tender right subachromial, with pain on active abduction to 90 degrees only Psychiatric: Pt behavior is normal. Thought content normal.     Assessment & Plan:

## 2012-06-22 NOTE — Assessment & Plan Note (Signed)
Mild, ? Gout vs djd, doubt septic joint - for empiric predpack asd,  to f/u any worsening symptoms or concerns

## 2012-06-22 NOTE — Assessment & Plan Note (Signed)
Overall stable overall by history and exam, and pt to continue medical treatment as before,  to f/u any worsening symptoms or concerns  

## 2012-06-23 ENCOUNTER — Ambulatory Visit: Payer: Medicare Other

## 2012-06-23 DIAGNOSIS — Z0279 Encounter for issue of other medical certificate: Secondary | ICD-10-CM

## 2012-07-12 ENCOUNTER — Telehealth (INDEPENDENT_AMBULATORY_CARE_PROVIDER_SITE_OTHER): Payer: Self-pay | Admitting: *Deleted

## 2012-07-12 ENCOUNTER — Telehealth (INDEPENDENT_AMBULATORY_CARE_PROVIDER_SITE_OTHER): Payer: Self-pay

## 2012-07-12 NOTE — Telephone Encounter (Signed)
Vernona Rieger with [facility] called with some questions about things she is seeing in the patient's wound.  States patient has recently come home and they have taken over for her dressing changes however they believe they see a loose plastic piece in the wound that they are unsure of and also some mesh.  Vernona Rieger just wanted to be able to discuss with Dr. Abbey Chatters what they are seeing to make sure everything is correct.

## 2012-07-12 NOTE — Telephone Encounter (Signed)
LM for daughter to call regarding patient.  She has been scheduled to see Dr. Abbey Chatters on 07/13/12 to check her abdominal wound.

## 2012-07-13 ENCOUNTER — Encounter (INDEPENDENT_AMBULATORY_CARE_PROVIDER_SITE_OTHER): Payer: Medicare Other | Admitting: General Surgery

## 2012-07-13 NOTE — Telephone Encounter (Signed)
Noted. Will see her today.

## 2012-07-17 ENCOUNTER — Telehealth (INDEPENDENT_AMBULATORY_CARE_PROVIDER_SITE_OTHER): Payer: Self-pay | Admitting: General Surgery

## 2012-07-17 NOTE — Telephone Encounter (Signed)
Pt's daughter returning call from last week from Dr. Maris Berger assistant.  According to the message, mother had appt on Friday for recheck, but that appt was not attended because they did not get the message off the machine until just now.  She has appt on Wednesday, 07/19/12, at 9:00 and will definitely be there for it.

## 2012-07-18 DIAGNOSIS — J449 Chronic obstructive pulmonary disease, unspecified: Secondary | ICD-10-CM

## 2012-07-18 DIAGNOSIS — I509 Heart failure, unspecified: Secondary | ICD-10-CM

## 2012-07-18 DIAGNOSIS — S31109A Unspecified open wound of abdominal wall, unspecified quadrant without penetration into peritoneal cavity, initial encounter: Secondary | ICD-10-CM

## 2012-07-18 DIAGNOSIS — I10 Essential (primary) hypertension: Secondary | ICD-10-CM

## 2012-07-18 DIAGNOSIS — K3580 Unspecified acute appendicitis: Secondary | ICD-10-CM

## 2012-07-19 ENCOUNTER — Encounter (INDEPENDENT_AMBULATORY_CARE_PROVIDER_SITE_OTHER): Payer: Self-pay | Admitting: General Surgery

## 2012-07-19 ENCOUNTER — Ambulatory Visit (INDEPENDENT_AMBULATORY_CARE_PROVIDER_SITE_OTHER): Payer: Medicare Other | Admitting: General Surgery

## 2012-07-19 VITALS — BP 110/52 | HR 72 | Temp 97.6°F | Resp 14 | Ht 62.0 in | Wt 159.2 lb

## 2012-07-19 DIAGNOSIS — T8131XA Disruption of external operation (surgical) wound, not elsewhere classified, initial encounter: Secondary | ICD-10-CM

## 2012-07-19 NOTE — Progress Notes (Signed)
Procedure:  Laparoscopic converted to open appendectomy  Date:  05/25/2012  Pathology:  Acute appendicitis with perforation and necrosis  History:  She is here for another postoperative visit. Her right lower quadrant wound is healing by secondary intention. The home health nurses noted a foreign body in the wound. She states she is eating better, her bowels are moving, she's getting stronger.  Exam: General- Is in NAD. Abdomen-soft, open wound in the right lower quadrant demonstrates a suture consistent with a foreign body which I removed. There is a fascial dehiscence that is stuck for some peritoneum and what appears to be underlying intestine. It does not move with cough.  Assessment:  Open right lower quadrant wound with fascial dehiscence almost 2 months after the surgery. The contents are stuck to the posterior abdominal wall there is no evidence of evisceration. She is on chronic steroids.  Plan:  We talked about negative pressure 1 therapy but they did not think she could handle having the device 24 hours a day. We'll continue light activities and wound care for now. Return visit in one week.

## 2012-07-19 NOTE — Patient Instructions (Addendum)
Do not overeat and gain weight. No lifting over 5 pounds. Continue current dressing changes.

## 2012-07-21 ENCOUNTER — Other Ambulatory Visit: Payer: Self-pay | Admitting: Internal Medicine

## 2012-07-21 NOTE — Telephone Encounter (Signed)
Done hardcopy to robin  

## 2012-07-24 NOTE — Telephone Encounter (Signed)
Faxed hardcopy to [facility] [address] [place] 

## 2012-07-28 ENCOUNTER — Ambulatory Visit (INDEPENDENT_AMBULATORY_CARE_PROVIDER_SITE_OTHER): Payer: Medicare Other | Admitting: General Surgery

## 2012-07-28 ENCOUNTER — Encounter (INDEPENDENT_AMBULATORY_CARE_PROVIDER_SITE_OTHER): Payer: Self-pay

## 2012-07-28 ENCOUNTER — Encounter (INDEPENDENT_AMBULATORY_CARE_PROVIDER_SITE_OTHER): Payer: Self-pay | Admitting: General Surgery

## 2012-07-28 VITALS — BP 136/70 | HR 68 | Temp 97.8°F | Resp 16 | Ht 62.0 in | Wt 157.2 lb

## 2012-07-28 DIAGNOSIS — Z9889 Other specified postprocedural states: Secondary | ICD-10-CM

## 2012-07-28 MED ORDER — HYDROCODONE-ACETAMINOPHEN 5-325 MG PO TABS: 1.0000 | ORAL_TABLET | Freq: Four times a day (QID) | ORAL | Status: DC | PRN

## 2012-07-28 NOTE — Progress Notes (Signed)
Procedure:  Laparoscopic converted to open appendectomy  Date:  05/25/2012  Pathology:  Acute appendicitis with perforation and necrosis  History:  She is here for another postoperative visit. Her right lower quadrant wound is healing by secondary intention. Exam: General- Is in NAD. Abdomen-soft, open wound in the right lower quadrant demonstrates a fixed dehiscence healing in from the periphery  Assessment:  Open right lower quadrant wound with fascial dehiscence almost 2 months after the surgery. The contents are stuck to the posterior abdominal wall there is no evidence of evisceration.  The wound is smaller.  Plan: Continue wound care and light activities. I gave her some Norco for pain instead of oxycodone. Return visit 3 weeks.

## 2012-07-28 NOTE — Patient Instructions (Signed)
Continue light activities. Continue current wound care.

## 2012-08-11 ENCOUNTER — Telehealth (INDEPENDENT_AMBULATORY_CARE_PROVIDER_SITE_OTHER): Payer: Self-pay

## 2012-08-11 NOTE — Telephone Encounter (Signed)
I agree

## 2012-08-11 NOTE — Telephone Encounter (Signed)
[facility] nurse calling for a verbal order to extend nurse visits 1 x wk for 4 additional weeks to supervise wet to dry dressing changes of abdominal wound. Pt's daughter is performing dressing changes. Ok'd  by Cyndra Numbers for Dr. Abbey Chatters.  Pt has f/u appt on 08/21/12.

## 2012-08-21 ENCOUNTER — Encounter (INDEPENDENT_AMBULATORY_CARE_PROVIDER_SITE_OTHER): Payer: Self-pay | Admitting: General Surgery

## 2012-08-21 ENCOUNTER — Ambulatory Visit (INDEPENDENT_AMBULATORY_CARE_PROVIDER_SITE_OTHER): Payer: Medicare Other | Admitting: General Surgery

## 2012-08-21 VITALS — BP 122/66 | HR 80 | Temp 98.6°F | Resp 16 | Ht 62.0 in | Wt 161.8 lb

## 2012-08-21 DIAGNOSIS — Z9889 Other specified postprocedural states: Secondary | ICD-10-CM

## 2012-08-21 MED ORDER — HYDROCODONE-ACETAMINOPHEN 5-325 MG PO TABS: 1.0000 | ORAL_TABLET | Freq: Four times a day (QID) | ORAL | Status: DC | PRN

## 2012-08-21 NOTE — Patient Instructions (Signed)
Continue current wound care

## 2012-08-21 NOTE — Progress Notes (Signed)
Patient ID: [name], female   DOB: 1941-01-06, 72 y.o.   MRN: [id number]  Chief Complaint  Patient presents with  . Routine Post Op    reck abd wound    HPI [name] is a 72 y.o. female.   HPI  She is here for followup of an open abdominal wound in the right lower quadrant. She is eating better. Bowels are moving. She is no longer taking prednisone.  Past Medical History  Diagnosis Date  . HYPERLIPIDEMIA 11/18/2006  . HYPERKALEMIA 02/05/2008  . ANEMIA-IRON DEFICIENCY 09/22/2008  . Anemia of other chronic disease 02/05/2008  . ANXIETY 06/03/2008  . DEPRESSION 04/28/2009  . HYPERTENSION 11/18/2006  . PERICARDITIS 01/03/2007  . AORTIC STENOSIS/ INSUFFICIENCY, NON-RHEUMATIC 12/03/2008  . SINUSITIS- ACUTE-NOS 11/20/2007  . SINUSITIS, CHRONIC 02/05/2008  . ALLERGIC RHINITIS 11/18/2006  . PNEUMONIA 05/02/2007  . C O P D 06/27/2008  . Stricture and stenosis of esophagus 11/14/2008  . GERD 01/03/2007  . BARRETTS ESOPHAGUS 11/14/2008  . RENAL INSUFFICIENCY 02/05/2008  . DYSPEPSIA 03/23/2007  . PRURITUS 10/08/2008  . OSTEOARTHRITIS 11/18/2006  . OSTEOARTHRITIS, KNEE, LEFT 01/03/2007  . Cervicalgia 01/13/2009  . BACK PAIN 02/04/2009  . BURSITIS, LEFT HIP 04/18/2009  . OSTEOPOROSIS 11/18/2006  . INSOMNIA-SLEEP DISORDER-UNSPEC 06/03/2008  . FATIGUE 04/18/2009  . PERIPHERAL EDEMA 02/13/2009  . MURMUR 11/18/2006  . DYSPHAGIA UNSPECIFIED 03/23/2007  . Abdominal pain, generalized 03/01/2007  . Nonspecific (abnormal) findings on radiological and other examination of body structure 06/03/2008  . Tuberculin Test Reaction 11/18/2006  . Acute bronchitis 02/23/2010  . CHOLELITHIASIS 04/24/2010  . NEPHROLITHIASIS, HX OF 04/24/2010  . Polyarthralgia 06/09/2010  . Elevated sed rate 06/11/2010  . Positive ANA (antinuclear antibody) 06/11/2010  . Rheumatoid factor positive 06/11/2010  . Cancer     lung ca  . 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 SPONDYLOSIS, CERVICAL, WITH RADICULOPATHY 01/13/2009  . Cervical  radiculopathy 09/28/2010  . Lumbar radiculopathy 09/28/2010    Past Surgical History  Procedure Laterality Date  . Abdominal hysterectomy    . Tubal ligation    . Tumor removal  08/06/08    from lungs  . Anterior cervical decomp/discectomy fusion  01/15/2011    Procedure: ANTERIOR CERVICAL DECOMPRESSION/DISCECTOMY FUSION 2 LEVELS;  Surgeon: Kathaleen Maser Pool;  Location: [facility];  Service: Neurosurgery;  Laterality: N/A;  Cervical Three-Four, Cervical Four-Five Anterior Cervical Decompression Fusion   . Colonscopy    . Nose surgery    . Laparoscopic appendectomy N/A 05/25/2012    Procedure: APPENDECTOMY LAPAROSCOPIC  CONVERTED TO  OPEN APPENDECTOMY, exploratory laparatomy;  Surgeon: Adolph Pollack, MD;  Location: [facility];  Service: General;  Laterality: N/A;  . Bowel resection N/A 05/25/2012    Procedure: SMALL BOWEL RESECTION;  Surgeon: Adolph Pollack, MD;  Location: [facility];  Service: General;  Laterality: N/A;  . Laparoscopic lysis of adhesions N/A 05/25/2012    Procedure: LAPAROSCOPIC LYSIS OF ADHESIONS;  Surgeon: Adolph Pollack, MD;  Location: [facility];  Service: General;  Laterality: N/A;    Family History  Problem Relation Age of Onset  . Hyperlipidemia Other   . Hypertension Other   . Coronary artery disease Other     several nephrew  . Heart disease Father   . Colon cancer Neg Hx   . Esophageal cancer Neg Hx   . Rectal cancer Neg Hx   . Stomach cancer Neg Hx   . Cancer Brother     ?stomach    Social  History History  Substance Use Topics  . Smoking status: Former Smoker -- 1.50 packs/day for 40 years    Types: Cigarettes    Quit date: 02/15/2006  . Smokeless tobacco: Former Neurosurgeon  . Alcohol Use: No    Allergies  Allergen Reactions  . Amoxicillin Itching  . Fentanyl Other (See Comments)    hallucinations  . Hydrocodone Other (See Comments)    ineffective  . Codeine Hives and Rash  . Penicillins Hives and Rash    Current Outpatient Prescriptions   Medication Sig Dispense Refill  . albuterol (PROVENTIL HFA;VENTOLIN HFA) 108 (90 BASE) MCG/ACT inhaler Inhale 1-2 puffs into the lungs 4 (four) times daily as needed. For wheezing or shortness of breath.      Marland Kitchen alendronate (FOSAMAX) 70 MG tablet Take 1 tablet (70 mg total) by mouth every 7 (seven) days. Take with a full glass of water on an empty stomach. Patient takes on Mondays.  4 tablet  11  . ALPRAZolam (XANAX) 0.5 MG tablet TAKE 1 TABLET EVERY DAY AS NEEDED  30 tablet  5  . amLODipine (NORVASC) 5 MG tablet Take 1 tablet (5 mg total) by mouth daily.  90 tablet  3  . citalopram (CELEXA) 10 MG tablet Take 1 tablet (10 mg total) by mouth daily.  90 tablet  3  . feeding supplement (ENSURE COMPLETE) LIQD Take 237 mLs by mouth daily.      . furosemide (LASIX) 40 MG tablet Take 40 mg by mouth 2 (two) times daily.      Marland Kitchen HYDROcodone-acetaminophen (NORCO) 5-325 MG per tablet Take 1-2 tablets by mouth every 6 (six) hours as needed for pain.  30 tablet  1  . metoprolol succinate (TOPROL-XL) 25 MG 24 hr tablet Take 1 tablet (25 mg total) by mouth daily.  90 tablet  3  . nitrofurantoin, macrocrystal-monohydrate, (MACROBID) 100 MG capsule Take 1 capsule (100 mg total) by mouth 2 (two) times daily.  14 capsule  0  . ondansetron (ZOFRAN) 4 MG tablet Take 1 tablet (4 mg total) by mouth every 6 (six) hours as needed.  30 tablet  0  . pantoprazole (PROTONIX) 40 MG tablet TAKE 1 TABLET (40 MG TOTAL) BY MOUTH DAILY.  90 tablet  3  . polyethylene glycol (MIRALAX / GLYCOLAX) packet Take 17 g by mouth daily.      . pravastatin (PRAVACHOL) 40 MG tablet Take 1 tablet (40 mg total) by mouth daily.  90 tablet  3  . predniSONE (DELTASONE) 10 MG tablet 2 tabs by mouth per day for 7 days  14 tablet  0  . senna (SENOKOT) 8.6 MG TABS Take 2 tablets by mouth daily.      Marland Kitchen zolpidem (AMBIEN) 5 MG tablet Take 1 tablet (5 mg total) by mouth at bedtime as needed. For sleep.  30 tablet  0  . oxyCODONE (OXY IR/ROXICODONE) 5 MG  immediate release tablet        No current facility-administered medications for this visit.    Review of Systems Review of Systems  Gastrointestinal: Negative for abdominal pain and constipation.    Blood pressure 122/66, pulse 80, temperature 98.6 F (37 C), temperature source Temporal, resp. rate 16, height 5\' 2"  (1.575 m), weight 161 lb 12.8 oz (73.392 kg).  Physical Exam Physical Exam  Constitutional: No distress.  Elderly female.  Abdominal: Soft. She exhibits no mass. There is no tenderness.  Partially open right lower quadrant wound. There has been no healing since I  saw her last period fascia is chronically dehisced but there is no evisceration.    Data Reviewed Previous notes.  Assessment    Nonhealing abdominal wound.     Plan    Irrigation and closure of abdominal wound as outpatient procedure. We discussed this procedure, rationale and risks. Risk of development of bleeding, infection, failure of the wound to heal, intestinal leak. She and her family member seemed to understand and agree with the plan.        Oseias Horsey J 08/21/2012, 4:08 PM

## 2012-08-23 ENCOUNTER — Telehealth (INDEPENDENT_AMBULATORY_CARE_PROVIDER_SITE_OTHER): Payer: Self-pay

## 2012-08-23 NOTE — Telephone Encounter (Signed)
Vernona Rieger with [facility] informed of Dr. Romeo Apple  orders

## 2012-08-23 NOTE — Telephone Encounter (Signed)
Continue current changes.  She is having surgery on the wound 7/24.

## 2012-08-23 NOTE — Telephone Encounter (Signed)
[name] with [facility] # [phone] Asking for order for home care [facility] visits X 6wks;Wet to dry dressing change to  silver hydro gel dressing Qd; Patient needs to discuss next procedure to be done she has forgotten what was advised on last visit per Dr. Abbey Chatters

## 2012-08-30 ENCOUNTER — Encounter (HOSPITAL_COMMUNITY): Payer: Self-pay | Admitting: Pharmacy Technician

## 2012-09-01 ENCOUNTER — Encounter (HOSPITAL_COMMUNITY): Payer: Self-pay

## 2012-09-01 ENCOUNTER — Encounter (HOSPITAL_COMMUNITY)
Admission: RE | Admit: 2012-09-01 | Discharge: 2012-09-01 | Disposition: A | Discharge status: Home / Self Care | Payer: Medicare Other | Source: Ambulatory Visit | Attending: General Surgery | Admitting: General Surgery

## 2012-09-01 DIAGNOSIS — X58XXXA Exposure to other specified factors, initial encounter: Secondary | ICD-10-CM | POA: Insufficient documentation

## 2012-09-01 DIAGNOSIS — R040 Epistaxis: Secondary | ICD-10-CM

## 2012-09-01 DIAGNOSIS — Z9289 Personal history of other medical treatment: Secondary | ICD-10-CM

## 2012-09-01 DIAGNOSIS — Z01812 Encounter for preprocedural laboratory examination: Secondary | ICD-10-CM | POA: Insufficient documentation

## 2012-09-01 DIAGNOSIS — T148XXD Other injury of unspecified body region, subsequent encounter: Secondary | ICD-10-CM

## 2012-09-01 DIAGNOSIS — S31109A Unspecified open wound of abdominal wall, unspecified quadrant without penetration into peritoneal cavity, initial encounter: Secondary | ICD-10-CM | POA: Insufficient documentation

## 2012-09-01 HISTORY — DX: Other injury of unspecified body region, subsequent encounter: T14.8XXD

## 2012-09-01 HISTORY — PX: CATARACT EXTRACTION, BILATERAL: SHX1313

## 2012-09-01 HISTORY — DX: Epistaxis: R04.0

## 2012-09-01 HISTORY — DX: Personal history of other medical treatment: Z92.89

## 2012-09-01 LAB — COMPREHENSIVE METABOLIC PANEL
ALT: 6 U/L (ref 0–35)
AST: 8 U/L (ref 0–37)
Albumin: 3.5 g/dL (ref 3.5–5.2)
Alkaline Phosphatase: 56 U/L (ref 39–117)
BUN: 27 mg/dL — ABNORMAL HIGH (ref 6–23)
Chloride: 103 mEq/L (ref 96–112)
Potassium: 4.2 mEq/L (ref 3.5–5.1)
Sodium: 143 mEq/L (ref 135–145)
Total Bilirubin: 0.2 mg/dL — ABNORMAL LOW (ref 0.3–1.2)
Total Protein: 7.9 g/dL (ref 6.0–8.3)

## 2012-09-01 LAB — CBC WITH DIFFERENTIAL/PLATELET
Basophils Absolute: 0 10*3/uL (ref 0.0–0.1)
Basophils Relative: 0 % (ref 0–1)
Eosinophils Absolute: 0.2 10*3/uL (ref 0.0–0.7)
Hemoglobin: 11.2 g/dL — ABNORMAL LOW (ref 12.0–15.0)
MCH: 31.3 pg (ref 26.0–34.0)
MCHC: 31.7 g/dL (ref 30.0–36.0)
Monocytes Relative: 8 % (ref 3–12)
Neutro Abs: 2.8 10*3/uL (ref 1.7–7.7)
Neutrophils Relative %: 58 % (ref 43–77)
Platelets: 230 10*3/uL (ref 150–400)
RDW: 14 % (ref 11.5–15.5)

## 2012-09-01 LAB — PROTIME-INR: Prothrombin Time: 13.4 seconds (ref 11.6–15.2)

## 2012-09-01 NOTE — Pre-Procedure Instructions (Signed)
09-01-12 EKG/ CXR 4'14 -Epic

## 2012-09-01 NOTE — Patient Instructions (Addendum)
20 [name]  09/01/2012   Your procedure is scheduled on:  7-24 -2014  Report to [facility] at     0915   AM   Call this number if you have problems the morning of surgery: [phone]  Or Presurgical Testing [phone]([name])   Remember: Follow any bowel prep instructions per MD office. For Cpap use: Bring mask and tubing only.   Do not eat food:After Midnight.    Take these medicines the morning of surgery with A SIP OF WATER: Amlodipine.Citalopram. Hydrocodone. Alprazolam. Metoprolol. Protonix. Pravachol. Bring Albuterol inhaler/use as needed.   Do not wear jewelry, make-up or nail polish.  Do not wear lotions, powders, or perfumes. You may wear deodorant.  Do not shave 12 hours prior to first CHG shower(legs and under arms).(face and neck okay.)  Do not bring valuables to the hospital.  Contacts, dentures or bridgework,body piercing,  may not be worn into surgery.  Leave suitcase in the car. After surgery it may be brought to your room.  For patients admitted to the hospital, checkout time is 11:00 AM the day of discharge.   Patients discharged the day of surgery will not be allowed to drive home. Must have responsible person with you x 24 hours once discharged.  Name and phone number of your driver: [name], daughter [phone] cell  Special Instructions: CHG(Chlorhedine 4%-"Hibiclens","Betasept","Aplicare") Shower Use Special Wash: see special instructions.(avoid face and genitals)       Failure to follow these instructions may result in Cancellation of your surgery.   Patient signature_______________________________________________________

## 2012-09-07 ENCOUNTER — Encounter (HOSPITAL_COMMUNITY)
Admission: RE | Disposition: A | Discharge status: Home / Self Care | Payer: Self-pay | Source: Ambulatory Visit | Attending: General Surgery

## 2012-09-07 ENCOUNTER — Ambulatory Visit (HOSPITAL_COMMUNITY): Payer: Medicare Other | Admitting: Certified Registered Nurse Anesthetist

## 2012-09-07 ENCOUNTER — Encounter (HOSPITAL_COMMUNITY): Payer: Self-pay | Admitting: *Deleted

## 2012-09-07 ENCOUNTER — Encounter (HOSPITAL_COMMUNITY): Payer: Self-pay | Admitting: Certified Registered Nurse Anesthetist

## 2012-09-07 ENCOUNTER — Ambulatory Visit (HOSPITAL_COMMUNITY): Payer: Medicare Other

## 2012-09-07 ENCOUNTER — Inpatient Hospital Stay (HOSPITAL_COMMUNITY)
Admission: RE | Admit: 2012-09-07 | Discharge: 2012-09-10 | DRG: 919 | Disposition: A | Discharge status: Home / Self Care | Payer: Medicare Other | Source: Ambulatory Visit | Attending: General Surgery | Admitting: General Surgery

## 2012-09-07 DIAGNOSIS — E785 Hyperlipidemia, unspecified: Secondary | ICD-10-CM | POA: Diagnosis present

## 2012-09-07 DIAGNOSIS — Z79899 Other long term (current) drug therapy: Secondary | ICD-10-CM

## 2012-09-07 DIAGNOSIS — F329 Major depressive disorder, single episode, unspecified: Secondary | ICD-10-CM | POA: Diagnosis present

## 2012-09-07 DIAGNOSIS — E875 Hyperkalemia: Secondary | ICD-10-CM | POA: Diagnosis present

## 2012-09-07 DIAGNOSIS — M810 Age-related osteoporosis without current pathological fracture: Secondary | ICD-10-CM | POA: Diagnosis present

## 2012-09-07 DIAGNOSIS — J4489 Other specified chronic obstructive pulmonary disease: Secondary | ICD-10-CM | POA: Diagnosis present

## 2012-09-07 DIAGNOSIS — N189 Chronic kidney disease, unspecified: Secondary | ICD-10-CM | POA: Diagnosis present

## 2012-09-07 DIAGNOSIS — J9819 Other pulmonary collapse: Secondary | ICD-10-CM | POA: Diagnosis present

## 2012-09-07 DIAGNOSIS — Z01812 Encounter for preprocedural laboratory examination: Secondary | ICD-10-CM

## 2012-09-07 DIAGNOSIS — I5031 Acute diastolic (congestive) heart failure: Secondary | ICD-10-CM | POA: Diagnosis not present

## 2012-09-07 DIAGNOSIS — C3492 Malignant neoplasm of unspecified part of left bronchus or lung: Secondary | ICD-10-CM | POA: Diagnosis present

## 2012-09-07 DIAGNOSIS — K2270 Barrett's esophagus without dysplasia: Secondary | ICD-10-CM | POA: Diagnosis present

## 2012-09-07 DIAGNOSIS — I129 Hypertensive chronic kidney disease with stage 1 through stage 4 chronic kidney disease, or unspecified chronic kidney disease: Secondary | ICD-10-CM | POA: Diagnosis present

## 2012-09-07 DIAGNOSIS — K219 Gastro-esophageal reflux disease without esophagitis: Secondary | ICD-10-CM | POA: Diagnosis present

## 2012-09-07 DIAGNOSIS — F3289 Other specified depressive episodes: Secondary | ICD-10-CM | POA: Diagnosis present

## 2012-09-07 DIAGNOSIS — M76899 Other specified enthesopathies of unspecified lower limb, excluding foot: Secondary | ICD-10-CM | POA: Diagnosis present

## 2012-09-07 DIAGNOSIS — Y836 Removal of other organ (partial) (total) as the cause of abnormal reaction of the patient, or of later complication, without mention of misadventure at the time of the procedure: Secondary | ICD-10-CM | POA: Diagnosis present

## 2012-09-07 DIAGNOSIS — Z791 Long term (current) use of non-steroidal anti-inflammatories (NSAID): Secondary | ICD-10-CM

## 2012-09-07 DIAGNOSIS — T8189XA Other complications of procedures, not elsewhere classified, initial encounter: Secondary | ICD-10-CM

## 2012-09-07 DIAGNOSIS — T8131XA Disruption of external operation (surgical) wound, not elsewhere classified, initial encounter: Principal | ICD-10-CM | POA: Diagnosis present

## 2012-09-07 DIAGNOSIS — Z87891 Personal history of nicotine dependence: Secondary | ICD-10-CM

## 2012-09-07 DIAGNOSIS — D509 Iron deficiency anemia, unspecified: Secondary | ICD-10-CM | POA: Diagnosis present

## 2012-09-07 DIAGNOSIS — F411 Generalized anxiety disorder: Secondary | ICD-10-CM | POA: Diagnosis present

## 2012-09-07 DIAGNOSIS — I5033 Acute on chronic diastolic (congestive) heart failure: Secondary | ICD-10-CM | POA: Diagnosis present

## 2012-09-07 DIAGNOSIS — M1710 Unilateral primary osteoarthritis, unspecified knee: Secondary | ICD-10-CM | POA: Diagnosis present

## 2012-09-07 DIAGNOSIS — J449 Chronic obstructive pulmonary disease, unspecified: Secondary | ICD-10-CM | POA: Diagnosis present

## 2012-09-07 DIAGNOSIS — Z85118 Personal history of other malignant neoplasm of bronchus and lung: Secondary | ICD-10-CM

## 2012-09-07 DIAGNOSIS — I509 Heart failure, unspecified: Secondary | ICD-10-CM | POA: Diagnosis not present

## 2012-09-07 DIAGNOSIS — G4700 Insomnia, unspecified: Secondary | ICD-10-CM | POA: Diagnosis present

## 2012-09-07 HISTORY — PX: DEBRIDEMENT OF ABDOMINAL WALL ABSCESS: SHX6396

## 2012-09-07 LAB — CBC
HCT: 35.5 % — ABNORMAL LOW (ref 36.0–46.0)
Hemoglobin: 11.4 g/dL — ABNORMAL LOW (ref 12.0–15.0)
MCH: 31.8 pg (ref 26.0–34.0)
MCHC: 32.1 g/dL (ref 30.0–36.0)
MCV: 98.9 fL (ref 78.0–100.0)

## 2012-09-07 SURGERY — DEBRIDEMENT OF ABDOMINAL WALL ABSCESS
Anesthesia: General | Wound class: Clean Contaminated

## 2012-09-07 MED ORDER — FUROSEMIDE 10 MG/ML IJ SOLN
20.0000 mg | Freq: Once | INTRAMUSCULAR | Status: AC
  Administered 2012-09-07: 20 mg via INTRAVENOUS

## 2012-09-07 MED ORDER — ACETAMINOPHEN 650 MG RE SUPP: 650.0000 mg | RECTAL | Status: DC | PRN

## 2012-09-07 MED ORDER — ACETAMINOPHEN 10 MG/ML IV SOLN
1000.0000 mg | Freq: Once | INTRAVENOUS | Status: DC | PRN
  Filled 2012-09-07: qty 100

## 2012-09-07 MED ORDER — ACETAMINOPHEN 325 MG PO TABS: 650.0000 mg | ORAL_TABLET | ORAL | Status: DC | PRN

## 2012-09-07 MED ORDER — MORPHINE SULFATE 2 MG/ML IJ SOLN: 2.0000 mg | INTRAMUSCULAR | Status: DC | PRN

## 2012-09-07 MED ORDER — LIDOCAINE HCL (CARDIAC) 20 MG/ML IV SOLN
INTRAVENOUS | Status: DC | PRN
  Administered 2012-09-07: 100 mg via INTRAVENOUS

## 2012-09-07 MED ORDER — VANCOMYCIN HCL IN DEXTROSE 1-5 GM/200ML-% IV SOLN
1000.0000 mg | INTRAVENOUS | Status: AC
  Administered 2012-09-07: 1000 mg via INTRAVENOUS

## 2012-09-07 MED ORDER — FUROSEMIDE 10 MG/ML IJ SOLN
INTRAMUSCULAR | Status: AC
  Administered 2012-09-07: 17:00:00
  Filled 2012-09-07: qty 2

## 2012-09-07 MED ORDER — MIDAZOLAM HCL 5 MG/5ML IJ SOLN
INTRAMUSCULAR | Status: DC | PRN
  Administered 2012-09-07: 0.5 mg via INTRAVENOUS

## 2012-09-07 MED ORDER — BUPIVACAINE HCL (PF) 0.5 % IJ SOLN
INTRAMUSCULAR | Status: AC
  Filled 2012-09-07: qty 30

## 2012-09-07 MED ORDER — ALENDRONATE SODIUM 70 MG PO TABS: 70.0000 mg | ORAL_TABLET | ORAL | Status: DC

## 2012-09-07 MED ORDER — ALBUTEROL SULFATE (5 MG/ML) 0.5% IN NEBU: 2.5000 mg | INHALATION_SOLUTION | RESPIRATORY_TRACT | Status: DC | PRN

## 2012-09-07 MED ORDER — PROPOFOL 10 MG/ML IV BOLUS
INTRAVENOUS | Status: DC | PRN
  Administered 2012-09-07: 20 mg via INTRAVENOUS
  Administered 2012-09-07: 120 mg via INTRAVENOUS

## 2012-09-07 MED ORDER — ENSURE COMPLETE PO LIQD
237.0000 mL | ORAL | Status: DC
  Administered 2012-09-07 – 2012-09-09 (×3): 237 mL via ORAL

## 2012-09-07 MED ORDER — SODIUM CHLORIDE 0.9 % IJ SOLN: 3.0000 mL | INTRAMUSCULAR | Status: DC | PRN

## 2012-09-07 MED ORDER — FENTANYL CITRATE 0.05 MG/ML IJ SOLN
INTRAMUSCULAR | Status: DC | PRN
  Administered 2012-09-07: 25 ug via INTRAVENOUS

## 2012-09-07 MED ORDER — HYDROMORPHONE HCL PF 1 MG/ML IJ SOLN: 0.2500 mg | INTRAMUSCULAR | Status: DC | PRN

## 2012-09-07 MED ORDER — ALPRAZOLAM 0.5 MG PO TABS
0.5000 mg | ORAL_TABLET | Freq: Every day | ORAL | Status: DC | PRN
  Administered 2012-09-08: 0.5 mg via ORAL
  Filled 2012-09-07: qty 1

## 2012-09-07 MED ORDER — KETOROLAC TROMETHAMINE 15 MG/ML IJ SOLN
INTRAMUSCULAR | Status: AC
  Administered 2012-09-07: 15 mg
  Filled 2012-09-07: qty 1

## 2012-09-07 MED ORDER — ALBUTEROL SULFATE (5 MG/ML) 0.5% IN NEBU
2.5000 mg | INHALATION_SOLUTION | Freq: Once | RESPIRATORY_TRACT | Status: AC
  Administered 2012-09-07: 2.5 mg via RESPIRATORY_TRACT

## 2012-09-07 MED ORDER — LACTATED RINGERS IV SOLN
INTRAVENOUS | Status: DC
  Administered 2012-09-07: 1000 mL via INTRAVENOUS

## 2012-09-07 MED ORDER — OXYCODONE HCL 5 MG PO TABS: 5.0000 mg | ORAL_TABLET | ORAL | Status: DC | PRN

## 2012-09-07 MED ORDER — AMLODIPINE BESYLATE 5 MG PO TABS
5.0000 mg | ORAL_TABLET | Freq: Every morning | ORAL | Status: DC
  Administered 2012-09-08: 5 mg via ORAL
  Filled 2012-09-07: qty 1

## 2012-09-07 MED ORDER — METOPROLOL SUCCINATE ER 25 MG PO TB24
25.0000 mg | ORAL_TABLET | Freq: Every morning | ORAL | Status: DC
  Administered 2012-09-08 – 2012-09-10 (×3): 25 mg via ORAL
  Filled 2012-09-07 (×3): qty 1

## 2012-09-07 MED ORDER — PANTOPRAZOLE SODIUM 40 MG PO TBEC: 40.0000 mg | DELAYED_RELEASE_TABLET | Freq: Every day | ORAL | Status: DC | PRN

## 2012-09-07 MED ORDER — VANCOMYCIN HCL IN DEXTROSE 1-5 GM/200ML-% IV SOLN
INTRAVENOUS | Status: AC
  Filled 2012-09-07: qty 200

## 2012-09-07 MED ORDER — ONDANSETRON HCL 4 MG/2ML IJ SOLN: 4.0000 mg | Freq: Four times a day (QID) | INTRAMUSCULAR | Status: DC | PRN

## 2012-09-07 MED ORDER — PROMETHAZINE HCL 25 MG/ML IJ SOLN: 6.2500 mg | INTRAMUSCULAR | Status: DC | PRN

## 2012-09-07 MED ORDER — ALBUTEROL SULFATE HFA 108 (90 BASE) MCG/ACT IN AERS
1.0000 | INHALATION_SPRAY | Freq: Four times a day (QID) | RESPIRATORY_TRACT | Status: DC | PRN
  Filled 2012-09-07: qty 6.7

## 2012-09-07 MED ORDER — ENOXAPARIN SODIUM 30 MG/0.3ML ~~LOC~~ SOLN
30.0000 mg | SUBCUTANEOUS | Status: DC
  Administered 2012-09-08 – 2012-09-09 (×2): 30 mg via SUBCUTANEOUS
  Filled 2012-09-07 (×3): qty 0.3

## 2012-09-07 MED ORDER — SODIUM CHLORIDE 0.9 % IV SOLN
INTRAVENOUS | Status: AC
  Administered 2012-09-07: 12:00:00 via INTRAPERITONEAL
  Filled 2012-09-07: qty 6

## 2012-09-07 MED ORDER — ONDANSETRON HCL 4 MG/2ML IJ SOLN
INTRAMUSCULAR | Status: DC | PRN
  Administered 2012-09-07: 4 mg via INTRAVENOUS

## 2012-09-07 MED ORDER — ALBUTEROL SULFATE (5 MG/ML) 0.5% IN NEBU
INHALATION_SOLUTION | RESPIRATORY_TRACT | Status: AC
  Administered 2012-09-07: 17:00:00
  Filled 2012-09-07: qty 0.5

## 2012-09-07 MED ORDER — CITALOPRAM HYDROBROMIDE 10 MG PO TABS
10.0000 mg | ORAL_TABLET | Freq: Every morning | ORAL | Status: DC
  Administered 2012-09-08 – 2012-09-10 (×3): 10 mg via ORAL
  Filled 2012-09-07 (×3): qty 1

## 2012-09-07 MED ORDER — EPHEDRINE SULFATE 50 MG/ML IJ SOLN
INTRAMUSCULAR | Status: DC | PRN
  Administered 2012-09-07: 5 mg via INTRAVENOUS

## 2012-09-07 MED ORDER — BUPIVACAINE HCL (PF) 0.5 % IJ SOLN
INTRAMUSCULAR | Status: DC | PRN
  Administered 2012-09-07: 12 mL

## 2012-09-07 MED ORDER — OXYCODONE HCL 5 MG PO TABS
5.0000 mg | ORAL_TABLET | ORAL | Status: DC | PRN
  Administered 2012-09-08: 10 mg via ORAL
  Filled 2012-09-07: qty 2

## 2012-09-07 MED ORDER — BUPIVACAINE-EPINEPHRINE (PF) 0.5% -1:200000 IJ SOLN
INTRAMUSCULAR | Status: AC
  Filled 2012-09-07: qty 10

## 2012-09-07 MED ORDER — FUROSEMIDE 40 MG PO TABS
40.0000 mg | ORAL_TABLET | Freq: Two times a day (BID) | ORAL | Status: DC
  Administered 2012-09-08: 40 mg via ORAL
  Filled 2012-09-07 (×3): qty 1

## 2012-09-07 SURGICAL SUPPLY — 27 items
BANDAGE GAUZE ELAST BULKY 4 IN (GAUZE/BANDAGES/DRESSINGS) IMPLANT
CANISTER SUCTION 2500CC (MISCELLANEOUS) ×2 IMPLANT
CLOTH BEACON ORANGE TIMEOUT ST (SAFETY) ×2 IMPLANT
COVER SURGICAL LIGHT HANDLE (MISCELLANEOUS) ×2 IMPLANT
DRAPE EXTREMITY T 121X128X90 (DRAPE) IMPLANT
DRAPE LAPAROSCOPIC ABDOMINAL (DRAPES) ×1 IMPLANT
DRAPE LG THREE QUARTER DISP (DRAPES) ×2 IMPLANT
ELECT REM PT RETURN 9FT ADLT (ELECTROSURGICAL) ×2
ELECTRODE REM PT RTRN 9FT ADLT (ELECTROSURGICAL) ×1 IMPLANT
GLOVE BIOGEL PI IND STRL 7.0 (GLOVE) ×1 IMPLANT
GLOVE BIOGEL PI INDICATOR 7.0 (GLOVE) ×1
GLOVE ECLIPSE 8.0 STRL XLNG CF (GLOVE) ×2 IMPLANT
GLOVE INDICATOR 8.0 STRL GRN (GLOVE) ×4 IMPLANT
GOWN STRL NON-REIN LRG LVL3 (GOWN DISPOSABLE) ×2 IMPLANT
GOWN STRL REIN XL XLG (GOWN DISPOSABLE) ×4 IMPLANT
KIT BASIN OR (CUSTOM PROCEDURE TRAY) ×2 IMPLANT
NS IRRIG 1000ML POUR BTL (IV SOLUTION) ×2 IMPLANT
PACK GENERAL/GYN (CUSTOM PROCEDURE TRAY) ×2 IMPLANT
SPONGE GAUZE 4X4 12PLY (GAUZE/BANDAGES/DRESSINGS) ×2 IMPLANT
STOCKINETTE 8 INCH (MISCELLANEOUS) IMPLANT
SUT ETHILON 3 0 PS 1 (SUTURE) ×1 IMPLANT
SUT VIC AB 2-0 SH 18 (SUTURE) ×1 IMPLANT
SWAB COLLECTION DEVICE MRSA (MISCELLANEOUS) IMPLANT
TAPE CLOTH SURG 4X10 WHT LF (GAUZE/BANDAGES/DRESSINGS) ×1 IMPLANT
TOWEL OR 17X26 10 PK STRL BLUE (TOWEL DISPOSABLE) ×2 IMPLANT
TUBE ANAEROBIC SPECIMEN COL (MISCELLANEOUS) IMPLANT
UNDERPAD 30X30 INCONTINENT (UNDERPADS AND DIAPERS) IMPLANT

## 2012-09-07 NOTE — H&P (View-Only) (Signed)
Patient ID: [name], female   DOB: 12/14/1940, 72 y.o.   MRN: [id number]  Chief Complaint  Patient presents with  . Routine Post Op    reck abd wound    HPI [name] is a 72 y.o. female.   HPI  She is here for followup of an open abdominal wound in the right lower quadrant. She is eating better. Bowels are moving. She is no longer taking prednisone.  Past Medical History  Diagnosis Date  . HYPERLIPIDEMIA 11/18/2006  . HYPERKALEMIA 02/05/2008  . ANEMIA-IRON DEFICIENCY 09/22/2008  . Anemia of other chronic disease 02/05/2008  . ANXIETY 06/03/2008  . DEPRESSION 04/28/2009  . HYPERTENSION 11/18/2006  . PERICARDITIS 01/03/2007  . AORTIC STENOSIS/ INSUFFICIENCY, NON-RHEUMATIC 12/03/2008  . SINUSITIS- ACUTE-NOS 11/20/2007  . SINUSITIS, CHRONIC 02/05/2008  . ALLERGIC RHINITIS 11/18/2006  . PNEUMONIA 05/02/2007  . C O P D 06/27/2008  . Stricture and stenosis of esophagus 11/14/2008  . GERD 01/03/2007  . BARRETTS ESOPHAGUS 11/14/2008  . RENAL INSUFFICIENCY 02/05/2008  . DYSPEPSIA 03/23/2007  . PRURITUS 10/08/2008  . OSTEOARTHRITIS 11/18/2006  . OSTEOARTHRITIS, KNEE, LEFT 01/03/2007  . Cervicalgia 01/13/2009  . BACK PAIN 02/04/2009  . BURSITIS, LEFT HIP 04/18/2009  . OSTEOPOROSIS 11/18/2006  . INSOMNIA-SLEEP DISORDER-UNSPEC 06/03/2008  . FATIGUE 04/18/2009  . PERIPHERAL EDEMA 02/13/2009  . MURMUR 11/18/2006  . DYSPHAGIA UNSPECIFIED 03/23/2007  . Abdominal pain, generalized 03/01/2007  . Nonspecific (abnormal) findings on radiological and other examination of body structure 06/03/2008  . Tuberculin Test Reaction 11/18/2006  . Acute bronchitis 02/23/2010  . CHOLELITHIASIS 04/24/2010  . NEPHROLITHIASIS, HX OF 04/24/2010  . Polyarthralgia 06/09/2010  . Elevated sed rate 06/11/2010  . Positive ANA (antinuclear antibody) 06/11/2010  . Rheumatoid factor positive 06/11/2010  . Cancer     lung ca  . 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 SPONDYLOSIS, CERVICAL, WITH RADICULOPATHY 01/13/2009  . Cervical  radiculopathy 09/28/2010  . Lumbar radiculopathy 09/28/2010    Past Surgical History  Procedure Laterality Date  . Abdominal hysterectomy    . Tubal ligation    . Tumor removal  08/06/08    from lungs  . Anterior cervical decomp/discectomy fusion  01/15/2011    Procedure: ANTERIOR CERVICAL DECOMPRESSION/DISCECTOMY FUSION 2 LEVELS;  Surgeon: Henry A Pool;  Location: [facility];  Service: Neurosurgery;  Laterality: N/A;  Cervical Three-Four, Cervical Four-Five Anterior Cervical Decompression Fusion   . Colonscopy    . Nose surgery    . Laparoscopic appendectomy N/A 05/25/2012    Procedure: APPENDECTOMY LAPAROSCOPIC  CONVERTED TO  OPEN APPENDECTOMY, exploratory laparatomy;  Surgeon: Daysia Vandenboom J Angeliz Settlemyre, MD;  Location: [facility];  Service: General;  Laterality: N/A;  . Bowel resection N/A 05/25/2012    Procedure: SMALL BOWEL RESECTION;  Surgeon: Kadie Balestrieri J Haru Anspaugh, MD;  Location: [facility];  Service: General;  Laterality: N/A;  . Laparoscopic lysis of adhesions N/A 05/25/2012    Procedure: LAPAROSCOPIC LYSIS OF ADHESIONS;  Surgeon: Thuan Tippett J Arria Naim, MD;  Location: [facility];  Service: General;  Laterality: N/A;    Family History  Problem Relation Age of Onset  . Hyperlipidemia Other   . Hypertension Other   . Coronary artery disease Other     several nephrew  . Heart disease Father   . Colon cancer Neg Hx   . Esophageal cancer Neg Hx   . Rectal cancer Neg Hx   . Stomach cancer Neg Hx   . Cancer Brother     ?stomach    Social   History History  Substance Use Topics  . Smoking status: Former Smoker -- 1.50 packs/day for 40 years    Types: Cigarettes    Quit date: 02/15/2006  . Smokeless tobacco: Former User  . Alcohol Use: No    Allergies  Allergen Reactions  . Amoxicillin Itching  . Fentanyl Other (See Comments)    hallucinations  . Hydrocodone Other (See Comments)    ineffective  . Codeine Hives and Rash  . Penicillins Hives and Rash    Current Outpatient Prescriptions   Medication Sig Dispense Refill  . albuterol (PROVENTIL HFA;VENTOLIN HFA) 108 (90 BASE) MCG/ACT inhaler Inhale 1-2 puffs into the lungs 4 (four) times daily as needed. For wheezing or shortness of breath.      . alendronate (FOSAMAX) 70 MG tablet Take 1 tablet (70 mg total) by mouth every 7 (seven) days. Take with a full glass of water on an empty stomach. Patient takes on Mondays.  4 tablet  11  . ALPRAZolam (XANAX) 0.5 MG tablet TAKE 1 TABLET EVERY DAY AS NEEDED  30 tablet  5  . amLODipine (NORVASC) 5 MG tablet Take 1 tablet (5 mg total) by mouth daily.  90 tablet  3  . citalopram (CELEXA) 10 MG tablet Take 1 tablet (10 mg total) by mouth daily.  90 tablet  3  . feeding supplement (ENSURE COMPLETE) LIQD Take 237 mLs by mouth daily.      . furosemide (LASIX) 40 MG tablet Take 40 mg by mouth 2 (two) times daily.      . HYDROcodone-acetaminophen (NORCO) 5-325 MG per tablet Take 1-2 tablets by mouth every 6 (six) hours as needed for pain.  30 tablet  1  . metoprolol succinate (TOPROL-XL) 25 MG 24 hr tablet Take 1 tablet (25 mg total) by mouth daily.  90 tablet  3  . nitrofurantoin, macrocrystal-monohydrate, (MACROBID) 100 MG capsule Take 1 capsule (100 mg total) by mouth 2 (two) times daily.  14 capsule  0  . ondansetron (ZOFRAN) 4 MG tablet Take 1 tablet (4 mg total) by mouth every 6 (six) hours as needed.  30 tablet  0  . pantoprazole (PROTONIX) 40 MG tablet TAKE 1 TABLET (40 MG TOTAL) BY MOUTH DAILY.  90 tablet  3  . polyethylene glycol (MIRALAX / GLYCOLAX) packet Take 17 g by mouth daily.      . pravastatin (PRAVACHOL) 40 MG tablet Take 1 tablet (40 mg total) by mouth daily.  90 tablet  3  . predniSONE (DELTASONE) 10 MG tablet 2 tabs by mouth per day for 7 days  14 tablet  0  . senna (SENOKOT) 8.6 MG TABS Take 2 tablets by mouth daily.      . zolpidem (AMBIEN) 5 MG tablet Take 1 tablet (5 mg total) by mouth at bedtime as needed. For sleep.  30 tablet  0  . oxyCODONE (OXY IR/ROXICODONE) 5 MG  immediate release tablet        No current facility-administered medications for this visit.    Review of Systems Review of Systems  Gastrointestinal: Negative for abdominal pain and constipation.    Blood pressure 122/66, pulse 80, temperature 98.6 F (37 C), temperature source Temporal, resp. rate 16, height 5' 2" (1.575 m), weight 161 lb 12.8 oz (73.392 kg).  Physical Exam Physical Exam  Constitutional: No distress.  Elderly female.  Abdominal: Soft. She exhibits no mass. There is no tenderness.  Partially open right lower quadrant wound. There has been no healing since I   saw her last period fascia is chronically dehisced but there is no evisceration.    Data Reviewed Previous notes.  Assessment    Nonhealing abdominal wound.     Plan    Irrigation and closure of abdominal wound as outpatient procedure. We discussed this procedure, rationale and risks. Risk of development of bleeding, infection, failure of the wound to heal, intestinal leak. She and her family member seemed to understand and agree with the plan.        Yarimar Lavis J 08/21/2012, 4:08 PM    

## 2012-09-07 NOTE — Progress Notes (Signed)
Her room air saturations were 91 % when she came to the hospital.  In the recovery room, they drop to 80 %.  She is having no problems with her abdominal incision and is hemodynamically stable.  Her lung demonstrate decreased breath sounds in the bases.  CXR is consistent with CHF.  Will admit to the hospital and request a medical consult.  Will give a dose of IV Lasix now.

## 2012-09-07 NOTE — Anesthesia Postprocedure Evaluation (Signed)
Anesthesia Post Note  Patient: [name]  Procedure(s) Performed: Procedure(s) (LRB):  ABDOMINAL WOUND IRRIGATION AND TWO LAYERED CLOSURE (N/A)  Anesthesia type: General  Patient location: PACU  Post pain: Pain level controlled  Post assessment: Post-op Vital signs reviewed  Last Vitals: BP 132/54  Pulse 58  Temp(Src) 36.4 C (Oral)  Resp 14  SpO2 99%  Post vital signs: Reviewed  Level of consciousness: sedated  Complications: No apparent anesthesia complications

## 2012-09-07 NOTE — Transfer of Care (Signed)
Immediate Anesthesia Transfer of Care Note  Patient: [name]  Procedure(s) Performed: Procedure(s) (LRB):  ABDOMINAL WOUND IRRIGATION AND TWO LAYERED CLOSURE (N/A)  Patient Location: PACU  Anesthesia Type: General  Level of Consciousness: sedated, patient cooperative and responds to stimulaton  Airway & Oxygen Therapy: Patient Spontanous Breathing and Patient connected to face mask oxgen  Post-op Assessment: Report given to PACU RN and Post -op Vital signs reviewed and stable  Post vital signs: Reviewed and stable  Complications: No apparent anesthesia complications

## 2012-09-07 NOTE — Preoperative (Signed)
Beta Blockers   Reason not to administer Beta Blockers:Not Applicable Patient took Beta Blocker 09-07-12 AM

## 2012-09-07 NOTE — Progress Notes (Signed)
Note from 1400 should be rt arm not left arm. Dr. Renold Don in again regarding contiued arm pain. Dr. Abbey Chatters aware. New orders given.

## 2012-09-07 NOTE — Consult Note (Signed)
[facility] Medical Consultation  [name] AVW:[id number] DOB: 08-10-1940 DOA: 09/07/2012 PCP: Jody Barre, MD   Requesting physician: Dr. Abbey [name]  Date of consultation: 09/07/2012 Reason for consultation: Acute hypoxia post op  Impression/Recommendations  Principal Problem:   Acute diastolic congestive heart failure - it appears that sudden oxygen desaturation is related to mild diastolic CHF exacerbation - agree with additional dose of Lasix and monitor clinical response - I think it is reasonable to go ahead and order 2 D ECHO as the last one was done in 01/2012 - will continue home medical regimen with Lasix 40 mg BID, monitor strict I's and O's, daily weights - will also check BNP in AM Active Problems:   Dehiscence of surgical wound-fascial - tolerated surgery well, management per primary team   ANEMIA-IRON DEFICIENCY - Hg and Hct stable and at pt's baseline, CBC in AM    C O P D - appears to be clinically stable from that perspective - no wheezing on exam noted, provide oxygen as needed to keep oxygen saturation > 90% - continue albuterol inhaler as needed per home medical regimen    CKD (chronic kidney disease) - pt on Lasix with baseline Cr 1.4 - 1.6 - renal function stable, will repeat BMP in AM   CARCINOMA, LUNG, 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 Please contact me if I can be of assistance in the meanwhile. Thank you for this consultation.  Chief Complaint: acute hypoxic event post op  HPI:  Pt is 72 y.o. female with multiple and complex medical conditions including COPD (follows with [name] pulmonology), Diastolic CHF grade I based on last 2 D ECHO 01/18/2012,  HTN, HLD, history of obstructive left upper lobe mass that turned out to be squamous cell carcinoma and is now s/p left upper lobe lobectomy in 2010, CKD with baseline Cr 1.4 - 1.6, recent  hospitalization from 05/25/2012 - 06/06/2012 for perforated gangrenous appendicitis and is now s/p lysis of adhesions, appendectomy, small bowel resection done 05/25/2012. She now presented to [facility] for further evaluation of the right lower quadrant wound which apparently was healing by secondary intention but failed to heal completely with no significant progress. She has undergone Irrigation of this non healing RLQ open abdominal wound by Dr. Abbey [name] 09/07/2012. She has tolerated surgery well but has developed post operative acute hypoxic event with sudden desaturation to mid 80's%. Pt denies any shortness of breath at rest currently, no chest pain, no fevers, chills. Pt explains she has chronic non productive cough and requires 2 pillows for sleeping. She has not notice any worsening lower extremity swelling, orthopnea, no specific urinary concerns, no recent changes in dose of Lasix. TRH has been asked to assist with management of hypoxia. Pt was given one dose of Lasix CXR obtained is suggestive of vascular congestion as the primary source.   Review of Systems:  Constitutional: Negative for fever, chills, diaphoresis, activity change, appetite change and fatigue.  HENT: Negative for ear pain, nosebleeds, congestion, facial swelling, rhinorrhea, neck pain, neck stiffness and ear discharge.   Eyes: Negative for pain, discharge, redness, itching and visual disturbance.  Respiratory: Negative for choking, chest tightness, wheezing and stridor.   Cardiovascular: Negative for chest pain, palpitations and leg swelling.  Gastrointestinal: Negative for abdominal distention.  Genitourinary: Negative for dysuria, urgency, frequency, hematuria, flank pain, decreased urine volume, difficulty urinating and dyspareunia.  Musculoskeletal:  Negative for back pain, joint swelling, arthralgias and gait problem.  Neurological: Negative for dizziness, tremors, seizures, syncope, facial asymmetry, speech difficulty,  weakness, light-headedness, numbness and headaches.  Hematological: Negative for adenopathy. Does not bruise/bleed easily.  Psychiatric/Behavioral: Negative for hallucinations, behavioral problems, confusion, dysphoric mood, decreased concentration and agitation.   Past Medical History  Diagnosis Date  . HYPERLIPIDEMIA 11/18/2006  . HYPERKALEMIA 02/05/2008  . ANEMIA-IRON DEFICIENCY 09/22/2008  . Anemia of other chronic disease 02/05/2008  . ANXIETY 06/03/2008  . DEPRESSION 04/28/2009  . HYPERTENSION 11/18/2006  . PERICARDITIS 01/03/2007  . AORTIC STENOSIS/ INSUFFICIENCY, NON-RHEUMATIC 12/03/2008  . SINUSITIS- ACUTE-NOS 11/20/2007  . SINUSITIS, CHRONIC 02/05/2008  . ALLERGIC RHINITIS 11/18/2006  . PNEUMONIA 05/02/2007  . C O P D 06/27/2008  . Stricture and stenosis of esophagus 11/14/2008  . GERD 01/03/2007  . BARRETTS ESOPHAGUS 11/14/2008  . DYSPEPSIA 03/23/2007  . PRURITUS 10/08/2008  . OSTEOARTHRITIS 11/18/2006  . OSTEOARTHRITIS, KNEE, LEFT 01/03/2007  . Cervicalgia 01/13/2009  . BACK PAIN 02/04/2009    is improved  . BURSITIS, LEFT HIP 04/18/2009  . OSTEOPOROSIS 11/18/2006  . INSOMNIA-SLEEP DISORDER-UNSPEC 06/03/2008  . FATIGUE 04/18/2009  . PERIPHERAL EDEMA 02/13/2009  . MURMUR 11/18/2006  . DYSPHAGIA UNSPECIFIED 03/23/2007  . Abdominal pain, generalized 03/01/2007  . Nonspecific (abnormal) findings on radiological and other examination of body structure 06/03/2008  . Tuberculin Test Reaction 11/18/2006  . Acute bronchitis 02/23/2010  . CHOLELITHIASIS 04/24/2010  . NEPHROLITHIASIS, HX OF 04/24/2010  . Polyarthralgia 06/09/2010  . Elevated sed rate 06/11/2010  . Positive ANA (antinuclear antibody) 06/11/2010  . Rheumatoid factor positive 06/11/2010  . Cancer     lung ca  . 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 SPONDYLOSIS, CERVICAL, WITH RADICULOPATHY 01/13/2009  . Cervical radiculopathy 09/28/2010  . Lumbar radiculopathy 09/28/2010  . RENAL INSUFFICIENCY 02/05/2008    function has improved.   . Transfusion history 09-01-12    4'14 tranfused x 2 units  . Bleeding nose 09-01-12    past hx. none recent after having surgery  . Wound healing, delayed 09-01-12    right abdominal area   Past Surgical History  Procedure Laterality Date  . Abdominal hysterectomy    . Tubal ligation    . Tumor removal  08/06/08    from lungs  . Anterior cervical decomp/discectomy fusion  01/15/2011    Procedure: ANTERIOR CERVICAL DECOMPRESSION/DISCECTOMY FUSION 2 LEVELS;  Surgeon: Kathaleen Maser Pool;  Location: [facility];  Service: Neurosurgery;  Laterality: N/A;  Cervical Three-Four, Cervical Four-Five Anterior Cervical Decompression Fusion   . Colonscopy    . Nose surgery    . Laparoscopic appendectomy N/A 05/25/2012    Procedure: APPENDECTOMY LAPAROSCOPIC  CONVERTED TO  OPEN APPENDECTOMY, exploratory laparatomy;  Surgeon: Adolph Pollack, MD;  Location: [facility];  Service: General;  Laterality: N/A;  . Bowel resection N/A 05/25/2012    Procedure: SMALL BOWEL RESECTION;  Surgeon: Adolph Pollack, MD;  Location: [facility];  Service: General;  Laterality: N/A;  . Laparoscopic lysis of adhesions N/A 05/25/2012    Procedure: LAPAROSCOPIC LYSIS OF ADHESIONS;  Surgeon: Adolph Pollack, MD;  Location: [facility];  Service: General;  Laterality: N/A;  . Appendectomy    . Cataract extraction, bilateral  09-01-12   Social History:  reports that she quit smoking about 6 years ago. Her smoking use included Cigarettes. She has a 60 pack-year smoking history. She has quit using smokeless tobacco. She reports that she does not drink alcohol or use illicit drugs.  Allergies  Allergen Reactions  . Amoxicillin Itching  . Fentanyl Other (See Comments)    hallucinations  . Hydrocodone Other (See Comments)    ineffective  . Codeine Hives and Rash  . Penicillins Hives and Rash   Family History  Problem Relation Age of Onset  . Hyperlipidemia Other   . Hypertension Other   . Coronary artery disease Other     several  nephrew  . Heart disease Father   . Colon cancer Neg Hx   . Esophageal cancer Neg Hx   . Rectal cancer Neg Hx   . Stomach cancer Neg Hx   . Cancer Brother     ?stomach    Medication Sig  albuterol MCG/ACT inhaler Inhale 1-2 puffs into lungs 4 (four) times daily as needed for wheezing  ALPRAZolam (XANAX) 0.5 MG tablet Take 0.5 mg by mouth daily as needed for anxiety.  amLODipine (NORVASC) 5 MG tablet Take 5 mg by mouth every morning.  citalopram (CELEXA) 10 MG tablet Take 10 mg by mouth every morning.  furosemide (LASIX) 40 MG tablet Take 40 mg by mouth 2 (two) times daily.  metoprolol succinate  25 MG 24 hr tablet Take 25 mg by mouth every morning.  pantoprazole (PROTONIX) 40 MG tablet Take 40 mg by mouth daily as needed (for heartburn).  pravastatin (PRAVACHOL) 40 MG tablet Take 1 tablet (40 mg total) by mouth daily.  zolpidem (AMBIEN) 5 MG tablet Take 1 tablet (5 mg total) by mouth at bedtime as needed. For sleep.  alendronate (FOSAMAX) 70 MG tablet Take 1 tab every 7 (seven) days.  oxyCODONE 5 MG immediate release  Take 1-2 tablets every 4 (four) hours as needed for pain.   Physical Exam: Blood pressure 126/68, pulse 60, temperature 97.9 F (36.6 C), temperature source Oral, resp. rate 16, SpO2 100.00%. Filed Vitals:   09/07/12 1600 09/07/12 1615 09/07/12 1630 09/07/12 1700  BP: 115/46 123/50 122/47 126/68  Pulse: 57 56 57 60  Temp:  97.7 F (36.5 C)  97.9 F (36.6 C)  TempSrc:      Resp: 16 16 12 16   SpO2: 91% 94% 93% 100%    Physical Exam  Constitutional: Appears well-developed and well-nourished. No distress.  HENT: Normocephalic. External right and left ear normal. Oropharynx is clear and moist.  Eyes: Conjunctivae and EOM are normal. PERRLA, no scleral icterus.  Neck: Normal ROM. Neck supple. No JVD. No tracheal deviation. No thyromegaly.  CVS: RRR, S1/S2 +, no murmurs, no gallops, no carotid bruit.  Pulmonary: Effort and breath sounds normal, bibasilar crackles  and diminished air movement at bases  Abdominal: Soft. Hypoactive bowel sounds, tender in the epigastric area with minimal palpation  Musculoskeletal: Normal range of motion. Trace bilateral lower extremity edema Lymphadenopathy: No lymphadenopathy noted, cervical, inguinal. Neuro: Alert. Normal reflexes, muscle tone coordination. No cranial nerve deficit. Skin: Skin is warm and dry. No rash noted. Not diaphoretic. No erythema. No pallor.  Psychiatric: Normal mood and affect. Behavior, judgment, thought content normal.   Labs on Admission:  Basic Metabolic Panel:  Recent Labs Lab 09/01/12 1115  NA 143  K 4.2  CL 103  CO2 30  GLUCOSE 95  BUN 27*  CREATININE 1.50*  CALCIUM 9.5   Liver Function Tests:  Recent Labs Lab 09/01/12 1115  AST 8  ALT 6  ALKPHOS 56  BILITOT 0.2*  PROT 7.9  ALBUMIN 3.5   CBC:  Recent Labs Lab 09/01/12 1115  WBC 4.7  NEUTROABS 2.8  HGB 11.2*  HCT 35.3*  MCV 98.6  PLT 230   Radiological Exams on Admission: X-ray Chest Pa Or Ap 09/07/2012   Suspected mild pulmonary edema with possible trace effusions and worsening bibasilar atelectasis.     EKG: no 12 lead EKG available   Time spent: 45 minutes   Debbora Presto [facility] Pager [phone]  If 7PM-7AM, please contact night-coverage [email or web address] Password [facility] 09/07/2012, 6:06 PM

## 2012-09-07 NOTE — Addendum Note (Signed)
Addendum created 09/07/12 1434 by Mechele Dawley, CRNA   Modules edited: Anesthesia Flowsheet

## 2012-09-07 NOTE — Op Note (Signed)
Operative Note  [name] female 72 y.o. 09/07/2012  PREOPERATIVE DX:  Chronic, nonhealing right lower quadrant abdominal wound  POSTOPERATIVE DX:  Same  PROCEDURE:  Irrigation of nonhealing right lower quadrant open abdominal wound and 2 layer closure          Surgeon: Adolph Pollack   Assistants: None  Anesthesia: General LMA anesthesia  Indications: This is a 72 year old female who underwent a laparoscopic converted to open appendectomy for perforated appendicitis 05/25/2012. Right lower quadrant wound was healing in by secondary intention. The wound has failed to heal completely and the progress has become static. There is no evidence of wound infection. There does appear to be some weakness and fascial dehiscence although no evisceration. She is now brought to the operating room for the above procedure.    Procedure Detail:  She was seen in the holding area. She was brought to the operating room, placed upon on the operating table, and the anesthetic was administered. The right lower quadrant area and wound were then sterilely prepped and draped.  Using clindamycin/gentamicin irrigation, the wound was copiously irrigated with 1 L of this fluid.  Following that, the subcutaneous tissue was closed with interrupted 2-0 Vicryl sutures. The skin was then closed with a running locking 3-0 nylon suture. The skin and dermis were then injected with Marcaine for local anesthetic effect. Was covered with a dry dressing.  She tolerated the procedure well without any apparent complications and was taken to the recovery room in satisfactory condition.  Estimated Blood Loss:  less than 100 mL         Drains: none  Blood Given: none          Specimens: none        Complications:  * No complications entered in OR log *         Disposition: PACU - hemodynamically stable.         Condition: stable

## 2012-09-07 NOTE — Anesthesia Preprocedure Evaluation (Addendum)
Anesthesia Evaluation  Patient identified by MRN, date of birth, ID band Patient awake    Reviewed: Allergy & Precautions, H&P , NPO status , Patient's Chart, lab work & pertinent test results  Airway Mallampati: II TM Distance: >3 FB Neck ROM: Full    Dental  (+) Dental Advisory Given, Edentulous Upper and Edentulous Lower   Pulmonary pneumonia -, resolved, COPDformer smoker,  breath sounds clear to auscultation        Cardiovascular Exercise Tolerance: Poor hypertension, Pt. on medications +CHF + dysrhythmias Ventricular Tachycardia + Valvular Problems/Murmurs AS, AI and MR Rhythm:Regular Rate:Normal  Cleared by Dr. Daleen Squibb and Dr. Vassie Loll  Echo 01/2012 - Left ventricle: The cavity size was normal. Wall thickness   was increased in a pattern of mild LVH. Systolic function   was normal. The estimated ejection fraction was in the   range of 55% to 65%. Wall motion was normal; there were no   regional wall motion abnormalities. Doppler parameters are   consistent with abnormal left ventricular relaxation   (grade 1 diastolic dysfunction). - Aortic valve: AV trileaflet. Mildly calcified Despite   small systolic gradient appears to open widely Mild   regurgitation. - Mitral valve: Mild regurgitation. - Left atrium: The atrium was moderately dilated. - Right atrium: The atrium was mildly dilated. - Atrial septum: No defect or patent foramen ovale was   identified. - Pulmonary arteries: PA peak pressure: 43mm Hg (S).    Neuro/Psych PSYCHIATRIC DISORDERS Anxiety Depression  Neuromuscular disease    GI/Hepatic Neg liver ROS, GERD-  Medicated,  Endo/Other  negative endocrine ROS  Renal/GU Renal InsufficiencyRenal diseaseChronic kidney disease.     Musculoskeletal negative musculoskeletal ROS (+)   Abdominal (+) + obese,   Peds  Hematology negative hematology ROS (+)   Anesthesia Other Findings    Reproductive/Obstetrics negative OB ROS                       Anesthesia Physical  Anesthesia Plan  ASA: III and emergent  Anesthesia Plan: General and MAC   Post-op Pain Management:    Induction: Intravenous  Airway Management Planned: LMA  Additional Equipment:   Intra-op Plan:   Post-operative Plan: Extubation in OR  Informed Consent: I have reviewed the patients History and Physical, chart, labs and discussed the procedure including the risks, benefits and alternatives for the proposed anesthesia with the patient or authorized representative who has indicated his/her understanding and acceptance.   Dental advisory given  Plan Discussed with: CRNA  Anesthesia Plan Comments:       Anesthesia Quick Evaluation                                  Anesthesia Evaluation  Patient identified by MRN, date of birth, ID band Patient awake    Reviewed: Allergy & Precautions, H&P , NPO status , Patient's Chart, lab work & pertinent test results  Airway       Dental   Pulmonary pneumonia -, COPD         Cardiovascular Exercise Tolerance: Poor hypertension, Pt. on medications + dysrhythmias + Valvular Problems/Murmurs AS and AI  Cleared by Dr. Daleen Squibb and Dr. Vassie Loll   Neuro/Psych PSYCHIATRIC DISORDERS Anxiety Depression  Neuromuscular disease    GI/Hepatic Neg liver ROS, GERD-  ,  Endo/Other  negative endocrine ROS  Renal/GU Renal InsufficiencyRenal diseaseChronic kidney disease.  negative genitourinary   Musculoskeletal  negative musculoskeletal ROS (+)   Abdominal (+) + obese,   Peds negative pediatric ROS (+)  Hematology negative hematology ROS (+)   Anesthesia Other Findings   Reproductive/Obstetrics negative OB ROS                         Anesthesia Physical Anesthesia Plan  ASA: III and emergent  Anesthesia Plan: General   Post-op Pain Management:    Induction: Intravenous  Airway Management  Planned: Oral ETT  Additional Equipment:   Intra-op Plan:   Post-operative Plan: Extubation in OR  Informed Consent: I have reviewed the patients History and Physical, chart, labs and discussed the procedure including the risks, benefits and alternatives for the proposed anesthesia with the patient or authorized representative who has indicated his/her understanding and acceptance.   Dental advisory given  Plan Discussed with: CRNA  Anesthesia Plan Comments: (Seen and cleared by cardiology and pulmonology. Plan is for transfer to stepdown unit postop.)       Anesthesia Quick Evaluation

## 2012-09-07 NOTE — Progress Notes (Signed)
Placed on bedpan to void QS clear yellow urine. Rt arm pain "better'.  Reluctant to move arm at shoulder. Unable to lift arm at shoulder without pain. Bends elbow without difficulty or pain.

## 2012-09-07 NOTE — Progress Notes (Signed)
C/O rt upper arm pain. BP cuff moved to lt arm. Good radial pulse and brisk cap refill. Bilateral equal grips; weak. Speech clear and oriented. Dr. Renold Don called to see. No new orders. Will monitor.

## 2012-09-07 NOTE — Interval H&P Note (Signed)
History and Physical Interval Note:  09/07/2012 11:18 AM  [name]  has presented today for surgery, with the diagnosis of non-healing abdominal wound  The various methods of treatment have been discussed with the patient and family. After consideration of risks, benefits and other options for treatment, the patient has consented to  Procedure(s):  ABDOMINAL WOUND CLEANING AND CLOSURE (N/A) as a surgical intervention .  The patient's history has been reviewed, patient examined, no change in status, stable for surgery.  I have reviewed the patient's chart and labs.  Questions were answered to the patient's satisfaction.     [name] Shela Commons

## 2012-09-07 NOTE — Interval H&P Note (Signed)
History and Physical Interval Note:  09/07/2012 11:25 AM  [name]  has presented today for surgery, with the diagnosis of non-healing abdominal wound  The various methods of treatment have been discussed with the patient and family. After consideration of risks, benefits and other options for treatment, the patient has consented to  Procedure(s):  ABDOMINAL WOUND CLEANING AND CLOSURE (N/A) as a surgical intervention .  The patient's history has been reviewed, patient examined, no change in status, stable for surgery.  I have reviewed the patient's chart and labs.  Questions were answered to the patient's satisfaction.     [name] Shela Commons

## 2012-09-08 ENCOUNTER — Encounter (HOSPITAL_COMMUNITY): Payer: Self-pay | Admitting: General Surgery

## 2012-09-08 DIAGNOSIS — K2270 Barrett's esophagus without dysplasia: Secondary | ICD-10-CM

## 2012-09-08 DIAGNOSIS — I359 Nonrheumatic aortic valve disorder, unspecified: Secondary | ICD-10-CM

## 2012-09-08 DIAGNOSIS — J449 Chronic obstructive pulmonary disease, unspecified: Secondary | ICD-10-CM

## 2012-09-08 LAB — URINALYSIS, ROUTINE W REFLEX MICROSCOPIC
Glucose, UA: NEGATIVE mg/dL
Hgb urine dipstick: NEGATIVE
Ketones, ur: NEGATIVE mg/dL
Protein, ur: NEGATIVE mg/dL
Urobilinogen, UA: 0.2 mg/dL (ref 0.0–1.0)

## 2012-09-08 LAB — URINE MICROSCOPIC-ADD ON

## 2012-09-08 LAB — BASIC METABOLIC PANEL
BUN: 31 mg/dL — ABNORMAL HIGH (ref 6–23)
Chloride: 97 mEq/L (ref 96–112)
Creatinine, Ser: 2.06 mg/dL — ABNORMAL HIGH (ref 0.50–1.10)
Glucose, Bld: 97 mg/dL (ref 70–99)
Potassium: 5 mEq/L (ref 3.5–5.1)

## 2012-09-08 LAB — CBC
HCT: 32.5 % — ABNORMAL LOW (ref 36.0–46.0)
Hemoglobin: 10.4 g/dL — ABNORMAL LOW (ref 12.0–15.0)
MCV: 98.8 fL (ref 78.0–100.0)
WBC: 6.7 10*3/uL (ref 4.0–10.5)

## 2012-09-08 MED ORDER — FUROSEMIDE 40 MG PO TABS
40.0000 mg | ORAL_TABLET | Freq: Every day | ORAL | Status: DC
  Administered 2012-09-09 – 2012-09-10 (×2): 40 mg via ORAL
  Filled 2012-09-08 (×2): qty 1

## 2012-09-08 NOTE — Progress Notes (Signed)
1 Day Post-Op  Subjective: No pain or dyspnea.  Objective: Vital signs in last 24 hours: Temp:  [97.5 F (36.4 C)-98.7 F (37.1 C)] 98.7 F (37.1 C) (07/25 0551) Pulse Rate:  [54-63] 61 (07/25 0225) Resp:  [8-20] 18 (07/25 0551) BP: (108-132)/(41-77) 108/62 mmHg (07/25 0551) SpO2:  [88 %-100 %] 96 % (07/25 0551) Weight:  [163 lb (73.936 kg)-166 lb 3.6 oz (75.4 kg)] 166 lb 3.6 oz (75.4 kg) (07/25 0551) Last BM Date: 09/06/12  Intake/Output from previous day: 07/24 0701 - 07/25 0700 In: 1040 [P.O.:440; I.V.:600] Out: 675 [Urine:675] Intake/Output this shift: Total I/O In: -  Out: 350 [Urine:350]  PE: General- In NAD Abdomen- soft, dressing dry  Lab Results:   Recent Labs  09/07/12 1842 09/08/12 0504  WBC 5.5 6.7  HGB 11.4* 10.4*  HCT 35.5* 32.5*  PLT 195 199   BMET  Recent Labs  09/07/12 1842 09/08/12 0504  NA  --  138  K  --  5.0  CL  --  97  CO2  --  32  GLUCOSE  --  97  BUN  --  31*  CREATININE 1.96* 2.06*  CALCIUM  --  9.3   PT/INR No results found for this basename: LABPROT, INR,  in the last 72 hours Comprehensive Metabolic Panel:    Component Value Date/Time   NA 138 09/08/2012 0504   NA 147* 09/20/2011 0817   K 5.0 09/08/2012 0504   K 4.2 09/20/2011 0817   CL 97 09/08/2012 0504   CL 100 09/20/2011 0817   CO2 32 09/08/2012 0504   CO2 29 09/20/2011 0817   BUN 31* 09/08/2012 0504   BUN 20 09/20/2011 0817   CREATININE 2.06* 09/08/2012 0504   CREATININE 1.6* 09/20/2011 0817   GLUCOSE 97 09/08/2012 0504   GLUCOSE 88 09/20/2011 0817   CALCIUM 9.3 09/08/2012 0504   CALCIUM 9.1 09/20/2011 0817   AST 8 09/01/2012 1115   AST 17 09/20/2011 0817   ALT 6 09/01/2012 1115   ALT 23 09/20/2011 0817   ALKPHOS 56 09/01/2012 1115   ALKPHOS 69 09/20/2011 0817   BILITOT 0.2* 09/01/2012 1115   BILITOT 0.60 09/20/2011 0817   PROT 7.9 09/01/2012 1115   PROT 7.4 09/20/2011 0817   ALBUMIN 3.5 09/01/2012 1115     Studies/Results: X-ray Chest Pa Or Ap  09/07/2012   *RADIOLOGY REPORT*   Clinical Data: Postop (abdominal wound clean and closure)  CHEST - 1 VIEW  Comparison: 06/13/2012; 06/05/2012  Findings:  Grossly unchanged enlarged cardiac silhouette and mediastinal contours with atherosclerotic calcifications within the thoracic aorta.  The pulmonary vasculature is less distinct with cephalization of flow.  Trace bilateral effusions are not excluded. Worsening bibasilar opacities.  No definite pneumothorax.  Stable postsurgical change of the posterior aspect of the left fifth rib. Post lower cervical ACDF, incompletely evaluated.  IMPRESSION: Suspected mild pulmonary edema with possible trace effusions and worsening bibasilar atelectasis.   Original Report Authenticated By: Tacey Ruiz, MD    Anti-infectives: Anti-infectives   Start     Dose/Rate Route Frequency Ordered Stop   09/07/12 1045  clindamycin (CLEOCIN) 900 mg, gentamicin (GARAMYCIN) 240 mg in sodium chloride 0.9 % 1,000 mL for intraperitoneal lavage      Intraperitoneal To Surgery 09/07/12 1036 09/07/12 1205   09/07/12 0916  vancomycin (VANCOCIN) IVPB 1000 mg/200 mL premix     1,000 mg 200 mL/hr over 60 Minutes Intravenous On call to O.R. 09/07/12 4098 09/07/12 1120  Assessment Principal Problem:   Acute diastolic congestive heart failure-diuresis started, ECHO ordered. Active Problems:   ANEMIA-IRON DEFICIENCY   CKD (chronic kidney disease)-creatinine up after diuresis   Nonhealing RLQ abdominal wound s/p irrigation and closure-doing well from this standpoint    LOS: 1 day   Plan: CHF and CKD treatment per Medicine.  Home when medically stable.   [name] 09/08/2012

## 2012-09-08 NOTE — Progress Notes (Signed)
[facility] PROGRESS NOTE  [name]:[id number] DOB: 22-Feb-1940 DOA: 09/07/2012 PCP: [name], MD  Assessment/Plan:  Acute diastolic congestive heart failure  -Chest X ray with pulmonary edema, atelectasis.  -Patient with improvement of dyspnea. Received 20 mg IV lasix 7-24.  -Cr increase to 2. Will change lasix to 40 mg daily. Repeat renal function in am. -ECHO results pending.  -Check oxygen on ambulation.  -BNP ordered.   CKD (chronic kidney disease)  - baseline Cr 1.4 - 1.6  - Increase in cr. Will change lasix to 40 mg daily.  -Avoid nephrotoxins and hypotension.  -Will hold Norvasc to avoid hypotension.  -Strict I and O.  -Check UA.   ANEMIA-IRON DEFICIENCY  - Hg and Hct stable.  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  Family Communication: Care discussed with patient.  Disposition Plan: PT consulted.      Procedures: Irrigation of nonhealing right lower quadrant open abdominal wound and 2 layer closure 7-24   Antibiotics: Received clindamycin.   HPI/Subjective: Patient denies SOB, chest pain. No diarrhea or vomiting.   Objective: Filed Vitals:   09/07/12 2120 09/07/12 2330 09/08/12 0225 09/08/12 0551  BP: 110/68  109/61 108/62  Pulse: 59  61   Temp: 97.9 F (36.6 C)  98.2 F (36.8 C) 98.7 F (37.1 C)  TempSrc: Oral  Oral Oral  Resp: 18  20 18   Height:      Weight:    75.4 kg (166 lb 3.6 oz)  SpO2: 96% 99% 100% 96%    Intake/Output Summary (Last 24 hours) at 09/08/12 1236 Last data filed at 09/08/12 1222  Gross per 24 hour  Intake   1140 ml  Output   1275 ml  Net   -135 ml   Filed Weights   09/07/12 1741 09/08/12 0551  Weight: 73.936 kg (163 lb) 75.4 kg (166 lb 3.6 oz)     Exam:   General:  No distress.   Cardiovascular: S 1, S 2 RRR  Respiratory: Crackles right side.   Abdomen: BS present, soft, NT, incision site with dressing.   Musculoskeletal: trace edema.   Data Reviewed: Basic Metabolic Panel:  Recent Labs Lab 09/07/12 1842 09/08/12 0504  NA  --  138  K  --  5.0  CL  --  97  CO2  --  32  GLUCOSE  --  97  BUN  --  31*  CREATININE 1.96* 2.06*  CALCIUM  --  9.3   Liver Function Tests: No results found for this basename: AST, ALT, ALKPHOS, BILITOT, PROT, ALBUMIN,  in the last 168 hours No results found for this basename: LIPASE, AMYLASE,  in the last 168 hours No results found for this basename: AMMONIA,  in the last 168 hours CBC:  Recent Labs Lab 09/07/12 1842 09/08/12 0504  WBC 5.5 6.7  HGB 11.4* 10.4*  HCT 35.5* 32.5*  MCV 98.9 98.8  PLT 195 199   Cardiac Enzymes: No results found for this basename: CKTOTAL, CKMB, CKMBINDEX, TROPONINI,  in the last 168 hours BNP (last 3 results)  Recent Labs  06/02/12 0435 06/05/12 1205  PROBNP 3699.0* 1043.0*   CBG: No results found for this basename: GLUCAP,  in  the last 168 hours  No results found for this or any previous visit (from the past 240 hour(s)).   Studies: X-ray Chest Pa Or Ap  09/07/2012   *RADIOLOGY REPORT*  Clinical Data: Postop (abdominal wound clean and closure)  CHEST - 1 VIEW  Comparison: 06/13/2012; 06/05/2012  Findings:  Grossly unchanged enlarged cardiac silhouette and mediastinal contours with atherosclerotic calcifications within the thoracic aorta.  The pulmonary vasculature is less distinct with cephalization of flow.  Trace bilateral effusions are not excluded. Worsening bibasilar opacities.  No definite pneumothorax.  Stable postsurgical change of the posterior aspect of the left fifth rib. Post lower cervical ACDF, incompletely evaluated.  IMPRESSION: Suspected mild pulmonary edema with possible trace effusions and worsening bibasilar atelectasis.    Original Report Authenticated By: Tacey Ruiz, MD    Scheduled Meds: . citalopram  10 mg Oral q morning - 10a  . enoxaparin (LOVENOX) injection  30 mg Subcutaneous Q24H  . feeding supplement  237 mL Oral Q24H  . [START ON 09/09/2012] furosemide  40 mg Oral Daily  . metoprolol succinate  25 mg Oral q morning - 10a   Continuous Infusions:   Principal Problem:   Acute diastolic congestive heart failure Active Problems:   CARCINOMA, LUNG, SQUAMOUS CELL   ANEMIA-IRON DEFICIENCY   C O P D   BARRETTS ESOPHAGUS   CKD (chronic kidney disease)   Dehiscence of surgical wound-fascial    Time spent: 35 minutes.     [name],[name]  [facility] Pager [phone]. If 7PM-7AM, please contact night-coverage at [email or web address], password [facility] 09/08/2012, 12:36 PM  LOS: 1 day

## 2012-09-08 NOTE — Evaluation (Signed)
Physical Therapy Evaluation Patient Details Name: [name] MRN: [id number] DOB: 1941-02-13 Today's Date: 09/08/2012 Time: 4098-1191 PT Time Calculation (min): 31 min  PT Assessment / Plan / Recommendation History of Present Illness     Clinical Impression  Pt s/p irrigation and closure of abdominal wound presents with functional mobility limitations 2* post op abdominal pain and ambulatory instability.  Pt also noted to desat on RA with activity.  Pt will benefit from acute stay intervention and HHPT follow up at point of d/c.    PT Assessment  Patient needs continued PT services    Follow Up Recommendations  Home health PT;Supervision/Assistance - 24 hour    Does the patient have the potential to tolerate intense rehabilitation      Barriers to Discharge        Equipment Recommendations  None recommended by PT    Recommendations for Other Services OT consult   Frequency Min 3X/week    Precautions / Restrictions Precautions Precautions: Fall Restrictions Weight Bearing Restrictions: No   Pertinent Vitals/Pain SATURATION QUALIFICATIONS: (This note is used to comply with regulatory documentation for home oxygen)  Patient Saturations on Room Air at Rest = 93%  Patient Saturations on Room Air while Ambulating = 81%  Patient Saturations on 2 Liters of oxygen while Ambulating = 96%  Patient Saturations on 1 Liter of oxygen while Ambulating = 90%      Mobility  Transfers Transfers: Sit to Stand;Stand to Sit Sit to Stand: 4: Min assist Stand to Sit: 4: Min guard Details for Transfer Assistance: cues for transition position and use of UEs to self assist Ambulation/Gait Ambulation/Gait Assistance: 4: Min assist Ambulation Distance (Feet): 220 Feet Assistive device: Rolling walker Ambulation/Gait Assistance Details: cues for posture and position from RW Gait Pattern: Step-through pattern Gait velocity: slow General Gait Details: general instability largely  corrected with use of RW Stairs: No    Exercises     PT Diagnosis: Difficulty walking;Acute pain  PT Problem List: Decreased strength;Decreased activity tolerance;Decreased mobility;Decreased knowledge of use of DME;Pain PT Treatment Interventions: DME instruction;Gait training;Stair training;Functional mobility training;Therapeutic activities;Therapeutic exercise;Patient/family education     PT Goals(Current goals can be found in the care plan section) Acute Rehab PT Goals Patient Stated Goal: Decreased pain and return home PT Goal Formulation: With patient Potential to Achieve Goals: Good  Visit Information  Last PT Received On: 09/08/12 Assistance Needed: +1 (+2 for equipment - O2, Pulse Ox and IV))       Prior Functioning  Home Living Family/patient expects to be discharged to:: Private residence Living Arrangements: Children Available Help at Discharge: Family Type of Home: House Home Access: Stairs to enter Secretary/administrator of Steps: 3 Entrance Stairs-Rails: Can reach both;Right;Left Home Layout: One level Home Equipment: Walker - 2 wheels Prior Function Level of Independence: Independent with assistive device(s);Independent Comments: pt states occasionally used cane Communication Communication: No difficulties    Cognition  Cognition Arousal/Alertness: Awake/alert Behavior During Therapy: WFL for tasks assessed/performed Overall Cognitive Status: Within Functional Limits for tasks assessed    Extremity/Trunk Assessment Upper Extremity Assessment Upper Extremity Assessment: RUE deficits/detail RUE Deficits / Details: strength to 3/5 and shoulder elevation to 90 - pt pain ltd RUE: Unable to fully assess due to pain Lower Extremity Assessment Lower Extremity Assessment: Overall WFL for tasks assessed   Balance    End of Session PT - End of Session Equipment Utilized During Treatment: Oxygen Activity Tolerance: Patient limited by pain;Patient limited by  fatigue Patient left:  in chair;with call bell/phone within reach Nurse Communication: Mobility status;Other (comment) (O2 Sats)  GP Functional Assessment Tool Used: Clinical judgement Functional Limitation: Mobility: Walking and moving around Mobility: Walking and Moving Around Current Status [phone]): At least 20 percent but less than 40 percent impaired, limited or restricted Mobility: Walking and Moving Around Goal Status [phone]): At least 1 percent but less than 20 percent impaired, limited or restricted   [name] 09/08/2012, 1:50 PM

## 2012-09-08 NOTE — Progress Notes (Signed)
  Echocardiogram 2D Echocardiogram has been performed.  [name] 09/08/2012, 11:18 AM

## 2012-09-09 LAB — BASIC METABOLIC PANEL
BUN: 26 mg/dL — ABNORMAL HIGH (ref 6–23)
CO2: 31 mEq/L (ref 19–32)
Chloride: 96 mEq/L (ref 96–112)
Creatinine, Ser: 1.87 mg/dL — ABNORMAL HIGH (ref 0.50–1.10)
GFR calc Af Amer: 30 mL/min — ABNORMAL LOW (ref >90)
Glucose, Bld: 93 mg/dL (ref 70–99)
Potassium: 4.3 mEq/L (ref 3.5–5.1)

## 2012-09-09 LAB — CBC
HCT: 32.2 % — ABNORMAL LOW (ref 36.0–46.0)
Hemoglobin: 10.2 g/dL — ABNORMAL LOW (ref 12.0–15.0)
MCV: 97.6 fL (ref 78.0–100.0)
RBC: 3.3 MIL/uL — ABNORMAL LOW (ref 3.87–5.11)
WBC: 4.2 10*3/uL (ref 4.0–10.5)

## 2012-09-09 MED ORDER — POLYETHYLENE GLYCOL 3350 17 G PO PACK
17.0000 g | PACK | Freq: Every day | ORAL | Status: DC
  Filled 2012-09-09 (×2): qty 1

## 2012-09-09 NOTE — Progress Notes (Signed)
[facility] PROGRESS NOTE  [name] ZOX:[id number] DOB: 10/08/1940 DOA: 09/07/2012 PCP: [name], MD  Assessment/Plan:  Acute diastolic congestive heart failure  -Chest X ray with pulmonary edema, atelectasis.  -Patient with improvement of dyspnea. Received 20 mg IV lasix 7-24.  -Cr increase to 2.  lasix to 40 mg daily 7-25.   -ECHO with normal Ef, diastolic dysfunction grade 2.  -BNP at 2407. -Patient feeling better, might be able to go home tomorrow if kidney function stable. -Check oxygen on ambulation to see if patient will require home oxygen.  -Water restriction 1 L.  -She will need close outpatient  follow up, for repeat renal function and increase lasix dose.   CKD (chronic kidney disease)  - baseline Cr 1.4 - 1.6  -Avoid nephrotoxins and hypotension.  -Will hold Norvasc to avoid hypotension.  -Strict I and O. Good urine output.  -UA without cast.  -cr decrease to 1.8 from 2. Monitor on lasix.   ANEMIA-IRON DEFICIENCY  - Hg and Hct stable.  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  Family Communication: Care discussed with patient.  Disposition Plan: PT has not seen patient.      Procedures: Irrigation of nonhealing right lower quadrant open abdominal wound and 2 layer closure 7-24   Antibiotics: Received clindamycin.   HPI/Subjective: Patient denies SOB, chest pain. No diarrhea or vomiting. Feeling well. Mild abdomen soreness.   Objective: Filed Vitals:   09/08/12 1422 09/08/12 2200 09/08/12 2302 09/09/12 0600  BP: 103/65 92/52 111/42 130/70  Pulse: 64 64 65 67  Temp: 98.5 F (36.9 C) 98.4 F (36.9 C)  98.1 F (36.7 C)  TempSrc: Oral Oral  Oral  Resp: 16 16  16   Height:      Weight:    78 kg (171  lb 15.3 oz)  SpO2: 97% 95%  98%    Intake/Output Summary (Last 24 hours) at 09/09/12 1003 Last data filed at 09/09/12 0200  Gross per 24 hour  Intake    960 ml  Output   1550 ml  Net   -590 ml   Filed Weights   09/07/12 1741 09/08/12 0551 09/09/12 0600  Weight: 73.936 kg (163 lb) 75.4 kg (166 lb 3.6 oz) 78 kg (171 lb 15.3 oz)    Exam:   General:  No distress.   Cardiovascular: S 1, S 2 RRR  Respiratory: Crackles right side.   Abdomen: BS present, soft, NT, incision site with dressing.   Musculoskeletal: trace edema.   Data Reviewed: Basic Metabolic Panel:  Recent Labs Lab 09/07/12 1842 09/08/12 0504 09/09/12 0541  NA  --  138 137  K  --  5.0 4.3  CL  --  97 96  CO2  --  32 31  GLUCOSE  --  97 93  BUN  --  31* 26*  CREATININE 1.96* 2.06* 1.87*  CALCIUM  --  9.3 9.0   Liver Function Tests: No results found for this basename: AST, ALT, ALKPHOS, BILITOT, PROT, ALBUMIN,  in the last 168 hours No results found for this basename: LIPASE, AMYLASE,  in the last 168 hours No results found for this basename: AMMONIA,  in the last 168 hours CBC:  Recent Labs Lab 09/07/12 1842 09/08/12 0504 09/09/12 0541  WBC 5.5 6.7 4.2  HGB 11.4* 10.4* 10.2*  HCT 35.5* 32.5* 32.2*  MCV 98.9 98.8 97.6  PLT 195 199 176   Cardiac Enzymes: No results found for this basename: CKTOTAL, CKMB, CKMBINDEX, TROPONINI,  in the last 168 hours BNP (last 3 results)  Recent Labs  06/02/12 0435 06/05/12 1205 09/08/12 0504  PROBNP 3699.0* 1043.0* 2407.0*   CBG: No results found for this basename: GLUCAP,  in the last 168 hours  No results found for this or any previous visit (from the past 240 hour(s)).   Studies: X-ray Chest Pa Or Ap  09/07/2012   *RADIOLOGY REPORT*  Clinical Data: Postop (abdominal wound clean and closure)  CHEST - 1 VIEW  Comparison: 06/13/2012; 06/05/2012  Findings:  Grossly unchanged enlarged cardiac silhouette and mediastinal contours with atherosclerotic  calcifications within the thoracic aorta.  The pulmonary vasculature is less distinct with cephalization of flow.  Trace bilateral effusions are not excluded. Worsening bibasilar opacities.  No definite pneumothorax.  Stable postsurgical change of the posterior aspect of the left fifth rib. Post lower cervical ACDF, incompletely evaluated.  IMPRESSION: Suspected mild pulmonary edema with possible trace effusions and worsening bibasilar atelectasis.   Original Report Authenticated By: Tacey Ruiz, MD    Scheduled Meds: . citalopram  10 mg Oral q morning - 10a  . enoxaparin (LOVENOX) injection  30 mg Subcutaneous Q24H  . feeding supplement  237 mL Oral Q24H  . furosemide  40 mg Oral Daily  . metoprolol succinate  25 mg Oral q morning - 10a   Continuous Infusions:   Principal Problem:   Acute diastolic congestive heart failure Active Problems:   CARCINOMA, LUNG, SQUAMOUS CELL   ANEMIA-IRON DEFICIENCY   C O P D   BARRETTS ESOPHAGUS   CKD (chronic kidney disease)   Dehiscence of surgical wound-fascial    Time spent: 25 minutes.     [name],[name]  [facility] Pager [phone]. If 7PM-7AM, please contact night-coverage at [email or web address], password [facility] 09/09/2012, 10:03 AM  LOS: 2 days

## 2012-09-09 NOTE — Progress Notes (Signed)
Patient ambulated in hallway with assist times one on room air.  Upon ambulation oxygen saturation dropped to 83-85% patient states she just "feels tired".  Upon resting sats quickly came back up to 93%.

## 2012-09-09 NOTE — Progress Notes (Signed)
2 Days Post-Op  Subjective: No complaints, No SOB  Objective: Vital signs in last 24 hours: Temp:  [98.1 F (36.7 C)-98.5 F (36.9 C)] 98.1 F (36.7 C) (07/26 0600) Pulse Rate:  [64-67] 67 (07/26 0600) Resp:  [16] 16 (07/26 0600) BP: (92-130)/(42-70) 130/70 mmHg (07/26 0600) SpO2:  [95 %-98 %] 98 % (07/26 0600) Weight:  [171 lb 15.3 oz (78 kg)] 171 lb 15.3 oz (78 kg) (07/26 0600) Last BM Date: 09/06/12  Intake/Output from previous day: 07/25 0701 - 07/26 0700 In: 960 [P.O.:960] Out: 1550 [Urine:1550] Intake/Output this shift:    General appearance: alert, cooperative and no distress Resp: nonlabored, on 2L [facility]: RLQ incision looks okay.  some serous drainage on bandage, no erythema, minimal tenderness  Lab Results:   Recent Labs  09/08/12 0504 09/09/12 0541  WBC 6.7 4.2  HGB 10.4* 10.2*  HCT 32.5* 32.2*  PLT 199 176   BMET  Recent Labs  09/08/12 0504 09/09/12 0541  NA 138 137  K 5.0 4.3  CL 97 96  CO2 32 31  GLUCOSE 97 93  BUN 31* 26*  CREATININE 2.06* 1.87*  CALCIUM 9.3 9.0   PT/INR No results found for this basename: LABPROT, INR,  in the last 72 hours ABG No results found for this basename: PHART, PCO2, PO2, HCO3,  in the last 72 hours  Studies/Results: X-ray Chest Pa Or Ap  09/07/2012   *RADIOLOGY REPORT*  Clinical Data: Postop (abdominal wound clean and closure)  CHEST - 1 VIEW  Comparison: 06/13/2012; 06/05/2012  Findings:  Grossly unchanged enlarged cardiac silhouette and mediastinal contours with atherosclerotic calcifications within the thoracic aorta.  The pulmonary vasculature is less distinct with cephalization of flow.  Trace bilateral effusions are not excluded. Worsening bibasilar opacities.  No definite pneumothorax.  Stable postsurgical change of the posterior aspect of the left fifth rib. Post lower cervical ACDF, incompletely evaluated.  IMPRESSION: Suspected mild pulmonary edema with possible trace effusions and worsening bibasilar  atelectasis.   Original Report Authenticated By: Tacey Ruiz, MD    Anti-infectives: Anti-infectives   Start     Dose/Rate Route Frequency Ordered Stop   09/07/12 1045  clindamycin (CLEOCIN) 900 mg, gentamicin (GARAMYCIN) 240 mg in sodium chloride 0.9 % 1,000 mL for intraperitoneal lavage      Intraperitoneal To Surgery 09/07/12 1036 09/07/12 1205   09/07/12 0916  vancomycin (VANCOCIN) IVPB 1000 mg/200 mL premix     1,000 mg 200 mL/hr over 60 Minutes Intravenous On call to O.R. 09/07/12 0916 09/07/12 1120      Assessment/Plan: s/p Procedure(s):  ABDOMINAL WOUND IRRIGATION AND TWO LAYERED CLOSURE (N/A) wound looks okay.  treatment of CHF and kidney disease by hospitalist and should be okay for discharge when medically stable and off oxygen.  LOS: 2 days    Lodema Pilot DAVID 09/09/2012

## 2012-09-09 NOTE — Progress Notes (Signed)
Physical Therapy Treatment Patient Details Name: [name] MRN: [id number] DOB: 31-Jan-1941 Today's Date: 09/09/2012 Time: 4098-1191 PT Time Calculation (min): 35 min  PT Assessment / Plan / Recommendation  History of Present Illness     Clinical Impression    PT Comments     Follow Up Recommendations  Home health PT;Supervision/Assistance - 24 hour     Does the patient have the potential to tolerate intense rehabilitation     Barriers to Discharge        Equipment Recommendations  None recommended by PT    Recommendations for Other Services OT consult  Frequency Min 3X/week   Progress towards PT Goals Progress towards PT goals: Progressing toward goals  Plan Current plan remains appropriate    Precautions / Restrictions Precautions Precautions: Fall Restrictions Weight Bearing Restrictions: No   Pertinent Vitals/Pain SATURATION QUALIFICATIONS: (This note is used to comply with regulatory documentation for home oxygen)   Patient Saturations on Room Air while Ambulating = 85%+  Patient Saturations on 1 Liters of oxygen while Ambulating = 91%+      Mobility  Transfers Transfers: Sit to Stand;Stand to Sit Sit to Stand: 4: Min assist Stand to Sit: 4: Min guard Details for Transfer Assistance: cues for transition position and use of UEs to self assist Ambulation/Gait Ambulation/Gait Assistance: 4: Min assist Ambulation Distance (Feet): 400 Feet Assistive device: Rolling walker Ambulation/Gait Assistance Details: cues for position from RW  Gait Pattern: Step-through pattern Gait velocity: slow General Gait Details: general instability largely corrected with use of RW Stairs: No    Exercises     PT Diagnosis:    PT Problem List:   PT Treatment Interventions:     PT Goals (current goals can now be found in the care plan section) Acute Rehab PT Goals Patient Stated Goal: Decreased pain and return home PT Goal Formulation: With patient Potential to  Achieve Goals: Good  Visit Information  Last PT Received On: 09/09/12 Assistance Needed: +1    Subjective Data  Subjective: Lets walk Patient Stated Goal: Decreased pain and return home   Cognition  Cognition Arousal/Alertness: Awake/alert Behavior During Therapy: WFL for tasks assessed/performed Overall Cognitive Status: Within Functional Limits for tasks assessed    Balance     End of Session PT - End of Session Activity Tolerance: Patient limited by pain;Patient limited by fatigue Patient left: in chair;with call bell/phone within reach Nurse Communication: Mobility status   GP     Solomon Skowronek 09/09/2012, 5:11 PM

## 2012-09-10 LAB — BASIC METABOLIC PANEL
BUN: 25 mg/dL — ABNORMAL HIGH (ref 6–23)
CO2: 30 mEq/L (ref 19–32)
Chloride: 97 mEq/L (ref 96–112)
Creatinine, Ser: 1.6 mg/dL — ABNORMAL HIGH (ref 0.50–1.10)
Glucose, Bld: 90 mg/dL (ref 70–99)

## 2012-09-10 MED ORDER — POLYETHYLENE GLYCOL 3350 17 G PO PACK: 17.0000 g | PACK | Freq: Every day | ORAL | Status: DC

## 2012-09-10 MED ORDER — FUROSEMIDE 40 MG PO TABS: 40.0000 mg | ORAL_TABLET | Freq: Every day | ORAL | Status: DC

## 2012-09-10 NOTE — Progress Notes (Addendum)
[facility] PROGRESS NOTE  [name] GUY:[id number] DOB: July 16, 1940 DOA: 09/07/2012 PCP: Jody Barre, MD  Assessment/Plan:  Acute diastolic congestive heart failure  -Discharge on lasix 40 mg daily. Patient was taking 40 mg daily at home.  I review discharge Medication list. Made appropriate changes. Stop Norvasc at discharge. Discharge on lasix 40 mg daily.  -Chest X ray with pulmonary edema, atelectasis.  -Patient with improvement of dyspnea. Received 20 mg IV lasix 7-24.  -Cr increase to 2.  lasix to 40 mg daily 7-25.   -ECHO with normal Ef, diastolic dysfunction grade 2.  -BNP at 2407. -Check oxygen on ambulation to see if patient will require home oxygen.  -Water restriction 1 L.  -She will need close outpatient  follow up, for repeat renal function.   CKD (chronic kidney disease)  - baseline Cr 1.4 - 1.6  -Avoid nephrotoxins and hypotension.  -Will hold Norvasc to avoid hypotension.  -Strict I and O. Good urine output.  -UA without cast.  -cr decrease to 1.6 from 2. Monitor on lasix.   ANEMIA-IRON DEFICIENCY  - Hg and Hct stable.  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  Family Communication: Care discussed with patient.  Disposition Plan: PT has not seen patient.      Procedures: Irrigation of nonhealing right lower quadrant open abdominal wound and 2 layer closure 7-24   Antibiotics: Received clindamycin.   HPI/Subjective: No SOB.    Objective: Filed Vitals:   09/09/12 1254 09/09/12 1450 09/09/12 2200 09/10/12 0600  BP:  116/67 121/68 105/55  Pulse:  66 65 62  Temp:  98.1 F (36.7 C) 98.9 F (37.2 C) 98.7 F (37.1 C)  TempSrc:  Oral Oral Oral  Resp:  18 18 18   Height:      Weight:    75 kg (165 lb  5.5 oz)  SpO2: 85% 99% 100% 99%    Intake/Output Summary (Last 24 hours) at 09/10/12 0920 Last data filed at 09/10/12 0915  Gross per 24 hour  Intake    720 ml  Output   1100 ml  Net   -380 ml   Filed Weights   09/08/12 0551 09/09/12 0600 09/10/12 0600  Weight: 75.4 kg (166 lb 3.6 oz) 78 kg (171 lb 15.3 oz) 75 kg (165 lb 5.5 oz)    Exam:   General:  No distress.   Cardiovascular: S 1, S 2 RRR  Respiratory: Crackles right side.   Abdomen: BS present, soft, NT, incision site with dressing.   Musculoskeletal: trace edema.   Data Reviewed: Basic Metabolic Panel:  Recent Labs Lab 09/07/12 1842 09/08/12 0504 09/09/12 0541 09/10/12 0546  NA  --  138 137 135  K  --  5.0 4.3 4.4  CL  --  97 96 97  CO2  --  32 31 30  GLUCOSE  --  97 93 90  BUN  --  31* 26* 25*  CREATININE 1.96* 2.06* 1.87* 1.60*  CALCIUM  --  9.3 9.0 9.4   Liver Function Tests: No results found for this basename: AST, ALT, ALKPHOS, BILITOT, PROT, ALBUMIN,  in the last 168 hours No results found for this basename: LIPASE, AMYLASE,  in the last  168 hours No results found for this basename: AMMONIA,  in the last 168 hours CBC:  Recent Labs Lab 09/07/12 1842 09/08/12 0504 09/09/12 0541  WBC 5.5 6.7 4.2  HGB 11.4* 10.4* 10.2*  HCT 35.5* 32.5* 32.2*  MCV 98.9 98.8 97.6  PLT 195 199 176   Cardiac Enzymes: No results found for this basename: CKTOTAL, CKMB, CKMBINDEX, TROPONINI,  in the last 168 hours BNP (last 3 results)  Recent Labs  06/02/12 0435 06/05/12 1205 09/08/12 0504  PROBNP 3699.0* 1043.0* 2407.0*   CBG: No results found for this basename: GLUCAP,  in the last 168 hours  No results found for this or any previous visit (from the past 240 hour(s)).   Studies: No results found.  Scheduled Meds: . citalopram  10 mg Oral q morning - 10a  . enoxaparin (LOVENOX) injection  30 mg Subcutaneous Q24H  . feeding supplement  237 mL Oral Q24H  . furosemide  40 mg Oral Daily  .  metoprolol succinate  25 mg Oral q morning - 10a  . polyethylene glycol  17 g Oral Daily   Continuous Infusions:   Principal Problem:   Acute diastolic congestive heart failure Active Problems:   CARCINOMA, LUNG, SQUAMOUS CELL   ANEMIA-IRON DEFICIENCY   C O P D   BARRETTS ESOPHAGUS   CKD (chronic kidney disease)   Dehiscence of surgical wound-fascial    Time spent: 25 minutes.     [name]  [facility] Pager [phone]. If 7PM-7AM, please contact night-coverage at [email or web address], password [facility] 09/10/2012, 9:20 AM  LOS: 3 days

## 2012-09-10 NOTE — Care Management Note (Signed)
Received referral for home health services for PT/RN/home health aide. Spoke with patient at bedside to offer choice. She chose [facility]. Made referral to [facility], Asher Muir, for referral and made aware discharge is today. Patient lives with grandson. Her daugther, [name], takes her to her appointments and assists her as needed. Spoke with [name] as well to discuss [facility] following for home health services. Reports there are not any dme needs. Reinforced the importance of Mrs Witts having scale to weigh daily and etc. PCP is Dr [name]. Requested that [facility] make appointment with patient's PCP for this upcoming week. No further needs assessed. [name] reports her cell number is [phone].  [place], [place] [phone]

## 2012-09-10 NOTE — Progress Notes (Signed)
Physical Therapy Treatment Patient Details Name: [name] MRN: [id number] DOB: [birth year] Today's Date: 09/10/2012 Time: 4098-1191 PT Time Calculation (min): 21 min  PT Assessment / Plan / Recommendation  History of Present Illness     Clinical Impression    PT Comments   Pt hoping to d/c home this date  Follow Up Recommendations  Home health PT;Supervision/Assistance - 24 hour     Does the patient have the potential to tolerate intense rehabilitation     Barriers to Discharge        Equipment Recommendations  None recommended by PT    Recommendations for Other Services OT consult  Frequency Min 3X/week   Progress towards PT Goals Progress towards PT goals: Progressing toward goals  Plan Current plan remains appropriate    Precautions / Restrictions Precautions Precautions: Fall Restrictions Weight Bearing Restrictions: No   Pertinent Vitals/Pain SATURATION QUALIFICATIONS: (This note is used to comply with regulatory documentation for home oxygen)  Patient Saturations on Room Air at Rest = 95%  Patient Saturations on Room Air while Ambulating = 86%+    Mobility  Transfers Transfers: Sit to Stand;Stand to Sit Sit to Stand: 4: Min guard Stand to Sit: 5: Supervision Details for Transfer Assistance: cues for transition position and use of UEs to self assist Ambulation/Gait Ambulation/Gait Assistance: 4: Min guard;5: Supervision Ambulation Distance (Feet): 400 Feet Assistive device: Rolling walker Ambulation/Gait Assistance Details: min cues for position from RW Gait Pattern: Step-through pattern General Gait Details: general instability largely corrected with use of RW Stairs: No    Exercises     PT Diagnosis:    PT Problem List:   PT Treatment Interventions:     PT Goals (current goals can now be found in the care plan section) Acute Rehab PT Goals Patient Stated Goal: Decreased pain and return home PT Goal Formulation: With patient Potential to  Achieve Goals: Good  Visit Information  Last PT Received On: 09/10/12 Assistance Needed: +1    Subjective Data  Subjective: I want to put my shoes on Patient Stated Goal: Decreased pain and return home   Cognition  Cognition Arousal/Alertness: Awake/alert Behavior During Therapy: WFL for tasks assessed/performed Overall Cognitive Status: Within Functional Limits for tasks assessed    Balance     End of Session PT - End of Session Equipment Utilized During Treatment: Oxygen Activity Tolerance: Patient limited by pain;Patient limited by fatigue Patient left: in chair;with call bell/phone within reach Nurse Communication: Mobility status   GP     Orion Mole 09/10/2012, 12:35 PM

## 2012-09-10 NOTE — Progress Notes (Addendum)
3 Days Post-Op  Subjective: No complaints  Objective: Vital signs in last 24 hours: Temp:  [98.1 F (36.7 C)-98.9 F (37.2 C)] 98.7 F (37.1 C) (07/27 0600) Pulse Rate:  [62-66] 62 (07/27 0600) Resp:  [18] 18 (07/27 0600) BP: (105-121)/(42-68) 105/55 mmHg (07/27 0600) SpO2:  [85 %-100 %] 99 % (07/27 0600) Weight:  [165 lb 5.5 oz (75 kg)] 165 lb 5.5 oz (75 kg) (07/27 0600) Last BM Date: 09/09/12  Intake/Output from previous day: 07/26 0701 - 07/27 0700 In: 720 [P.O.:720] Out: 900 [Urine:900] Intake/Output this shift:    General appearance: alert, cooperative and no distress Resp: nonlabored, 96%RA Cardio: normal rate GI: soft, mild incisional tenderness, ND, wound looks good  Lab Results:   Recent Labs  09/08/12 0504 09/09/12 0541  WBC 6.7 4.2  HGB 10.4* 10.2*  HCT 32.5* 32.2*  PLT 199 176   BMET  Recent Labs  09/09/12 0541 09/10/12 0546  NA 137 135  K 4.3 4.4  CL 96 97  CO2 31 30  GLUCOSE 93 90  BUN 26* 25*  CREATININE 1.87* 1.60*  CALCIUM 9.0 9.4   PT/INR No results found for this basename: LABPROT, INR,  in the last 72 hours ABG No results found for this basename: PHART, PCO2, PO2, HCO3,  in the last 72 hours  Studies/Results: No results found.  Anti-infectives: Anti-infectives   Start     Dose/Rate Route Frequency Ordered Stop   09/07/12 1045  clindamycin (CLEOCIN) 900 mg, gentamicin (GARAMYCIN) 240 mg in sodium chloride 0.9 % 1,000 mL for intraperitoneal lavage      Intraperitoneal To Surgery 09/07/12 1036 09/07/12 1205   09/07/12 0916  vancomycin (VANCOCIN) IVPB 1000 mg/200 mL premix     1,000 mg 200 mL/hr over 60 Minutes Intravenous On call to O.R. 09/07/12 0916 09/07/12 1120      Assessment/Plan: s/p Procedure(s):  ABDOMINAL WOUND IRRIGATION AND TWO LAYERED CLOSURE (N/A) wound looks good.  breathing comfortably but still on oxygen.  will see how she does off oxygen but hopefully home today  LOS: 3 days    Clive Parcel  DAVID 09/10/2012  I spoke with Dr. Sunnie Nielsen and she has reviewed her medications and set her up for home oxygen with ambulation and home health.  She should be okay for discharge to home with follow up PCP in 2-3 days.

## 2012-09-14 ENCOUNTER — Encounter: Payer: Self-pay | Admitting: Internal Medicine

## 2012-09-14 ENCOUNTER — Telehealth (INDEPENDENT_AMBULATORY_CARE_PROVIDER_SITE_OTHER): Payer: Self-pay

## 2012-09-14 ENCOUNTER — Ambulatory Visit (INDEPENDENT_AMBULATORY_CARE_PROVIDER_SITE_OTHER): Payer: Medicare Other | Admitting: Internal Medicine

## 2012-09-14 ENCOUNTER — Other Ambulatory Visit (INDEPENDENT_AMBULATORY_CARE_PROVIDER_SITE_OTHER): Payer: Self-pay | Admitting: General Surgery

## 2012-09-14 VITALS — BP 120/80 | HR 65 | Temp 98.2°F | Wt 163.2 lb

## 2012-09-14 DIAGNOSIS — I10 Essential (primary) hypertension: Secondary | ICD-10-CM

## 2012-09-14 DIAGNOSIS — I509 Heart failure, unspecified: Secondary | ICD-10-CM

## 2012-09-14 DIAGNOSIS — M25519 Pain in unspecified shoulder: Secondary | ICD-10-CM

## 2012-09-14 DIAGNOSIS — I5031 Acute diastolic (congestive) heart failure: Secondary | ICD-10-CM

## 2012-09-14 DIAGNOSIS — R269 Unspecified abnormalities of gait and mobility: Secondary | ICD-10-CM

## 2012-09-14 DIAGNOSIS — M25511 Pain in right shoulder: Secondary | ICD-10-CM

## 2012-09-14 NOTE — Progress Notes (Signed)
Subjective:    Patient ID: [name], female    DOB: 11-Mar-1940, 72 y.o.   MRN: [id number]  HPI  Here to f/u; overall doing ok, wt stable on home and no change in swelling on current meds,  Pt denies chest pain, increased sob or doe, wheezing, orthopnea, PND, increased LE swelling, palpitations, dizziness or syncope.  Pt denies polydipsia, polyuria, or low sugar symptoms such as weakness or confusion improved with po intake.  Pt denies new neurological symptoms such as new headache, or facial or extremity weakness or numbness.   Pt states overall good compliance with meds, has been trying to follow low salt diet.  Still with ongoing chronic right shoudler pain and inability to reach overhead but still refuses ortho eval or tx, even for cortisone.  ALso has gait difficulty, was referred to [facility] for post hospn care but she wants [facility] that she had prior to recent hospn.   Past Medical History  Diagnosis Date  . HYPERLIPIDEMIA 11/18/2006  . HYPERKALEMIA 02/05/2008  . ANEMIA-IRON DEFICIENCY 09/22/2008  . Anemia of other chronic disease 02/05/2008  . ANXIETY 06/03/2008  . DEPRESSION 04/28/2009  . HYPERTENSION 11/18/2006  . PERICARDITIS 01/03/2007  . AORTIC STENOSIS/ INSUFFICIENCY, NON-RHEUMATIC 12/03/2008  . SINUSITIS- ACUTE-NOS 11/20/2007  . SINUSITIS, CHRONIC 02/05/2008  . ALLERGIC RHINITIS 11/18/2006  . PNEUMONIA 05/02/2007  . C O P D 06/27/2008  . Stricture and stenosis of esophagus 11/14/2008  . GERD 01/03/2007  . BARRETTS ESOPHAGUS 11/14/2008  . DYSPEPSIA 03/23/2007  . PRURITUS 10/08/2008  . OSTEOARTHRITIS 11/18/2006  . OSTEOARTHRITIS, KNEE, LEFT 01/03/2007  . Cervicalgia 01/13/2009  . BACK PAIN 02/04/2009    is improved  . BURSITIS, LEFT HIP 04/18/2009  . OSTEOPOROSIS 11/18/2006  . INSOMNIA-SLEEP DISORDER-UNSPEC 06/03/2008  . FATIGUE 04/18/2009  . PERIPHERAL EDEMA 02/13/2009  . MURMUR 11/18/2006  . DYSPHAGIA UNSPECIFIED 03/23/2007  . Abdominal pain, generalized 03/01/2007  .  Nonspecific (abnormal) findings on radiological and other examination of body structure 06/03/2008  . Tuberculin Test Reaction 11/18/2006  . Acute bronchitis 02/23/2010  . CHOLELITHIASIS 04/24/2010  . NEPHROLITHIASIS, HX OF 04/24/2010  . Polyarthralgia 06/09/2010  . Elevated sed rate 06/11/2010  . Positive ANA (antinuclear antibody) 06/11/2010  . Rheumatoid factor positive 06/11/2010  . Cancer     lung ca  . 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 SPONDYLOSIS, CERVICAL, WITH RADICULOPATHY 01/13/2009  . Cervical radiculopathy 09/28/2010  . Lumbar radiculopathy 09/28/2010  . RENAL INSUFFICIENCY 02/05/2008    function has improved.  . Transfusion history 09-01-12    4'14 tranfused x 2 units  . Bleeding nose 09-01-12    past hx. none recent after having surgery  . Wound healing, delayed 09-01-12    right abdominal area   Past Surgical History  Procedure Laterality Date  . Abdominal hysterectomy    . Tubal ligation    . Tumor removal  08/06/08    from lungs  . Anterior cervical decomp/discectomy fusion  01/15/2011    Procedure: ANTERIOR CERVICAL DECOMPRESSION/DISCECTOMY FUSION 2 LEVELS;  Surgeon: Kathaleen Maser Pool;  Location: [facility];  Service: Neurosurgery;  Laterality: N/A;  Cervical Three-Four, Cervical Four-Five Anterior Cervical Decompression Fusion   . Colonscopy    . Nose surgery    . Laparoscopic appendectomy N/A 05/25/2012    Procedure: APPENDECTOMY LAPAROSCOPIC  CONVERTED TO  OPEN APPENDECTOMY, exploratory laparatomy;  Surgeon: Adolph Pollack, MD;  Location: [facility];  Service: General;  Laterality: N/A;  . Bowel resection N/A 05/25/2012  Procedure: SMALL BOWEL RESECTION;  Surgeon: Adolph Pollack, MD;  Location: [facility];  Service: General;  Laterality: N/A;  . Laparoscopic lysis of adhesions N/A 05/25/2012    Procedure: LAPAROSCOPIC LYSIS OF ADHESIONS;  Surgeon: Adolph Pollack, MD;  Location: [facility];  Service: General;  Laterality: N/A;  . Appendectomy    . Cataract extraction,  bilateral  09-01-12  . Debridement of abdominal wall abscess N/A 09/07/2012    Procedure:  ABDOMINAL WOUND IRRIGATION AND TWO LAYERED CLOSURE;  Surgeon: Adolph Pollack, MD;  Location: [facility];  Service: General;  Laterality: N/A;    reports that she quit smoking about 6 years ago. Her smoking use included Cigarettes. She has a 60 pack-year smoking history. She has quit using smokeless tobacco. She reports that she does not drink alcohol or use illicit drugs. family history includes Cancer in her brother; Coronary artery disease in her other; Heart disease in her father; Hyperlipidemia in her other; and Hypertension in her other.  There is no history of Colon cancer, and Esophageal cancer, and Rectal cancer, and Stomach cancer, . Allergies  Allergen Reactions  . Amoxicillin Itching  . Fentanyl Other (See Comments)    hallucinations  . Hydrocodone Other (See Comments)    ineffective  . Codeine Hives and Rash  . Penicillins Hives and Rash   Current Outpatient Prescriptions on File Prior to Visit  Medication Sig Dispense Refill  . albuterol (PROVENTIL HFA;VENTOLIN HFA) 108 (90 BASE) MCG/ACT inhaler Inhale 1-2 puffs into the lungs 4 (four) times daily as needed for wheezing or shortness of breath. For wheezing or shortness of breath.      Marland Kitchen alendronate (FOSAMAX) 70 MG tablet Take 1 tablet (70 mg total) by mouth every 7 (seven) days. Take with a full glass of water on an empty stomach. Patient takes on Mondays.  4 tablet  11  . ALPRAZolam (XANAX) 0.5 MG tablet Take 0.5 mg by mouth daily as needed for anxiety.      . citalopram (CELEXA) 10 MG tablet Take 10 mg by mouth every morning.      . feeding supplement (ENSURE COMPLETE) LIQD Take 237 mLs by mouth daily.      . furosemide (LASIX) 40 MG tablet Take 1 tablet (40 mg total) by mouth daily.  30 tablet  0  . metoprolol succinate (TOPROL-XL) 25 MG 24 hr tablet Take 25 mg by mouth every morning.      Marland Kitchen oxyCODONE (OXY IR/ROXICODONE) 5 MG immediate  release tablet Take 1-2 tablets (5-10 mg total) by mouth every 4 (four) hours as needed for pain.  40 tablet  0  . pantoprazole (PROTONIX) 40 MG tablet Take 40 mg by mouth daily as needed (for heartburn).      . polyethylene glycol (MIRALAX / GLYCOLAX) packet Take 17 g by mouth daily.  14 each  0  . pravastatin (PRAVACHOL) 40 MG tablet Take 1 tablet (40 mg total) by mouth daily.  90 tablet  3  . zolpidem (AMBIEN) 5 MG tablet Take 1 tablet (5 mg total) by mouth at bedtime as needed. For sleep.  30 tablet  0   No current facility-administered medications on file prior to visit.    Review of Systems  Constitutional: Negative for unexpected weight change, or unusual diaphoresis  HENT: Negative for tinnitus.   Eyes: Negative for photophobia and visual disturbance.  Respiratory: Negative for choking and stridor.   Gastrointestinal: Negative for vomiting and blood in stool.  Genitourinary: Negative  for hematuria and decreased urine volume.  Musculoskeletal: Negative for acute joint swelling Skin: Negative for color change and wound.  Neurological: Negative for tremors and numbness other than noted  Psychiatric/Behavioral: Negative for decreased concentration or  hyperactivity.       Objective:   Physical Exam BP 120/80  Pulse 65  Temp(Src) 98.2 F (36.8 C) (Oral)  Wt 163 lb 4 oz (74.05 kg)  BMI 29.85 kg/m2  SpO2 93% VS noted, chronic ill Constitutional: Pt appears well-developed and well-nourished/obese.  HENT: Head: NCAT.  Right Ear: External ear normal.  Left Ear: External ear normal.  Eyes: Conjunctivae and EOM are normal. Pupils are equal, round, and reactive to light.  Neck: Normal range of motion. Neck supple.  Cardiovascular: Normal rate and regular rhythm.   Pulmonary/Chest: Effort normal and breath sounds normal.  Abd:  Soft, NT, non-distended, + BS Neurological: Pt is alert. Not confused  Skin: Skin is warm. No erythema. trace pedal edema bilat Psychiatric: Pt behavior  is normal. Thought content normal.     Assessment & Plan:

## 2012-09-14 NOTE — Patient Instructions (Signed)
Please continue all other medications as before, and refills have been done if requested. Please continue to monitor your weight at home; please call if your weight goes up more than 5 lbs We can refer you to [facility] You will be contacted regarding the referral for: Dr Sanjuana Kava Please call if you change your mind about referral to orthopedic No further lab work needed today

## 2012-09-14 NOTE — Telephone Encounter (Signed)
[facility] nurse called stating pt would like to use [facility] since he had previously.  New orders will be sent to [facility].  Pt's sister called soon after requesting same.  I told her it may take a day or so before [facility] would start.  She stated the patient is fine and they could wait.  Orders entered in Epic and will forward to referral coordinator.

## 2012-09-17 DIAGNOSIS — R269 Unspecified abnormalities of gait and mobility: Secondary | ICD-10-CM | POA: Insufficient documentation

## 2012-09-17 NOTE — Assessment & Plan Note (Addendum)
stable overall by history and exam,  and pt to continue medical treatment as before,  to f/u any worsening symptoms or concerns, call for wt > 5 lbs increase, refer card sooner than planned  Note:  Total time for pt hx, exam, review of record with pt in the room, determination of diagnoses and plan for further eval and tx is > 40 min, with over 50% spent in coordination and counseling of patient

## 2012-09-17 NOTE — Assessment & Plan Note (Signed)
Ok for change [facility] to [facility] per pt reqeust

## 2012-09-17 NOTE — Assessment & Plan Note (Addendum)
stable overall by history and exam, recent data reviewed with pt, and pt to continue medical treatment as before,  to f/u any worsening symptoms or concerns BP Readings from Last 3 Encounters:  09/14/12 120/80  09/10/12 105/55  09/10/12 105/55

## 2012-09-17 NOTE — Assessment & Plan Note (Signed)
C/w prob chronic rot cuff tear, declines further eval or tx today

## 2012-09-20 ENCOUNTER — Other Ambulatory Visit: Payer: Self-pay | Admitting: *Deleted

## 2012-09-20 ENCOUNTER — Telehealth: Payer: Self-pay | Admitting: Internal Medicine

## 2012-09-20 ENCOUNTER — Other Ambulatory Visit (HOSPITAL_BASED_OUTPATIENT_CLINIC_OR_DEPARTMENT_OTHER): Payer: Medicare Other | Admitting: Lab

## 2012-09-20 ENCOUNTER — Ambulatory Visit (HOSPITAL_BASED_OUTPATIENT_CLINIC_OR_DEPARTMENT_OTHER): Payer: Medicare Other | Admitting: Physician Assistant

## 2012-09-20 VITALS — BP 123/49 | HR 65 | Temp 98.2°F | Resp 20 | Ht 62.0 in | Wt 166.9 lb

## 2012-09-20 DIAGNOSIS — C3490 Malignant neoplasm of unspecified part of unspecified bronchus or lung: Secondary | ICD-10-CM

## 2012-09-20 LAB — CBC WITH DIFFERENTIAL/PLATELET
Basophils Absolute: 0 10*3/uL (ref 0.0–0.1)
EOS%: 3 % (ref 0.0–7.0)
Eosinophils Absolute: 0.1 10*3/uL (ref 0.0–0.5)
HCT: 31.5 % — ABNORMAL LOW (ref 34.8–46.6)
HGB: 10.2 g/dL — ABNORMAL LOW (ref 11.6–15.9)
MCH: 31.5 pg (ref 25.1–34.0)
MONO#: 0.6 10*3/uL (ref 0.1–0.9)
NEUT#: 2.8 10*3/uL (ref 1.5–6.5)
NEUT%: 58.3 % (ref 38.4–76.8)
RDW: 13.9 % (ref 11.2–14.5)
WBC: 4.8 10*3/uL (ref 3.9–10.3)
lymph#: 1.2 10*3/uL (ref 0.9–3.3)

## 2012-09-20 LAB — COMPREHENSIVE METABOLIC PANEL (CC13)
AST: 8 U/L (ref 5–34)
Albumin: 3.2 g/dL — ABNORMAL LOW (ref 3.5–5.0)
BUN: 29.7 mg/dL — ABNORMAL HIGH (ref 7.0–26.0)
CO2: 28 mEq/L (ref 22–29)
Calcium: 9.1 mg/dL (ref 8.4–10.4)
Chloride: 102 mEq/L (ref 98–109)
Creatinine: 1.7 mg/dL — ABNORMAL HIGH (ref 0.6–1.1)
Potassium: 4.2 mEq/L (ref 3.5–5.1)

## 2012-09-20 MED ORDER — METOPROLOL SUCCINATE ER 25 MG PO TB24: 25.0000 mg | ORAL_TABLET | Freq: Every morning | ORAL | Status: DC

## 2012-09-20 NOTE — Telephone Encounter (Signed)
gv and printed appt sched and avs for pt  °

## 2012-09-20 NOTE — Patient Instructions (Addendum)
The CT of your chest done on 06/01/2012 showed no evidence for disease recurrence of disease progression Follow up with Dr. Arbutus Ped in 1 year with a restaging CT scan of your chest to re-evaluate your disease

## 2012-09-21 ENCOUNTER — Telehealth (INDEPENDENT_AMBULATORY_CARE_PROVIDER_SITE_OTHER): Payer: Self-pay

## 2012-09-21 ENCOUNTER — Other Ambulatory Visit (INDEPENDENT_AMBULATORY_CARE_PROVIDER_SITE_OTHER): Payer: Self-pay

## 2012-09-21 MED ORDER — OXYCODONE HCL 5 MG PO TABS: 5.0000 mg | ORAL_TABLET | Freq: Four times a day (QID) | ORAL | Status: DC | PRN

## 2012-09-21 NOTE — Progress Notes (Addendum)
[facility] Telephone:([phone]   [phone]  SHARED VISIT PROGRESS NOTE  [name], MD 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  PRIOR THERAPY:Left upper lobectomy with node dissection and bronchoplasty and intercostal muscle flap under the care of Dr. [name] on 08/06/2008.  CURRENT THERAPY: Observation.  INTERVAL HISTORY: [name] 72 y.o. female returns to the clinic today for annual followup visit accompanied by her daughter.  In the interim she had an acute perforated appendix with generalized peritonitis in April 2014. This was followed by a hospitalization for a non healing wound that required irrigation and surgical closure 09/07/2012. She still has some residual soreness from this procedure. From the standpoint of her history of lung cancer she has been doing well. She did have a CT of her chest on 06/01/2012. She is accompanied by her daughter today to discuss the results. She denied having any significant chest pain, shortness of breath, cough or hemoptysis. She denied having any significant weight loss or night sweats.   MEDICAL HISTORY: Past Medical History  Diagnosis Date  . HYPERLIPIDEMIA 11/18/2006  . HYPERKALEMIA 02/05/2008  . ANEMIA-IRON DEFICIENCY 09/22/2008  . Anemia of other chronic disease 02/05/2008  . ANXIETY 06/03/2008  . DEPRESSION 04/28/2009  . HYPERTENSION 11/18/2006  . PERICARDITIS 01/03/2007  . AORTIC STENOSIS/ INSUFFICIENCY, NON-RHEUMATIC 12/03/2008  . SINUSITIS- ACUTE-NOS 11/20/2007  . SINUSITIS, CHRONIC 02/05/2008  . ALLERGIC RHINITIS 11/18/2006  . PNEUMONIA 05/02/2007  . C O P D 06/27/2008  . Stricture and stenosis of esophagus 11/14/2008  . GERD 01/03/2007  . BARRETTS ESOPHAGUS 11/14/2008  . DYSPEPSIA 03/23/2007  . PRURITUS 10/08/2008  . OSTEOARTHRITIS 11/18/2006    . OSTEOARTHRITIS, KNEE, LEFT 01/03/2007  . Cervicalgia 01/13/2009  . BACK PAIN 02/04/2009    is improved  . BURSITIS, LEFT HIP 04/18/2009  . OSTEOPOROSIS 11/18/2006  . INSOMNIA-SLEEP DISORDER-UNSPEC 06/03/2008  . FATIGUE 04/18/2009  . PERIPHERAL EDEMA 02/13/2009  . MURMUR 11/18/2006  . DYSPHAGIA UNSPECIFIED 03/23/2007  . Abdominal pain, generalized 03/01/2007  . Nonspecific (abnormal) findings on radiological and other examination of body structure 06/03/2008  . Tuberculin Test Reaction 11/18/2006  . Acute bronchitis 02/23/2010  . CHOLELITHIASIS 04/24/2010  . NEPHROLITHIASIS, HX OF 04/24/2010  . Polyarthralgia 06/09/2010  . Elevated sed rate 06/11/2010  . Positive ANA (antinuclear antibody) 06/11/2010  . Rheumatoid factor positive 06/11/2010  . Cancer     lung ca  . 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 SPONDYLOSIS, CERVICAL, WITH RADICULOPATHY 01/13/2009  . Cervical radiculopathy 09/28/2010  . Lumbar radiculopathy 09/28/2010  . RENAL INSUFFICIENCY 02/05/2008    function has improved.  . Transfusion history 09-01-12    4'14 tranfused x 2 units  . Bleeding nose 09-01-12    past hx. none recent after having surgery  . Wound healing, delayed 09-01-12    right abdominal area    ALLERGIES:  is allergic to amoxicillin; fentanyl; hydrocodone; codeine; and penicillins.  MEDICATIONS:  Current Outpatient Prescriptions  Medication Sig Dispense Refill  . albuterol (PROVENTIL HFA;VENTOLIN HFA) 108 (90 BASE) MCG/ACT inhaler Inhale 1-2 puffs into the lungs 4 (four) times daily as needed for wheezing or shortness of breath. For wheezing or shortness of breath.      Marland Kitchen alendronate (FOSAMAX) 70 MG tablet Take 1 tablet (70 mg total) by mouth every 7 (seven) days. Take  with a full glass of water on an empty stomach. Patient takes on Mondays.  4 tablet  11  . ALPRAZolam (XANAX) 0.5 MG tablet Take 0.5 mg by mouth daily as needed for anxiety.      Marland Kitchen amLODipine (NORVASC) 5 MG tablet       . citalopram (CELEXA) 10  MG tablet Take 10 mg by mouth every morning.      . feeding supplement (ENSURE COMPLETE) LIQD Take 237 mLs by mouth daily.      . furosemide (LASIX) 40 MG tablet Take 1 tablet (40 mg total) by mouth daily.  30 tablet  0  . metoprolol succinate (TOPROL-XL) 25 MG 24 hr tablet Take 1 tablet (25 mg total) by mouth every morning.  90 tablet  1  . oxyCODONE (OXY IR/ROXICODONE) 5 MG immediate release tablet Take 1-2 tablets (5-10 mg total) by mouth every 4 (four) hours as needed for pain.  40 tablet  0  . pantoprazole (PROTONIX) 40 MG tablet Take 40 mg by mouth daily as needed (for heartburn).      . polyethylene glycol (MIRALAX / GLYCOLAX) packet Take 17 g by mouth daily.  14 each  0  . pravastatin (PRAVACHOL) 40 MG tablet Take 1 tablet (40 mg total) by mouth daily.  90 tablet  3  . zolpidem (AMBIEN) 5 MG tablet Take 1 tablet (5 mg total) by mouth at bedtime as needed. For sleep.  30 tablet  0   No current facility-administered medications for this visit.    SURGICAL HISTORY:  Past Surgical History  Procedure Laterality Date  . Abdominal hysterectomy    . Tubal ligation    . Tumor removal  08/06/08    from lungs  . Anterior cervical decomp/discectomy fusion  01/15/2011    Procedure: ANTERIOR CERVICAL DECOMPRESSION/DISCECTOMY FUSION 2 LEVELS;  Surgeon: Kathaleen Maser Pool;  Location: [facility];  Service: Neurosurgery;  Laterality: N/A;  Cervical Three-Four, Cervical Four-Five Anterior Cervical Decompression Fusion   . Colonscopy    . Nose surgery    . Laparoscopic appendectomy N/A 05/25/2012    Procedure: APPENDECTOMY LAPAROSCOPIC  CONVERTED TO  OPEN APPENDECTOMY, exploratory laparatomy;  Surgeon: Adolph Pollack, MD;  Location: [facility];  Service: General;  Laterality: N/A;  . Bowel resection N/A 05/25/2012    Procedure: SMALL BOWEL RESECTION;  Surgeon: Adolph Pollack, MD;  Location: [facility];  Service: General;  Laterality: N/A;  . Laparoscopic lysis of adhesions N/A 05/25/2012    Procedure:  LAPAROSCOPIC LYSIS OF ADHESIONS;  Surgeon: Adolph Pollack, MD;  Location: [facility];  Service: General;  Laterality: N/A;  . Appendectomy    . Cataract extraction, bilateral  09-01-12  . Debridement of abdominal wall abscess N/A 09/07/2012    Procedure:  ABDOMINAL WOUND IRRIGATION AND TWO LAYERED CLOSURE;  Surgeon: Adolph Pollack, MD;  Location: [facility];  Service: General;  Laterality: N/A;    REVIEW OF SYSTEMS:  Pertinent items are noted in HPI.   PHYSICAL EXAMINATION: General appearance: alert, cooperative and no distress Head: Normocephalic, without obvious abnormality, atraumatic Neck: no adenopathy Lymph nodes: Cervical, supraclavicular, and axillary nodes normal. Resp: clear to auscultation bilaterally Back: symmetric, no curvature. ROM normal. No CVA tenderness. Cardio: regular rate and rhythm, S1, S2 normal, no murmur, click, rub or gallop GI: Right lower quadrant tenderness. Dry, bulky bandage in place Extremities: extremities normal, atraumatic, no cyanosis or edema Neurologic: Alert and oriented X 3, normal strength and tone. Normal symmetric reflexes. Normal  coordination and gait  ECOG PERFORMANCE STATUS: 1 - Symptomatic but completely ambulatory  Blood pressure 123/49, pulse 65, temperature 98.2 F (36.8 C), temperature source Oral, resp. rate 20, height 5\' 2"  (1.575 m), weight 166 lb 14.4 oz (75.705 kg).  LABORATORY DATA: Lab Results  Component Value Date   WBC 4.8 09/20/2012   HGB 10.2* 09/20/2012   HCT 31.5* 09/20/2012   MCV 97.2 09/20/2012   PLT 181 09/20/2012      Chemistry      Component Value Date/Time   NA 140 09/20/2012 0919   NA 135 09/10/2012 0546   NA 147* 09/20/2011 0817   K 4.2 09/20/2012 0919   K 4.4 09/10/2012 0546   K 4.2 09/20/2011 0817   CL 97 09/10/2012 0546   CL 100 09/20/2011 0817   CO2 28 09/20/2012 0919   CO2 30 09/10/2012 0546   CO2 29 09/20/2011 0817   BUN 29.7* 09/20/2012 0919   BUN 25* 09/10/2012 0546   BUN 20 09/20/2011 0817   CREATININE 1.7* 09/20/2012 0919    CREATININE 1.60* 09/10/2012 0546   CREATININE 1.6* 09/20/2011 0817      Component Value Date/Time   CALCIUM 9.1 09/20/2012 0919   CALCIUM 9.4 09/10/2012 0546   CALCIUM 9.1 09/20/2011 0817   ALKPHOS 58 09/20/2012 0919   ALKPHOS 56 09/01/2012 1115   ALKPHOS 69 09/20/2011 0817   AST 8 09/20/2012 0919   AST 8 09/01/2012 1115   AST 17 09/20/2011 0817   ALT 10 09/20/2012 0919   ALT 6 09/01/2012 1115   ALT 23 09/20/2011 0817   BILITOT 0.30 09/20/2012 0919   BILITOT 0.2* 09/01/2012 1115   BILITOT 0.60 09/20/2011 0817       RADIOGRAPHIC STUDIES:  06/01/2012 CT of the chest with contrast revealed a small left pleural effusion, patchy areas of subpleural atelectasis and probable peripheral and basilar interstitial lung disease, no worrisome mass lesions or pulmonary nodules. Left upper lobe scarring changes are noted, and cardiac enlargement with dense coronary artery clacifications   ASSESSMENT/PLAN: This is a very pleasant 72 years old [place] American female with history of stage IA non-small cell lung cancer, squamous cell carcinoma, status post left upper lobectomy with lymph node dissection. The patient is doing fine and she has no evidence for disease recurrence on the recent scan. The patient was discussed with and seen by Dr. Arbutus Ped. THe results of the CT scan were discussed with the patient and her daughter. She will continue on observation. She will return in April on 2015 with a restaging CT scan of the chest without contrast, as she has now developed some renal insufficiency, to re-evaluate her disease.  Laural Benes, Morene Cecilio E, PA-C   The patient was advised to call me immediately if she has any concerning symptoms in the interval.  All questions were answered. The patient knows to call the clinic with any problems, questions or concerns. We can certainly see the patient much sooner if necessary.  ADDENDUM: Hematology/Oncology Attending: I have a face to face encounter with the patient. I recommended her  care plan. The patient has a history of stage IA non-small cell lung cancer status post left upper lobectomy with lymph node dissection and has been observation since June of 2000 and with no evidence for disease recurrence. I discussed the scan results with the patient and her daughter. I recommended for her to continue on observation with repeat CT scan of the chest in 1 year. She was advised to call  immediately if she has any concerning symptoms in the interval.  Lajuana Matte., MD 09/20/2012

## 2012-09-21 NOTE — Telephone Encounter (Signed)
Pt called requesting refill oxycodone. Pt states she cannot take hydrocodone. Pt states she is doing well but still very sore. No fever. Voiding well. BMs ok. Per Dr Abbey Chatters rx written by Dr Maisie Fus for oxycodone 500mg  #30 one po q 6 hr prn pain. Rx at front desk for pts daughter to pick up. Pt aware.

## 2012-09-21 NOTE — Telephone Encounter (Signed)
Seen and agree  

## 2012-09-22 NOTE — Telephone Encounter (Signed)
Dr Derrell Lolling wrote another prescription.  Percocet 5/325 po 1-2 q prn pain #30 no refills.  I await the daughter's arrival to drop off the other prescription.

## 2012-09-22 NOTE — Telephone Encounter (Signed)
The patient's daughter called and reports the Oxycodone prescription did not have a quantity written on it yesterday.  [facility] can't fill it.  I called [facility] [phone] and they confirmed it needs a quantity.  The pt must pick it up and bring it to [place] to correct and initial or rewrite a new prescription.  I advised the patient's daughter she must bring it to [place] by 5 pm today.

## 2012-09-26 ENCOUNTER — Encounter (INDEPENDENT_AMBULATORY_CARE_PROVIDER_SITE_OTHER): Payer: Self-pay | Admitting: General Surgery

## 2012-09-26 ENCOUNTER — Ambulatory Visit (INDEPENDENT_AMBULATORY_CARE_PROVIDER_SITE_OTHER): Payer: Medicare Other | Admitting: General Surgery

## 2012-09-26 VITALS — BP 132/60 | HR 64 | Temp 97.1°F | Resp 16 | Ht 62.0 in | Wt 164.4 lb

## 2012-09-26 DIAGNOSIS — Z9889 Other specified postprocedural states: Secondary | ICD-10-CM

## 2012-09-26 NOTE — Patient Instructions (Signed)
Change bandage twice a day please.

## 2012-09-26 NOTE — Progress Notes (Signed)
Procedure:  Irrigation and closure of nonhealing right lower quadrant wound  Date:  09/07/2012  Pathology: na  History:  She is here for her first postoperative visit. There's been a small amount of drainage from the wound. She's been needing home oxygen at times.  Exam: General- Is in NAD. Abdomen-sutures are present, there's some superficial separation of the skin in the medial area with little bit of nonpurulent drainage. The wound is moist.  Assessment:  Wound still not healed enough to remove sutures. The wound is moist.  Plan:  Change dressing on one twice a day. Continue activity restrictions. Return visit 2 weeks.

## 2012-10-03 ENCOUNTER — Ambulatory Visit: Payer: Medicare Other | Admitting: Internal Medicine

## 2012-10-04 ENCOUNTER — Ambulatory Visit (INDEPENDENT_AMBULATORY_CARE_PROVIDER_SITE_OTHER): Payer: Medicare Other | Admitting: Physician Assistant

## 2012-10-04 ENCOUNTER — Encounter: Payer: Self-pay | Admitting: Physician Assistant

## 2012-10-04 VITALS — BP 114/50 | HR 60 | Ht 62.0 in | Wt 162.1 lb

## 2012-10-04 DIAGNOSIS — I472 Ventricular tachycardia: Secondary | ICD-10-CM

## 2012-10-04 DIAGNOSIS — I509 Heart failure, unspecified: Secondary | ICD-10-CM

## 2012-10-04 DIAGNOSIS — E785 Hyperlipidemia, unspecified: Secondary | ICD-10-CM

## 2012-10-04 DIAGNOSIS — I5031 Acute diastolic (congestive) heart failure: Secondary | ICD-10-CM

## 2012-10-04 DIAGNOSIS — N189 Chronic kidney disease, unspecified: Secondary | ICD-10-CM

## 2012-10-04 DIAGNOSIS — I10 Essential (primary) hypertension: Secondary | ICD-10-CM

## 2012-10-04 NOTE — Assessment & Plan Note (Signed)
Creatinine 1.7 on 09/20/2012. Stable

## 2012-10-04 NOTE — Assessment & Plan Note (Signed)
Patient had recent trouble with acute on chronic diastolic heart failure while in the hospital for abdominal wound closure in July 2014. She was diuresed and 2-D echo showed normal LV function with grade 2 diastolic dysfunction mild AI and MR. Patient is stable today without evidence of heart failure. She is on a low-sodium diet and diuretics. She did have labs on August 6 that were stable so we'll not repeat. Continue same therapy. 2 g sodium diet.

## 2012-10-04 NOTE — Assessment & Plan Note (Signed)
Stable and followed by Dr. [name]

## 2012-10-04 NOTE — Patient Instructions (Addendum)
Your physician recommends that you schedule a follow-up appointment in: 4 MONTHS WITH DR. Clifton James    2 Gram Low Sodium Diet A 2 gram sodium diet restricts the amount of sodium in the diet to no more than 2 g or 2000 mg daily. Limiting the amount of sodium is often used to help lower blood pressure. It is important if you have heart, liver, or kidney problems. Many foods contain sodium for flavor and sometimes as a preservative. When the amount of sodium in a diet needs to be low, it is important to know what to look for when choosing foods and drinks. The following includes some information and guidelines to help make it easier for you to adapt to a low sodium diet. QUICK TIPS  Do not add salt to food.  Avoid convenience items and fast food.  Choose unsalted snack foods.  Buy lower sodium products, often labeled as "lower sodium" or "no salt added."  Check food labels to learn how much sodium is in 1 serving.  When eating at a restaurant, ask that your food be prepared with less salt or none, if possible. READING FOOD LABELS FOR SODIUM INFORMATION The nutrition facts label is a good place to find how much sodium is in foods. Look for products with no more than 500 to 600 mg of sodium per meal and no more than 150 mg per serving. Remember that 2 g = 2000 mg. The food label may also list foods as:  Sodium-free: Less than 5 mg in a serving.  Very low sodium: 35 mg or less in a serving.  Low-sodium: 140 mg or less in a serving.  Light in sodium: 50% less sodium in a serving. For example, if a food that usually has 300 mg of sodium is changed to become light in sodium, it will have 150 mg of sodium.  Reduced sodium: 25% less sodium in a serving. For example, if a food that usually has 400 mg of sodium is changed to reduced sodium, it will have 300 mg of sodium. CHOOSING FOODS Grains  Avoid: Salted crackers and snack items. Some cereals, including instant hot cereals. Bread stuffing  and biscuit mixes. Seasoned rice or pasta mixes.  Choose: Unsalted snack items. Low-sodium cereals, oats, puffed wheat and rice, shredded wheat. English muffins and bread. Pasta. Meats  Avoid: Salted, canned, smoked, spiced, pickled meats, including fish and poultry. Bacon, ham, sausage, cold cuts, hot dogs, anchovies.  Choose: Low-sodium canned tuna and salmon. Fresh or frozen meat, poultry, and fish. Dairy  Avoid: Processed cheese and spreads. Cottage cheese. Buttermilk and condensed milk. Regular cheese.  Choose: Milk. Low-sodium cottage cheese. Yogurt. Sour cream. Low-sodium cheese. Fruits and Vegetables  Avoid: Regular canned vegetables. Regular canned tomato sauce and paste. Frozen vegetables in sauces. Olives. Rosita Fire. Relishes. Sauerkraut.  Choose: Low-sodium canned vegetables. Low-sodium tomato sauce and paste. Frozen or fresh vegetables. Fresh and frozen fruit. Condiments  Avoid: Canned and packaged gravies. Worcestershire sauce. Tartar sauce. Barbecue sauce. Soy sauce. Steak sauce. Ketchup. Onion, garlic, and table salt. Meat flavorings and tenderizers.  Choose: Fresh and dried herbs and spices. Low-sodium varieties of mustard and ketchup. Lemon juice. Tabasco sauce. Horseradish. SAMPLE 2 GRAM SODIUM MEAL PLAN Breakfast / Sodium (mg)  1 cup low-fat milk / 143 mg  2 slices whole-wheat toast / 270 mg  1 tbs heart-healthy margarine / 153 mg  1 hard-boiled egg / 139 mg  1 small orange / 0 mg Lunch / Sodium (mg)  1  cup raw carrots / 76 mg   cup hummus / 298 mg  1 cup low-fat milk / 143 mg   cup red grapes / 2 mg  1 whole-wheat pita bread / 356 mg Dinner / Sodium (mg)  1 cup whole-wheat pasta / 2 mg  1 cup low-sodium tomato sauce / 73 mg  3 oz lean ground beef / 57 mg  1 small side salad (1 cup raw spinach leaves,  cup cucumber,  cup yellow bell pepper) with 1 tsp olive oil and 1 tsp red wine vinegar / 25 mg Snack / Sodium (mg)  1 container low-fat  vanilla yogurt / 107 mg  3 graham cracker squares / 127 mg Nutrient Analysis  Calories: 2033  Protein: 77 g  Carbohydrate: 282 g  Fat: 72 g  Sodium: 1971 mg Document Released: 02/01/2005 Document Revised: 04/26/2011 Document Reviewed: 05/05/2009 [employer] Patient Information 2014 [place], [place].

## 2012-10-04 NOTE — Progress Notes (Signed)
Primary care provider: [name] M.D. Cardiologist: Verne Carrow M.D.  HPI:  This is a 72 year old African American female patient with history of hypertension, hyperlipidemia, pericarditis, but no prior CAD. She was diagnosed with squamous cell lung carcinoma in 2010 and underwent surgery. Echo prior to surgery showed normal LV function with mild diastolic dysfunction and mild AI and mild MR. She does have a history of diastolic heart failure. She also had a 9 beat run of V. tach on an event recorder in 2013 and remains on beta blocker.   Patient had recent hospitalization for debridement and closure of a nonhealing right lower quadrant abdominal wound after a ruptured appendicitis in April 2014. She also had diastolic heart failure during this admission in July 2014. 2-D echo was repeated showing normal LV function, but grade 2 diastolic dysfunction with mild AI and mild MR.  Patient was sent home on oxygen and only uses it after she's been up moving around for a while. She has done well without significant dyspnea at rest, orthopnea, edema, chest pain, palpitations, dizziness or presyncope. She weighs herself daily and it has been stable.   Allergies: -- Amoxicillin -- Itching  -- Fentanyl -- Other (See Comments)   --  hallucinations  -- Hydrocodone -- Other (See Comments)   --  ineffective  -- Codeine -- Hives and Rash  -- Penicillins -- Hives and Rash  Current Outpatient Prescriptions on File Prior to Visit: albuterol (PROVENTIL HFA;VENTOLIN HFA) 108 (90 BASE) MCG/ACT inhaler, Inhale 1-2 puffs into the lungs 4 (four) times daily as needed for wheezing or shortness of breath. For wheezing or shortness of breath., Disp: , Rfl:  alendronate (FOSAMAX) 70 MG tablet, Take 1 tablet (70 mg total) by mouth every 7 (seven) days. Take with a full glass of water on an empty stomach. Patient takes on Mondays., Disp: 4 tablet, Rfl: 11 ALPRAZolam (XANAX) 0.5 MG tablet, Take 0.5 mg by mouth  daily as needed for anxiety., Disp: , Rfl:  amLODipine (NORVASC) 5 MG tablet, , Disp: , Rfl:  citalopram (CELEXA) 10 MG tablet, Take 10 mg by mouth every morning., Disp: , Rfl:  feeding supplement (ENSURE COMPLETE) LIQD, Take 237 mLs by mouth daily., Disp: , Rfl:  furosemide (LASIX) 40 MG tablet, Take 1 tablet (40 mg total) by mouth daily., Disp: 30 tablet, Rfl: 0 metoprolol succinate (TOPROL-XL) 25 MG 24 hr tablet, Take 1 tablet (25 mg total) by mouth every morning., Disp: 90 tablet, Rfl: 1 oxyCODONE (OXY IR/ROXICODONE) 5 MG immediate release tablet, Take 1 tablet (5 mg total) by mouth every 6 (six) hours as needed for pain., Disp: 30 tablet, Rfl: 0 pantoprazole (PROTONIX) 40 MG tablet, Take 40 mg by mouth daily as needed (for heartburn)., Disp: , Rfl:  polyethylene glycol (MIRALAX / GLYCOLAX) packet, Take 17 g by mouth daily., Disp: 14 each, Rfl: 0 pravastatin (PRAVACHOL) 40 MG tablet, Take 1 tablet (40 mg total) by mouth daily., Disp: 90 tablet, Rfl: 3 zolpidem (AMBIEN) 5 MG tablet, Take 1 tablet (5 mg total) by mouth at bedtime as needed. For sleep., Disp: 30 tablet, Rfl: 0  No current facility-administered medications on file prior to visit.   Past Medical History:   HYPERLIPIDEMIA                                  11/18/2006    HYPERKALEMIA  02/05/2008   ANEMIA-IRON DEFICIENCY                          09/22/2008     Anemia of other chronic disease                 02/05/2008   ANXIETY                                         06/03/2008    DEPRESSION                                      04/28/2009    HYPERTENSION                                    11/18/2006    PERICARDITIS                                    01/03/2007   AORTIC STENOSIS/ INSUFFICIENCY, NON-RHEUMATIC   12/03/2008   SINUSITIS- ACUTE-NOS                            11/20/2007    SINUSITIS, CHRONIC                              02/05/2008   ALLERGIC RHINITIS                               11/18/2006     PNEUMONIA                                       05/02/2007    C O P D                                         06/27/2008    Stricture and stenosis of esophagus             11/14/2008    GERD                                            01/03/2007   BARRETTS ESOPHAGUS                              11/14/2008    DYSPEPSIA                                       03/23/2007     PRURITUS  10/08/2008    OSTEOARTHRITIS                                  11/18/2006    OSTEOARTHRITIS, KNEE, LEFT                      01/03/2007   Cervicalgia                                     01/13/2009   BACK PAIN                                       02/04/2009     Comment:is improved   BURSITIS, LEFT HIP                              04/18/2009     OSTEOPOROSIS                                    11/18/2006    INSOMNIA-SLEEP DISORDER-UNSPEC                  06/03/2008    FATIGUE                                         04/18/2009     PERIPHERAL EDEMA                                02/13/2009   MURMUR                                          11/18/2006    DYSPHAGIA UNSPECIFIED                           03/23/2007     Abdominal pain, generalized                     03/01/2007    Nonspecific (abnormal) findings on radiologica* 06/03/2008    Tuberculin Test Reaction                        11/18/2006    Acute bronchitis                                02/23/2010     CHOLELITHIASIS                                  04/24/2010     NEPHROLITHIASIS, HX OF                          04/24/2010     Polyarthralgia  06/09/2010    Elevated sed rate                               06/11/2010    Positive ANA (antinuclear antibody)             06/11/2010    Rheumatoid factor positive                      06/11/2010    Cancer                                                         Comment:lung ca   CARCINOMA, LUNG, SQUAMOUS CELL                  06/23/2008     SPONDYLOSIS, CERVICAL, WITH RADICULOPATHY        01/13/2009   Cervical radiculopathy                          09/28/2010    Lumbar radiculopathy                            09/28/2010    RENAL INSUFFICIENCY                             02/05/2008     Comment:function has improved.   Transfusion history                             09-01-12        Comment:4'14 tranfused x 2 units   Bleeding nose                                   09-01-12        Comment:past hx. none recent after having surgery   Wound healing, delayed                          09-01-12        Comment:right abdominal area  Past Surgical History:   ABDOMINAL HYSTERECTOMY                                        TUBAL LIGATION                                                TUMOR REMOVAL                                    08/06/08        Comment:from lungs   ANTERIOR CERVICAL DECOMP/DISCECTOMY FUSION       01/15/2011     Comment:Procedure: ANTERIOR CERVICAL  DECOMPRESSION/DISCECTOMY FUSION 2 LEVELS;                Surgeon: Temple Pacini;  Location: [facility];              Service: Neurosurgery;  Laterality: N/A;                Cervical Three-Four, Cervical Four-Five               Anterior Cervical Decompression Fusion    colonscopy                                                    NOSE SURGERY                                                  LAPAROSCOPIC APPENDECTOMY                       N/A 05/25/2012      Comment:Procedure: APPENDECTOMY LAPAROSCOPIC  CONVERTED              TO  OPEN APPENDECTOMY, exploratory laparatomy;               Surgeon: Adolph Pollack, MD;  Location: [facility];  Service: General;  Laterality: N/A;   BOWEL RESECTION                                 N/A 05/25/2012      Comment:Procedure: SMALL BOWEL RESECTION;  Surgeon:               Adolph Pollack, MD;  Location: [facility];                Service: General;  Laterality: N/A;   LAPAROSCOPIC LYSIS OF ADHESIONS                 N/A 05/25/2012      Comment:Procedure: LAPAROSCOPIC  LYSIS OF ADHESIONS;                Surgeon: Adolph Pollack, MD;  Location: [facility]               ORS;  Service: General;  Laterality: N/A;   APPENDECTOMY                                                  CATARACT EXTRACTION, BILATERAL                   09-01-12      DEBRIDEMENT OF ABDOMINAL WALL ABSCESS           N/A 09/07/2012      Comment:Procedure:  ABDOMINAL WOUND IRRIGATION AND TWO               LAYERED CLOSURE;  Surgeon: Adolph Pollack,  MD;  Location: [facility];  Service: General;                Laterality: N/A;  Review of patient's family history indicates:   Hyperlipidemia                 Other                    Hypertension                   Other                    Coronary artery disease        Other                      Comment: several nephrew   Heart disease                  Father                   Colon cancer                   Neg Hx                   Esophageal cancer              Neg Hx                   Rectal cancer                  Neg Hx                   Stomach cancer                 Neg Hx                   Cancer                         Brother                    Comment: ?stomach   Social History   Marital Status: Widowed             Spouse Name:                      Years of Education:                 Number of children:             Occupational History   None on file  Social History Main Topics   Smoking Status: Former Smoker                   Packs/Day: 1.50  Years: 40        Types: Cigarettes     Quit date: 02/15/2006   Smokeless Status: Former Neurosurgeon                      Alcohol Use: No             Drug Use: No             Sexual Activity: Yes                    Birth Control/Protection:  Post-menopausal  Other Topics            Concern   None on file  Social History Narrative   None on file    ROS: See history of present illness otherwise negative   PHYSICAL EXAM: Well-nournished, in no acute distress. Neck: No JVD, HJR,  Bruit, or thyroid enlargement  Lungs: Decreased breath sounds but No tachypnea, clear without wheezing, rales, or rhonchi  Cardiovascular: RRR, PMI not displaced, 1/6 diastolic murmur at the left sternal border and 1/6 systolic murmur at the apex, no gallops, bruit, thrill, or heave.  Abdomen: BS normal. Soft without organomegaly, masses, lesions or tenderness.  Extremities: without cyanosis, clubbing or edema. Good distal pulses bilateral  SKin: Warm, no lesions or rashes   Musculoskeletal: No deformities  Neuro: no focal signs  BP 114/50  Pulse 60  Ht 5\' 2"  (1.575 m)  Wt 162 lb 1.9 oz (73.537 kg)  BMI 29.64 kg/m2      Echo 09/08/12: Study Conclusions  - Left ventricle: The cavity size was normal. Wall thickness   was normal. Systolic function was normal. The estimated   ejection fraction was in the range of 55% to 60%. Wall   motion was normal; there were no regional wall motion   abnormalities. Features are consistent with a pseudonormal   left ventricular filling pattern, with concomitant   abnormal relaxation and increased filling pressure (grade   2 diastolic dysfunction). - Aortic valve: Mild regurgitation. - Mitral valve: Mild regurgitation. - Left atrium: The atrium was mildly dilated. - Pulmonary arteries: PA peak pressure: 65mm Hg (S). Transthoracic echocardiography.  M-mode, complete 2D, spectral Doppler, and color Doppler.  Height:  Height: 157.5cm. Height: 62in.  Weight:  Weight: 75.3kg. Weight: 165.7lb.  Body mass index:  BMI: 30.4kg/m^2.  Body surface area:    BSA: 1.52m^2.  Blood pressure:     108/62.  Patient status:  Inpatient.  Location:  Bedside.

## 2012-10-04 NOTE — Assessment & Plan Note (Signed)
Well controlled 

## 2012-10-04 NOTE — Assessment & Plan Note (Signed)
No recurrent palpitations, on beta blocker

## 2012-10-05 NOTE — Discharge Summary (Signed)
Physician Discharge Summary  Patient ID: [name] MRN: [id number] DOB/AGE: 72-Dec-1942 72 y.o.  Admit date: 09/07/2012 Discharge date: 09/10/2012  Admission Diagnoses:  Non healing abdominal wound  Discharge Diagnoses:    Non healing abdominal wound   Acute diastolic congestive heart failure   CARCINOMA, LUNG, SQUAMOUS CELL   ANEMIA-IRON DEFICIENCY   C O P D   BARRETTS ESOPHAGUS   CKD (chronic kidney disease)    Discharged Condition: fair  Hospital Course: She was taken to the operating room on 09/07/12 for irrigation and closure of a nonhealing RLQ abdominal wound.  In the PACU she was noted to be hypoxic on room air.  CXR demonstrated CHF.  She was admitted and Internal Medicine consultation was obtained and diuresis initiated.  ECHO demonstrated a normal EF and diastolic dysfunction grade 2.  She continued to improve and was able to be discharged on 09/10/12.   Consults: Internal Medicine  Significant Diagnostic Studies:  ECHO  Treatments: surgery: Irrigation and closure of nonhealing abdominal wound 09/07/12  Discharge Exam: Blood pressure 105/55, pulse 62, temperature 98.7 F (37.1 C), temperature source Oral, resp. rate 18, height 5\' 2"  (1.575 m), weight 165 lb 5.5 oz (75 kg), SpO2 99.00%.   Disposition: 01-Home or Self Care  Discharge Orders   Future Appointments Provider Department Dept Phone   10/11/2012 10:00 AM Adolph Pollack, MD [facility] Surgery, [place] [phone]   11/20/2012 10:45 AM Corwin Levins, MD [facility] Primary Care -[place] [phone]   02/02/2013 10:30 AM Kathleene Hazel, MD [facility] Main Office [place]) [phone]   06/04/2013 10:00 AM [facility] MEDICAL ONCOLOGY [phone]   06/04/2013 10:30 AM [facility] [phone]   Patient to arrive 15 minutes prior to appointment time.   06/06/2013 10:00 AM Si Gaul, MD [facility]  MEDICAL ONCOLOGY [phone]   Future Orders Complete By Expires   Call MD for:  difficulty breathing, headache or visual disturbances  As directed    Call MD for:  persistant dizziness or light-headedness  As directed    Call MD for:  redness, tenderness, or signs of infection (pain, swelling, redness, odor or green/yellow discharge around incision site)  As directed    Call MD for:  severe uncontrolled pain  As directed    Call MD for:  temperature >100.4  As directed    Diet - low sodium heart healthy  As directed    Discharge instructions  As directed    Comments:     May cover wound with clean dry gauze daily May shower Call your primary care physician to follow up visit within 2-3 days to evaluated your medications. Call Dr. Abbey Chatters [phone] to schedule follow up in 7-10 days.   Increase activity slowly  As directed        Medication List    STOP taking these medications       amLODipine 5 MG tablet  Commonly known as:  NORVASC     HYDROcodone-acetaminophen 5-325 MG per tablet  Commonly known as:  NORCO      TAKE these medications       albuterol 108 (90 BASE) MCG/ACT inhaler  Commonly known as:  PROVENTIL HFA;VENTOLIN HFA  Inhale 1-2 puffs into the lungs 4 (four) times daily as needed for wheezing or shortness of breath. For wheezing or shortness of breath.     alendronate 70 MG tablet  Commonly known as:  FOSAMAX  Take 1 tablet (70 mg total) by mouth every 7 (seven) days. Take with a full glass of water on an empty stomach. Patient takes on Mondays.     ALPRAZolam 0.5 MG tablet  Commonly known as:  XANAX  Take 0.5 mg by mouth daily as needed for anxiety.     citalopram 10 MG tablet  Commonly known as:  CELEXA  Take 10 mg by mouth every morning.     feeding supplement Liqd  Take 237 mLs by mouth daily.     furosemide 40 MG tablet  Commonly known as:  LASIX  Take 1 tablet (40 mg total) by mouth daily.     pantoprazole 40 MG tablet  Commonly known as:   PROTONIX  Take 40 mg by mouth daily.     pravastatin 40 MG tablet  Commonly known as:  PRAVACHOL  Take 1 tablet (40 mg total) by mouth daily.     zolpidem 5 MG tablet  Commonly known as:  AMBIEN  Take 1 tablet (5 mg total) by mouth at bedtime as needed. For sleep.           Follow-up Information   Follow up with [name], MD In 4 days. (need Bmet to follow renal function. )    Specialties:  Internal Medicine, Radiology   Contact information:   [address] [place] [address] [phone]       Follow up with [facility] . (home health PT/RN/ home health aide)    Contact information:   [phone]      Signed: Adolph Pollack 10/05/2012, 9:50 AM

## 2012-10-06 ENCOUNTER — Telehealth (INDEPENDENT_AMBULATORY_CARE_PROVIDER_SITE_OTHER): Payer: Self-pay | Admitting: *Deleted

## 2012-10-06 NOTE — Telephone Encounter (Signed)
Called and spoke to [name] to update her on the below message.  She states understanding and agreeable with plan.  Patient has appt to see Dr. Abbey Chatters 10/11/12.

## 2012-10-06 NOTE — Telephone Encounter (Signed)
Vernona Rieger with [facility] called to let Dr. Abbey Chatters know that a portion of the patient's wound which he had sutured closed has come open.  She denies any change in drainage or signs of infection.  They are still doing the BID dressing changes.  Vernona Rieger wanted to check to see if anything needs to be done.  Explained a message will be sent to Dr. Abbey Chatters to ask him then we will call her back.  She states understanding and agreeable at this time.

## 2012-10-06 NOTE — Telephone Encounter (Signed)
Continue dry dressing to wound bid.

## 2012-10-11 ENCOUNTER — Encounter (INDEPENDENT_AMBULATORY_CARE_PROVIDER_SITE_OTHER): Payer: Self-pay | Admitting: General Surgery

## 2012-10-11 ENCOUNTER — Ambulatory Visit (INDEPENDENT_AMBULATORY_CARE_PROVIDER_SITE_OTHER): Payer: Medicare Other | Admitting: General Surgery

## 2012-10-11 VITALS — BP 110/68 | HR 78 | Resp 14 | Ht 62.0 in | Wt 160.8 lb

## 2012-10-11 DIAGNOSIS — Z9889 Other specified postprocedural states: Secondary | ICD-10-CM

## 2012-10-11 NOTE — Patient Instructions (Signed)
Restart saline damp to dry dressing changes twice a day. Take vitamin A, vitamin C, and zinc supplements as discussed. Increase protein in diet.

## 2012-10-11 NOTE — Progress Notes (Signed)
Procedure:  Irrigation and closure of nonhealing right lower quadrant wound  Date:  09/07/2012  Pathology: na  History:  She is here for another postoperative visit.  The wound is separating again. She still having some discomfort from the area. Exam: General- Is in NAD. Abdomen-sutures are separated and the wound is open again  Assessment:  Wound closure did not hold and wound is open again as before.  Plan:  Restart normal saline damp to dry dressing changes twice a day. Increase protein and diet. Start taking vitamin A, vitamin C, and zinc supplements. I discussed with them that this could take a potentially long time to heal or it may never healed. We also discussed the possibility of negative pressure wound therapy sometime in the future. Return visit 3 weeks.

## 2012-10-20 ENCOUNTER — Ambulatory Visit: Payer: Medicare Other

## 2012-10-23 ENCOUNTER — Telehealth (INDEPENDENT_AMBULATORY_CARE_PROVIDER_SITE_OTHER): Payer: Self-pay | Admitting: *Deleted

## 2012-10-23 NOTE — Telephone Encounter (Signed)
They can try it.

## 2012-10-23 NOTE — Telephone Encounter (Signed)
[name] with [facility] called to ask if they can switch the dressing's to hydrogel rather than the NS.  She states she feels this would improve healing of the wound.  Explained that a message will be sent to Dr. Abbey Chatters to ask for approval and then we will let her know.  [name] states understanding and agreeable at this time.

## 2012-10-24 NOTE — Telephone Encounter (Signed)
Left another message for Vernona Rieger on the phone number she gave but still no call back.  I called and spoke to Huntley Dec at the [employer] office and gave her the new order to change from NS dressings to the Hydrogel.  She states she will pass it on to Vernona Rieger and make sure this gets changed.

## 2012-10-24 NOTE — Telephone Encounter (Signed)
Called and left message for [name] to give me a call back to update her that Dr. Abbey Chatters ok'd to try the change in dressing changes.  Awaiting her call back at this time.

## 2012-11-03 ENCOUNTER — Ambulatory Visit (INDEPENDENT_AMBULATORY_CARE_PROVIDER_SITE_OTHER): Payer: Medicare Other | Admitting: General Surgery

## 2012-11-03 ENCOUNTER — Encounter (INDEPENDENT_AMBULATORY_CARE_PROVIDER_SITE_OTHER): Payer: Self-pay | Admitting: General Surgery

## 2012-11-03 VITALS — BP 132/72 | HR 68 | Resp 16 | Ht 62.0 in | Wt 164.4 lb

## 2012-11-03 DIAGNOSIS — Z9889 Other specified postprocedural states: Secondary | ICD-10-CM

## 2012-11-03 NOTE — Progress Notes (Signed)
She is here for another postoperative visit in the wound check. She's been taking the vitamins and protein shakes. She has been switched over to hydrogel.  On exam the right lower quadrant wound is getting smaller and looks clean.  Impression- right lower quadrant wound status post attempted reclosure-part of it opened up it;  it's closing now by secondary intention.  Plan: Continue current treatment. Return visit 3 weeks.

## 2012-11-03 NOTE — Patient Instructions (Signed)
Continue the vitamins, protein shakes, and current wound care.

## 2012-11-06 ENCOUNTER — Encounter (HOSPITAL_BASED_OUTPATIENT_CLINIC_OR_DEPARTMENT_OTHER): Payer: Medicare Other

## 2012-11-07 DIAGNOSIS — J449 Chronic obstructive pulmonary disease, unspecified: Secondary | ICD-10-CM

## 2012-11-07 DIAGNOSIS — I10 Essential (primary) hypertension: Secondary | ICD-10-CM

## 2012-11-07 DIAGNOSIS — I509 Heart failure, unspecified: Secondary | ICD-10-CM

## 2012-11-07 DIAGNOSIS — S31109A Unspecified open wound of abdominal wall, unspecified quadrant without penetration into peritoneal cavity, initial encounter: Secondary | ICD-10-CM

## 2012-11-07 DIAGNOSIS — K3580 Unspecified acute appendicitis: Secondary | ICD-10-CM

## 2012-11-14 ENCOUNTER — Other Ambulatory Visit: Payer: Self-pay | Admitting: Internal Medicine

## 2012-11-20 ENCOUNTER — Ambulatory Visit: Payer: Medicare Other | Admitting: Internal Medicine

## 2012-11-21 ENCOUNTER — Ambulatory Visit (INDEPENDENT_AMBULATORY_CARE_PROVIDER_SITE_OTHER): Payer: Medicare Other | Admitting: Family Medicine

## 2012-11-21 ENCOUNTER — Encounter: Payer: Self-pay | Admitting: Internal Medicine

## 2012-11-21 ENCOUNTER — Encounter: Payer: Self-pay | Admitting: Family Medicine

## 2012-11-21 ENCOUNTER — Ambulatory Visit (INDEPENDENT_AMBULATORY_CARE_PROVIDER_SITE_OTHER): Payer: Medicare Other | Admitting: Internal Medicine

## 2012-11-21 VITALS — BP 152/72 | HR 71 | Ht 62.0 in | Wt 162.0 lb

## 2012-11-21 VITALS — BP 152/72 | HR 71 | Temp 97.6°F | Ht 62.0 in | Wt 162.0 lb

## 2012-11-21 DIAGNOSIS — M25519 Pain in unspecified shoulder: Secondary | ICD-10-CM

## 2012-11-21 DIAGNOSIS — M12519 Traumatic arthropathy, unspecified shoulder: Secondary | ICD-10-CM

## 2012-11-21 DIAGNOSIS — Z0000 Encounter for general adult medical examination without abnormal findings: Secondary | ICD-10-CM

## 2012-11-21 DIAGNOSIS — M25511 Pain in right shoulder: Secondary | ICD-10-CM | POA: Insufficient documentation

## 2012-11-21 DIAGNOSIS — Z23 Encounter for immunization: Secondary | ICD-10-CM

## 2012-11-21 DIAGNOSIS — M12811 Other specific arthropathies, not elsewhere classified, right shoulder: Secondary | ICD-10-CM | POA: Insufficient documentation

## 2012-11-21 MED ORDER — HYDROCODONE-ACETAMINOPHEN 5-325 MG PO TABS: 1.0000 | ORAL_TABLET | Freq: Four times a day (QID) | ORAL | Status: DC | PRN

## 2012-11-21 MED ORDER — AMLODIPINE BESYLATE 5 MG PO TABS: 5.0000 mg | ORAL_TABLET | Freq: Every day | ORAL | Status: DC

## 2012-11-21 MED ORDER — CITALOPRAM HYDROBROMIDE 10 MG PO TABS: 10.0000 mg | ORAL_TABLET | Freq: Every morning | ORAL | Status: DC

## 2012-11-21 NOTE — Assessment & Plan Note (Signed)
To see sports med today for eval

## 2012-11-21 NOTE — Assessment & Plan Note (Signed)

## 2012-11-21 NOTE — Patient Instructions (Addendum)
Very nice to meet you You do have a rotator cuff tear.  Please show your therapist this packet and they help you with exercises.   Glucosamine sulfate 750mg  twice a day is a supplement that has been shown to help moderate to severe arthritis. Capsaicin topically up to four times a day may also help with pain. Fish oil 3 grams daily Vitamin D 1000IU daily It's important that you continue to stay active. Heat or ice 20 minutes at a time 3-4 times a day as needed to help with pain. Do the exercises daily. me back and see me in 3 weeks. If stil lin pain can do injection.

## 2012-11-21 NOTE — Assessment & Plan Note (Signed)
Discussed at great length with the patient at this time. Patient's caregiver was with her as well. We discussed the diagnosis, prognosis and potential treatment options. At this time patient would like to try conservative therapy. Patient does have significant other comorbidities which makes her a very attractive surgical candidate. Looking under ultrasound as well this seems to be somewhat of a chronic tear in with the degree of arthropathy I see this may be an inoperable. Patient would be looking at a shoulder replacement surgery which patient would not like to do if possible. We discussed potentially doing symptomatic intra-articular injections which he declined today. Patient will continue the pain medications given to her by her primary care provider and given other over-the-counter medications and we'll not interfere with her prescription medications. Discussed icing and heating protocol Patient will do home exercise program and continue to work with her home physical therapist. Patient will come back again in 3 weeks. She still having pain at that time I will do an intra-articular steroid injection for symptomatic relief.

## 2012-11-21 NOTE — Progress Notes (Signed)
Subjective:    Patient ID: [name], female    DOB: 02-Apr-1940, 72 y.o.   MRN: [id number]  HPI  Here for wellness and f/u;  Overall doing ok;  Pt denies CP, worsening SOB, DOE, wheezing, orthopnea, PND, worsening LE edema, palpitations, dizziness or syncope.  Pt denies neurological change such as new headache, facial or extremity weakness.  Pt denies polydipsia, polyuria, or low sugar symptoms. Pt states overall good compliance with treatment and medications, good tolerability, and has been trying to follow lower cholesterol diet.  Pt denies worsening depressive symptoms, suicidal ideation or panic. No fever, night sweats, wt loss, loss of appetite, or other constitutional symptoms.  Pt states good ability with ADL's, has low fall risk, home safety reviewed and adequate, no other significant changes in hearing or vision, and only occasionally active with exercise.  Right abd wound finally now near healed.  Aug labs with stable CKD and mild anemia.  C/o aches and pain to arms and knees/legs, asks for pain med refill. Hx of 3 wks of increased bilat shoulder pain and decreased ROM.  No prior shoulder surgury.  Pain today 8/10 this am, not able to reduce narcotic use with wound healing. Past Medical History  Diagnosis Date  . HYPERLIPIDEMIA 11/18/2006  . HYPERKALEMIA 02/05/2008  . ANEMIA-IRON DEFICIENCY 09/22/2008  . Anemia of other chronic disease 02/05/2008  . ANXIETY 06/03/2008  . DEPRESSION 04/28/2009  . HYPERTENSION 11/18/2006  . PERICARDITIS 01/03/2007  . AORTIC STENOSIS/ INSUFFICIENCY, NON-RHEUMATIC 12/03/2008  . SINUSITIS- ACUTE-NOS 11/20/2007  . SINUSITIS, CHRONIC 02/05/2008  . ALLERGIC RHINITIS 11/18/2006  . PNEUMONIA 05/02/2007  . C O P D 06/27/2008  . Stricture and stenosis of esophagus 11/14/2008  . GERD 01/03/2007  . BARRETTS ESOPHAGUS 11/14/2008  . DYSPEPSIA 03/23/2007  . PRURITUS 10/08/2008  . OSTEOARTHRITIS 11/18/2006  . OSTEOARTHRITIS, KNEE, LEFT 01/03/2007  . Cervicalgia 01/13/2009   . BACK PAIN 02/04/2009    is improved  . BURSITIS, LEFT HIP 04/18/2009  . OSTEOPOROSIS 11/18/2006  . INSOMNIA-SLEEP DISORDER-UNSPEC 06/03/2008  . FATIGUE 04/18/2009  . PERIPHERAL EDEMA 02/13/2009  . MURMUR 11/18/2006  . DYSPHAGIA UNSPECIFIED 03/23/2007  . Abdominal pain, generalized 03/01/2007  . Nonspecific (abnormal) findings on radiological and other examination of body structure 06/03/2008  . Tuberculin Test Reaction 11/18/2006  . Acute bronchitis 02/23/2010  . CHOLELITHIASIS 04/24/2010  . NEPHROLITHIASIS, HX OF 04/24/2010  . Polyarthralgia 06/09/2010  . Elevated sed rate 06/11/2010  . Positive ANA (antinuclear antibody) 06/11/2010  . Rheumatoid factor positive 06/11/2010  . Cancer     lung ca  . 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 SPONDYLOSIS, CERVICAL, WITH RADICULOPATHY 01/13/2009  . Cervical radiculopathy 09/28/2010  . Lumbar radiculopathy 09/28/2010  . RENAL INSUFFICIENCY 02/05/2008    function has improved.  . Transfusion history 09-01-12    4'14 tranfused x 2 units  . Bleeding nose 09-01-12    past hx. none recent after having surgery  . Wound healing, delayed 09-01-12    right abdominal area   Past Surgical History  Procedure Laterality Date  . Abdominal hysterectomy    . Tubal ligation    . Tumor removal  08/06/08    from lungs  . Anterior cervical decomp/discectomy fusion  01/15/2011    Procedure: ANTERIOR CERVICAL DECOMPRESSION/DISCECTOMY FUSION 2 LEVELS;  Surgeon: Kathaleen Maser Pool;  Location: [facility];  Service: Neurosurgery;  Laterality: N/A;  Cervical Three-Four, Cervical Four-Five Anterior Cervical Decompression Fusion   . Colonscopy    . Nose surgery    .  Laparoscopic appendectomy N/A 05/25/2012    Procedure: APPENDECTOMY LAPAROSCOPIC  CONVERTED TO  OPEN APPENDECTOMY, exploratory laparatomy;  Surgeon: Adolph Pollack, MD;  Location: [facility];  Service: General;  Laterality: N/A;  . Bowel resection N/A 05/25/2012    Procedure: SMALL BOWEL RESECTION;  Surgeon: Adolph Pollack, MD;  Location: [facility];  Service: General;  Laterality: N/A;  . Laparoscopic lysis of adhesions N/A 05/25/2012    Procedure: LAPAROSCOPIC LYSIS OF ADHESIONS;  Surgeon: Adolph Pollack, MD;  Location: [facility];  Service: General;  Laterality: N/A;  . Appendectomy    . Cataract extraction, bilateral  09-01-12  . Debridement of abdominal wall abscess N/A 09/07/2012    Procedure:  ABDOMINAL WOUND IRRIGATION AND TWO LAYERED CLOSURE;  Surgeon: Adolph Pollack, MD;  Location: [facility];  Service: General;  Laterality: N/A;    reports that she quit smoking about 6 years ago. Her smoking use included Cigarettes. She has a 60 pack-year smoking history. She has quit using smokeless tobacco. She reports that she does not drink alcohol or use illicit drugs. family history includes Cancer in her brother; Coronary artery disease in her other; Heart disease in her father; Hyperlipidemia in her other; Hypertension in her other. There is no history of Colon cancer, Esophageal cancer, Rectal cancer, or Stomach cancer. Allergies  Allergen Reactions  . Amoxicillin Itching  . Fentanyl Other (See Comments)    hallucinations  . Hydrocodone Other (See Comments)    ineffective  . Codeine Hives and Rash  . Penicillins Hives and Rash   Current Outpatient Prescriptions on File Prior to Visit  Medication Sig Dispense Refill  . albuterol (PROVENTIL HFA;VENTOLIN HFA) 108 (90 BASE) MCG/ACT inhaler Inhale 1-2 puffs into the lungs 4 (four) times daily as needed for wheezing or shortness of breath. For wheezing or shortness of breath.      Marland Kitchen alendronate (FOSAMAX) 70 MG tablet Take 1 tablet (70 mg total) by mouth every 7 (seven) days. Take with a full glass of water on an empty stomach. Patient takes on Mondays.  4 tablet  11  . alendronate (FOSAMAX) 70 MG tablet TAKE 1 TABLET (70 MG TOTAL) BY MOUTH EVERY 7 (SEVEN) DAYS. TAKE WITH A FULL GLASS OF WATER ON AN EMPTY STOMACH.  4 tablet  11  . ALPRAZolam (XANAX) 0.5 MG  tablet Take 0.5 mg by mouth daily as needed for anxiety.      . feeding supplement (ENSURE COMPLETE) LIQD Take 237 mLs by mouth daily.      . furosemide (LASIX) 40 MG tablet Take 1 tablet (40 mg total) by mouth daily.  30 tablet  0  . metoprolol succinate (TOPROL-XL) 25 MG 24 hr tablet Take 1 tablet (25 mg total) by mouth every morning.  90 tablet  1  . pantoprazole (PROTONIX) 40 MG tablet Take 40 mg by mouth daily.       . pravastatin (PRAVACHOL) 40 MG tablet TAKE 1 TABLET BY MOUTH EVERY DAY  90 tablet  3  . zolpidem (AMBIEN) 5 MG tablet Take 1 tablet (5 mg total) by mouth at bedtime as needed. For sleep.  30 tablet  0   No current facility-administered medications on file prior to visit.    Review of Systems Constitutional: Negative for diaphoresis, activity change, appetite change or unexpected weight change.  HENT: Negative for hearing loss, ear pain, facial swelling, mouth sores and neck stiffness.   Eyes: Negative for pain, redness and visual disturbance.  Respiratory: Negative  for shortness of breath and wheezing.   Cardiovascular: Negative for chest pain and palpitations.  Gastrointestinal: Negative for diarrhea, blood in stool, abdominal distention or other pain Genitourinary: Negative for hematuria, flank pain or change in urine volume.  Musculoskeletal: Negative for myalgias and joint swelling.  Skin: Negative for color change and wound.  Neurological: Negative for syncope and numbness. other than noted Hematological: Negative for adenopathy.  Psychiatric/Behavioral: Negative for hallucinations, self-injury, decreased concentration and agitation.      Objective:   Physical Exam BP 152/72  Pulse 71  Temp(Src) 97.6 F (36.4 C) (Oral)  Ht 5\' 2"  (1.575 m)  Wt 162 lb (73.483 kg)  BMI 29.62 kg/m2  SpO2 94% VS noted, not ill appearing Constitutional: Pt is oriented to person, place, and time. Appears well-developed and well-nourished.  Head: Normocephalic and atraumatic.   Right Ear: External ear normal.  Left Ear: External ear normal.  Nose: Nose normal.  Mouth/Throat: Oropharynx is clear and moist.  Eyes: Conjunctivae and EOM are normal. Pupils are equal, round, and reactive to light.  Neck: Normal range of motion. Neck supple. No JVD present. No tracheal deviation present.  Cardiovascular: Normal rate, regular rhythm, normal heart sounds and intact distal pulses.   Pulmonary/Chest: Effort normal and breath sounds normal.  Abdominal: Soft. Bowel sounds are normal. There is no tenderness. No HSM  Musculoskeletal: Normal range of motion. Exhibits no edema.  Lymphadenopathy:  Has no cervical adenopathy.  Neurological: Pt is alert and oriented to person, place, and time. Pt has normal reflexes. No cranial nerve deficit.  Skin: Skin is warm and dry. No rash noted.  Psychiatric:  Behavior is normal. Bilat shoulders with pain on active ROM, limited to 80 degress on right abduction, 90 degrees on left     Assessment & Plan:

## 2012-11-21 NOTE — Progress Notes (Signed)
I'm seeing this patient by the request  of:  Dr. Jonny Ruiz CC: Bilateral shoulder pain right greater than left  HPI: Patient is a 72 year old female with significant comorbidities and multiple medical problems coming in with bilateral shoulder pain right greater than left. Patient states that it seems to be worsening over the course of the last 3 weeks but has had it for probably years. Patient does notice she's had some or weakness in dull aching sensation in her right shoulder mostly. Patient states that this does seem to be radiating down her arm somewhat. Patient denies any weakness of the hand but states that certain activity such as dressing has become very difficult. Patient does state that wake him up at night secondary to pain has started to be a regular routine. Patient has tried pain medications and was given to her by her primary care provider which does seem to help mildly. Patient also says he to hurt helps. Patient feels that this last hospitalization has made it very difficult and possibly worsen. Patient notes that severity of the pain 8/10 in the right shoulder and about a 3/10 on the left shoulder.   Past medical, surgical, family and social history reviewed. Medications reviewed all in the electronic medical record.   Review of Systems: No headache, visual changes, nausea, vomiting, diarrhea, constipation, dizziness, abdominal pain, skin rash, fevers, chills, night sweats, weight loss, swollen lymph nodes, body aches, joint swelling, muscle aches, chest pain, shortness of breath, mood changes.   Objective:    Blood pressure 152/72, pulse 71, height 5\' 2"  (1.575 m), weight 162 lb (73.483 kg), SpO2 94.00%.   General: No apparent distress alert and oriented x3 mood and affect normal, dressed appropriately. Patient does have some increased kyphosis HEENT: Pupils equal, extraocular movements intact Respiratory: Patient's speak in full sentences and does not appear short of  breath Cardiovascular: No lower extremity edema, non tender, no erythema Skin: Warm dry intact with no signs of infection or rash on extremities or on axial skeleton. Abdomen: Soft nontender Neuro: Cranial nerves II through XII are intact, neurovascularly intact in all extremities with 2+ DTRs and 2+ pulses. Lymph: No lymphadenopathy of posterior or anterior cervical chain or axillae bilaterally.  Gait patient does walk with a cane but does have good balance  MSK: Non tender with full range of motion and good stability and symmetric strength and tone of, elbows, wrist, hip, knee and ankles bilaterally.  Shoulder: Right Inspection reveals no abnormalities, atrophy or asymmetry. Palpation is normal with no tenderness over AC joint or bicipital groove. Patient is only able to do active forward flexion to 80, abduction to 70, internal rotation to hip. Contralateral side has full range of motion The patient's rotator cuff strength is 3/5 with positive drop arm sign. Patient has 5 out of 5 strength of the contralateral side Positive painful arc Positive empty can Speeds and Yergason's tests normal. No labral pathology noted with negative Obrien's, negative clunk and good stability. Normal scapular function observed.  MSK US performed of: Right show This study was ordered, performed, and interpreted by Terrilee Files D.O.  Shoulder:   Supraspinatus:  Patient does have a full-thickness tear of the supraspinatus that is approximately 2 cm retracted. There is some mild atrophy of the muscle itself as well as with some fatty deposits. This usually symbolize as this was more of a long-term problem.  Infraspinatus:  Appears normal on long and transverse views. Does have some mild fatty deposits. Subscapularis:  Appears  normal on long and transverse views. Mild hypoechoic changes but no tear appreciated Teres Minor:  Appears normal on long and transverse views. AC joint:  Capsule undistended, no geyser  sign. Glenohumeral Joint:  Moderate osteoarthritis Glenoid Labrum:  Intact without visualized tears. Biceps Tendon:  Appears normal on long and transverse views, no fraying of tendon, tendon located in intertubercular groove, no subluxation with shoulder internal or external rotation. No increased power doppler signal.  Impression: Full-thickness rotator cuff tear with 2 cm retraction of the supraspinatus.     Impression and Recommendations:     This case required medical decision making of moderate complexity.

## 2012-11-21 NOTE — Patient Instructions (Addendum)
Please call your insurance to see if the Shingles shot is covered, if so please for Nurse visit for the shot You had the Prevnar shot today Please continue all other medications as before, and refills have been done if requested, including the hydrocodone No further lab work needed today Please keep your appointments with your specialists as you have planned - oncology You will be contacted regarding the referral for: to see Dr Smith/sports medicine today  Please remember to sign up for My Chart if you have not done so, as this will be important to you in the future with finding out test results, communicating by private email, and scheduling acute appointments online when needed.  Please return in 6 months, or sooner if needed

## 2012-11-29 ENCOUNTER — Encounter (INDEPENDENT_AMBULATORY_CARE_PROVIDER_SITE_OTHER): Payer: Self-pay | Admitting: General Surgery

## 2012-11-29 ENCOUNTER — Ambulatory Visit (INDEPENDENT_AMBULATORY_CARE_PROVIDER_SITE_OTHER): Payer: Medicare Other | Admitting: General Surgery

## 2012-11-29 VITALS — BP 122/64 | HR 68 | Temp 98.0°F | Resp 14 | Ht 62.0 in | Wt 162.8 lb

## 2012-11-29 DIAGNOSIS — Z9889 Other specified postprocedural states: Secondary | ICD-10-CM

## 2012-11-29 NOTE — Patient Instructions (Signed)
Continue current wound care

## 2012-11-29 NOTE — Progress Notes (Signed)
She is here for another postoperative visit for a slowly healing right lower quadrant abdominal wound that failed an attempt at secondary closure. She's been taking the vitamins and protein shakes. She has been switched over to hydrogel and continues with this.  On exam the right lower quadrant wound is significantly smaller and looks clean.  Impression- right lower quadrant wound status post attempted reclosure-part of it opened up it;  it's closing now by secondary intention.  Plan: Continue current treatment. Return visit one month.

## 2012-11-30 ENCOUNTER — Ambulatory Visit
Admission: RE | Admit: 2012-11-30 | Discharge: 2012-11-30 | Disposition: A | Discharge status: Home / Self Care | Payer: Medicare Other | Source: Ambulatory Visit

## 2012-11-30 DIAGNOSIS — Z1231 Encounter for screening mammogram for malignant neoplasm of breast: Secondary | ICD-10-CM

## 2012-11-30 LAB — HM MAMMOGRAPHY

## 2012-12-12 ENCOUNTER — Encounter: Payer: Self-pay | Admitting: Family Medicine

## 2012-12-12 ENCOUNTER — Ambulatory Visit (INDEPENDENT_AMBULATORY_CARE_PROVIDER_SITE_OTHER): Payer: Medicare Other | Admitting: Family Medicine

## 2012-12-12 ENCOUNTER — Telehealth: Payer: Self-pay | Admitting: Internal Medicine

## 2012-12-12 ENCOUNTER — Encounter: Payer: Self-pay | Admitting: *Deleted

## 2012-12-12 ENCOUNTER — Other Ambulatory Visit (INDEPENDENT_AMBULATORY_CARE_PROVIDER_SITE_OTHER): Payer: Medicare Other

## 2012-12-12 VITALS — BP 110/60 | HR 62

## 2012-12-12 DIAGNOSIS — M25519 Pain in unspecified shoulder: Secondary | ICD-10-CM

## 2012-12-12 DIAGNOSIS — M12519 Traumatic arthropathy, unspecified shoulder: Secondary | ICD-10-CM

## 2012-12-12 DIAGNOSIS — M25511 Pain in right shoulder: Secondary | ICD-10-CM

## 2012-12-12 DIAGNOSIS — M12811 Other specific arthropathies, not elsewhere classified, right shoulder: Secondary | ICD-10-CM

## 2012-12-12 MED ORDER — ZOLPIDEM TARTRATE 5 MG PO TABS: 5.0000 mg | ORAL_TABLET | Freq: Every evening | ORAL | Status: DC | PRN

## 2012-12-12 NOTE — Assessment & Plan Note (Signed)
Again discussed with patient with her comorbidities she is not a surgical candidate. Patient did have a injection today and did have significant decrease in pain immediately. Patient given other home exercises and will continue formal physical therapy for another 6 week interval. Discussed again how other medications such as vitamin D and fish oil can be beneficial for her arthritis pain. Patient and will follow up again in 6 weeks.

## 2012-12-12 NOTE — Progress Notes (Signed)
  Followup right  shoulder pain  HPI: Patient is a 72 year old female with significant comorbidities and multiple medical problems coming in with followup of right shoulder pain. Patient does have left shoulder pain as well but the right one seems to be worse. Patient has been doing formal physical therapy, taking her regular medications with some pain medications from time to time, and she states she is doing minimally better. Patient is still having enough pain that it is waking her up at night and finds it very difficult to sleep. Patient is still unable to raise her right hand above her head to do her hair. Patient is also having a constant pain it does not seem to go away. Patient is not doing any of the over-the-counter medications that we discussed at last visit for her arthritis pain .  Past medical, surgical, family and social history reviewed. Medications reviewed all in the electronic medical record.   Review of Systems: No headache, visual changes, nausea, vomiting, diarrhea, constipation, dizziness, abdominal pain, skin rash, fevers, chills, night sweats, weight loss, swollen lymph nodes, body aches, joint swelling, muscle aches, chest pain, shortness of breath, mood changes.   Objective:    Blood pressure 110/60, pulse 62, SpO2 92.00%.   General: No apparent distress alert and oriented x3 mood and affect normal, dressed appropriately. Patient does have some increased kyphosis HEENT: Pupils equal, extraocular movements intact Respiratory: Patient's speak in full sentences and does not appear short of breath Cardiovascular: No lower extremity edema, non tender, no erythema Skin: Warm dry intact with no signs of infection or rash on extremities or on axial skeleton. Abdomen: Soft nontender Neuro: Cranial nerves II through XII are intact, neurovascularly intact in all extremities with 2+ DTRs and 2+ pulses. Lymph: No lymphadenopathy of posterior or anterior cervical chain or axillae  bilaterally.  Gait patient does walk with a cane but does have good balance  MSK: Non tender with full range of motion and good stability and symmetric strength and tone of, elbows, wrist, hip, knee and ankles bilaterally.  Shoulder: Right Inspection reveals no abnormalities, atrophy or asymmetry. Palpation is normal with no tenderness over AC joint or bicipital groove. Patient is only able to do active forward flexion to 90, abduction to 80, internal rotation to hip. Contralateral side has full range of motion The patient's rotator cuff strength is 4/5 with positive drop arm sign. Patient has 5 out of 5 strength of the contralateral side Positive painful arc Positive empty can Speeds and Yergason's tests normal. No labral pathology noted with negative Obrien's, negative clunk and good stability. Normal scapular function observed.  After informed written and verbal consent, patient was seated on exam table. Right shoulder was prepped with alcohol swab and utilizing posterior approach, patient's right glenohumeral space was injected with 2:2:1  Lidocaine 1% :marcaine 0.5%: Kenalog 40mg /dL. Patient tolerated the procedure well without immediate complications.   Impression and Recommendations:     This case required medical decision making of moderate complexity.

## 2012-12-12 NOTE — Telephone Encounter (Signed)
Pt request refill for Ambien. Please call pt if this is ok. Pt is out of this med.

## 2012-12-12 NOTE — Telephone Encounter (Signed)
Done hardcopy to robin  

## 2012-12-12 NOTE — Patient Instructions (Signed)
I gave you an injection today.  We can repeat it every 3 months if needed.  We will continue physical therapy because you are getting stronger.  We will give you a note.  Come back again in 6 weeks.

## 2012-12-13 NOTE — Telephone Encounter (Signed)
Called the patient to informed prescription has been faxed to [facility] [address] [place]

## 2012-12-25 ENCOUNTER — Ambulatory Visit (INDEPENDENT_AMBULATORY_CARE_PROVIDER_SITE_OTHER): Payer: Medicare Other | Admitting: General Surgery

## 2012-12-25 ENCOUNTER — Encounter (INDEPENDENT_AMBULATORY_CARE_PROVIDER_SITE_OTHER): Payer: Self-pay | Admitting: General Surgery

## 2012-12-25 VITALS — BP 140/72 | HR 66 | Temp 97.5°F | Resp 14 | Ht 62.0 in | Wt 163.6 lb

## 2012-12-25 DIAGNOSIS — S31109D Unspecified open wound of abdominal wall, unspecified quadrant without penetration into peritoneal cavity, subsequent encounter: Secondary | ICD-10-CM

## 2012-12-25 DIAGNOSIS — Z5189 Encounter for other specified aftercare: Secondary | ICD-10-CM

## 2012-12-25 NOTE — Patient Instructions (Signed)
Keep applying a daily dry dressing for one month. May resume usual activities as tolerated.

## 2012-12-25 NOTE — Progress Notes (Signed)
She is here for another postoperative visit for a slowly healing right lower quadrant abdominal wound that failed an attempt at secondary closure. The wound has healed and she is ready to start resuming usual activities.  On exam the right lower quadrant wound has healed.   Impression- Right lower quadrant wound status post attempted reclosure-The wound has now healed.  Plan: Keep a dry dressing on it for another month to avoid trauma from her clothing. Start resuming normal activities. Return visit when necessary.

## 2012-12-28 ENCOUNTER — Telehealth: Payer: Self-pay | Admitting: Internal Medicine

## 2012-12-28 MED ORDER — HYDROCODONE-ACETAMINOPHEN 5-325 MG PO TABS: 1.0000 | ORAL_TABLET | Freq: Four times a day (QID) | ORAL | Status: DC | PRN

## 2012-12-28 NOTE — Telephone Encounter (Signed)
Called the patient informed hardcopy's are ready for pickup at the front desk.  Also did inform of letter enclosed along with the refills and content of letter explained as well.

## 2012-12-28 NOTE — Telephone Encounter (Signed)
Patient called requesting a refill on her HYDROcodone-acetaminophen (NORCO/VICODIN) 5-325 MG per tablet Stated she has 4 left, please advise if this can be done

## 2012-12-28 NOTE — Telephone Encounter (Signed)
Done hardcopy to robin - to also let pt know:  You are given the letter today explaining the transitional pain medication refill policy due to recent change in [place] law  Please be aware that I will no longer be able to offer monthly refills of any Schedule II or higher medication starting Mar 18, 2013

## 2012-12-28 NOTE — Addendum Note (Signed)
Addended by: Corwin Levins on: 12/28/2012 12:53 PM   Modules accepted: Orders

## 2013-01-01 ENCOUNTER — Telehealth (INDEPENDENT_AMBULATORY_CARE_PROVIDER_SITE_OTHER): Payer: Self-pay

## 2013-01-01 NOTE — Telephone Encounter (Signed)
Patient states she needs her oxygen to be picked up she does not need it anymore . Called [facility] Patients PCP Dr. Sunnie Nielsen had ordered  O2, they would pass  this to Home[facility] RN

## 2013-01-05 ENCOUNTER — Other Ambulatory Visit: Payer: Self-pay | Admitting: Internal Medicine

## 2013-01-23 ENCOUNTER — Ambulatory Visit: Payer: Medicare Other | Admitting: Family Medicine

## 2013-01-23 DIAGNOSIS — Z0289 Encounter for other administrative examinations: Secondary | ICD-10-CM

## 2013-02-02 ENCOUNTER — Ambulatory Visit (INDEPENDENT_AMBULATORY_CARE_PROVIDER_SITE_OTHER): Payer: Medicare Other | Admitting: Cardiovascular Disease

## 2013-02-02 ENCOUNTER — Encounter: Payer: Self-pay | Admitting: Cardiovascular Disease

## 2013-02-02 VITALS — BP 138/62 | HR 65 | Ht 62.0 in | Wt 165.0 lb

## 2013-02-02 DIAGNOSIS — I5032 Chronic diastolic (congestive) heart failure: Secondary | ICD-10-CM

## 2013-02-02 DIAGNOSIS — I340 Nonrheumatic mitral (valve) insufficiency: Secondary | ICD-10-CM

## 2013-02-02 DIAGNOSIS — I472 Ventricular tachycardia: Secondary | ICD-10-CM

## 2013-02-02 DIAGNOSIS — I059 Rheumatic mitral valve disease, unspecified: Secondary | ICD-10-CM

## 2013-02-02 DIAGNOSIS — I351 Nonrheumatic aortic (valve) insufficiency: Secondary | ICD-10-CM

## 2013-02-02 DIAGNOSIS — I359 Nonrheumatic aortic valve disorder, unspecified: Secondary | ICD-10-CM

## 2013-02-02 NOTE — Patient Instructions (Signed)
Your physician wants you to follow-up in:  12 months.  You will receive a reminder letter in the mail two months in advance. If you don't receive a letter, please call our office to schedule the follow-up appointment.   

## 2013-02-02 NOTE — Progress Notes (Signed)
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 She was referred in May of 2010 for cardiology risk assessment prior to planned thoracotomy which she had in June 2010. Her echo prior to the surgery showed normal LV function with mild diastolic dysfunction and mild AI. She was seen in the ED at [facility] October 28, 2010 for left flank pain. It was felt to be musculoskeletal. Cardiac enzymes were normal. She was seen by Norma Fredrickson, NP on 03/02/11 for c/o palpitations. An event monitor was placed and this showed PACs with short run 9 beat run of VT. Lower extremity arterial dopplers were normal February 2013. She had some lower extremity edema in 2013 and was started on Lasix by primary care. Echo 01/18/12 with normal LV function.   She is here today for follow up. No chest pain, SOB. She feels well overall. Lower ext edema resolved. She has had no palpitations.   Primary Care Physician: [name]  Last Lipid Profile:Lipid Panel     Component Value Date/Time   CHOL 141 04/11/2012 1041   TRIG 108.0 04/11/2012 1041   HDL 46.50 04/11/2012 1041   CHOLHDL 3 04/11/2012 1041   VLDL 21.6 04/11/2012 1041   LDLCALC 73 04/11/2012 1041     Past Medical History  Diagnosis Date  . HYPERLIPIDEMIA 11/18/2006  . HYPERKALEMIA 02/05/2008  . ANEMIA-IRON DEFICIENCY 09/22/2008  . Anemia of other chronic disease 02/05/2008  . ANXIETY 06/03/2008  . DEPRESSION 04/28/2009  . HYPERTENSION 11/18/2006  . PERICARDITIS 01/03/2007  . AORTIC STENOSIS/ INSUFFICIENCY, NON-RHEUMATIC 12/03/2008  . SINUSITIS- ACUTE-NOS 11/20/2007  . SINUSITIS, CHRONIC 02/05/2008  . ALLERGIC RHINITIS 11/18/2006  . PNEUMONIA 05/02/2007  . C O P D 06/27/2008  . Stricture and stenosis of esophagus 11/14/2008  . GERD 01/03/2007  . BARRETTS ESOPHAGUS 11/14/2008  .  DYSPEPSIA 03/23/2007  . PRURITUS 10/08/2008  . OSTEOARTHRITIS 11/18/2006  . OSTEOARTHRITIS, KNEE, LEFT 01/03/2007  . Cervicalgia 01/13/2009  . BACK PAIN 02/04/2009    is improved  . BURSITIS, LEFT HIP 04/18/2009  . OSTEOPOROSIS 11/18/2006  . INSOMNIA-SLEEP DISORDER-UNSPEC 06/03/2008  . FATIGUE 04/18/2009  . PERIPHERAL EDEMA 02/13/2009  . MURMUR 11/18/2006  . DYSPHAGIA UNSPECIFIED 03/23/2007  . Abdominal pain, generalized 03/01/2007  . Nonspecific (abnormal) findings on radiological and other examination of body structure 06/03/2008  . Tuberculin Test Reaction 11/18/2006  . Acute bronchitis 02/23/2010  . CHOLELITHIASIS 04/24/2010  . NEPHROLITHIASIS, HX OF 04/24/2010  . Polyarthralgia 06/09/2010  . Elevated sed rate 06/11/2010  . Positive ANA (antinuclear antibody) 06/11/2010  . Rheumatoid factor positive 06/11/2010  . Cancer     lung ca  . 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 SPONDYLOSIS, CERVICAL, WITH RADICULOPATHY 01/13/2009  . Cervical radiculopathy 09/28/2010  . Lumbar radiculopathy 09/28/2010  . RENAL INSUFFICIENCY 02/05/2008    function has improved.  . Transfusion history 09-01-12    4'14 tranfused x 2 units  . Bleeding nose 09-01-12    past hx. none recent after having surgery  . Wound healing, delayed 09-01-12    right abdominal area    Past Surgical History  Procedure Laterality Date  . Abdominal hysterectomy    . Tubal ligation    . Tumor removal  08/06/08    from lungs  . Anterior cervical decomp/discectomy fusion  01/15/2011    Procedure: ANTERIOR CERVICAL  DECOMPRESSION/DISCECTOMY FUSION 2 LEVELS;  Surgeon: Temple Pacini;  Location: [facility];  Service: Neurosurgery;  Laterality: N/A;  Cervical Three-Four, Cervical Four-Five Anterior Cervical Decompression Fusion   . Colonscopy    . Nose surgery    . Laparoscopic appendectomy N/A 05/25/2012    Procedure: APPENDECTOMY LAPAROSCOPIC  CONVERTED TO  OPEN APPENDECTOMY, exploratory laparatomy;  Surgeon: Adolph Pollack, MD;   Location: [facility];  Service: General;  Laterality: N/A;  . Bowel resection N/A 05/25/2012    Procedure: SMALL BOWEL RESECTION;  Surgeon: Adolph Pollack, MD;  Location: [facility];  Service: General;  Laterality: N/A;  . Laparoscopic lysis of adhesions N/A 05/25/2012    Procedure: LAPAROSCOPIC LYSIS OF ADHESIONS;  Surgeon: Adolph Pollack, MD;  Location: [facility];  Service: General;  Laterality: N/A;  . Appendectomy    . Cataract extraction, bilateral  09-01-12  . Debridement of abdominal wall abscess N/A 09/07/2012    Procedure:  ABDOMINAL WOUND IRRIGATION AND TWO LAYERED CLOSURE;  Surgeon: Adolph Pollack, MD;  Location: [facility];  Service: General;  Laterality: N/A;    Current Outpatient Prescriptions  Medication Sig Dispense Refill  . albuterol (PROVENTIL HFA;VENTOLIN HFA) 108 (90 BASE) MCG/ACT inhaler Inhale 1-2 puffs into the lungs 4 (four) times daily as needed for wheezing or shortness of breath. For wheezing or shortness of breath.      Marland Kitchen alendronate (FOSAMAX) 70 MG tablet Take 1 tablet (70 mg total) by mouth every 7 (seven) days. Take with a full glass of water on an empty stomach. Patient takes on Mondays.  4 tablet  11  . alendronate (FOSAMAX) 70 MG tablet TAKE 1 TABLET (70 MG TOTAL) BY MOUTH EVERY 7 (SEVEN) DAYS. TAKE WITH A FULL GLASS OF WATER ON AN EMPTY STOMACH.  4 tablet  11  . ALPRAZolam (XANAX) 0.5 MG tablet Take 0.5 mg by mouth daily as needed for anxiety.      Marland Kitchen amLODipine (NORVASC) 5 MG tablet Take 1 tablet (5 mg total) by mouth daily.  90 tablet  3  . citalopram (CELEXA) 10 MG tablet Take 1 tablet (10 mg total) by mouth every morning.  90 tablet  3  . feeding supplement (ENSURE COMPLETE) LIQD Take 237 mLs by mouth daily.      . furosemide (LASIX) 40 MG tablet Take 1 tablet (40 mg total) by mouth daily.  30 tablet  0  . furosemide (LASIX) 40 MG tablet TAKE 1 TABLET BY MOUTH 2 TIMES DAILY  180 tablet  3  . HYDROcodone-acetaminophen (NORCO/VICODIN) 5-325 MG per tablet Take 1 tablet  by mouth every 6 (six) hours as needed. To fill Feb 26, 2013  120 tablet  0  . metoprolol succinate (TOPROL-XL) 25 MG 24 hr tablet Take 1 tablet (25 mg total) by mouth every morning.  90 tablet  1  . pantoprazole (PROTONIX) 40 MG tablet Take 40 mg by mouth daily.       . pravastatin (PRAVACHOL) 40 MG tablet TAKE 1 TABLET BY MOUTH EVERY DAY  90 tablet  3  . zolpidem (AMBIEN) 5 MG tablet Take 1 tablet (5 mg total) by mouth at bedtime as needed. For sleep.  30 tablet  5   No current facility-administered medications for this visit.    Allergies  Allergen Reactions  . Amoxicillin Itching  . Fentanyl Other (See Comments)    hallucinations  . Hydrocodone Other (See Comments)    ineffective  . Codeine Hives and Rash  .  Penicillins Hives and Rash    History   Social History  . Marital Status: Widowed    Spouse Name: N/A    Number of Children: N/A  . Years of Education: N/A   Occupational History  . Not on file.   Social History Main Topics  . Smoking status: Former Smoker -- 1.50 packs/day for 40 years    Types: Cigarettes    Quit date: 02/15/2006  . Smokeless tobacco: Former Neurosurgeon  . Alcohol Use: No  . Drug Use: No  . Sexual Activity: Yes    Birth Control/ Protection: Post-menopausal   Other Topics Concern  . Not on file   Social History Narrative  . No narrative on file    Family History  Problem Relation Age of Onset  . Hyperlipidemia Other   . Hypertension Other   . Coronary artery disease Other     several nephrew  . Heart disease Father   . Colon cancer Neg Hx   . Esophageal cancer Neg Hx   . Rectal cancer Neg Hx   . Stomach cancer Neg Hx   . Cancer Brother     ?stomach    Review of Systems:  As stated in the HPI and otherwise negative.   BP 138/62  Pulse 65  Ht 5\' 2"  (1.575 m)  Wt 165 lb (74.844 kg)  BMI 30.17 kg/m2  Physical Examination: General: Well developed, well nourished, NAD HEENT: OP clear, mucus membranes moist SKIN: warm, dry. No  rashes. Neuro: No focal deficits Musculoskeletal: Muscle strength 5/5 all ext Psychiatric: Mood and affect normal Neck: No JVD, no carotid bruits, no thyromegaly, no lymphadenopathy. Lungs:Clear bilaterally, no wheezes, rhonci, crackles Cardiovascular: Regular rate and rhythm. Soft systolic murmur. No gallops or rubs. Abdomen:Soft. Bowel sounds present. Non-tender.  Extremities: No lower extremity edema. Pulses are 2 + in the bilateral DP/PT.  Echo 01/18/12: Left ventricle: The cavity size was normal. Wall thickness was increased in a pattern of mild LVH. Systolic function was normal. The estimated ejection fraction was in the range of 55% to 65%. Wall motion was normal; there were no regional wall motion abnormalities. Doppler parameters are consistent with abnormal left ventricular relaxation (grade 1 diastolic dysfunction). - Aortic valve: AV trileaflet. Mildly calcified Despite small systolic gradient appears to open widely Mild regurgitation. - Mitral valve: Mild regurgitation. - Left atrium: The atrium was moderately dilated. - Right atrium: The atrium was mildly dilated. - Atrial septum: No defect or patent foramen ovale was identified. - Pulmonary arteries: PA peak pressure: 43mm Hg (S).  Assessment and Plan:   1. Non-sustained ventricular tachycardia:  No palpitations. Normal LV function. No recurrence of palpitations. Will continue Toprol.   2. Mitral regurgitation: Mild by echo December 2013. Repeat echo 2016.   3. Chronic diastolic CHF: Continue Lasix 40 mg po Qdaily. Use extra as needed for increased weight or swelling with extra banana.  4. Aortic insufficiency: Mild by echo December 2013. Repeat echo 2016.

## 2013-03-17 ENCOUNTER — Other Ambulatory Visit: Payer: Self-pay | Admitting: Cardiovascular Disease

## 2013-04-26 ENCOUNTER — Encounter: Payer: Self-pay | Admitting: Internal Medicine

## 2013-04-26 ENCOUNTER — Ambulatory Visit (INDEPENDENT_AMBULATORY_CARE_PROVIDER_SITE_OTHER): Payer: Managed Care, Other (non HMO) | Admitting: Internal Medicine

## 2013-04-26 VITALS — BP 144/72 | HR 68 | Temp 98.1°F | Ht 62.0 in | Wt 172.2 lb

## 2013-04-26 DIAGNOSIS — J329 Chronic sinusitis, unspecified: Secondary | ICD-10-CM

## 2013-04-26 DIAGNOSIS — M76899 Other specified enthesopathies of unspecified lower limb, excluding foot: Secondary | ICD-10-CM

## 2013-04-26 DIAGNOSIS — M5416 Radiculopathy, lumbar region: Secondary | ICD-10-CM

## 2013-04-26 DIAGNOSIS — IMO0002 Reserved for concepts with insufficient information to code with codable children: Secondary | ICD-10-CM

## 2013-04-26 DIAGNOSIS — M7072 Other bursitis of hip, left hip: Secondary | ICD-10-CM | POA: Insufficient documentation

## 2013-04-26 MED ORDER — AZITHROMYCIN 250 MG PO TABS: ORAL_TABLET | ORAL | Status: DC

## 2013-04-26 MED ORDER — TRAMADOL HCL 50 MG PO TABS: 50.0000 mg | ORAL_TABLET | Freq: Three times a day (TID) | ORAL | Status: DC | PRN

## 2013-04-26 MED ORDER — PREDNISONE 10 MG PO TABS: ORAL_TABLET | ORAL | Status: DC

## 2013-04-26 NOTE — Assessment & Plan Note (Signed)
Mod to severe, needs to avoid sleep on left side as she usually does, predpack as above, delcines referral to dr Tamala Julian for cortisone at this time

## 2013-04-26 NOTE — Assessment & Plan Note (Signed)
Also for claritin or zyrtec otc prn

## 2013-04-26 NOTE — Progress Notes (Signed)
Subjective:    Patient ID: [name], female    DOB: 10-Mar-1940, 73 y.o.   MRN: [id number]  HPI   Here with 2-3 days acute onset fever, facial pain, pressure, headache, general weakness and malaise, and greenish d/c, with mild ST and cough, but pt denies chest pain, wheezing, increased sob or doe, orthopnea, PND, increased LE swelling, palpitations, dizziness or syncope. Also with marked tender over left lateral hip area with some swelling, but also left lower back pain with mod pain, radiation to the ankle, with ? Mild LLE weakness but no numbness, and no bowel or bladder change, fever, wt loss,  gait change or falls.  Did have successful right shoulder cortisone per dr Tamala Julian recently.  Also with mild sinus allergy symptoms over the past month with mild congestion. Past Medical History  Diagnosis Date  . HYPERLIPIDEMIA 11/18/2006  . HYPERKALEMIA 02/05/2008  . ANEMIA-IRON DEFICIENCY 09/22/2008  . Anemia of other chronic disease 02/05/2008  . ANXIETY 06/03/2008  . DEPRESSION 04/28/2009  . HYPERTENSION 11/18/2006  . PERICARDITIS 01/03/2007  . AORTIC STENOSIS/ INSUFFICIENCY, NON-RHEUMATIC 12/03/2008  . SINUSITIS- ACUTE-NOS 11/20/2007  . SINUSITIS, CHRONIC 02/05/2008  . ALLERGIC RHINITIS 11/18/2006  . PNEUMONIA 05/02/2007  . C O P D 06/27/2008  . Stricture and stenosis of esophagus 11/14/2008  . GERD 01/03/2007  . BARRETTS ESOPHAGUS 11/14/2008  . DYSPEPSIA 03/23/2007  . PRURITUS 10/08/2008  . OSTEOARTHRITIS 11/18/2006  . OSTEOARTHRITIS, KNEE, LEFT 01/03/2007  . Cervicalgia 01/13/2009  . BACK PAIN 02/04/2009    is improved  . BURSITIS, LEFT HIP 04/18/2009  . OSTEOPOROSIS 11/18/2006  . INSOMNIA-SLEEP DISORDER-UNSPEC 06/03/2008  . FATIGUE 04/18/2009  . PERIPHERAL EDEMA 02/13/2009  . MURMUR 11/18/2006  . DYSPHAGIA UNSPECIFIED 03/23/2007  . Abdominal pain, generalized 03/01/2007  . Nonspecific (abnormal) findings on radiological and other examination of body structure 06/03/2008  . Tuberculin Test Reaction  11/18/2006  . Acute bronchitis 02/23/2010  . CHOLELITHIASIS 04/24/2010  . NEPHROLITHIASIS, HX OF 04/24/2010  . Polyarthralgia 06/09/2010  . Elevated sed rate 06/11/2010  . Positive ANA (antinuclear antibody) 06/11/2010  . Rheumatoid factor positive 06/11/2010  . Cancer     lung ca  . 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 SPONDYLOSIS, CERVICAL, WITH RADICULOPATHY 01/13/2009  . Cervical radiculopathy 09/28/2010  . Lumbar radiculopathy 09/28/2010  . RENAL INSUFFICIENCY 02/05/2008    function has improved.  . Transfusion history 09-01-12    4'14 tranfused x 2 units  . Bleeding nose 09-01-12    past hx. none recent after having surgery  . Wound healing, delayed 09-01-12    right abdominal area   Past Surgical History  Procedure Laterality Date  . Abdominal hysterectomy    . Tubal ligation    . Tumor removal  08/06/08    from lungs  . Anterior cervical decomp/discectomy fusion  01/15/2011    Procedure: ANTERIOR CERVICAL DECOMPRESSION/DISCECTOMY FUSION 2 LEVELS;  Surgeon: Cooper Render Pool;  Location: [facility];  Service: Neurosurgery;  Laterality: N/A;  Cervical Three-Four, Cervical Four-Five Anterior Cervical Decompression Fusion   . Colonscopy    . Nose surgery    . Laparoscopic appendectomy N/A 05/25/2012    Procedure: APPENDECTOMY LAPAROSCOPIC  CONVERTED TO  OPEN APPENDECTOMY, exploratory laparatomy;  Surgeon: Odis Hollingshead, MD;  Location: [facility];  Service: General;  Laterality: N/A;  . Bowel resection N/A 05/25/2012    Procedure: SMALL BOWEL RESECTION;  Surgeon: Odis Hollingshead, MD;  Location: [facility];  Service: General;  Laterality: N/A;  .  Laparoscopic lysis of adhesions N/A 05/25/2012    Procedure: LAPAROSCOPIC LYSIS OF ADHESIONS;  Surgeon: Odis Hollingshead, MD;  Location: [facility];  Service: General;  Laterality: N/A;  . Appendectomy    . Cataract extraction, bilateral  09-01-12  . Debridement of abdominal wall abscess N/A 09/07/2012    Procedure:  ABDOMINAL WOUND IRRIGATION AND TWO  LAYERED CLOSURE;  Surgeon: Odis Hollingshead, MD;  Location: [facility];  Service: General;  Laterality: N/A;    reports that she quit smoking about 7 years ago. Her smoking use included Cigarettes. She has a 60 pack-year smoking history. She has quit using smokeless tobacco. She reports that she does not drink alcohol or use illicit drugs. family history includes Cancer in her brother; Coronary artery disease in her other; Heart disease in her father; Hyperlipidemia in her other; Hypertension in her other. There is no history of Colon cancer, Esophageal cancer, Rectal cancer, or Stomach cancer. Allergies  Allergen Reactions  . Amoxicillin Itching  . Fentanyl Other (See Comments)    hallucinations  . Hydrocodone Other (See Comments)    ineffective  . Codeine Hives and Rash  . Penicillins Hives and Rash   Current Outpatient Prescriptions on File Prior to Visit  Medication Sig Dispense Refill  . albuterol (PROVENTIL HFA;VENTOLIN HFA) 108 (90 BASE) MCG/ACT inhaler Inhale 1-2 puffs into the lungs 4 (four) times daily as needed for wheezing or shortness of breath. For wheezing or shortness of breath.      Marland Kitchen alendronate (FOSAMAX) 70 MG tablet Take 1 tablet (70 mg total) by mouth every 7 (seven) days. Take with a full glass of water on an empty stomach. Patient takes on Mondays.  4 tablet  11  . ALPRAZolam (XANAX) 0.5 MG tablet Take 0.5 mg by mouth daily as needed for anxiety.      Marland Kitchen amLODipine (NORVASC) 5 MG tablet Take 1 tablet (5 mg total) by mouth daily.  90 tablet  3  . citalopram (CELEXA) 10 MG tablet Take 1 tablet (10 mg total) by mouth every morning.  90 tablet  3  . feeding supplement (ENSURE COMPLETE) LIQD Take 237 mLs by mouth daily.      . furosemide (LASIX) 40 MG tablet Take 1 tablet (40 mg total) by mouth daily.  30 tablet  0  . furosemide (LASIX) 40 MG tablet TAKE 1 TABLET BY MOUTH 2 TIMES DAILY  180 tablet  3  . metoprolol succinate (TOPROL-XL) 25 MG 24 hr tablet TAKE 1 TABLET (25 MG  TOTAL) BY MOUTH EVERY MORNING.  90 tablet  1  . pantoprazole (PROTONIX) 40 MG tablet Take 40 mg by mouth daily.       . pravastatin (PRAVACHOL) 40 MG tablet TAKE 1 TABLET BY MOUTH EVERY DAY  90 tablet  3  . zolpidem (AMBIEN) 5 MG tablet Take 1 tablet (5 mg total) by mouth at bedtime as needed. For sleep.  30 tablet  5   No current facility-administered medications on file prior to visit.   Review of Systems  Constitutional: Negative for unexpected weight change, or unusual diaphoresis  HENT: Negative for tinnitus.   Eyes: Negative for photophobia and visual disturbance.  Respiratory: Negative for choking and stridor.   Gastrointestinal: Negative for vomiting and blood in stool.  Genitourinary: Negative for hematuria and decreased urine volume.  Musculoskeletal: Negative for acute joint swelling Skin: Negative for color change and wound.  Neurological: Negative for tremors and numbness other than noted  Psychiatric/Behavioral: Negative for  decreased concentration or  hyperactivity.       Objective:   Physical Exam BP 144/72  Pulse 68  Temp(Src) 98.1 F (36.7 C) (Oral)  Ht 5\' 2"  (1.575 m)  Wt 172 lb 4 oz (78.132 kg)  BMI 31.50 kg/m2  SpO2 92% VS noted,  Constitutional: Pt appears well-developed and well-nourished.  HENT: Head: NCAT.  Right Ear: External ear normal.  Left Ear: External ear normal.  Eyes: Conjunctivae and EOM are normal. Pupils are equal, round, and reactive to light.  Neck: Normal range of motion. Neck supple.  Cardiovascular: Normal rate and regular rhythm.   Pulmonary/Chest: Effort normal and breath sounds normal.  Abd:  Soft, NT, non-distended, + BS Neurological: Pt is alert. Not confused , motor with ? Trace distal LLE weakness o/w motor 5/5 Spine nontender but with tender left paravertebral area Skin: Skin is warm. No erythema.  Psychiatric: Pt behavior is normal. Thought content normal.  Marked tender over left greater trochanter without erythema or  rash    Assessment & Plan:

## 2013-04-26 NOTE — Progress Notes (Signed)
Pre visit review using our clinic review tool, if applicable. No additional management support is needed unless otherwise documented below in the visit note. 

## 2013-04-26 NOTE — Patient Instructions (Signed)
Please take all new medication as prescribed - the antibiotic, prednisone, and the pain medication if needed  You should also consider taking OTC Claritin 10 mg per day (or zyrtec if it does not make you sleepy for the sinus allergy symptoms)  Please continue all other medications as before, and refills have been done if requested. Please have the pharmacy call with any other refills you may need.  Please continue your efforts at being more active, low cholesterol diet, and weight control.  Please return if not improved in 1-2 wks, or call Dr Tamala Julian if you feel you need the cortisone shot to the left hip area

## 2013-04-26 NOTE — Assessment & Plan Note (Signed)
Mild, for tramadol and predpack asd, consider MRI if not improved 1-2 wks

## 2013-05-22 ENCOUNTER — Ambulatory Visit (INDEPENDENT_AMBULATORY_CARE_PROVIDER_SITE_OTHER): Payer: Managed Care, Other (non HMO) | Admitting: Internal Medicine

## 2013-05-22 ENCOUNTER — Encounter: Payer: Self-pay | Admitting: Internal Medicine

## 2013-05-22 VITALS — BP 130/68 | HR 64 | Temp 98.0°F | Wt 174.1 lb

## 2013-05-22 DIAGNOSIS — I10 Essential (primary) hypertension: Secondary | ICD-10-CM

## 2013-05-22 DIAGNOSIS — Z0000 Encounter for general adult medical examination without abnormal findings: Secondary | ICD-10-CM

## 2013-05-22 DIAGNOSIS — N189 Chronic kidney disease, unspecified: Secondary | ICD-10-CM

## 2013-05-22 DIAGNOSIS — M25571 Pain in right ankle and joints of right foot: Secondary | ICD-10-CM

## 2013-05-22 DIAGNOSIS — I509 Heart failure, unspecified: Secondary | ICD-10-CM

## 2013-05-22 DIAGNOSIS — M25572 Pain in left ankle and joints of left foot: Principal | ICD-10-CM

## 2013-05-22 DIAGNOSIS — M25579 Pain in unspecified ankle and joints of unspecified foot: Secondary | ICD-10-CM

## 2013-05-22 DIAGNOSIS — I5031 Acute diastolic (congestive) heart failure: Secondary | ICD-10-CM

## 2013-05-22 MED ORDER — METHYLPREDNISOLONE ACETATE 80 MG/ML IJ SUSP
80.0000 mg | Freq: Once | INTRAMUSCULAR | Status: AC
  Administered 2013-05-22: 80 mg via INTRAMUSCULAR

## 2013-05-22 MED ORDER — TRAMADOL HCL 50 MG PO TABS
50.0000 mg | ORAL_TABLET | Freq: Three times a day (TID) | ORAL | Status: DC | PRN
Start: 2013-05-22 — End: 2013-10-16

## 2013-05-22 MED ORDER — PREDNISONE 10 MG PO TABS: ORAL_TABLET | ORAL | Status: DC

## 2013-05-22 NOTE — Patient Instructions (Signed)
You had the steroid shot today  Please take all new medication as prescribed - the prednisone  Please continue all other medications as before, and refills have been done if requested. - the tramadol  Please have the pharmacy call with any other refills you may need.  Please continue your efforts at being more active, low cholesterol diet, and weight control.  Please keep your appointments with your specialists as you have planned  Please return in 3 months, or sooner if needed, with Lab testing done 3-5 days before

## 2013-05-22 NOTE — Assessment & Plan Note (Signed)
Stable, dec labs reviewed with pt, for f/u labs next vist, declines today, now sees renal yearly

## 2013-05-22 NOTE — Progress Notes (Signed)
Subjective:    Patient ID: [name], female    DOB: [birth year], 73 y.o.   MRN: [id number]  HPI  Here to f/u; overall doing ok,  Pt denies chest pain, increased sob or doe, wheezing, orthopnea, PND, increased LE swelling, palpitations, dizziness or syncope.  Pt denies polydipsia, polyuria, or low sugar symptoms such as weakness or confusion improved with po intake.  Pt denies new neurological symptoms such as new headache, or facial or extremity weakness or numbness.   Pt states overall good compliance with meds, has been trying to follow lower cholesterol, diabetic diet, with wt overall stable,  but little exercise however.  Has recent Sudden onset bilat ankle pain, swelling x 3 days, hard to walk, makes her irritable today. Needs tramadol prn Past Medical History  Diagnosis Date  . HYPERLIPIDEMIA 11/18/2006  . HYPERKALEMIA 02/05/2008  . ANEMIA-IRON DEFICIENCY 09/22/2008  . Anemia of other chronic disease 02/05/2008  . ANXIETY 06/03/2008  . DEPRESSION 04/28/2009  . HYPERTENSION 11/18/2006  . PERICARDITIS 01/03/2007  . AORTIC STENOSIS/ INSUFFICIENCY, NON-RHEUMATIC 12/03/2008  . SINUSITIS- ACUTE-NOS 11/20/2007  . SINUSITIS, CHRONIC 02/05/2008  . ALLERGIC RHINITIS 11/18/2006  . PNEUMONIA 05/02/2007  . C O P D 06/27/2008  . Stricture and stenosis of esophagus 11/14/2008  . GERD 01/03/2007  . BARRETTS ESOPHAGUS 11/14/2008  . DYSPEPSIA 03/23/2007  . PRURITUS 10/08/2008  . OSTEOARTHRITIS 11/18/2006  . OSTEOARTHRITIS, KNEE, LEFT 01/03/2007  . Cervicalgia 01/13/2009  . BACK PAIN 02/04/2009    is improved  . BURSITIS, LEFT HIP 04/18/2009  . OSTEOPOROSIS 11/18/2006  . INSOMNIA-SLEEP DISORDER-UNSPEC 06/03/2008  . FATIGUE 04/18/2009  . PERIPHERAL EDEMA 02/13/2009  . MURMUR 11/18/2006  . DYSPHAGIA UNSPECIFIED 03/23/2007  . Abdominal pain, generalized 03/01/2007  . Nonspecific (abnormal) findings on radiological and other examination of body structure 06/03/2008  . Tuberculin Test Reaction 11/18/2006  . Acute  bronchitis 02/23/2010  . CHOLELITHIASIS 04/24/2010  . NEPHROLITHIASIS, HX OF 04/24/2010  . Polyarthralgia 06/09/2010  . Elevated sed rate 06/11/2010  . Positive ANA (antinuclear antibody) 06/11/2010  . Rheumatoid factor positive 06/11/2010  . Cancer     lung ca  . 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 SPONDYLOSIS, CERVICAL, WITH RADICULOPATHY 01/13/2009  . Cervical radiculopathy 09/28/2010  . Lumbar radiculopathy 09/28/2010  . RENAL INSUFFICIENCY 02/05/2008    function has improved.  . Transfusion history 09-01-12    4'14 tranfused x 2 units  . Bleeding nose 09-01-12    past hx. none recent after having surgery  . Wound healing, delayed 09-01-12    right abdominal area   Past Surgical History  Procedure Laterality Date  . Abdominal hysterectomy    . Tubal ligation    . Tumor removal  08/06/08    from lungs  . Anterior cervical decomp/discectomy fusion  01/15/2011    Procedure: ANTERIOR CERVICAL DECOMPRESSION/DISCECTOMY FUSION 2 LEVELS;  Surgeon: Cooper Render Pool;  Location: [facility];  Service: Neurosurgery;  Laterality: N/A;  Cervical Three-Four, Cervical Four-Five Anterior Cervical Decompression Fusion   . Colonscopy    . Nose surgery    . Laparoscopic appendectomy N/A 05/25/2012    Procedure: APPENDECTOMY LAPAROSCOPIC  CONVERTED TO  OPEN APPENDECTOMY, exploratory laparatomy;  Surgeon: Odis Hollingshead, MD;  Location: [facility];  Service: General;  Laterality: N/A;  . Bowel resection N/A 05/25/2012    Procedure: SMALL BOWEL RESECTION;  Surgeon: Odis Hollingshead, MD;  Location: [facility];  Service: General;  Laterality: N/A;  . Laparoscopic lysis of adhesions N/A  05/25/2012    Procedure: LAPAROSCOPIC LYSIS OF ADHESIONS;  Surgeon: Odis Hollingshead, MD;  Location: [facility];  Service: General;  Laterality: N/A;  . Appendectomy    . Cataract extraction, bilateral  09-01-12  . Debridement of abdominal wall abscess N/A 09/07/2012    Procedure:  ABDOMINAL WOUND IRRIGATION AND TWO LAYERED CLOSURE;   Surgeon: Odis Hollingshead, MD;  Location: [facility];  Service: General;  Laterality: N/A;    reports that she quit smoking about 7 years ago. Her smoking use included Cigarettes. She has a 60 pack-year smoking history. She has quit using smokeless tobacco. She reports that she does not drink alcohol or use illicit drugs. family history includes Cancer in her brother; Coronary artery disease in her other; Heart disease in her father; Hyperlipidemia in her other; Hypertension in her other. There is no history of Colon cancer, Esophageal cancer, Rectal cancer, or Stomach cancer. Allergies  Allergen Reactions  . Amoxicillin Itching  . Fentanyl Other (See Comments)    hallucinations  . Hydrocodone Other (See Comments)    ineffective  . Codeine Hives and Rash  . Penicillins Hives and Rash   Current Outpatient Prescriptions on File Prior to Visit  Medication Sig Dispense Refill  . albuterol (PROVENTIL HFA;VENTOLIN HFA) 108 (90 BASE) MCG/ACT inhaler Inhale 1-2 puffs into the lungs 4 (four) times daily as needed for wheezing or shortness of breath. For wheezing or shortness of breath.      Marland Kitchen alendronate (FOSAMAX) 70 MG tablet Take 1 tablet (70 mg total) by mouth every 7 (seven) days. Take with a full glass of water on an empty stomach. Patient takes on Mondays.  4 tablet  11  . ALPRAZolam (XANAX) 0.5 MG tablet Take 0.5 mg by mouth daily as needed for anxiety.      Marland Kitchen amLODipine (NORVASC) 5 MG tablet Take 1 tablet (5 mg total) by mouth daily.  90 tablet  3  . citalopram (CELEXA) 10 MG tablet Take 1 tablet (10 mg total) by mouth every morning.  90 tablet  3  . feeding supplement (ENSURE COMPLETE) LIQD Take 237 mLs by mouth daily.      . furosemide (LASIX) 40 MG tablet TAKE 1 TABLET BY MOUTH 2 TIMES DAILY  180 tablet  3  . metoprolol succinate (TOPROL-XL) 25 MG 24 hr tablet TAKE 1 TABLET (25 MG TOTAL) BY MOUTH EVERY MORNING.  90 tablet  1  . pantoprazole (PROTONIX) 40 MG tablet Take 40 mg by mouth daily.        . pravastatin (PRAVACHOL) 40 MG tablet TAKE 1 TABLET BY MOUTH EVERY DAY  90 tablet  3  . zolpidem (AMBIEN) 5 MG tablet Take 1 tablet (5 mg total) by mouth at bedtime as needed. For sleep.  30 tablet  5   No current facility-administered medications on file prior to visit.   Review of Systems  Constitutional: Negative for unexpected weight change, or unusual diaphoresis  HENT: Negative for tinnitus.   Eyes: Negative for photophobia and visual disturbance.  Respiratory: Negative for choking and stridor.   Gastrointestinal: Negative for vomiting and blood in stool.  Genitourinary: Negative for hematuria and decreased urine volume.  Musculoskeletal: Negative for acute joint swelling Skin: Negative for color change and wound.  Neurological: Negative for tremors and numbness other than noted  Psychiatric/Behavioral: Negative for decreased concentration or  hyperactivity.       Objective:   Physical Exam BP 130/68  Pulse 64  Temp(Src) 98 F (36.7  C) (Oral)  Wt 174 lb 2 oz (78.983 kg)  SpO2 91% VS noted,  Constitutional: Pt appears well-developed and well-nourished.  HENT: Head: NCAT.  Right Ear: External ear normal.  Left Ear: External ear normal.  Eyes: Conjunctivae and EOM are normal. Pupils are equal, round, and reactive to light.  Neck: Normal range of motion. Neck supple.  Cardiovascular: Normal rate and regular rhythm.   Pulmonary/Chest: Effort normal and breath sounds normal.  Abd:  Soft, NT, non-distended, + BS Neurological: Pt is alert. Not confused  Skin: Skin is warm. No erythema.   Bilat ankle effusion mild, NT Psychiatric: Pt behavior is normal. Thought content normal.     Assessment & Plan:

## 2013-05-22 NOTE — Progress Notes (Signed)
Pre visit review using our clinic review tool, if applicable. No additional management support is needed unless otherwise documented below in the visit note. 

## 2013-05-22 NOTE — Assessment & Plan Note (Signed)
With effusion left > right, afeb, sudden onset, likely gouty arthritis attack, first episode, on lasix but to continue; will check uric acid with next labs, today for refill tramadol, also depomedrol IM today, and predpack for home

## 2013-05-27 NOTE — Assessment & Plan Note (Signed)
stable overall by history and exam, recent data reviewed with pt, and pt to continue medical treatment as before,  to f/u any worsening symptoms or concerns BP Readings from Last 3 Encounters:  05/22/13 130/68  04/26/13 144/72  02/02/13 138/62

## 2013-05-27 NOTE — Assessment & Plan Note (Signed)
stable overall by history and exam, recent data reviewed with pt, and pt to continue medical treatment as before,  to f/u any worsening symptoms or concerns Lab Results  Component Value Date   WBC 4.8 09/20/2012   HGB 10.2* 09/20/2012   HCT 31.5* 09/20/2012   PLT 181 09/20/2012   GLUCOSE 101 09/20/2012   CHOL 141 04/11/2012   TRIG 108.0 04/11/2012   HDL 46.50 04/11/2012   LDLDIRECT 141.9 05/02/2006   LDLCALC 73 04/11/2012   ALT 10 09/20/2012   AST 8 09/20/2012   NA 140 09/20/2012   K 4.2 09/20/2012   CL 97 09/10/2012   CREATININE 1.7* 09/20/2012   BUN 29.7* 09/20/2012   CO2 28 09/20/2012   TSH 1.76 04/11/2012   INR 1.04 09/01/2012

## 2013-06-04 ENCOUNTER — Other Ambulatory Visit (HOSPITAL_BASED_OUTPATIENT_CLINIC_OR_DEPARTMENT_OTHER): Payer: Managed Care, Other (non HMO)

## 2013-06-04 ENCOUNTER — Encounter (HOSPITAL_COMMUNITY): Payer: Self-pay

## 2013-06-04 ENCOUNTER — Ambulatory Visit (HOSPITAL_COMMUNITY)
Admission: RE | Admit: 2013-06-04 | Discharge: 2013-06-04 | Disposition: A | Discharge status: Home / Self Care | Payer: Medicare HMO | Source: Ambulatory Visit | Attending: Physician Assistant | Admitting: Physician Assistant

## 2013-06-04 DIAGNOSIS — N200 Calculus of kidney: Secondary | ICD-10-CM | POA: Insufficient documentation

## 2013-06-04 DIAGNOSIS — J984 Other disorders of lung: Secondary | ICD-10-CM | POA: Insufficient documentation

## 2013-06-04 DIAGNOSIS — I2510 Atherosclerotic heart disease of native coronary artery without angina pectoris: Secondary | ICD-10-CM | POA: Insufficient documentation

## 2013-06-04 DIAGNOSIS — I7090 Unspecified atherosclerosis: Secondary | ICD-10-CM | POA: Insufficient documentation

## 2013-06-04 DIAGNOSIS — C3490 Malignant neoplasm of unspecified part of unspecified bronchus or lung: Secondary | ICD-10-CM

## 2013-06-04 DIAGNOSIS — C3410 Malignant neoplasm of upper lobe, unspecified bronchus or lung: Secondary | ICD-10-CM

## 2013-06-04 DIAGNOSIS — I7789 Other specified disorders of arteries and arterioles: Secondary | ICD-10-CM | POA: Insufficient documentation

## 2013-06-04 LAB — COMPREHENSIVE METABOLIC PANEL (CC13)
ALK PHOS: 58 U/L (ref 40–150)
ALT: 15 U/L (ref 0–55)
AST: 11 U/L (ref 5–34)
Albumin: 3.5 g/dL (ref 3.5–5.0)
Anion Gap: 10 mEq/L (ref 3–11)
BUN: 41.5 mg/dL — AB (ref 7.0–26.0)
CHLORIDE: 106 meq/L (ref 98–109)
CO2: 28 mEq/L (ref 22–29)
CREATININE: 2 mg/dL — AB (ref 0.6–1.1)
Calcium: 9.8 mg/dL (ref 8.4–10.4)
Glucose: 94 mg/dl (ref 70–140)
Potassium: 4.7 mEq/L (ref 3.5–5.1)
Sodium: 144 mEq/L (ref 136–145)
Total Bilirubin: 0.34 mg/dL (ref 0.20–1.20)
Total Protein: 7.2 g/dL (ref 6.4–8.3)

## 2013-06-04 LAB — CBC WITH DIFFERENTIAL/PLATELET
BASO%: 0.4 % (ref 0.0–2.0)
Basophils Absolute: 0 10*3/uL (ref 0.0–0.1)
EOS%: 1 % (ref 0.0–7.0)
Eosinophils Absolute: 0.1 10*3/uL (ref 0.0–0.5)
HEMATOCRIT: 38 % (ref 34.8–46.6)
HGB: 12.2 g/dL (ref 11.6–15.9)
LYMPH#: 1.4 10*3/uL (ref 0.9–3.3)
LYMPH%: 19.3 % (ref 14.0–49.7)
MCH: 31.8 pg (ref 25.1–34.0)
MCHC: 32.1 g/dL (ref 31.5–36.0)
MCV: 99.2 fL (ref 79.5–101.0)
MONO#: 0.7 10*3/uL (ref 0.1–0.9)
MONO%: 9.5 % (ref 0.0–14.0)
NEUT#: 5.1 10*3/uL (ref 1.5–6.5)
NEUT%: 69.8 % (ref 38.4–76.8)
Platelets: 207 10*3/uL (ref 145–400)
RBC: 3.83 10*6/uL (ref 3.70–5.45)
RDW: 13.9 % (ref 11.2–14.5)
WBC: 7.3 10*3/uL (ref 3.9–10.3)

## 2013-06-06 ENCOUNTER — Telehealth: Payer: Self-pay | Admitting: Internal Medicine

## 2013-06-06 ENCOUNTER — Encounter: Payer: Self-pay | Admitting: Internal Medicine

## 2013-06-06 ENCOUNTER — Ambulatory Visit (HOSPITAL_BASED_OUTPATIENT_CLINIC_OR_DEPARTMENT_OTHER): Payer: Managed Care, Other (non HMO) | Admitting: Internal Medicine

## 2013-06-06 VITALS — BP 121/70 | HR 72 | Temp 98.3°F | Resp 20 | Ht 62.0 in | Wt 170.2 lb

## 2013-06-06 DIAGNOSIS — C3490 Malignant neoplasm of unspecified part of unspecified bronchus or lung: Secondary | ICD-10-CM

## 2013-06-06 DIAGNOSIS — C3410 Malignant neoplasm of upper lobe, unspecified bronchus or lung: Secondary | ICD-10-CM

## 2013-06-06 NOTE — Progress Notes (Signed)
[facility] [phone]   [phone]  OFFICE PROGRESS NOTE  Cathlean Cower, MD [address] 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  PRIOR THERAPY:Left upper lobectomy with node dissection and bronchoplasty and intercostal muscle flap under the care of Dr. Arlyce Dice on 08/06/2008.  CURRENT THERAPY: Observation.  INTERVAL HISTORY: [name] 73 y.o. female returns to the clinic today for annual followup visit accompanied by her daughter. The patient has no complaints today nasal congestion and seasonal allergy as well as postnasal drainage. She is followed by her primary care physician Dr. Jenny Reichmann for this issues. She denied having any significant chest pain, shortness of breath, cough or hemoptysis. She denied having any significant weight loss or night sweats. The patient has repeat CT scan of the chest performed recently and she is here today for evaluation and discussion of her scan results.  MEDICAL HISTORY: Past Medical History  Diagnosis Date  . HYPERLIPIDEMIA 11/18/2006  . HYPERKALEMIA 02/05/2008  . ANEMIA-IRON DEFICIENCY 09/22/2008  . Anemia of other chronic disease 02/05/2008  . ANXIETY 06/03/2008  . DEPRESSION 04/28/2009  . HYPERTENSION 11/18/2006  . PERICARDITIS 01/03/2007  . AORTIC STENOSIS/ INSUFFICIENCY, NON-RHEUMATIC 12/03/2008  . SINUSITIS- ACUTE-NOS 11/20/2007  . SINUSITIS, CHRONIC 02/05/2008  . ALLERGIC RHINITIS 11/18/2006  . PNEUMONIA 05/02/2007  . C O P D 06/27/2008  . Stricture and stenosis of esophagus 11/14/2008  . GERD 01/03/2007  . BARRETTS ESOPHAGUS 11/14/2008  . DYSPEPSIA 03/23/2007  . PRURITUS 10/08/2008  . OSTEOARTHRITIS 11/18/2006  . OSTEOARTHRITIS, KNEE, LEFT 01/03/2007  . Cervicalgia 01/13/2009  . BACK PAIN 02/04/2009    is improved  . BURSITIS, LEFT HIP 04/18/2009  .  OSTEOPOROSIS 11/18/2006  . INSOMNIA-SLEEP DISORDER-UNSPEC 06/03/2008  . FATIGUE 04/18/2009  . PERIPHERAL EDEMA 02/13/2009  . MURMUR 11/18/2006  . DYSPHAGIA UNSPECIFIED 03/23/2007  . Abdominal pain, generalized 03/01/2007  . Nonspecific (abnormal) findings on radiological and other examination of body structure 06/03/2008  . Tuberculin Test Reaction 11/18/2006  . Acute bronchitis 02/23/2010  . CHOLELITHIASIS 04/24/2010  . NEPHROLITHIASIS, HX OF 04/24/2010  . Polyarthralgia 06/09/2010  . Elevated sed rate 06/11/2010  . Positive ANA (antinuclear antibody) 06/11/2010  . Rheumatoid factor positive 06/11/2010  . Cancer     lung ca  . 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 SPONDYLOSIS, CERVICAL, WITH RADICULOPATHY 01/13/2009  . Cervical radiculopathy 09/28/2010  . Lumbar radiculopathy 09/28/2010  . RENAL INSUFFICIENCY 02/05/2008    function has improved.  . Transfusion history 09-01-12    4'14 tranfused x 2 units  . Bleeding nose 09-01-12    past hx. none recent after having surgery  . Wound healing, delayed 09-01-12    right abdominal area    ALLERGIES:  is allergic to amoxicillin; fentanyl; hydrocodone; codeine; and penicillins.  MEDICATIONS:  Current Outpatient Prescriptions  Medication Sig Dispense Refill  . alendronate (FOSAMAX) 70 MG tablet Take 1 tablet (70 mg total) by mouth every 7 (seven) days. Take with a full glass of water on an empty stomach. Patient takes on Mondays.  4 tablet  11  . ALPRAZolam (XANAX) 0.5 MG tablet Take 0.5 mg by mouth daily as needed for anxiety.      Marland Kitchen amLODipine (NORVASC) 5 MG tablet Take 1 tablet (5 mg total) by mouth daily.  90 tablet  3  . citalopram (CELEXA) 10 MG  tablet Take 1 tablet (10 mg total) by mouth every morning.  90 tablet  3  . furosemide (LASIX) 40 MG tablet TAKE 1 TABLET BY MOUTH 2 TIMES DAILY  180 tablet  3  . metoprolol succinate (TOPROL-XL) 25 MG 24 hr tablet TAKE 1 TABLET (25 MG TOTAL) BY MOUTH EVERY MORNING.  90 tablet  1  . pantoprazole  (PROTONIX) 40 MG tablet Take 40 mg by mouth daily.       . pravastatin (PRAVACHOL) 40 MG tablet TAKE 1 TABLET BY MOUTH EVERY DAY  90 tablet  3  . traMADol (ULTRAM) 50 MG tablet Take 1 tablet (50 mg total) by mouth every 8 (eight) hours as needed.  60 tablet  2  . albuterol (PROVENTIL HFA;VENTOLIN HFA) 108 (90 BASE) MCG/ACT inhaler Inhale 1-2 puffs into the lungs 4 (four) times daily as needed for wheezing or shortness of breath. For wheezing or shortness of breath.      . feeding supplement (ENSURE COMPLETE) LIQD Take 237 mLs by mouth daily.      . predniSONE (DELTASONE) 10 MG tablet 3 tabs by mouth per day for 3 days,2tabs per day for 3 days,1tab per day for 3 days  18 tablet  0  . zolpidem (AMBIEN) 5 MG tablet Take 1 tablet (5 mg total) by mouth at bedtime as needed. For sleep.  30 tablet  5   No current facility-administered medications for this visit.    SURGICAL HISTORY:  Past Surgical History  Procedure Laterality Date  . Abdominal hysterectomy    . Tubal ligation    . Tumor removal  08/06/08    from lungs  . Anterior cervical decomp/discectomy fusion  01/15/2011    Procedure: ANTERIOR CERVICAL DECOMPRESSION/DISCECTOMY FUSION 2 LEVELS;  Surgeon: Cooper Render Pool;  Location: [facility];  Service: Neurosurgery;  Laterality: N/A;  Cervical Three-Four, Cervical Four-Five Anterior Cervical Decompression Fusion   . Colonscopy    . Nose surgery    . Laparoscopic appendectomy N/A 05/25/2012    Procedure: APPENDECTOMY LAPAROSCOPIC  CONVERTED TO  OPEN APPENDECTOMY, exploratory laparatomy;  Surgeon: Odis Hollingshead, MD;  Location: [facility];  Service: General;  Laterality: N/A;  . Bowel resection N/A 05/25/2012    Procedure: SMALL BOWEL RESECTION;  Surgeon: Odis Hollingshead, MD;  Location: [facility];  Service: General;  Laterality: N/A;  . Laparoscopic lysis of adhesions N/A 05/25/2012    Procedure: LAPAROSCOPIC LYSIS OF ADHESIONS;  Surgeon: Odis Hollingshead, MD;  Location: [facility];  Service: General;   Laterality: N/A;  . Appendectomy    . Cataract extraction, bilateral  09-01-12  . Debridement of abdominal wall abscess N/A 09/07/2012    Procedure:  ABDOMINAL WOUND IRRIGATION AND TWO LAYERED CLOSURE;  Surgeon: Odis Hollingshead, MD;  Location: [facility];  Service: General;  Laterality: N/A;    REVIEW OF SYSTEMS:  A comprehensive review of systems was negative.   PHYSICAL EXAMINATION: General appearance: alert, cooperative and no distress Head: Normocephalic, without obvious abnormality, atraumatic Neck: no adenopathy Lymph nodes: Cervical, supraclavicular, and axillary nodes normal. Resp: clear to auscultation bilaterally Back: symmetric, no curvature. ROM normal. No CVA tenderness. Cardio: regular rate and rhythm, S1, S2 normal, no murmur, click, rub or gallop GI: soft, non-tender; bowel sounds normal; no masses,  no organomegaly Extremities: extremities normal, atraumatic, no cyanosis or edema Neurologic: Alert and oriented X 3, normal strength and tone. Normal symmetric reflexes. Normal coordination and gait  ECOG PERFORMANCE STATUS: 0 - Asymptomatic  Blood  pressure 121/70, pulse 72, temperature 98.3 F (36.8 C), temperature source Oral, resp. rate 20, height 5\' 2"  (1.575 m), weight 170 lb 3.2 oz (77.202 kg).  LABORATORY DATA: Lab Results  Component Value Date   WBC 7.3 06/04/2013   HGB 12.2 06/04/2013   HCT 38.0 06/04/2013   MCV 99.2 06/04/2013   PLT 207 06/04/2013      Chemistry      Component Value Date/Time   NA 144 06/04/2013 1001   NA 135 09/10/2012 0546   NA 147* 09/20/2011 0817   K 4.7 06/04/2013 1001   K 4.4 09/10/2012 0546   K 4.2 09/20/2011 0817   CL 97 09/10/2012 0546   CL 100 09/20/2011 0817   CO2 28 06/04/2013 1001   CO2 30 09/10/2012 0546   CO2 29 09/20/2011 0817   BUN 41.5* 06/04/2013 1001   BUN 25* 09/10/2012 0546   BUN 20 09/20/2011 0817   CREATININE 2.0* 06/04/2013 1001   CREATININE 1.60* 09/10/2012 0546   CREATININE 1.6* 09/20/2011 0817      Component Value Date/Time     CALCIUM 9.8 06/04/2013 1001   CALCIUM 9.4 09/10/2012 0546   CALCIUM 9.1 09/20/2011 0817   ALKPHOS 58 06/04/2013 1001   ALKPHOS 56 09/01/2012 1115   ALKPHOS 69 09/20/2011 0817   AST 11 06/04/2013 1001   AST 8 09/01/2012 1115   AST 17 09/20/2011 0817   ALT 15 06/04/2013 1001   ALT 6 09/01/2012 1115   ALT 23 09/20/2011 0817   BILITOT 0.34 06/04/2013 1001   BILITOT 0.2* 09/01/2012 1115   BILITOT 0.60 09/20/2011 0817       RADIOGRAPHIC STUDIES: Ct Chest Wo Contrast  06/04/2013   CLINICAL DATA:  Squamous cell carcinoma.  Restaging.  EXAM: CT CHEST WITHOUT CONTRAST  TECHNIQUE: Multidetector CT imaging of the chest was performed following the standard protocol without IV contrast.  COMPARISON:  Plain film 09/07/2012.  Chest CT 06/01/2012.  FINDINGS: Lungs/Pleura:  Minimal motion degradation.  Surgical changes in the left upper lung. No evidence of locally recurrent disease.  No suspicious pulmonary nodule. Basilar predominant interstitial thickening is similar in the lower lobes. Greater on the right. May be minimally increased at the inferior right middle lobe.  No pleural fluid.  Heart/Mediastinum: No supraclavicular adenopathy. Aortic and branch vessel atherosclerosis. Mild cardiomegaly. Advanced coronary artery atherosclerosis. No pericardial effusion. Mild pulmonary artery enlargement, with the outflow tract measuring 3.2 cm.  No mediastinal or definite hilar adenopathy, given limitations of unenhanced CT.  Chronic interstitial thickening in the anterior mediastinum, including on image 18. No well-defined adenopathy.  Upper Abdomen:  Normal adrenal glands.  Left nephrolithiasis.  Bones/Musculoskeletal: Remote presumed postsurgical defects within left-sided ribs.  IMPRESSION: 1. Surgical changes in the superior left hemi thorax, without locally recurrent or metastatic disease. 2. Resolved left-sided pleural effusion since 06/01/2012. 3.  Atherosclerosis, including within the coronary arteries. 4. Pulmonary artery  enlargement suggests pulmonary arterial hypertension. 5. Similar to minimal increase in bibasilar interstitial lung disease. Question nonspecific interstitial pneumonitis or is slightly atypical appearance of usual interstitial pneumonitis.   Electronically Signed   By: Abigail Miyamoto M.D.   On: 06/04/2013 12:37   ASSESSMENT AND PLAN: This is a very pleasant 73 years old [place] American female with history of stage IA non-small cell lung cancer, squamous cell carcinoma, status post left upper lobectomy with lymph node dissection. The patient is doing fine except for the seasonal allergy and postnasal drainage. She has no evidence for disease  recurrence on the recent scan. I discussed the scan results with the patient and her daughter. I recommended for her continuous observation with repeat CT scan of the chest without contrast in one year.  The patient was advised to call me immediately if she has any concerning symptoms in the interval.  All questions were answered. The patient knows to call the clinic with any problems, questions or concerns. We can certainly see the patient much sooner if necessary.  Disclaimer: This note was dictated with voice recognition software. Similar sounding words can inadvertently be transcribed and may not be corrected upon review.

## 2013-06-06 NOTE — Telephone Encounter (Signed)
gv adn printed aptps ched and avs for pt for April 2016

## 2013-06-18 ENCOUNTER — Encounter: Payer: Self-pay | Admitting: Internal Medicine

## 2013-06-27 ENCOUNTER — Encounter: Payer: Self-pay | Admitting: Internal Medicine

## 2013-06-27 ENCOUNTER — Ambulatory Visit (INDEPENDENT_AMBULATORY_CARE_PROVIDER_SITE_OTHER): Payer: Managed Care, Other (non HMO)

## 2013-06-27 ENCOUNTER — Encounter: Payer: Self-pay | Admitting: Family Medicine

## 2013-06-27 ENCOUNTER — Ambulatory Visit (INDEPENDENT_AMBULATORY_CARE_PROVIDER_SITE_OTHER): Payer: Managed Care, Other (non HMO) | Admitting: Family Medicine

## 2013-06-27 ENCOUNTER — Ambulatory Visit (INDEPENDENT_AMBULATORY_CARE_PROVIDER_SITE_OTHER): Payer: Managed Care, Other (non HMO) | Admitting: Internal Medicine

## 2013-06-27 VITALS — BP 130/68 | HR 64 | Wt 174.0 lb

## 2013-06-27 VITALS — BP 122/60 | HR 65 | Temp 98.6°F | Wt 177.4 lb

## 2013-06-27 DIAGNOSIS — R269 Unspecified abnormalities of gait and mobility: Secondary | ICD-10-CM

## 2013-06-27 DIAGNOSIS — J069 Acute upper respiratory infection, unspecified: Secondary | ICD-10-CM | POA: Insufficient documentation

## 2013-06-27 DIAGNOSIS — M25519 Pain in unspecified shoulder: Secondary | ICD-10-CM

## 2013-06-27 DIAGNOSIS — I5031 Acute diastolic (congestive) heart failure: Secondary | ICD-10-CM

## 2013-06-27 DIAGNOSIS — M25511 Pain in right shoulder: Secondary | ICD-10-CM

## 2013-06-27 DIAGNOSIS — I509 Heart failure, unspecified: Secondary | ICD-10-CM

## 2013-06-27 DIAGNOSIS — R531 Weakness: Secondary | ICD-10-CM

## 2013-06-27 DIAGNOSIS — R5381 Other malaise: Secondary | ICD-10-CM

## 2013-06-27 DIAGNOSIS — M75101 Unspecified rotator cuff tear or rupture of right shoulder, not specified as traumatic: Secondary | ICD-10-CM

## 2013-06-27 DIAGNOSIS — R5383 Other fatigue: Secondary | ICD-10-CM

## 2013-06-27 DIAGNOSIS — M12811 Other specific arthropathies, not elsewhere classified, right shoulder: Secondary | ICD-10-CM

## 2013-06-27 DIAGNOSIS — M19019 Primary osteoarthritis, unspecified shoulder: Secondary | ICD-10-CM

## 2013-06-27 MED ORDER — AZITHROMYCIN 250 MG PO TABS: ORAL_TABLET | ORAL | Status: DC

## 2013-06-27 NOTE — Assessment & Plan Note (Signed)
Patient was given injections as stated above. Patient tolerated the procedure well with near complete resolution of pain again. Patient encouraged to start exercising again in 3 times a week. Discuss when to get over-the-counter medications that can be beneficial as well as icing. Patient declined formal physical therapy. Patient will come back again in 3 weeks for further evaluation. Patient knows that we can repeat this injection every 3-4 months if necessary. Patient once again is not a surgical candidate for her rotator cuff tear.  Spent greater than 25 minutes with patient face-to-face and had greater than 50% of counseling including as described above in assessment and plan.

## 2013-06-27 NOTE — Progress Notes (Signed)
Pre visit review using our clinic review tool, if applicable. No additional management support is needed unless otherwise documented below in the visit note. 

## 2013-06-27 NOTE — Progress Notes (Signed)
Subjective:    Patient ID: [name], female    DOB: Sep 03, 1940, 73 y.o.   MRN: [id number]  HPI Here to f/u; overall doing ok,  Pt denies chest pain, increased sob or doe, wheezing, orthopnea, PND, palpitations, dizziness or syncope.  Pt denies polydipsia, polyuria, or low sugar symptoms such as weakness or confusion improved with po intake.  Pt denies new neurological symptoms such as new headache, or facial or extremity weakness or numbness.   Pt states overall good compliance with meds, has been trying to follow lower cholesterol diet, with wt overall stable,  but little exercise however, walks with cane in right hand, appears more unsteady than previous.  No falls but could more easily it seems. Has hx of Diast CHF, only taking the lasix 40 qd for now, afraid to take bid daily.  Right shoulder with repeat pain as seen per Dr Tamala Julian Oct 2014 - tx with steroid inj at that time Also, with 2-3 days acute onset fever, facial pain, pressure, headache, general weakness and malaise. Past Medical History  Diagnosis Date  . HYPERLIPIDEMIA 11/18/2006  . HYPERKALEMIA 02/05/2008  . ANEMIA-IRON DEFICIENCY 09/22/2008  . Anemia of other chronic disease 02/05/2008  . ANXIETY 06/03/2008  . DEPRESSION 04/28/2009  . HYPERTENSION 11/18/2006  . PERICARDITIS 01/03/2007  . AORTIC STENOSIS/ INSUFFICIENCY, NON-RHEUMATIC 12/03/2008  . SINUSITIS- ACUTE-NOS 11/20/2007  . SINUSITIS, CHRONIC 02/05/2008  . ALLERGIC RHINITIS 11/18/2006  . PNEUMONIA 05/02/2007  . C O P D 06/27/2008  . Stricture and stenosis of esophagus 11/14/2008  . GERD 01/03/2007  . BARRETTS ESOPHAGUS 11/14/2008  . DYSPEPSIA 03/23/2007  . PRURITUS 10/08/2008  . OSTEOARTHRITIS 11/18/2006  . OSTEOARTHRITIS, KNEE, LEFT 01/03/2007  . Cervicalgia 01/13/2009  . BACK PAIN 02/04/2009    is improved  . BURSITIS, LEFT HIP 04/18/2009  . OSTEOPOROSIS 11/18/2006  . INSOMNIA-SLEEP DISORDER-UNSPEC 06/03/2008  . FATIGUE 04/18/2009  . PERIPHERAL EDEMA 02/13/2009  . MURMUR  11/18/2006  . DYSPHAGIA UNSPECIFIED 03/23/2007  . Abdominal pain, generalized 03/01/2007  . Nonspecific (abnormal) findings on radiological and other examination of body structure 06/03/2008  . Tuberculin Test Reaction 11/18/2006  . Acute bronchitis 02/23/2010  . CHOLELITHIASIS 04/24/2010  . NEPHROLITHIASIS, HX OF 04/24/2010  . Polyarthralgia 06/09/2010  . Elevated sed rate 06/11/2010  . Positive ANA (antinuclear antibody) 06/11/2010  . Rheumatoid factor positive 06/11/2010  . Cancer     lung ca  . 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 SPONDYLOSIS, CERVICAL, WITH RADICULOPATHY 01/13/2009  . Cervical radiculopathy 09/28/2010  . Lumbar radiculopathy 09/28/2010  . RENAL INSUFFICIENCY 02/05/2008    function has improved.  . Transfusion history 09-01-12    4'14 tranfused x 2 units  . Bleeding nose 09-01-12    past hx. none recent after having surgery  . Wound healing, delayed 09-01-12    right abdominal area   Past Surgical History  Procedure Laterality Date  . Abdominal hysterectomy    . Tubal ligation    . Tumor removal  08/06/08    from lungs  . Anterior cervical decomp/discectomy fusion  01/15/2011    Procedure: ANTERIOR CERVICAL DECOMPRESSION/DISCECTOMY FUSION 2 LEVELS;  Surgeon: Cooper Render Pool;  Location: [facility];  Service: Neurosurgery;  Laterality: N/A;  Cervical Three-Four, Cervical Four-Five Anterior Cervical Decompression Fusion   . Colonscopy    . Nose surgery    . Laparoscopic appendectomy N/A 05/25/2012    Procedure: APPENDECTOMY LAPAROSCOPIC  CONVERTED TO  OPEN APPENDECTOMY, exploratory laparatomy;  Surgeon: Odis Hollingshead,  MD;  Location: [facility];  Service: General;  Laterality: N/A;  . Bowel resection N/A 05/25/2012    Procedure: SMALL BOWEL RESECTION;  Surgeon: Odis Hollingshead, MD;  Location: [facility];  Service: General;  Laterality: N/A;  . Laparoscopic lysis of adhesions N/A 05/25/2012    Procedure: LAPAROSCOPIC LYSIS OF ADHESIONS;  Surgeon: Odis Hollingshead, MD;  Location:  [facility];  Service: General;  Laterality: N/A;  . Appendectomy    . Cataract extraction, bilateral  09-01-12  . Debridement of abdominal wall abscess N/A 09/07/2012    Procedure:  ABDOMINAL WOUND IRRIGATION AND TWO LAYERED CLOSURE;  Surgeon: Odis Hollingshead, MD;  Location: [facility];  Service: General;  Laterality: N/A;    reports that she quit smoking about 7 years ago. Her smoking use included Cigarettes. She has a 60 pack-year smoking history. She has quit using smokeless tobacco. She reports that she does not drink alcohol or use illicit drugs. family history includes Cancer in her brother; Coronary artery disease in her other; Heart disease in her father; Hyperlipidemia in her other; Hypertension in her other. There is no history of Colon cancer, Esophageal cancer, Rectal cancer, or Stomach cancer. Allergies  Allergen Reactions  . Amoxicillin Itching  . Fentanyl Other (See Comments)    hallucinations  . Hydrocodone Other (See Comments)    ineffective  . Codeine Hives and Rash  . Penicillins Hives and Rash   Current Outpatient Prescriptions on File Prior to Visit  Medication Sig Dispense Refill  . albuterol (PROVENTIL HFA;VENTOLIN HFA) 108 (90 BASE) MCG/ACT inhaler Inhale 1-2 puffs into the lungs 4 (four) times daily as needed for wheezing or shortness of breath. For wheezing or shortness of breath.      Marland Kitchen alendronate (FOSAMAX) 70 MG tablet Take 1 tablet (70 mg total) by mouth every 7 (seven) days. Take with a full glass of water on an empty stomach. Patient takes on Mondays.  4 tablet  11  . ALPRAZolam (XANAX) 0.5 MG tablet Take 0.5 mg by mouth daily as needed for anxiety.      Marland Kitchen amLODipine (NORVASC) 5 MG tablet Take 1 tablet (5 mg total) by mouth daily.  90 tablet  3  . citalopram (CELEXA) 10 MG tablet Take 1 tablet (10 mg total) by mouth every morning.  90 tablet  3  . feeding supplement (ENSURE COMPLETE) LIQD Take 237 mLs by mouth daily.      . furosemide (LASIX) 40 MG tablet TAKE 1  TABLET BY MOUTH 2 TIMES DAILY  180 tablet  3  . metoprolol succinate (TOPROL-XL) 25 MG 24 hr tablet TAKE 1 TABLET (25 MG TOTAL) BY MOUTH EVERY MORNING.  90 tablet  1  . pantoprazole (PROTONIX) 40 MG tablet Take 40 mg by mouth daily.       . pravastatin (PRAVACHOL) 40 MG tablet TAKE 1 TABLET BY MOUTH EVERY DAY  90 tablet  3  . traMADol (ULTRAM) 50 MG tablet Take 1 tablet (50 mg total) by mouth every 8 (eight) hours as needed.  60 tablet  2  . zolpidem (AMBIEN) 5 MG tablet Take 1 tablet (5 mg total) by mouth at bedtime as needed. For sleep.  30 tablet  5   No current facility-administered medications on file prior to visit.   Review of Systems  Constitutional: Negative for unusual diaphoresis or other sweats  HENT: Negative for ringing in ear Eyes: Negative for double vision or worsening visual disturbance.  Respiratory: Negative for choking and  stridor.   Gastrointestinal: Negative for vomiting or other signifcant bowel change Genitourinary: Negative for hematuria or decreased urine volume.  Musculoskeletal: Negative for other MSK pain or swelling Skin: Negative for color change and worsening wound.  Neurological: Negative for tremors and numbness other than noted  Psychiatric/Behavioral: Negative for decreased concentration or agitation other than above       Objective:   Physical Exam BP 122/60  Pulse 65  Temp(Src) 98.6 F (37 C) (Oral)  Wt 177 lb 6.4 oz (80.468 kg)  SpO2 91% VS noted,  Constitutional: Pt appears well-developed, well-nourished.  HENT: Head: NCAT.  Right Ear: External ear normal.  Left Ear: External ear normal.  Eyes: . Pupils are equal, round, and reactive to light. Conjunctivae and EOM are normal Neck: Normal range of motion. Neck supple.  Cardiovascular: Normal rate and regular rhythm.   Bilat tm's with mild erythema.  Max sinus areas mild tender.  Pharynx with mild erythema, no exudate Pulmonary/Chest: Effort normal and breath sounds normal.  Abd:  Soft,  NT, ND, + BS Neurological: Pt is alert. Not confused , motor grossly intact but slow, unsteady, off balance, increased risk of fall Right shoulder with tender diffuse, decreased ROM Skin: Skin is warm. No rash LE 1+ edema to mid leg bilat Psychiatric: Pt behavior is normal. No agitation. but mild irritable today    Assessment & Plan:

## 2013-06-27 NOTE — Patient Instructions (Addendum)
Please take all new medication as prescribed - the antibiotic  You can take the lasix 40 mg in the AM every day, but also in the PM if you have persistent swelling to the lower legs  You will be contacted regarding the referral for: Home PT (with a nurse to assess your Heart situation)  Please continue all other medications as before, and refills have been done if requested. Please have the pharmacy call with any other refills you may need.  You are also given the compression stocking prescriptoin  You can see Dr Smith/sport medicine for your right shoulder at this time  Please keep your appointments with your specialists as you have planned - Dr Earlie Server  No further lab work needed at this time  Please return in 6 months, or sooner if needed

## 2013-06-27 NOTE — Assessment & Plan Note (Signed)
Mild to mod, for antibx course,  to f/u any worsening symptoms or concerns 

## 2013-06-27 NOTE — Assessment & Plan Note (Signed)
Ok for seeing Dr Tamala Julian today, sport med, cont same tx

## 2013-06-27 NOTE — Assessment & Plan Note (Signed)
For home PT

## 2013-06-27 NOTE — Progress Notes (Signed)
Followup right  shoulder pain  HPI: Patient is a 73 year old female with significant comorbidities and multiple medical problems coming in with followup of right shoulder pain. 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 Patient did have a corticosteroid injection back in October. This was 7 months ago. Patient states that she was doing very well until the last month. Started to have the discomfort again on a regular basis. Patient states that the pain is starting to wake her up again at night. Patient can only take so many different medications at this time. Patient states nothing seems to be relieving the pain. Patient did have near complete resolution of pain with the injection previously. Denies any new symptoms just re\re exacerbation of the previous symptoms..  Past medical, surgical, family and social history reviewed. Medications reviewed all in the electronic medical record.   Review of Systems: No headache, visual changes, nausea, vomiting, diarrhea, constipation, dizziness, abdominal pain, skin rash, fevers, chills, night sweats, weight loss, swollen lymph nodes, body aches, joint swelling, muscle aches, chest pain, shortness of breath, mood changes.   Objective:    Blood pressure 130/68, pulse 64, weight 174 lb (78.926 kg), SpO2 91.00%.   General: No apparent distress alert and oriented x3 mood and affect normal, dressed appropriately. Patient does have some increased kyphosis HEENT: Pupils equal, extraocular movements intact Respiratory: Patient's speak in full sentences and does not appear short of breath Cardiovascular: No lower extremity edema, non tender, no erythema Skin: Warm dry intact with no signs of infection or rash on extremities or on axial skeleton. Abdomen: Soft nontender Neuro: Cranial nerves II through XII are intact, neurovascularly intact in all extremities with 2+ DTRs and 2+ pulses. Lymph: No  lymphadenopathy of posterior or anterior cervical chain or axillae bilaterally.  Gait patient does walk with a cane but does have good balance  MSK: Non tender with full range of motion and good stability and symmetric strength and tone of, elbows, wrist, hip, knee and ankles bilaterally.  Shoulder: Right Inspection reveals no abnormalities, atrophy or asymmetry. Palpation is normal with no tenderness over AC joint or bicipital groove. Patient is only able to do active forward flexion to 90, abduction to 80, internal rotation to hip. Contralateral side has full range of motion The patient's rotator cuff strength is 4/5 with positive drop arm sign. Patient has 5 out of 5 strength of the contralateral side Positive painful arc Positive empty can Speeds and Yergason's tests normal. No labral pathology noted with negative Obrien's, negative clunk and good stability. Normal scapular function observed.  Procedure: Real-time Ultrasound Guided Injection of right glenohumeral joint Device: GE Logiq E  Ultrasound guided injection is preferred based studies that show increased duration, increased effect, greater accuracy, decreased procedural pain, increased response rate with ultrasound guided versus blind injection.  Verbal informed consent obtained.  Time-out conducted.  Noted no overlying erythema, induration, or other signs of local infection.  Skin prepped in a sterile fashion.  Local anesthesia: Topical Ethyl chloride.  With sterile technique and under real time ultrasound guidance:  Joint visualized.  23g 1  inch needle inserted posterior approach. Pictures taken for needle placement. Patient did have injection of 2 cc of 1% lidocaine, 2 cc of 0.5% Marcaine, and 1.0 cc of Kenalog 40 mg/dL. Completed without difficulty  Pain immediately resolved suggesting accurate placement of the medication.  Advised to call if fevers/chills, erythema, induration, drainage, or persistent bleeding.  Images  permanently  stored and available for review in the ultrasound unit.  Impression: Technically successful ultrasound guided injection.    Impression and Recommendations:     This case required medical decision making of moderate complexity.

## 2013-06-27 NOTE — Assessment & Plan Note (Signed)
D/w pt recent labs, ok for no further labs at this time, for Home PT

## 2013-06-27 NOTE — Patient Instructions (Signed)
Good to see you again  Ice bath 10 minutes daily can help with some of the swelling Injection in your shoulder today can help Spenco orthotics at [employer] or online If getting new shoes, Bronwen Betters dansko or New balance are my favorite.  Rigid sole shoes is what will help.  Vitamin D 2000 IU daily Exercises for shoulder 3 times a week.  See me again in 3 weeks.

## 2013-06-27 NOTE — Assessment & Plan Note (Addendum)
Mild volume overload today, to increase the lasix to 40qd, with bid dosing for days with persistent LE edema, also for compression stockings, low salt  Note:  Total time for pt hx, exam, review of record with pt in the room, determination of diagnoses and plan for further eval and tx is > 40 min, with over 50% spent in coordination and counseling of patient

## 2013-07-11 ENCOUNTER — Telehealth: Payer: Self-pay

## 2013-07-11 NOTE — Telephone Encounter (Signed)
FYI.. Donnie, OT with [facility] called to advise that he went out on a home visit to see patient and will see her 1wk x 1, 2wk x 1,1wk 2. Thaks

## 2013-07-13 DIAGNOSIS — J069 Acute upper respiratory infection, unspecified: Secondary | ICD-10-CM

## 2013-07-13 DIAGNOSIS — R269 Unspecified abnormalities of gait and mobility: Secondary | ICD-10-CM

## 2013-07-13 DIAGNOSIS — R5381 Other malaise: Secondary | ICD-10-CM

## 2013-07-13 DIAGNOSIS — R5383 Other fatigue: Secondary | ICD-10-CM

## 2013-07-13 DIAGNOSIS — I509 Heart failure, unspecified: Secondary | ICD-10-CM

## 2013-07-31 ENCOUNTER — Telehealth (INDEPENDENT_AMBULATORY_CARE_PROVIDER_SITE_OTHER): Payer: Self-pay

## 2013-07-31 ENCOUNTER — Telehealth: Payer: Self-pay

## 2013-07-31 NOTE — Telephone Encounter (Signed)
Occupational therapist from [facility] states that the pt has been d/c from the service.

## 2013-07-31 NOTE — Telephone Encounter (Signed)
Pt's daughter calling today stating that she has noticed that her mother's abdomen has become more swollen in the last 2 weeks. Pt had open appy 05/25/12. Pt denies any n/v, fevers or chills. Informed daughter to give Dr Gwynn Burly office (her PCP) a call to inform them of her concerns also. Informed pt this may not be due to her surgeries, however I would send the message to Dr Zella Richer and his assistant. Pt verbalized understanding and agrees with POC.

## 2013-08-01 ENCOUNTER — Encounter: Payer: Self-pay | Admitting: Internal Medicine

## 2013-08-01 ENCOUNTER — Ambulatory Visit (INDEPENDENT_AMBULATORY_CARE_PROVIDER_SITE_OTHER): Payer: Managed Care, Other (non HMO) | Admitting: Internal Medicine

## 2013-08-01 VITALS — BP 130/72 | HR 64 | Temp 98.2°F | Wt 171.8 lb

## 2013-08-01 DIAGNOSIS — I10 Essential (primary) hypertension: Secondary | ICD-10-CM

## 2013-08-01 DIAGNOSIS — R339 Retention of urine, unspecified: Secondary | ICD-10-CM | POA: Insufficient documentation

## 2013-08-01 DIAGNOSIS — N189 Chronic kidney disease, unspecified: Secondary | ICD-10-CM

## 2013-08-01 LAB — POCT URINALYSIS DIPSTICK
BILIRUBIN UA: NEGATIVE
GLUCOSE UA: NEGATIVE
KETONES UA: NEGATIVE
NITRITE UA: NEGATIVE
Protein, UA: NEGATIVE
RBC UA: NEGATIVE
Spec Grav, UA: 1.015
Urobilinogen, UA: NEGATIVE
pH, UA: 6

## 2013-08-01 MED ORDER — TAMSULOSIN HCL 0.4 MG PO CAPS: 0.4000 mg | ORAL_CAPSULE | Freq: Every day | ORAL | Status: DC

## 2013-08-01 MED ORDER — CIPROFLOXACIN HCL 500 MG PO TABS: 500.0000 mg | ORAL_TABLET | Freq: Two times a day (BID) | ORAL | Status: DC

## 2013-08-01 NOTE — Progress Notes (Signed)
Pre visit review using our clinic review tool, if applicable. No additional management support is needed unless otherwise documented below in the visit note. 

## 2013-08-01 NOTE — Assessment & Plan Note (Addendum)
I suspect urinary retention, mild to mod, etiology unclear, doubt med side effect realted, cant r/o infection, so for empiric flomax, cipro bid course, and urine studies as she is able to urinate some;  Consider other such as bladder scan, abd u/s or CT and labs but pt in NAD at this time, denies pain and does not want extensive eval at this time.  Will ask to return in 2 days for re-assessment.

## 2013-08-01 NOTE — Progress Notes (Signed)
Subjective:    Patient ID: [name], female    DOB: [birth year], 73 y.o.   MRN: [id number]  HPI  Here with daughter who checked on her last evening and noted her abd quite swollen; pt herself appear in NAD, denies signficant pain, has some urinary slowing with urination but o/w Denies urinary symptoms such as dysuria, frequency, urgency, flank pain, hematuria or n/v, fever, chills. No worsening back pain , and Denies worsening reflux, abd pain, dysphagia, n/v, bowel change or blood.  S/p recent appy April 2014, no prior hx of adhesion problem Past Medical History  Diagnosis Date  . HYPERLIPIDEMIA 11/18/2006  . HYPERKALEMIA 02/05/2008  . ANEMIA-IRON DEFICIENCY 09/22/2008  . Anemia of other chronic disease 02/05/2008  . ANXIETY 06/03/2008  . DEPRESSION 04/28/2009  . HYPERTENSION 11/18/2006  . PERICARDITIS 01/03/2007  . AORTIC STENOSIS/ INSUFFICIENCY, NON-RHEUMATIC 12/03/2008  . SINUSITIS- ACUTE-NOS 11/20/2007  . SINUSITIS, CHRONIC 02/05/2008  . ALLERGIC RHINITIS 11/18/2006  . PNEUMONIA 05/02/2007  . C O P D 06/27/2008  . Stricture and stenosis of esophagus 11/14/2008  . GERD 01/03/2007  . BARRETTS ESOPHAGUS 11/14/2008  . DYSPEPSIA 03/23/2007  . PRURITUS 10/08/2008  . OSTEOARTHRITIS 11/18/2006  . OSTEOARTHRITIS, KNEE, LEFT 01/03/2007  . Cervicalgia 01/13/2009  . BACK PAIN 02/04/2009    is improved  . BURSITIS, LEFT HIP 04/18/2009  . OSTEOPOROSIS 11/18/2006  . INSOMNIA-SLEEP DISORDER-UNSPEC 06/03/2008  . FATIGUE 04/18/2009  . PERIPHERAL EDEMA 02/13/2009  . MURMUR 11/18/2006  . DYSPHAGIA UNSPECIFIED 03/23/2007  . Abdominal pain, generalized 03/01/2007  . Nonspecific (abnormal) findings on radiological and other examination of body structure 06/03/2008  . Tuberculin Test Reaction 11/18/2006  . Acute bronchitis 02/23/2010  . CHOLELITHIASIS 04/24/2010  . NEPHROLITHIASIS, HX OF 04/24/2010  . Polyarthralgia 06/09/2010  . Elevated sed rate 06/11/2010  . Positive ANA (antinuclear antibody) 06/11/2010  .  Rheumatoid factor positive 06/11/2010  . Cancer     lung ca  . 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 SPONDYLOSIS, CERVICAL, WITH RADICULOPATHY 01/13/2009  . Cervical radiculopathy 09/28/2010  . Lumbar radiculopathy 09/28/2010  . RENAL INSUFFICIENCY 02/05/2008    function has improved.  . Transfusion history 09-01-12    4'14 tranfused x 2 units  . Bleeding nose 09-01-12    past hx. none recent after having surgery  . Wound healing, delayed 09-01-12    right abdominal area   Past Surgical History  Procedure Laterality Date  . Abdominal hysterectomy    . Tubal ligation    . Tumor removal  08/06/08    from lungs  . Anterior cervical decomp/discectomy fusion  01/15/2011    Procedure: ANTERIOR CERVICAL DECOMPRESSION/DISCECTOMY FUSION 2 LEVELS;  Surgeon: Cooper Render Pool;  Location: [facility];  Service: Neurosurgery;  Laterality: N/A;  Cervical Three-Four, Cervical Four-Five Anterior Cervical Decompression Fusion   . Colonscopy    . Nose surgery    . Laparoscopic appendectomy N/A 05/25/2012    Procedure: APPENDECTOMY LAPAROSCOPIC  CONVERTED TO  OPEN APPENDECTOMY, exploratory laparatomy;  Surgeon: Odis Hollingshead, MD;  Location: [facility];  Service: General;  Laterality: N/A;  . Bowel resection N/A 05/25/2012    Procedure: SMALL BOWEL RESECTION;  Surgeon: Odis Hollingshead, MD;  Location: [facility];  Service: General;  Laterality: N/A;  . Laparoscopic lysis of adhesions N/A 05/25/2012    Procedure: LAPAROSCOPIC LYSIS OF ADHESIONS;  Surgeon: Odis Hollingshead, MD;  Location: [facility];  Service: General;  Laterality: N/A;  . Appendectomy    . Cataract extraction,  bilateral  09-01-12  . Debridement of abdominal wall abscess N/A 09/07/2012    Procedure:  ABDOMINAL WOUND IRRIGATION AND TWO LAYERED CLOSURE;  Surgeon: Odis Hollingshead, MD;  Location: [facility];  Service: General;  Laterality: N/A;    reports that she quit smoking about 7 years ago. Her smoking use included Cigarettes. She has a 60 pack-year  smoking history. She has quit using smokeless tobacco. She reports that she does not drink alcohol or use illicit drugs. family history includes Cancer in her brother; Coronary artery disease in her other; Heart disease in her father; Hyperlipidemia in her other; Hypertension in her other. There is no history of Colon cancer, Esophageal cancer, Rectal cancer, or Stomach cancer. Allergies  Allergen Reactions  . Amoxicillin Itching  . Fentanyl Other (See Comments)    hallucinations  . Hydrocodone Other (See Comments)    ineffective  . Codeine Hives and Rash  . Penicillins Hives and Rash   Current Outpatient Prescriptions on File Prior to Visit  Medication Sig Dispense Refill  . albuterol (PROVENTIL HFA;VENTOLIN HFA) 108 (90 BASE) MCG/ACT inhaler Inhale 1-2 puffs into the lungs 4 (four) times daily as needed for wheezing or shortness of breath. For wheezing or shortness of breath.      Marland Kitchen alendronate (FOSAMAX) 70 MG tablet Take 1 tablet (70 mg total) by mouth every 7 (seven) days. Take with a full glass of water on an empty stomach. Patient takes on Mondays.  4 tablet  11  . ALPRAZolam (XANAX) 0.5 MG tablet Take 0.5 mg by mouth daily as needed for anxiety.      Marland Kitchen amLODipine (NORVASC) 5 MG tablet Take 1 tablet (5 mg total) by mouth daily.  90 tablet  3  . citalopram (CELEXA) 10 MG tablet Take 1 tablet (10 mg total) by mouth every morning.  90 tablet  3  . feeding supplement (ENSURE COMPLETE) LIQD Take 237 mLs by mouth daily.      . furosemide (LASIX) 40 MG tablet TAKE 1 TABLET BY MOUTH 2 TIMES DAILY  180 tablet  3  . metoprolol succinate (TOPROL-XL) 25 MG 24 hr tablet TAKE 1 TABLET (25 MG TOTAL) BY MOUTH EVERY MORNING.  90 tablet  1  . pantoprazole (PROTONIX) 40 MG tablet Take 40 mg by mouth daily.       . pravastatin (PRAVACHOL) 40 MG tablet TAKE 1 TABLET BY MOUTH EVERY DAY  90 tablet  3  . traMADol (ULTRAM) 50 MG tablet Take 1 tablet (50 mg total) by mouth every 8 (eight) hours as needed.  60  tablet  2  . zolpidem (AMBIEN) 5 MG tablet Take 1 tablet (5 mg total) by mouth at bedtime as needed. For sleep.  30 tablet  5   No current facility-administered medications on file prior to visit.   Review of Systems  Constitutional: Negative for unusual diaphoresis or other sweats  HENT: Negative for ringing in ear Eyes: Negative for double vision or worsening visual disturbance.  Respiratory: Negative for choking and stridor.   Gastrointestinal: Negative for vomiting or other signifcant bowel change Genitourinary: Negative for hematuria or decreased urine volume.  Musculoskeletal: Negative for other MSK pain or swelling Skin: Negative for color change and worsening wound.  Neurological: Negative for tremors and numbness other than noted  Psychiatric/Behavioral: Negative for decreased concentration or agitation other than above       Objective:   Physical Exam BP 130/72  Pulse 64  Temp(Src) 98.2 F (36.8 C) (Oral)  Wt 171 lb 12 oz (77.905 kg)  SpO2 91% VS noted,  Constitutional: Pt appears well-developed, well-nourished.  HENT: Head: NCAT.  Right Ear: External ear normal.  Left Ear: External ear normal.  Eyes: . Pupils are equal, round, and reactive to light. Conjunctivae and EOM are normal Neck: Normal range of motion. Neck supple.  Cardiovascular: Normal rate and regular rhythm.   Pulmonary/Chest: Effort normal and breath sounds normal.  Abd:  Soft, + BS, as unusual lower mid abd nondiscrete firm swelling /distension - I suspect distended bladder with mild tender mid abd below the umbilicus, no guarding or rebound Neurological: Pt is alert. Not confused , motor grossly intact Skin: Skin is warm. No rash Psychiatric: Pt behavior is normal. No agitation.      Assessment & Plan:

## 2013-08-01 NOTE — Assessment & Plan Note (Signed)
Volume stable, consider labs for persistent/worsening s/s, declines blood labs at this time

## 2013-08-01 NOTE — Patient Instructions (Signed)
Please take all new medication as prescribed  - the flomax to help "relax the bladder" to help let the urine out more  Please take all new medication as prescribed - the antibiotic  The specimen will be sent for analysis and culture, but the results take 2-3 days to return  Please return on Friday Aug 03, 2013 for re-check

## 2013-08-01 NOTE — Assessment & Plan Note (Signed)
stable overall by history and exam, recent data reviewed with pt, and pt to continue medical treatment as before,  to f/u any worsening symptoms or concerns BP Readings from Last 3 Encounters:  08/01/13 130/72  06/27/13 130/68  06/27/13 122/60

## 2013-08-02 ENCOUNTER — Other Ambulatory Visit (INDEPENDENT_AMBULATORY_CARE_PROVIDER_SITE_OTHER): Payer: Managed Care, Other (non HMO)

## 2013-08-02 DIAGNOSIS — R339 Retention of urine, unspecified: Secondary | ICD-10-CM

## 2013-08-02 DIAGNOSIS — Z0000 Encounter for general adult medical examination without abnormal findings: Secondary | ICD-10-CM

## 2013-08-02 LAB — URINALYSIS, ROUTINE W REFLEX MICROSCOPIC
BILIRUBIN URINE: NEGATIVE
HGB URINE DIPSTICK: NEGATIVE
Ketones, ur: NEGATIVE
NITRITE: NEGATIVE
Specific Gravity, Urine: 1.01 (ref 1.000–1.030)
Total Protein, Urine: NEGATIVE
Urine Glucose: NEGATIVE
Urobilinogen, UA: 0.2 (ref 0.0–1.0)
pH: 6 (ref 5.0–8.0)

## 2013-08-03 ENCOUNTER — Other Ambulatory Visit (INDEPENDENT_AMBULATORY_CARE_PROVIDER_SITE_OTHER): Payer: Managed Care, Other (non HMO)

## 2013-08-03 ENCOUNTER — Ambulatory Visit (INDEPENDENT_AMBULATORY_CARE_PROVIDER_SITE_OTHER): Payer: Managed Care, Other (non HMO) | Admitting: Internal Medicine

## 2013-08-03 ENCOUNTER — Ambulatory Visit (INDEPENDENT_AMBULATORY_CARE_PROVIDER_SITE_OTHER)
Admission: RE | Admit: 2013-08-03 | Discharge: 2013-08-03 | Disposition: A | Discharge status: Home / Self Care | Payer: Managed Care, Other (non HMO) | Source: Ambulatory Visit | Attending: Internal Medicine | Admitting: Internal Medicine

## 2013-08-03 ENCOUNTER — Encounter: Payer: Self-pay | Admitting: Internal Medicine

## 2013-08-03 VITALS — BP 140/80 | HR 67 | Temp 98.3°F | Wt 170.1 lb

## 2013-08-03 DIAGNOSIS — R339 Retention of urine, unspecified: Secondary | ICD-10-CM

## 2013-08-03 DIAGNOSIS — N183 Chronic kidney disease, stage 3 unspecified: Secondary | ICD-10-CM

## 2013-08-03 DIAGNOSIS — R141 Gas pain: Secondary | ICD-10-CM

## 2013-08-03 DIAGNOSIS — R142 Eructation: Secondary | ICD-10-CM

## 2013-08-03 DIAGNOSIS — R140 Abdominal distension (gaseous): Secondary | ICD-10-CM

## 2013-08-03 DIAGNOSIS — R10819 Abdominal tenderness, unspecified site: Secondary | ICD-10-CM

## 2013-08-03 DIAGNOSIS — R143 Flatulence: Secondary | ICD-10-CM

## 2013-08-03 LAB — CBC WITH DIFFERENTIAL/PLATELET
BASOS ABS: 0 10*3/uL (ref 0.0–0.1)
Basophils Relative: 0.4 % (ref 0.0–3.0)
EOS PCT: 1.6 % (ref 0.0–5.0)
Eosinophils Absolute: 0.1 10*3/uL (ref 0.0–0.7)
HCT: 37.9 % (ref 36.0–46.0)
Hemoglobin: 12.2 g/dL (ref 12.0–15.0)
LYMPHS ABS: 1.2 10*3/uL (ref 0.7–4.0)
LYMPHS PCT: 23.2 % (ref 12.0–46.0)
MCHC: 32.2 g/dL (ref 30.0–36.0)
MCV: 98 fl (ref 78.0–100.0)
MONOS PCT: 10.6 % (ref 3.0–12.0)
Monocytes Absolute: 0.6 10*3/uL (ref 0.1–1.0)
NEUTROS PCT: 64.2 % (ref 43.0–77.0)
Neutro Abs: 3.4 10*3/uL (ref 1.4–7.7)
Platelets: 216 10*3/uL (ref 150.0–400.0)
RBC: 3.87 Mil/uL (ref 3.87–5.11)
RDW: 14.4 % (ref 11.5–15.5)
WBC: 5.4 10*3/uL (ref 4.0–10.5)

## 2013-08-03 LAB — BASIC METABOLIC PANEL
BUN: 26 mg/dL — ABNORMAL HIGH (ref 6–23)
CALCIUM: 9.2 mg/dL (ref 8.4–10.5)
CO2: 28 meq/L (ref 19–32)
Chloride: 102 mEq/L (ref 96–112)
Creatinine, Ser: 2.3 mg/dL — ABNORMAL HIGH (ref 0.4–1.2)
GFR: 27.13 mL/min — ABNORMAL LOW (ref >60.00)
GLUCOSE: 91 mg/dL (ref 70–99)
POTASSIUM: 4 meq/L (ref 3.5–5.1)
Sodium: 140 mEq/L (ref 135–145)

## 2013-08-03 LAB — LIPASE: Lipase: 27 U/L (ref 11.0–59.0)

## 2013-08-03 LAB — HEPATIC FUNCTION PANEL
ALK PHOS: 46 U/L (ref 39–117)
ALT: 15 U/L (ref 0–35)
AST: 15 U/L (ref 0–37)
Albumin: 3.8 g/dL (ref 3.5–5.2)
Bilirubin, Direct: 0.1 mg/dL (ref 0.0–0.3)
TOTAL PROTEIN: 7.2 g/dL (ref 6.0–8.3)
Total Bilirubin: 0.5 mg/dL (ref 0.2–1.2)

## 2013-08-03 LAB — URINE CULTURE
Colony Count: NO GROWTH
ORGANISM ID, BACTERIA: NO GROWTH

## 2013-08-03 NOTE — Progress Notes (Signed)
Subjective:    Patient ID: [name], female    DOB: 1940-06-16, 73 y.o.   MRN: [id number]  HPI  Here with 2 day f/u, unfort no improvement in urinary flow per pt with flomax, and UA neg for UTI. Has taken cipro x 2 days but Denies urinary symptoms such as dysuria, frequency, urgency, flank pain, hematuria or n/v, fever, chills.  Her abd distension has worsened visibly, but still curiously with little other symptoms, no n/v, fever, worsening pain, states had normal BM today, passing gas, no overt bleeding.  Has some tenderness to touching the stomach but o/w not overly concerned.   Has tramadol at home but only took one dose this am b/c she was trying to prevent pain with a blood draw. Past Medical History  Diagnosis Date  . HYPERLIPIDEMIA 11/18/2006  . HYPERKALEMIA 02/05/2008  . ANEMIA-IRON DEFICIENCY 09/22/2008  . Anemia of other chronic disease 02/05/2008  . ANXIETY 06/03/2008  . DEPRESSION 04/28/2009  . HYPERTENSION 11/18/2006  . PERICARDITIS 01/03/2007  . AORTIC STENOSIS/ INSUFFICIENCY, NON-RHEUMATIC 12/03/2008  . SINUSITIS- ACUTE-NOS 11/20/2007  . SINUSITIS, CHRONIC 02/05/2008  . ALLERGIC RHINITIS 11/18/2006  . PNEUMONIA 05/02/2007  . C O P D 06/27/2008  . Stricture and stenosis of esophagus 11/14/2008  . GERD 01/03/2007  . BARRETTS ESOPHAGUS 11/14/2008  . DYSPEPSIA 03/23/2007  . PRURITUS 10/08/2008  . OSTEOARTHRITIS 11/18/2006  . OSTEOARTHRITIS, KNEE, LEFT 01/03/2007  . Cervicalgia 01/13/2009  . BACK PAIN 02/04/2009    is improved  . BURSITIS, LEFT HIP 04/18/2009  . OSTEOPOROSIS 11/18/2006  . INSOMNIA-SLEEP DISORDER-UNSPEC 06/03/2008  . FATIGUE 04/18/2009  . PERIPHERAL EDEMA 02/13/2009  . MURMUR 11/18/2006  . DYSPHAGIA UNSPECIFIED 03/23/2007  . Abdominal pain, generalized 03/01/2007  . Nonspecific (abnormal) findings on radiological and other examination of body structure 06/03/2008  . Tuberculin Test Reaction 11/18/2006  . Acute bronchitis 02/23/2010  . CHOLELITHIASIS 04/24/2010  .  NEPHROLITHIASIS, HX OF 04/24/2010  . Polyarthralgia 06/09/2010  . Elevated sed rate 06/11/2010  . Positive ANA (antinuclear antibody) 06/11/2010  . Rheumatoid factor positive 06/11/2010  . Cancer     lung ca  . 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 SPONDYLOSIS, CERVICAL, WITH RADICULOPATHY 01/13/2009  . Cervical radiculopathy 09/28/2010  . Lumbar radiculopathy 09/28/2010  . RENAL INSUFFICIENCY 02/05/2008    function has improved.  . Transfusion history 09-01-12    4'14 tranfused x 2 units  . Bleeding nose 09-01-12    past hx. none recent after having surgery  . Wound healing, delayed 09-01-12    right abdominal area   Past Surgical History  Procedure Laterality Date  . Abdominal hysterectomy    . Tubal ligation    . Tumor removal  08/06/08    from lungs  . Anterior cervical decomp/discectomy fusion  01/15/2011    Procedure: ANTERIOR CERVICAL DECOMPRESSION/DISCECTOMY FUSION 2 LEVELS;  Surgeon: Cooper Render Pool;  Location: [facility];  Service: Neurosurgery;  Laterality: N/A;  Cervical Three-Four, Cervical Four-Five Anterior Cervical Decompression Fusion   . Colonscopy    . Nose surgery    . Laparoscopic appendectomy N/A 05/25/2012    Procedure: APPENDECTOMY LAPAROSCOPIC  CONVERTED TO  OPEN APPENDECTOMY, exploratory laparatomy;  Surgeon: Odis Hollingshead, MD;  Location: [facility];  Service: General;  Laterality: N/A;  . Bowel resection N/A 05/25/2012    Procedure: SMALL BOWEL RESECTION;  Surgeon: Odis Hollingshead, MD;  Location: [facility];  Service: General;  Laterality: N/A;  . Laparoscopic lysis of adhesions N/A 05/25/2012  Procedure: LAPAROSCOPIC LYSIS OF ADHESIONS;  Surgeon: Odis Hollingshead, MD;  Location: [facility];  Service: General;  Laterality: N/A;  . Appendectomy    . Cataract extraction, bilateral  09-01-12  . Debridement of abdominal wall abscess N/A 09/07/2012    Procedure:  ABDOMINAL WOUND IRRIGATION AND TWO LAYERED CLOSURE;  Surgeon: Odis Hollingshead, MD;  Location: [facility];   Service: General;  Laterality: N/A;    reports that she quit smoking about 7 years ago. Her smoking use included Cigarettes. She has a 60 pack-year smoking history. She has quit using smokeless tobacco. She reports that she does not drink alcohol or use illicit drugs. family history includes Cancer in her brother; Coronary artery disease in her other; Heart disease in her father; Hyperlipidemia in her other; Hypertension in her other. There is no history of Colon cancer, Esophageal cancer, Rectal cancer, or Stomach cancer. Allergies  Allergen Reactions  . Amoxicillin Itching  . Fentanyl Other (See Comments)    hallucinations  . Hydrocodone Other (See Comments)    ineffective  . Codeine Hives and Rash  . Penicillins Hives and Rash   Current Outpatient Prescriptions on File Prior to Visit  Medication Sig Dispense Refill  . albuterol (PROVENTIL HFA;VENTOLIN HFA) 108 (90 BASE) MCG/ACT inhaler Inhale 1-2 puffs into the lungs 4 (four) times daily as needed for wheezing or shortness of breath. For wheezing or shortness of breath.      Marland Kitchen alendronate (FOSAMAX) 70 MG tablet Take 1 tablet (70 mg total) by mouth every 7 (seven) days. Take with a full glass of water on an empty stomach. Patient takes on Mondays.  4 tablet  11  . ALPRAZolam (XANAX) 0.5 MG tablet Take 0.5 mg by mouth daily as needed for anxiety.      Marland Kitchen amLODipine (NORVASC) 5 MG tablet Take 1 tablet (5 mg total) by mouth daily.  90 tablet  3  . ciprofloxacin (CIPRO) 500 MG tablet Take 1 tablet (500 mg total) by mouth 2 (two) times daily.  20 tablet  0  . citalopram (CELEXA) 10 MG tablet Take 1 tablet (10 mg total) by mouth every morning.  90 tablet  3  . feeding supplement (ENSURE COMPLETE) LIQD Take 237 mLs by mouth daily.      . furosemide (LASIX) 40 MG tablet TAKE 1 TABLET BY MOUTH 2 TIMES DAILY  180 tablet  3  . metoprolol succinate (TOPROL-XL) 25 MG 24 hr tablet TAKE 1 TABLET (25 MG TOTAL) BY MOUTH EVERY MORNING.  90 tablet  1  .  pantoprazole (PROTONIX) 40 MG tablet Take 40 mg by mouth daily.       . pravastatin (PRAVACHOL) 40 MG tablet TAKE 1 TABLET BY MOUTH EVERY DAY  90 tablet  3  . tamsulosin (FLOMAX) 0.4 MG CAPS capsule Take 1 capsule (0.4 mg total) by mouth daily.  30 capsule  5  . traMADol (ULTRAM) 50 MG tablet Take 1 tablet (50 mg total) by mouth every 8 (eight) hours as needed.  60 tablet  2  . zolpidem (AMBIEN) 5 MG tablet Take 1 tablet (5 mg total) by mouth at bedtime as needed. For sleep.  30 tablet  5   No current facility-administered medications on file prior to visit.   Review of Systems  Constitutional: Negative for unusual diaphoresis or other sweats  HENT: Negative for ringing in ear Eyes: Negative for double vision or worsening visual disturbance.  Respiratory: Negative for choking and stridor.   Gastrointestinal: Negative  for vomiting or other signifcant bowel change Genitourinary: Negative for hematuria .  Musculoskeletal: Negative for other MSK pain or swelling Skin: Negative for color change and worsening wound.  Neurological: Negative for tremors and numbness other than noted  Psychiatric/Behavioral: Negative for decreased concentration or agitation other than above       Objective:   Physical Exam BP 140/80  Pulse 67  Temp(Src) 98.3 F (36.8 C) (Oral)  Wt 170 lb 2 oz (77.168 kg)  SpO2 94% VS noted,  Constitutional: Pt appears well-developed, well-nourished.  HENT: Head: NCAT.  Right Ear: External ear normal.  Left Ear: External ear normal.  Eyes: . Pupils are equal, round, and reactive to light. Conjunctivae and EOM are normal Neck: Normal range of motion. Neck supple.  Cardiovascular: Normal rate and regular rhythm.   Pulmonary/Chest: Effort normal and breath sounds normal.  Abd:  Soft, + BS, with worsenig lower bilat distension, tender mild to mod in the midline primarily, unable to palpate bladder or specific mass, no fluid shift able to be appreicated, no guarding or  rebound Neurological: Pt is alert. Not confused , motor grossly intact Skin: Skin is warm. No rash Psychiatric: Pt behavior is normal. No agitation.     Assessment & Plan:

## 2013-08-03 NOTE — Patient Instructions (Signed)
OK to stop the cipro  Please continue all other medications as before, and refills have been done if requested.  Please have the pharmacy call with any other refills you may need.  Please keep your appointments with your specialists as you may have planned  Please go to the LAB in the Basement (turn left off the elevator) for the tests to be done Now  Please return to see Stanton Kidney, the [facility], to have the CT abd/pelvis scheduled today  Further recommendations will need to be made after the CT results are known

## 2013-08-03 NOTE — Progress Notes (Signed)
Pre visit review using our clinic review tool, if applicable. No additional management support is needed unless otherwise documented below in the visit note. 

## 2013-08-04 ENCOUNTER — Other Ambulatory Visit: Payer: Self-pay | Admitting: Internal Medicine

## 2013-08-04 DIAGNOSIS — K439 Ventral hernia without obstruction or gangrene: Secondary | ICD-10-CM

## 2013-08-05 DIAGNOSIS — R140 Abdominal distension (gaseous): Secondary | ICD-10-CM | POA: Insufficient documentation

## 2013-08-05 NOTE — Telephone Encounter (Signed)
Noted and agreed with plan.

## 2013-08-05 NOTE — Assessment & Plan Note (Signed)
For f/u labs today, avoid contrast and nephrotoxins, for renal f/u as planned

## 2013-08-05 NOTE — Assessment & Plan Note (Signed)
Some urinary slowing, ? Clinical significnance, cont flomax for now

## 2013-08-05 NOTE — Assessment & Plan Note (Signed)
Unclear etiology, worsening in past 2 days, curiously no pain, mild tender lower mid abd only, exam o/w benign, recent UA neg - to d/c cipro, for stat labs and ct abd/oral contrast only given CKD, further eval pending results

## 2013-08-14 ENCOUNTER — Other Ambulatory Visit: Payer: Self-pay | Admitting: Internal Medicine

## 2013-08-20 ENCOUNTER — Other Ambulatory Visit: Payer: Self-pay | Admitting: Internal Medicine

## 2013-08-21 ENCOUNTER — Ambulatory Visit: Payer: Managed Care, Other (non HMO) | Admitting: Internal Medicine

## 2013-08-24 ENCOUNTER — Encounter: Payer: Self-pay | Admitting: Internal Medicine

## 2013-08-24 ENCOUNTER — Ambulatory Visit (INDEPENDENT_AMBULATORY_CARE_PROVIDER_SITE_OTHER): Payer: Managed Care, Other (non HMO) | Admitting: Internal Medicine

## 2013-08-24 VITALS — BP 132/80 | HR 71 | Temp 98.3°F | Wt 171.0 lb

## 2013-08-24 DIAGNOSIS — F3289 Other specified depressive episodes: Secondary | ICD-10-CM

## 2013-08-24 DIAGNOSIS — R339 Retention of urine, unspecified: Secondary | ICD-10-CM

## 2013-08-24 DIAGNOSIS — K439 Ventral hernia without obstruction or gangrene: Secondary | ICD-10-CM

## 2013-08-24 DIAGNOSIS — F329 Major depressive disorder, single episode, unspecified: Secondary | ICD-10-CM

## 2013-08-24 DIAGNOSIS — I10 Essential (primary) hypertension: Secondary | ICD-10-CM

## 2013-08-24 NOTE — Assessment & Plan Note (Signed)
situationally mild worse, but agrees to see gen surgury as planned

## 2013-08-24 NOTE — Assessment & Plan Note (Signed)
symptomaticaly improved with flomax, to cont for now, consider hold at next visit, declines urology referral for now

## 2013-08-24 NOTE — Assessment & Plan Note (Signed)
Symptomatically no change, cont same tx, tylenol prn, f/u with gen surgury as planned

## 2013-08-24 NOTE — Patient Instructions (Signed)
Please continue all other medications as before, and refills have been done if requested.  Please have the pharmacy call with any other refills you may need.  Please continue your efforts at being more active, low cholesterol diet, and weight control.  You are otherwise up to date with prevention measures today.  Please keep your appointments with your specialists as you may have planned - general surgury for next monday  Please return in 6 months, or sooner if needed

## 2013-08-24 NOTE — Progress Notes (Signed)
Pre visit review using our clinic review tool, if applicable. No additional management support is needed unless otherwise documented below in the visit note. 

## 2013-08-24 NOTE — Assessment & Plan Note (Signed)
stable overall by history and exam, recent data reviewed with pt, and pt to continue medical treatment as before,  to f/u any worsening symptoms or concerns BP Readings from Last 3 Encounters:  08/24/13 132/80  08/03/13 140/80  08/01/13 130/72

## 2013-08-24 NOTE — Progress Notes (Signed)
Subjective:    Patient ID: [name], female    DOB: [birth year], 73 y.o.   MRN: [id number]  HPI  Here to f/u, has waited on seeing gen surgury until next Monday in order to come here, still with lower abd discomfort no change from previous, seems to shift with turning in bed, without worseing pain, n/v, fever, more swelling than previous or bowel change.  Urination has marked improved with flomax.  Denies urinary symptoms such as dysuria, frequency, urgency, flank pain, hematuria or n/v, fever, chills. Recent CT with gallstones but unusual large lower ventral hernia as well, likely accounting for her swelling and discomfort. Is fairly miserable today, as she is not wanting any more surguries after her shoulder and appendix surguries, but stuck now in that her abd discomfort is not going away.   Past Medical History  Diagnosis Date  . HYPERLIPIDEMIA 11/18/2006  . HYPERKALEMIA 02/05/2008  . ANEMIA-IRON DEFICIENCY 09/22/2008  . Anemia of other chronic disease 02/05/2008  . ANXIETY 06/03/2008  . DEPRESSION 04/28/2009  . HYPERTENSION 11/18/2006  . PERICARDITIS 01/03/2007  . AORTIC STENOSIS/ INSUFFICIENCY, NON-RHEUMATIC 12/03/2008  . SINUSITIS- ACUTE-NOS 11/20/2007  . SINUSITIS, CHRONIC 02/05/2008  . ALLERGIC RHINITIS 11/18/2006  . PNEUMONIA 05/02/2007  . C O P D 06/27/2008  . Stricture and stenosis of esophagus 11/14/2008  . GERD 01/03/2007  . BARRETTS ESOPHAGUS 11/14/2008  . DYSPEPSIA 03/23/2007  . PRURITUS 10/08/2008  . OSTEOARTHRITIS 11/18/2006  . OSTEOARTHRITIS, KNEE, LEFT 01/03/2007  . Cervicalgia 01/13/2009  . BACK PAIN 02/04/2009    is improved  . BURSITIS, LEFT HIP 04/18/2009  . OSTEOPOROSIS 11/18/2006  . INSOMNIA-SLEEP DISORDER-UNSPEC 06/03/2008  . FATIGUE 04/18/2009  . PERIPHERAL EDEMA 02/13/2009  . MURMUR 11/18/2006  . DYSPHAGIA UNSPECIFIED 03/23/2007  . Abdominal pain, generalized 03/01/2007  . Nonspecific (abnormal) findings on radiological and other examination of body structure  06/03/2008  . Tuberculin Test Reaction 11/18/2006  . Acute bronchitis 02/23/2010  . CHOLELITHIASIS 04/24/2010  . NEPHROLITHIASIS, HX OF 04/24/2010  . Polyarthralgia 06/09/2010  . Elevated sed rate 06/11/2010  . Positive ANA (antinuclear antibody) 06/11/2010  . Rheumatoid factor positive 06/11/2010  . Cancer     lung ca  . 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 SPONDYLOSIS, CERVICAL, WITH RADICULOPATHY 01/13/2009  . Cervical radiculopathy 09/28/2010  . Lumbar radiculopathy 09/28/2010  . RENAL INSUFFICIENCY 02/05/2008    function has improved.  . Transfusion history 09-01-12    4'14 tranfused x 2 units  . Bleeding nose 09-01-12    past hx. none recent after having surgery  . Wound healing, delayed 09-01-12    right abdominal area   Past Surgical History  Procedure Laterality Date  . Abdominal hysterectomy    . Tubal ligation    . Tumor removal  08/06/08    from lungs  . Anterior cervical decomp/discectomy fusion  01/15/2011    Procedure: ANTERIOR CERVICAL DECOMPRESSION/DISCECTOMY FUSION 2 LEVELS;  Surgeon: Cooper Render Pool;  Location: [facility];  Service: Neurosurgery;  Laterality: N/A;  Cervical Three-Four, Cervical Four-Five Anterior Cervical Decompression Fusion   . Colonscopy    . Nose surgery    . Laparoscopic appendectomy N/A 05/25/2012    Procedure: APPENDECTOMY LAPAROSCOPIC  CONVERTED TO  OPEN APPENDECTOMY, exploratory laparatomy;  Surgeon: Odis Hollingshead, MD;  Location: [facility];  Service: General;  Laterality: N/A;  . Bowel resection N/A 05/25/2012    Procedure: SMALL BOWEL RESECTION;  Surgeon: Odis Hollingshead, MD;  Location: [facility];  Service: General;  Laterality: N/A;  . Laparoscopic lysis of adhesions N/A 05/25/2012    Procedure: LAPAROSCOPIC LYSIS OF ADHESIONS;  Surgeon: Odis Hollingshead, MD;  Location: [facility];  Service: General;  Laterality: N/A;  . Appendectomy    . Cataract extraction, bilateral  09-01-12  . Debridement of abdominal wall abscess N/A 09/07/2012    Procedure:   ABDOMINAL WOUND IRRIGATION AND TWO LAYERED CLOSURE;  Surgeon: Odis Hollingshead, MD;  Location: [facility];  Service: General;  Laterality: N/A;    reports that she quit smoking about 7 years ago. Her smoking use included Cigarettes. She has a 60 pack-year smoking history. She has quit using smokeless tobacco. She reports that she does not drink alcohol or use illicit drugs. family history includes Cancer in her brother; Coronary artery disease in her other; Heart disease in her father; Hyperlipidemia in her other; Hypertension in her other. There is no history of Colon cancer, Esophageal cancer, Rectal cancer, or Stomach cancer. Allergies  Allergen Reactions  . Amoxicillin Itching  . Fentanyl Other (See Comments)    hallucinations  . Hydrocodone Other (See Comments)    ineffective  . Codeine Hives and Rash  . Penicillins Hives and Rash   Current Outpatient Prescriptions on File Prior to Visit  Medication Sig Dispense Refill  . albuterol (PROVENTIL HFA;VENTOLIN HFA) 108 (90 BASE) MCG/ACT inhaler Inhale 1-2 puffs into the lungs 4 (four) times daily as needed for wheezing or shortness of breath. For wheezing or shortness of breath.      Marland Kitchen alendronate (FOSAMAX) 70 MG tablet Take 1 tablet (70 mg total) by mouth every 7 (seven) days. Take with a full glass of water on an empty stomach. Patient takes on Mondays.  4 tablet  11  . ALPRAZolam (XANAX) 0.5 MG tablet Take 0.5 mg by mouth daily as needed for anxiety.      Marland Kitchen amLODipine (NORVASC) 5 MG tablet Take 1 tablet (5 mg total) by mouth daily.  90 tablet  3  . citalopram (CELEXA) 10 MG tablet Take 1 tablet (10 mg total) by mouth every morning.  90 tablet  3  . feeding supplement (ENSURE COMPLETE) LIQD Take 237 mLs by mouth daily.      . furosemide (LASIX) 40 MG tablet TAKE 1 TABLET BY MOUTH 2 TIMES DAILY  180 tablet  3  . metoprolol succinate (TOPROL-XL) 25 MG 24 hr tablet TAKE 1 TABLET (25 MG TOTAL) BY MOUTH EVERY MORNING.  90 tablet  1  . pantoprazole  (PROTONIX) 40 MG tablet Take 40 mg by mouth daily.       . pantoprazole (PROTONIX) 40 MG tablet TAKE 1 TABLET (40 MG TOTAL) BY MOUTH DAILY.  90 tablet  3  . pravastatin (PRAVACHOL) 40 MG tablet TAKE 1 TABLET BY MOUTH EVERY DAY  90 tablet  3  . tamsulosin (FLOMAX) 0.4 MG CAPS capsule Take 1 capsule (0.4 mg total) by mouth daily.  30 capsule  5  . traMADol (ULTRAM) 50 MG tablet Take 1 tablet (50 mg total) by mouth every 8 (eight) hours as needed.  60 tablet  2  . zolpidem (AMBIEN) 5 MG tablet Take 1 tablet (5 mg total) by mouth at bedtime as needed. For sleep.  30 tablet  5   No current facility-administered medications on file prior to visit.   Review of Systems  Constitutional: Negative for unusual diaphoresis or other sweats  HENT: Negative for ringing in ear Eyes: Negative for double vision or worsening visual disturbance.  Respiratory: Negative for choking and stridor.   Gastrointestinal: Negative for vomiting or other signifcant bowel change Genitourinary: Negative for hematuria or decreased urine volume.  Musculoskeletal: Negative for other MSK pain or swelling Skin: Negative for color change and worsening wound.  Neurological: Negative for tremors and numbness other than noted  Psychiatric/Behavioral: Negative for decreased concentration or agitation other than above       Objective:   Physical Exam BP 132/80  Pulse 71  Temp(Src) 98.3 F (36.8 C) (Oral)  Wt 171 lb (77.565 kg)  SpO2 92% VS noted,  Constitutional: Pt appears well-developed, well-nourished.  HENT: Head: NCAT.  Right Ear: External ear normal.  Left Ear: External ear normal.  Eyes: . Pupils are equal, round, and reactive to light. Conjunctivae and EOM are normal Neck: Normal range of motion. Neck supple.  Cardiovascular: Normal rate and regular rhythm.   Pulmonary/Chest: Effort normal and breath sounds normal.  Abd:  Soft,+ BS, diffuse lower mild tender again with large swelling lower abd diffusely, no  guarding or rebound Neurological: Pt is alert. Not confused , motor grossly intact Skin: Skin is warm. No rash, No LE edema Psychiatric: Pt behavior is normal. No agitation. lower mood today, somewhat discouraged    Assessment & Plan:

## 2013-08-27 ENCOUNTER — Telehealth: Payer: Self-pay | Admitting: Internal Medicine

## 2013-08-27 ENCOUNTER — Encounter (INDEPENDENT_AMBULATORY_CARE_PROVIDER_SITE_OTHER): Payer: Self-pay | Admitting: General Surgery

## 2013-08-27 ENCOUNTER — Ambulatory Visit (INDEPENDENT_AMBULATORY_CARE_PROVIDER_SITE_OTHER): Payer: Medicare HMO | Admitting: General Surgery

## 2013-08-27 VITALS — BP 102/70 | HR 60 | Resp 14 | Ht 62.0 in | Wt 172.0 lb

## 2013-08-27 DIAGNOSIS — K432 Incisional hernia without obstruction or gangrene: Secondary | ICD-10-CM

## 2013-08-27 NOTE — Progress Notes (Signed)
Patient ID: [name], female   DOB: [birth year], 73 y.o.   MRN: [id number]  Chief Complaint  Patient presents with  . ventral hernia    HPI [name] is a 73 y.o. female.   HPI  She is sent over by Dr. Jenny Reichmann because of an enlarging ventral incisional hernia. She underwent an emergency appendectomy by way of exploratory laparotomy for perforated appendicitis 09/07/12. Small bowel resection was done at the same time. She had a chronic nonhealing wound and was taken back to the operating room for this. The wound eventually healed. However, she is noted increasing swelling in the lower abdomen. CT scan demonstrates a very wide mouth ventral hernia containing small and large bowel. There is no evidence of obstruction. She's having no nausea or vomiting. Bowels are moving normally. She is just concerned about the increasing swelling in her lower abdomen. She is not having severe pain from this but does have some discomfort. She has not tried a binder or a girdle.  Past Medical History  Diagnosis Date  . HYPERLIPIDEMIA 11/18/2006  . HYPERKALEMIA 02/05/2008  . ANEMIA-IRON DEFICIENCY 09/22/2008  . Anemia of other chronic disease 02/05/2008  . ANXIETY 06/03/2008  . DEPRESSION 04/28/2009  . HYPERTENSION 11/18/2006  . PERICARDITIS 01/03/2007  . AORTIC STENOSIS/ INSUFFICIENCY, NON-RHEUMATIC 12/03/2008  . SINUSITIS- ACUTE-NOS 11/20/2007  . SINUSITIS, CHRONIC 02/05/2008  . ALLERGIC RHINITIS 11/18/2006  . PNEUMONIA 05/02/2007  . C O P D 06/27/2008  . Stricture and stenosis of esophagus 11/14/2008  . GERD 01/03/2007  . BARRETTS ESOPHAGUS 11/14/2008  . DYSPEPSIA 03/23/2007  . PRURITUS 10/08/2008  . OSTEOARTHRITIS 11/18/2006  . OSTEOARTHRITIS, KNEE, LEFT 01/03/2007  . Cervicalgia 01/13/2009  . BACK PAIN 02/04/2009    is improved  . BURSITIS, LEFT HIP 04/18/2009  . OSTEOPOROSIS 11/18/2006  . INSOMNIA-SLEEP DISORDER-UNSPEC 06/03/2008  . FATIGUE 04/18/2009  . PERIPHERAL EDEMA 02/13/2009  . MURMUR 11/18/2006  .  DYSPHAGIA UNSPECIFIED 03/23/2007  . Abdominal pain, generalized 03/01/2007  . Nonspecific (abnormal) findings on radiological and other examination of body structure 06/03/2008  . Tuberculin Test Reaction 11/18/2006  . Acute bronchitis 02/23/2010  . CHOLELITHIASIS 04/24/2010  . NEPHROLITHIASIS, HX OF 04/24/2010  . Polyarthralgia 06/09/2010  . Elevated sed rate 06/11/2010  . Positive ANA (antinuclear antibody) 06/11/2010  . Rheumatoid factor positive 06/11/2010  . Cancer     lung ca  . 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 SPONDYLOSIS, CERVICAL, WITH RADICULOPATHY 01/13/2009  . Cervical radiculopathy 09/28/2010  . Lumbar radiculopathy 09/28/2010  . RENAL INSUFFICIENCY 02/05/2008    function has improved.  . Transfusion history 09-01-12    4'14 tranfused x 2 units  . Bleeding nose 09-01-12    past hx. none recent after having surgery  . Wound healing, delayed 09-01-12    right abdominal area    Past Surgical History  Procedure Laterality Date  . Abdominal hysterectomy    . Tubal ligation    . Tumor removal  08/06/08    from lungs  . Anterior cervical decomp/discectomy fusion  01/15/2011    Procedure: ANTERIOR CERVICAL DECOMPRESSION/DISCECTOMY FUSION 2 LEVELS;  Surgeon: Cooper Render Pool;  Location: [facility];  Service: Neurosurgery;  Laterality: N/A;  Cervical Three-Four, Cervical Four-Five Anterior Cervical Decompression Fusion   . Colonscopy    . Nose surgery    . Laparoscopic appendectomy N/A 05/25/2012    Procedure: APPENDECTOMY LAPAROSCOPIC  CONVERTED TO  OPEN APPENDECTOMY, exploratory laparatomy;  Surgeon: Odis Hollingshead, MD;  Location: [facility];  Service: General;  Laterality: N/A;  . Bowel resection N/A 05/25/2012    Procedure: SMALL BOWEL RESECTION;  Surgeon: Odis Hollingshead, MD;  Location: [facility];  Service: General;  Laterality: N/A;  . Laparoscopic lysis of adhesions N/A 05/25/2012    Procedure: LAPAROSCOPIC LYSIS OF ADHESIONS;  Surgeon: Odis Hollingshead, MD;  Location: [facility];   Service: General;  Laterality: N/A;  . Appendectomy    . Cataract extraction, bilateral  09-01-12  . Debridement of abdominal wall abscess N/A 09/07/2012    Procedure:  ABDOMINAL WOUND IRRIGATION AND TWO LAYERED CLOSURE;  Surgeon: Odis Hollingshead, MD;  Location: [facility];  Service: General;  Laterality: N/A;    Family History  Problem Relation Age of Onset  . Hyperlipidemia Other   . Hypertension Other   . Coronary artery disease Other     several nephrew  . Heart disease Father   . Colon cancer Neg Hx   . Esophageal cancer Neg Hx   . Rectal cancer Neg Hx   . Stomach cancer Neg Hx   . Cancer Brother     ?stomach    Social History History  Substance Use Topics  . Smoking status: Former Smoker -- 1.50 packs/day for 40 years    Types: Cigarettes    Quit date: 02/15/2006  . Smokeless tobacco: Former Systems developer  . Alcohol Use: No    Allergies  Allergen Reactions  . Amoxicillin Itching  . Fentanyl Other (See Comments)    hallucinations  . Hydrocodone Other (See Comments)    ineffective  . Codeine Hives and Rash  . Penicillins Hives and Rash    Current Outpatient Prescriptions  Medication Sig Dispense Refill  . albuterol (PROVENTIL HFA;VENTOLIN HFA) 108 (90 BASE) MCG/ACT inhaler Inhale 1-2 puffs into the lungs 4 (four) times daily as needed for wheezing or shortness of breath. For wheezing or shortness of breath.      Marland Kitchen alendronate (FOSAMAX) 70 MG tablet Take 1 tablet (70 mg total) by mouth every 7 (seven) days. Take with a full glass of water on an empty stomach. Patient takes on Mondays.  4 tablet  11  . ALPRAZolam (XANAX) 0.5 MG tablet Take 0.5 mg by mouth daily as needed for anxiety.      Marland Kitchen amLODipine (NORVASC) 5 MG tablet Take 1 tablet (5 mg total) by mouth daily.  90 tablet  3  . citalopram (CELEXA) 10 MG tablet Take 1 tablet (10 mg total) by mouth every morning.  90 tablet  3  . feeding supplement (ENSURE COMPLETE) LIQD Take 237 mLs by mouth daily.      . furosemide (LASIX)  40 MG tablet TAKE 1 TABLET BY MOUTH 2 TIMES DAILY  180 tablet  3  . metoprolol succinate (TOPROL-XL) 25 MG 24 hr tablet TAKE 1 TABLET (25 MG TOTAL) BY MOUTH EVERY MORNING.  90 tablet  1  . pantoprazole (PROTONIX) 40 MG tablet Take 40 mg by mouth daily.       . pantoprazole (PROTONIX) 40 MG tablet TAKE 1 TABLET (40 MG TOTAL) BY MOUTH DAILY.  90 tablet  3  . pravastatin (PRAVACHOL) 40 MG tablet TAKE 1 TABLET BY MOUTH EVERY DAY  90 tablet  3  . tamsulosin (FLOMAX) 0.4 MG CAPS capsule Take 1 capsule (0.4 mg total) by mouth daily.  30 capsule  5  . traMADol (ULTRAM) 50 MG tablet Take 1 tablet (50 mg total) by mouth every 8 (eight) hours as needed.  60 tablet  2  . zolpidem (AMBIEN) 5 MG tablet Take 1 tablet (5 mg total) by mouth at bedtime as needed. For sleep.  30 tablet  5   No current facility-administered medications for this visit.    Review of Systems Review of Systems  Constitutional: Negative for fever, activity change and appetite change.  Gastrointestinal: Positive for abdominal pain and abdominal distention. Negative for diarrhea, constipation and blood in stool.  Genitourinary: Negative for hematuria and difficulty urinating.    Blood pressure 102/70, pulse 60, resp. rate 14, height 5\' 2"  (1.575 m), weight 172 lb (78.019 kg).  Physical Exam Physical Exam  Constitutional: No distress.  Overweight female.  HENT:  Head: Normocephalic and atraumatic.  Cardiovascular: Normal rate and regular rhythm.   Murmur heard. Pulmonary/Chest:  Distant breath sounds. Some right basilar rales.  Abdominal: Soft. There is no rebound and no guarding.  Right lower quadrant transverse incision extending to the midline. There is a large bulge noted. There is mild tenderness to palpation. No erythema.  Musculoskeletal: She exhibits no edema.  Neurological: She is alert.  Skin: Skin is warm and dry.    Data Reviewed CT scan. Operative notes.   Assessment    Large ventral incisional hernia in  an elderly woman with multiple medical problems. Mildly symptomatic with respect of discomfort but no obstruction symptoms.     Plan    We discussed a complicated open ventral incisional hernia repair versus expectant management and wearing a girdle or a binder.I have discussed the procedure, risks, and aftercare. Risks include but are not limited to bleeding, infection, wound healing problems, anesthesia, recurrence, accidental injury to intra-abdominal organs-such as intestine, liver, spleen, bladder, etc, death. We also discussed the rare complication of mesh rejection. All questions were answered.   She is not interested in surgery at this time and would like to try the abdominal binder. If she is interested in surgery, she may need a referral to a tertiary care center for the complex repair.       [name] J 08/27/2013, 2:27 PM

## 2013-08-27 NOTE — Telephone Encounter (Signed)
Relevant patient education assigned to patient using Emmi. ° °

## 2013-08-27 NOTE — Patient Instructions (Signed)
Wear a girdle or the abdominal binder. If you decide you would like to have the surgery, please let us know.

## 2013-09-05 ENCOUNTER — Other Ambulatory Visit: Payer: Self-pay

## 2013-09-05 MED ORDER — PRAVASTATIN SODIUM 40 MG PO TABS: 40.0000 mg | ORAL_TABLET | Freq: Every day | ORAL | Status: DC

## 2013-09-20 ENCOUNTER — Ambulatory Visit (INDEPENDENT_AMBULATORY_CARE_PROVIDER_SITE_OTHER): Payer: Managed Care, Other (non HMO) | Admitting: Family Medicine

## 2013-09-20 ENCOUNTER — Ambulatory Visit (INDEPENDENT_AMBULATORY_CARE_PROVIDER_SITE_OTHER)
Admission: RE | Admit: 2013-09-20 | Discharge: 2013-09-20 | Disposition: A | Discharge status: Home / Self Care | Payer: Managed Care, Other (non HMO) | Source: Ambulatory Visit | Attending: Family Medicine | Admitting: Family Medicine

## 2013-09-20 ENCOUNTER — Encounter: Payer: Self-pay | Admitting: Family Medicine

## 2013-09-20 VITALS — BP 120/62 | HR 70 | Wt 177.0 lb

## 2013-09-20 DIAGNOSIS — M19019 Primary osteoarthritis, unspecified shoulder: Secondary | ICD-10-CM

## 2013-09-20 DIAGNOSIS — M25519 Pain in unspecified shoulder: Secondary | ICD-10-CM

## 2013-09-20 DIAGNOSIS — M12811 Other specific arthropathies, not elsewhere classified, right shoulder: Secondary | ICD-10-CM

## 2013-09-20 DIAGNOSIS — M25511 Pain in right shoulder: Secondary | ICD-10-CM

## 2013-09-20 DIAGNOSIS — M75101 Unspecified rotator cuff tear or rupture of right shoulder, not specified as traumatic: Secondary | ICD-10-CM

## 2013-09-20 NOTE — Patient Instructions (Addendum)
It is good to see you again.  Ice is still your friend. 10 minutes at end of the day.  Lets get a new xray downstairs today to get a baseline.  Continue the exercises 2-3 times a week.  Try using the cain in the left hand.  Come back again in 3-4 weeks if continuing to have trouble.  Can always repeat injection every 3-4 months if needed.

## 2013-09-20 NOTE — Progress Notes (Signed)
Followup right  shoulder pain follow up.   HPI: Patient is a 73 year old female with significant comorbidities and multiple medical problems coming in with followup of right shoulder pain. 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 Patient did have a corticosteroid injection back in October of last year and then again made which was greater than 3 months ago.   Patient states unfortunately over the last 2 weeks she started having pain again. Patient does ambulate with the aid of a cane and states that when she puts all her weight on this arm it causes her shoulder to have a dull aching pain. States that the pain is also waking her up at night. Patient has many other medical problems including carcinoma of the lung and congestive heart failure. Patient has also had cervical cord compression with myelopathy. Patient still states that she does get good relief with injections into the right shoulder. Past medical, surgical, family and social history reviewed. Medications reviewed all in the electronic medical record.   Review of Systems: No headache, visual changes, nausea, vomiting, diarrhea, constipation, dizziness, abdominal pain, skin rash, fevers, chills, night sweats, weight loss, swollen lymph nodes, body aches, joint swelling, muscle aches, chest pain, shortness of breath, mood changes.   Objective:    Blood pressure 120/62, pulse 70, weight 177 lb (80.287 kg).   General: No apparent distress alert and oriented x3 mood and affect normal, dressed appropriately. Patient does have some increased kyphosis HEENT: Pupils equal, extraocular movements intact Respiratory: Patient's speak in full sentences and does not appear short of breath Cardiovascular: No lower extremity edema, non tender, no erythema Skin: Warm dry intact with no signs of infection or rash on extremities or on axial skeleton. Abdomen: Soft nontender Neuro: Cranial nerves II  through XII are intact, neurovascularly intact in all extremities with 2+ DTRs and 2+ pulses. Lymph: No lymphadenopathy of posterior or anterior cervical chain or axillae bilaterally.  Gait patient does walk with a cane but does have good balance  MSK: Non tender with full range of motion and good stability and symmetric strength and tone of, elbows, wrist, hip, knee and ankles bilaterally.  Shoulder: Right Inspection reveals no abnormalities, atrophy or asymmetry. Palpation is normal with no tenderness over AC joint or bicipital groove. Patient is only able to do active forward flexion to 90, abduction to 80, internal rotation to hip. Contralateral side has full range of motion but does have crepitus The patient's rotator cuff strength is 4/5 with positive drop arm sign. Patient has 5 out of 5 strength of the contralateral side Positive painful arc Positive empty can severe crepitus on range of motion Speeds and Yergason's tests normal. No labral pathology noted with negative Obrien's, negative clunk and good stability. Normal scapular function observed. No specific change from previous exam.  Procedure: Real-time Ultrasound Guided Injection of right glenohumeral joint Device: GE Logiq E  Ultrasound guided injection is preferred based studies that show increased duration, increased effect, greater accuracy, decreased procedural pain, increased response rate with ultrasound guided versus blind injection.  Verbal informed consent obtained.  Time-out conducted.  Noted no overlying erythema, induration, or other signs of local infection.  Skin prepped in a sterile fashion.  Local anesthesia: Topical Ethyl chloride.  With sterile technique and under real time ultrasound guidance:  Joint visualized.  23g 1  inch needle inserted posterior approach. Pictures taken for needle placement. Patient did have injection of 2 cc of 1%  lidocaine, 2 cc of 0.5% Marcaine, and 1.0 cc of Kenalog 40  mg/dL. Completed without difficulty  Pain immediately resolved suggesting accurate placement of the medication.  Advised to call if fevers/chills, erythema, induration, drainage, or persistent bleeding.  Images permanently stored and available for review in the ultrasound unit.  Impression: Technically successful ultrasound guided injection.    Impression and Recommendations:     This case required medical decision making of moderate complexity.

## 2013-09-20 NOTE — Assessment & Plan Note (Signed)
Patient was given another injection today. We are Nino the patient has a full-thickness rotator cuff tear and is not a candidate for surgical repair. Patient though needs to continue with conservative therapy. Patient has done formal physical therapy previously and has failed all other conservative therapy. If patient continues to have pain and worsening pain within her 3 month window I would consider Visco supplementation. His lungs pain is well controlled we can continue to corticosteroid every 3-4 months. New x-rays ordered today.  Spent greater than 25 minutes with patient face-to-face and had greater than 50% of counseling including as described above in assessment and plan.

## 2013-09-24 ENCOUNTER — Other Ambulatory Visit: Payer: Self-pay | Admitting: Cardiovascular Disease

## 2013-09-24 ENCOUNTER — Other Ambulatory Visit: Payer: Self-pay

## 2013-09-24 MED ORDER — METOPROLOL SUCCINATE ER 25 MG PO TB24: ORAL_TABLET | ORAL | Status: DC

## 2013-10-01 ENCOUNTER — Telehealth: Payer: Self-pay | Admitting: *Deleted

## 2013-10-01 ENCOUNTER — Other Ambulatory Visit: Payer: Self-pay | Admitting: Internal Medicine

## 2013-10-01 NOTE — Telephone Encounter (Signed)
[facility] note dated 09/27/13 By Dr Delia Chimes given to Dr Vista Mink to review.  SLJ

## 2013-10-16 ENCOUNTER — Other Ambulatory Visit: Payer: Self-pay | Admitting: Internal Medicine

## 2013-10-16 NOTE — Telephone Encounter (Signed)
Faxed hardcopy to [facility]

## 2013-10-16 NOTE — Telephone Encounter (Signed)
Done hardcopy to robin  

## 2013-10-19 ENCOUNTER — Other Ambulatory Visit: Payer: Self-pay | Admitting: Internal Medicine

## 2013-11-06 ENCOUNTER — Ambulatory Visit (INDEPENDENT_AMBULATORY_CARE_PROVIDER_SITE_OTHER): Payer: Managed Care, Other (non HMO) | Admitting: Internal Medicine

## 2013-11-06 ENCOUNTER — Other Ambulatory Visit: Payer: Self-pay

## 2013-11-06 ENCOUNTER — Encounter: Payer: Self-pay | Admitting: Internal Medicine

## 2013-11-06 VITALS — BP 132/78 | HR 66 | Temp 98.3°F | Wt 172.0 lb

## 2013-11-06 DIAGNOSIS — Z23 Encounter for immunization: Secondary | ICD-10-CM

## 2013-11-06 DIAGNOSIS — I509 Heart failure, unspecified: Secondary | ICD-10-CM

## 2013-11-06 DIAGNOSIS — I5032 Chronic diastolic (congestive) heart failure: Secondary | ICD-10-CM

## 2013-11-06 DIAGNOSIS — J449 Chronic obstructive pulmonary disease, unspecified: Secondary | ICD-10-CM

## 2013-11-06 DIAGNOSIS — T783XXA Angioneurotic edema, initial encounter: Secondary | ICD-10-CM | POA: Insufficient documentation

## 2013-11-06 MED ORDER — CITALOPRAM HYDROBROMIDE 10 MG PO TABS: 10.0000 mg | ORAL_TABLET | Freq: Every morning | ORAL | Status: DC

## 2013-11-06 MED ORDER — METHYLPREDNISOLONE ACETATE 80 MG/ML IJ SUSP
80.0000 mg | Freq: Once | INTRAMUSCULAR | Status: AC
  Administered 2013-11-06: 80 mg via INTRAMUSCULAR

## 2013-11-06 MED ORDER — PREDNISONE 10 MG PO TABS: ORAL_TABLET | ORAL | Status: DC

## 2013-11-06 NOTE — Patient Instructions (Addendum)
You had the flu shot today, and the steroid shot  Please take all new medication as prescribed - the prednisone  Please continue all other medications as before, and refills have been done if requested.  Please have the pharmacy call with any other refills you may need.  Please continue your efforts at being more active, low cholesterol diet, and weight control.  You are otherwise up to date with prevention measures today.  Please keep your appointments with your specialists as you may have planned

## 2013-11-06 NOTE — Assessment & Plan Note (Signed)
stable overall by history and exam, and pt to continue medical treatment as before,  to f/u any worsening symptoms or concerns 

## 2013-11-06 NOTE — Assessment & Plan Note (Signed)
stable overall by history and exam, recent data reviewed with pt, and pt to continue medical treatment as before,  to f/u any worsening symptoms or concerns SpO2 Readings from Last 3 Encounters:  11/06/13 92%  08/24/13 92%  08/03/13 94%

## 2013-11-06 NOTE — Progress Notes (Signed)
Pre visit review using our clinic review tool, if applicable. No additional management support is needed unless otherwise documented below in the visit note. 

## 2013-11-06 NOTE — Progress Notes (Signed)
Subjective:    Patient ID: [name], female    DOB: 12/14/1940, 73 y.o.   MRN: [id number]  HPI  Here with acute onset forehead area itching and swelling, mild for 1 wk, with faint erythema, mild discomfort to palpate, not worse but not better over this time, does also have other itching unusual about the ear, and left max sinus area.  No other rash or swelling.  Has seen Allergist years ago with shots that did seem to help at the time.  No prior hx of this. Not on ACE or similar.  No lip or tongue swelling, Pt denies chest pain, increased sob or doe, wheezing, orthopnea, PND, increased LE swelling, palpitations, dizziness or syncope. Seeing renal recent with CKD - stable per pt.  Has seen surgury re: incisional hernia, not interested in surgury for now, no abd pain. Pt denies chest pain, increased sob or doe, wheezing, orthopnea, PND, increased LE swelling, palpitations, dizziness or syncope. Past Medical History  Diagnosis Date  . HYPERLIPIDEMIA 11/18/2006  . HYPERKALEMIA 02/05/2008  . ANEMIA-IRON DEFICIENCY 09/22/2008  . Anemia of other chronic disease 02/05/2008  . ANXIETY 06/03/2008  . DEPRESSION 04/28/2009  . HYPERTENSION 11/18/2006  . PERICARDITIS 01/03/2007  . AORTIC STENOSIS/ INSUFFICIENCY, NON-RHEUMATIC 12/03/2008  . SINUSITIS- ACUTE-NOS 11/20/2007  . SINUSITIS, CHRONIC 02/05/2008  . ALLERGIC RHINITIS 11/18/2006  . PNEUMONIA 05/02/2007  . C O P D 06/27/2008  . Stricture and stenosis of esophagus 11/14/2008  . GERD 01/03/2007  . BARRETTS ESOPHAGUS 11/14/2008  . DYSPEPSIA 03/23/2007  . PRURITUS 10/08/2008  . OSTEOARTHRITIS 11/18/2006  . OSTEOARTHRITIS, KNEE, LEFT 01/03/2007  . Cervicalgia 01/13/2009  . BACK PAIN 02/04/2009    is improved  . BURSITIS, LEFT HIP 04/18/2009  . OSTEOPOROSIS 11/18/2006  . INSOMNIA-SLEEP DISORDER-UNSPEC 06/03/2008  . FATIGUE 04/18/2009  . PERIPHERAL EDEMA 02/13/2009  . MURMUR 11/18/2006  . DYSPHAGIA UNSPECIFIED 03/23/2007  . Abdominal pain, generalized 03/01/2007    . Nonspecific (abnormal) findings on radiological and other examination of body structure 06/03/2008  . Tuberculin Test Reaction 11/18/2006  . Acute bronchitis 02/23/2010  . CHOLELITHIASIS 04/24/2010  . NEPHROLITHIASIS, HX OF 04/24/2010  . Polyarthralgia 06/09/2010  . Elevated sed rate 06/11/2010  . Positive ANA (antinuclear antibody) 06/11/2010  . Rheumatoid factor positive 06/11/2010  . Cancer     lung ca  . 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 SPONDYLOSIS, CERVICAL, WITH RADICULOPATHY 01/13/2009  . Cervical radiculopathy 09/28/2010  . Lumbar radiculopathy 09/28/2010  . RENAL INSUFFICIENCY 02/05/2008    function has improved.  . Transfusion history 09-01-12    4'14 tranfused x 2 units  . Bleeding nose 09-01-12    past hx. none recent after having surgery  . Wound healing, delayed 09-01-12    right abdominal area   Past Surgical History  Procedure Laterality Date  . Abdominal hysterectomy    . Tubal ligation    . Tumor removal  08/06/08    from lungs  . Anterior cervical decomp/discectomy fusion  01/15/2011    Procedure: ANTERIOR CERVICAL DECOMPRESSION/DISCECTOMY FUSION 2 LEVELS;  Surgeon: Cooper Render Pool;  Location: [facility];  Service: Neurosurgery;  Laterality: N/A;  Cervical Three-Four, Cervical Four-Five Anterior Cervical Decompression Fusion   . Colonscopy    . Nose surgery    . Laparoscopic appendectomy N/A 05/25/2012    Procedure: APPENDECTOMY LAPAROSCOPIC  CONVERTED TO  OPEN APPENDECTOMY, exploratory laparatomy;  Surgeon: Odis Hollingshead, MD;  Location: [facility];  Service: General;  Laterality: N/A;  .  Bowel resection N/A 05/25/2012    Procedure: SMALL BOWEL RESECTION;  Surgeon: Odis Hollingshead, MD;  Location: [facility];  Service: General;  Laterality: N/A;  . Laparoscopic lysis of adhesions N/A 05/25/2012    Procedure: LAPAROSCOPIC LYSIS OF ADHESIONS;  Surgeon: Odis Hollingshead, MD;  Location: [facility];  Service: General;  Laterality: N/A;  . Appendectomy    . Cataract  extraction, bilateral  09-01-12  . Debridement of abdominal wall abscess N/A 09/07/2012    Procedure:  ABDOMINAL WOUND IRRIGATION AND TWO LAYERED CLOSURE;  Surgeon: Odis Hollingshead, MD;  Location: [facility];  Service: General;  Laterality: N/A;    reports that she quit smoking about 7 years ago. Her smoking use included Cigarettes. She has a 60 pack-year smoking history. She has quit using smokeless tobacco. She reports that she does not drink alcohol or use illicit drugs. family history includes Cancer in her brother; Coronary artery disease in her other; Heart disease in her father; Hyperlipidemia in her other; Hypertension in her other. There is no history of Colon cancer, Esophageal cancer, Rectal cancer, or Stomach cancer. Allergies  Allergen Reactions  . Amoxicillin Itching  . Fentanyl Other (See Comments)    hallucinations  . Hydrocodone Other (See Comments)    ineffective  . Codeine Hives and Rash  . Penicillins Hives and Rash   Current Outpatient Prescriptions on File Prior to Visit  Medication Sig Dispense Refill  . albuterol (PROVENTIL HFA;VENTOLIN HFA) 108 (90 BASE) MCG/ACT inhaler Inhale 1-2 puffs into the lungs 4 (four) times daily as needed for wheezing or shortness of breath. For wheezing or shortness of breath.      Marland Kitchen alendronate (FOSAMAX) 70 MG tablet Take 1 tablet (70 mg total) by mouth every 7 (seven) days. Take with a full glass of water on an empty stomach. Patient takes on Mondays.  4 tablet  11  . alendronate (FOSAMAX) 70 MG tablet TAKE 1 TABLET (70 MG TOTAL) BY MOUTH EVERY 7 (SEVEN) DAYS. TAKE WITH A FULL GLASS OF WATER ON AN EMPTY STOMACH.  4 tablet  10  . ALPRAZolam (XANAX) 0.5 MG tablet Take 0.5 mg by mouth daily as needed for anxiety.      Marland Kitchen amLODipine (NORVASC) 5 MG tablet TAKE 1 TABLET (5 MG TOTAL) BY MOUTH DAILY.  90 tablet  3  . feeding supplement (ENSURE COMPLETE) LIQD Take 237 mLs by mouth daily.      . furosemide (LASIX) 40 MG tablet TAKE 1 TABLET BY MOUTH 2  TIMES DAILY  180 tablet  3  . metoprolol succinate (TOPROL-XL) 25 MG 24 hr tablet TAKE 1 TABLET (25 MG TOTAL) BY MOUTH EVERY MORNING.  90 tablet  1  . pantoprazole (PROTONIX) 40 MG tablet Take 40 mg by mouth daily.       . pantoprazole (PROTONIX) 40 MG tablet TAKE 1 TABLET (40 MG TOTAL) BY MOUTH DAILY.  90 tablet  3  . pravastatin (PRAVACHOL) 40 MG tablet Take 1 tablet (40 mg total) by mouth daily.  90 tablet  3  . tamsulosin (FLOMAX) 0.4 MG CAPS capsule Take 1 capsule (0.4 mg total) by mouth daily.  30 capsule  5  . traMADol (ULTRAM) 50 MG tablet TAKE 1 TABLET EVERY 8 HOURS AS NEEDED  60 tablet  2  . zolpidem (AMBIEN) 5 MG tablet Take 1 tablet (5 mg total) by mouth at bedtime as needed. For sleep.  30 tablet  5   No current facility-administered medications on file  prior to visit.   Review of Systems  Constitutional: Negative for unusual diaphoresis or other sweats  HENT: Negative for ringing in ear Eyes: Negative for double vision or worsening visual disturbance.  Respiratory: Negative for choking and stridor.   Gastrointestinal: Negative for vomiting or other signifcant bowel change Genitourinary: Negative for hematuria or decreased urine volume.  Musculoskeletal: Negative for other MSK pain or swelling Skin: Negative for color change and worsening wound.  Neurological: Negative for tremors and numbness other than noted  Psychiatric/Behavioral: Negative for decreased concentration or agitation other than above       Objective:   Physical Exam BP 132/78  Pulse 66  Temp(Src) 98.3 F (36.8 C) (Oral)  Wt 172 lb (78.019 kg)  SpO2 92% VS noted, non toxic Constitutional: Pt appears well-developed, well-nourished.  HENT: Head: NCAT.  Right Ear: External ear normal.  Left Ear: External ear normal.  Eyes: . Pupils are equal, round, and reactive to light. Conjunctivae and EOM are normal Neck: Normal range of motion. Neck supple.  Cardiovascular: Normal rate and regular rhythm.    Pulmonary/Chest: Effort normal and breath sounds without rales or wheezing Neurological: Pt is alert. Not confused , motor grossly intact Skin: Skin is mild warmth and palpable nontender swelling to most of forehead bilat starting at the hairline No LE edema except trace right pedal edema Psychiatric: Pt behavior is normal. No agitation.     Assessment & Plan:

## 2013-11-06 NOTE — Assessment & Plan Note (Signed)
Mild to mod, etiology unclear, for depomedrol IM, predpac asd, benadryl 25-50 mg oral or benadryl cream prn,  to f/u any worsening symptoms or concerns, consider allergy referral

## 2013-11-09 ENCOUNTER — Other Ambulatory Visit: Payer: Self-pay

## 2013-11-09 DIAGNOSIS — Z1231 Encounter for screening mammogram for malignant neoplasm of breast: Secondary | ICD-10-CM

## 2013-11-20 ENCOUNTER — Ambulatory Visit: Payer: Managed Care, Other (non HMO) | Admitting: Internal Medicine

## 2013-11-26 ENCOUNTER — Other Ambulatory Visit: Payer: Self-pay | Admitting: *Deleted

## 2013-11-26 MED ORDER — CITALOPRAM HYDROBROMIDE 10 MG PO TABS: 10.0000 mg | ORAL_TABLET | Freq: Every morning | ORAL | Status: DC

## 2013-11-30 ENCOUNTER — Other Ambulatory Visit: Payer: Self-pay

## 2013-12-04 ENCOUNTER — Ambulatory Visit
Admission: RE | Admit: 2013-12-04 | Discharge: 2013-12-04 | Disposition: A | Discharge status: Home / Self Care | Payer: Medicare HMO | Source: Ambulatory Visit

## 2013-12-04 DIAGNOSIS — Z1231 Encounter for screening mammogram for malignant neoplasm of breast: Secondary | ICD-10-CM

## 2014-01-01 ENCOUNTER — Ambulatory Visit: Payer: Managed Care, Other (non HMO) | Admitting: Internal Medicine

## 2014-01-15 ENCOUNTER — Other Ambulatory Visit: Payer: Self-pay | Admitting: Internal Medicine

## 2014-02-11 ENCOUNTER — Other Ambulatory Visit: Payer: Self-pay | Admitting: Internal Medicine

## 2014-02-12 NOTE — Telephone Encounter (Signed)
Faxed script back to [facility].../lmb 

## 2014-02-12 NOTE — Telephone Encounter (Signed)
Done hardcopy to robin  

## 2014-02-26 ENCOUNTER — Encounter: Payer: Self-pay | Admitting: Cardiovascular Disease

## 2014-02-26 ENCOUNTER — Ambulatory Visit (INDEPENDENT_AMBULATORY_CARE_PROVIDER_SITE_OTHER): Payer: Medicare HMO | Admitting: Cardiovascular Disease

## 2014-02-26 VITALS — BP 110/48 | HR 74 | Ht 61.0 in | Wt 177.2 lb

## 2014-02-26 DIAGNOSIS — I351 Nonrheumatic aortic (valve) insufficiency: Secondary | ICD-10-CM

## 2014-02-26 DIAGNOSIS — I5032 Chronic diastolic (congestive) heart failure: Secondary | ICD-10-CM

## 2014-02-26 DIAGNOSIS — I472 Ventricular tachycardia: Secondary | ICD-10-CM

## 2014-02-26 DIAGNOSIS — I4729 Other ventricular tachycardia: Secondary | ICD-10-CM

## 2014-02-26 DIAGNOSIS — I340 Nonrheumatic mitral (valve) insufficiency: Secondary | ICD-10-CM

## 2014-02-26 NOTE — Patient Instructions (Signed)
Your physician wants you to follow-up in:  12 months.  You will receive a reminder letter in the mail two months in advance. If you don't receive a letter, please call our office to schedule the follow-up appointment.  Your physician has requested that you have an echocardiogram. Echocardiography is a painless test that uses sound waves to create images of your heart. It provides your doctor with information about the size and shape of your heart and how well your heart's chambers and valves are working. This procedure takes approximately one hour. There are no restrictions for this procedure. To be done in July 2016

## 2014-02-26 NOTE — Progress Notes (Signed)
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 She was referred in May of 2010 for cardiology risk assessment prior to planned thoracotomy which she had in June 2010. Her echo prior to the surgery showed normal LV function with mild diastolic dysfunction and mild AI. She was seen by Truitt Merle, NP on 03/02/11 for c/o palpitations. An event monitor was placed and this showed PACs with short run 9 beat run of VT. Lower extremity arterial dopplers were normal February 2013. She had some lower extremity edema in 2013 and was started on Lasix by primary care. Echo July 2014 with normal LV function, mild MR, mild AI.   She is here today for follow up.  No chest pain, SOB. She feels well overall. Lower ext edema resolved. She has had no palpitations.   Primary Care Physician: Cathlean Cower  Last Lipid Profile:Lipid Panel     Component Value Date/Time   CHOL 141 04/11/2012 1041   TRIG 108.0 04/11/2012 1041   HDL 46.50 04/11/2012 1041   CHOLHDL 3 04/11/2012 1041   VLDL 21.6 04/11/2012 1041   LDLCALC 73 04/11/2012 1041     Past Medical History  Diagnosis Date  . HYPERLIPIDEMIA 11/18/2006  . HYPERKALEMIA 02/05/2008  . ANEMIA-IRON DEFICIENCY 09/22/2008  . Anemia of other chronic disease 02/05/2008  . ANXIETY 06/03/2008  . DEPRESSION 04/28/2009  . HYPERTENSION 11/18/2006  . PERICARDITIS 01/03/2007  . AORTIC STENOSIS/ INSUFFICIENCY, NON-RHEUMATIC 12/03/2008  . SINUSITIS- ACUTE-NOS 11/20/2007  . SINUSITIS, CHRONIC 02/05/2008  . ALLERGIC RHINITIS 11/18/2006  . PNEUMONIA 05/02/2007  . C O P D 06/27/2008  . Stricture and stenosis of esophagus 11/14/2008  . GERD 01/03/2007  . BARRETTS ESOPHAGUS 11/14/2008  . DYSPEPSIA 03/23/2007  . PRURITUS 10/08/2008  . OSTEOARTHRITIS 11/18/2006  . OSTEOARTHRITIS, KNEE, LEFT 01/03/2007  . Cervicalgia  01/13/2009  . BACK PAIN 02/04/2009    is improved  . BURSITIS, LEFT HIP 04/18/2009  . OSTEOPOROSIS 11/18/2006  . INSOMNIA-SLEEP DISORDER-UNSPEC 06/03/2008  . FATIGUE 04/18/2009  . PERIPHERAL EDEMA 02/13/2009  . MURMUR 11/18/2006  . DYSPHAGIA UNSPECIFIED 03/23/2007  . Abdominal pain, generalized 03/01/2007  . Nonspecific (abnormal) findings on radiological and other examination of body structure 06/03/2008  . Tuberculin Test Reaction 11/18/2006  . Acute bronchitis 02/23/2010  . CHOLELITHIASIS 04/24/2010  . NEPHROLITHIASIS, HX OF 04/24/2010  . Polyarthralgia 06/09/2010  . Elevated sed rate 06/11/2010  . Positive ANA (antinuclear antibody) 06/11/2010  . Rheumatoid factor positive 06/11/2010  . Cancer     lung ca  . 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 SPONDYLOSIS, CERVICAL, WITH RADICULOPATHY 01/13/2009  . Cervical radiculopathy 09/28/2010  . Lumbar radiculopathy 09/28/2010  . RENAL INSUFFICIENCY 02/05/2008    function has improved.  . Transfusion history 09-01-12    4'14 tranfused x 2 units  . Bleeding nose 09-01-12    past hx. none recent after having surgery  . Wound healing, delayed 09-01-12    right abdominal area    Past Surgical History  Procedure Laterality Date  . Abdominal hysterectomy    . Tubal ligation    . Tumor removal  08/06/08    from lungs  . Anterior cervical decomp/discectomy fusion  01/15/2011    Procedure: ANTERIOR CERVICAL DECOMPRESSION/DISCECTOMY FUSION 2 LEVELS;  Surgeon: Cooper Render Pool;  Location: [place] [facility];  Service: Neurosurgery;  Laterality: N/A;  Cervical Three-Four, Cervical Four-Five Anterior Cervical Decompression Fusion   . Colonscopy    . Nose surgery    . Laparoscopic appendectomy N/A 05/25/2012    Procedure: APPENDECTOMY LAPAROSCOPIC  CONVERTED TO  OPEN APPENDECTOMY, exploratory laparatomy;  Surgeon: Odis Hollingshead, MD;  Location: [facility];  Service: General;  Laterality: N/A;  . Bowel resection N/A 05/25/2012    Procedure: SMALL BOWEL RESECTION;  Surgeon:  Odis Hollingshead, MD;  Location: [facility];  Service: General;  Laterality: N/A;  . Laparoscopic lysis of adhesions N/A 05/25/2012    Procedure: LAPAROSCOPIC LYSIS OF ADHESIONS;  Surgeon: Odis Hollingshead, MD;  Location: [facility];  Service: General;  Laterality: N/A;  . Appendectomy    . Cataract extraction, bilateral  09-01-12  . Debridement of abdominal wall abscess N/A 09/07/2012    Procedure:  ABDOMINAL WOUND IRRIGATION AND TWO LAYERED CLOSURE;  Surgeon: Odis Hollingshead, MD;  Location: [facility];  Service: General;  Laterality: N/A;    Current Outpatient Prescriptions  Medication Sig Dispense Refill  . albuterol (PROVENTIL HFA;VENTOLIN HFA) 108 (90 BASE) MCG/ACT inhaler Inhale 1-2 puffs into the lungs 4 (four) times daily as needed for wheezing or shortness of breath. For wheezing or shortness of breath.    Marland Kitchen alendronate (FOSAMAX) 70 MG tablet Take 1 tablet (70 mg total) by mouth every 7 (seven) days. Take with a full glass of water on an empty stomach. Patient takes on Mondays. 4 tablet 11  . ALPRAZolam (XANAX) 0.5 MG tablet Take 0.5 mg by mouth daily as needed for anxiety.    Marland Kitchen amLODipine (NORVASC) 5 MG tablet TAKE 1 TABLET (5 MG TOTAL) BY MOUTH DAILY. 90 tablet 3  . citalopram (CELEXA) 10 MG tablet Take 1 tablet (10 mg total) by mouth every morning. 90 tablet 2  . furosemide (LASIX) 40 MG tablet TAKE 1 TABLET BY MOUTH 2 TIMES DAILY 180 tablet 3  . metoprolol succinate (TOPROL-XL) 25 MG 24 hr tablet TAKE 1 TABLET (25 MG TOTAL) BY MOUTH EVERY MORNING. 90 tablet 1  . pantoprazole (PROTONIX) 40 MG tablet Take 40 mg by mouth daily.     . pravastatin (PRAVACHOL) 40 MG tablet Take 1 tablet (40 mg total) by mouth daily. 90 tablet 3  . tamsulosin (FLOMAX) 0.4 MG CAPS capsule TAKE ONE CAPSULE BY MOUTH DAILY 30 capsule 5  . traMADol (ULTRAM) 50 MG tablet TAKE 1 TABLET BY MOUTH EVERY 8 HOURS AS NEEDED 60 tablet 2  . zolpidem (AMBIEN) 5 MG tablet Take 1 tablet (5 mg total) by mouth at bedtime as needed. For  sleep. 30 tablet 5   No current facility-administered medications for this visit.    Allergies  Allergen Reactions  . Amoxicillin Itching  . Fentanyl Other (See Comments)    hallucinations  . Hydrocodone Other (See Comments)    ineffective  . Codeine Hives and Rash  . Penicillins Hives and Rash    History   Social History  . Marital Status: Widowed    Spouse Name: N/A    Number of Children: N/A  . Years of Education: N/A   Occupational History  . Not on file.   Social History Main Topics  . Smoking status: Former Smoker -- 1.50 packs/day for 40 years    Types: Cigarettes    Quit date: 02/15/2006  . Smokeless tobacco: Former Systems developer  . Alcohol Use: No  . Drug Use: No  . Sexual Activity: Yes    Birth Control/ Protection: Post-menopausal  Other Topics Concern  . Not on file   Social History Narrative    Family History  Problem Relation Age of Onset  . Hyperlipidemia Other   . Hypertension Other   . Coronary artery disease Other     several nephrew  . Heart disease Father   . Colon cancer Neg Hx   . Esophageal cancer Neg Hx   . Rectal cancer Neg Hx   . Stomach cancer Neg Hx   . Cancer Brother     ?stomach    Review of Systems:  As stated in the HPI and otherwise negative.   BP 110/48 mmHg  Pulse 74  Ht 5\' 1"  (1.549 m)  Wt 177 lb 3.2 oz (80.377 kg)  BMI 33.50 kg/m2  SpO2 90%  Physical Examination: General: Well developed, well nourished, NAD HEENT: OP clear, mucus membranes moist SKIN: warm, dry. No rashes. Neuro: No focal deficits Musculoskeletal: Muscle strength 5/5 all ext Psychiatric: Mood and affect normal Neck: No JVD, no carotid bruits, no thyromegaly, no lymphadenopathy. Lungs:Clear bilaterally, no wheezes, rhonci, crackles Cardiovascular: Regular rate and rhythm. Soft systolic murmur. No gallops or rubs. Abdomen:Soft. Bowel sounds present. Non-tender.  Extremities: No lower extremity edema. Pulses are 2 + in the bilateral DP/PT.  Echo  09/08/12: Left ventricle: The cavity size was normal. Wall thickness was normal. Systolic function was normal. The estimated ejection fraction was in the range of 55% to 60%. Wall motion was normal; there were no regional wall motion abnormalities. Features are consistent with a pseudonormal left ventricular filling pattern, with concomitant abnormal relaxation and increased filling pressure (grade 2 diastolic dysfunction). - Aortic valve: Mild regurgitation. - Mitral valve: Mild regurgitation. - Left atrium: The atrium was mildly dilated. - Pulmonary arteries: PA peak pressure: 29mm Hg (S).  EKG: NSR, rate 74 bpm. Non-specific T wave abnormalities, unchanged.   Assessment and Plan:   1. Non-sustained ventricular tachycardia:  No palpitations. Normal LV function. No recurrence of palpitations. Will continue Toprol.   2. Mitral regurgitation: Mild by echo December 2013. Repeat echo July 2016.   3. Chronic diastolic CHF:  Weight stable. Continue Lasix 80 mg daily.  4. Aortic insufficiency: Mild by echo July 2014. Repeat echo July 2016.

## 2014-02-28 ENCOUNTER — Encounter: Payer: Self-pay | Admitting: Internal Medicine

## 2014-02-28 ENCOUNTER — Ambulatory Visit (INDEPENDENT_AMBULATORY_CARE_PROVIDER_SITE_OTHER): Payer: Medicare HMO | Admitting: Internal Medicine

## 2014-02-28 VITALS — BP 130/82 | HR 74 | Temp 98.3°F | Ht 61.0 in | Wt 177.5 lb

## 2014-02-28 DIAGNOSIS — I10 Essential (primary) hypertension: Secondary | ICD-10-CM

## 2014-02-28 DIAGNOSIS — J449 Chronic obstructive pulmonary disease, unspecified: Secondary | ICD-10-CM

## 2014-02-28 DIAGNOSIS — M25551 Pain in right hip: Secondary | ICD-10-CM | POA: Insufficient documentation

## 2014-02-28 DIAGNOSIS — M79605 Pain in left leg: Secondary | ICD-10-CM

## 2014-02-28 NOTE — Progress Notes (Signed)
Pre visit review using our clinic review tool, if applicable. No additional management support is needed unless otherwise documented below in the visit note. 

## 2014-02-28 NOTE — Patient Instructions (Addendum)
Please continue all other medications as before, and refills have been done if requested.  Please have the pharmacy call with any other refills you may need.  Please continue your efforts at being more active, low cholesterol diet, and weight control.  Please keep your appointments with your specialists as you may have planned  Please return in 6 months, or sooner if needed 

## 2014-02-28 NOTE — Progress Notes (Signed)
Subjective:    Patient ID: [name], female    DOB: 1940/06/04, 74 y.o.   MRN: [id number]  HPI  Here with bilat hip pain for 3 wks, sharp, mild to mod, worse to walk, better to sit, nothing else makes better/worse except right side is tender to palpate and worse to lie on right side.  Pt denies chest pain, increased sob or doe, wheezing, orthopnea, PND, increased LE swelling, palpitations, dizziness or syncope.   Pt denies polydipsia, polyuria, Pt denies new neurological symptoms such as new headache, or facial or extremity weakness or numbness Past Medical History  Diagnosis Date  . HYPERLIPIDEMIA 11/18/2006  . HYPERKALEMIA 02/05/2008  . ANEMIA-IRON DEFICIENCY 09/22/2008  . Anemia of other chronic disease 02/05/2008  . ANXIETY 06/03/2008  . DEPRESSION 04/28/2009  . HYPERTENSION 11/18/2006  . PERICARDITIS 01/03/2007  . AORTIC STENOSIS/ INSUFFICIENCY, NON-RHEUMATIC 12/03/2008  . SINUSITIS- ACUTE-NOS 11/20/2007  . SINUSITIS, CHRONIC 02/05/2008  . ALLERGIC RHINITIS 11/18/2006  . PNEUMONIA 05/02/2007  . C O P D 06/27/2008  . Stricture and stenosis of esophagus 11/14/2008  . GERD 01/03/2007  . BARRETTS ESOPHAGUS 11/14/2008  . DYSPEPSIA 03/23/2007  . PRURITUS 10/08/2008  . OSTEOARTHRITIS 11/18/2006  . OSTEOARTHRITIS, KNEE, LEFT 01/03/2007  . Cervicalgia 01/13/2009  . BACK PAIN 02/04/2009    is improved  . BURSITIS, LEFT HIP 04/18/2009  . OSTEOPOROSIS 11/18/2006  . INSOMNIA-SLEEP DISORDER-UNSPEC 06/03/2008  . FATIGUE 04/18/2009  . PERIPHERAL EDEMA 02/13/2009  . MURMUR 11/18/2006  . DYSPHAGIA UNSPECIFIED 03/23/2007  . Abdominal pain, generalized 03/01/2007  . Nonspecific (abnormal) findings on radiological and other examination of body structure 06/03/2008  . Tuberculin Test Reaction 11/18/2006  . Acute bronchitis 02/23/2010  . CHOLELITHIASIS 04/24/2010  . NEPHROLITHIASIS, HX OF 04/24/2010  . Polyarthralgia 06/09/2010  . Elevated sed rate 06/11/2010  . Positive ANA (antinuclear antibody) 06/11/2010  .  Rheumatoid factor positive 06/11/2010  . Cancer     lung ca  . 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 SPONDYLOSIS, CERVICAL, WITH RADICULOPATHY 01/13/2009  . Cervical radiculopathy 09/28/2010  . Lumbar radiculopathy 09/28/2010  . RENAL INSUFFICIENCY 02/05/2008    function has improved.  . Transfusion history 09-01-12    4'14 tranfused x 2 units  . Bleeding nose 09-01-12    past hx. none recent after having surgery  . Wound healing, delayed 09-01-12    right abdominal area   Past Surgical History  Procedure Laterality Date  . Abdominal hysterectomy    . Tubal ligation    . Tumor removal  08/06/08    from lungs  . Anterior cervical decomp/discectomy fusion  01/15/2011    Procedure: ANTERIOR CERVICAL DECOMPRESSION/DISCECTOMY FUSION 2 LEVELS;  Surgeon: Cooper Render Pool;  Location: [facility];  Service: Neurosurgery;  Laterality: N/A;  Cervical Three-Four, Cervical Four-Five Anterior Cervical Decompression Fusion   . Colonscopy    . Nose surgery    . Laparoscopic appendectomy N/A 05/25/2012    Procedure: APPENDECTOMY LAPAROSCOPIC  CONVERTED TO  OPEN APPENDECTOMY, exploratory laparatomy;  Surgeon: Odis Hollingshead, MD;  Location: [facility];  Service: General;  Laterality: N/A;  . Bowel resection N/A 05/25/2012    Procedure: SMALL BOWEL RESECTION;  Surgeon: Odis Hollingshead, MD;  Location: [facility];  Service: General;  Laterality: N/A;  . Laparoscopic lysis of adhesions N/A 05/25/2012    Procedure: LAPAROSCOPIC LYSIS OF ADHESIONS;  Surgeon: Odis Hollingshead, MD;  Location: [facility];  Service: General;  Laterality: N/A;  . Appendectomy    .  Cataract extraction, bilateral  09-01-12  . Debridement of abdominal wall abscess N/A 09/07/2012    Procedure:  ABDOMINAL WOUND IRRIGATION AND TWO LAYERED CLOSURE;  Surgeon: Odis Hollingshead, MD;  Location: [facility];  Service: General;  Laterality: N/A;    reports that she quit smoking about 8 years ago. Her smoking use included Cigarettes. She has a 60 pack-year  smoking history. She has quit using smokeless tobacco. She reports that she does not drink alcohol or use illicit drugs. family history includes Cancer in her brother; Coronary artery disease in her other; Heart disease in her father; Hyperlipidemia in her other; Hypertension in her other. There is no history of Colon cancer, Esophageal cancer, Rectal cancer, or Stomach cancer. Allergies  Allergen Reactions  . Amoxicillin Itching  . Fentanyl Other (See Comments)    hallucinations  . Hydrocodone Other (See Comments)    ineffective  . Codeine Hives and Rash  . Penicillins Hives and Rash   Current Outpatient Prescriptions on File Prior to Visit  Medication Sig Dispense Refill  . albuterol (PROVENTIL HFA;VENTOLIN HFA) 108 (90 BASE) MCG/ACT inhaler Inhale 1-2 puffs into the lungs 4 (four) times daily as needed for wheezing or shortness of breath. For wheezing or shortness of breath.    Marland Kitchen alendronate (FOSAMAX) 70 MG tablet Take 1 tablet (70 mg total) by mouth every 7 (seven) days. Take with a full glass of water on an empty stomach. Patient takes on Mondays. 4 tablet 11  . ALPRAZolam (XANAX) 0.5 MG tablet Take 0.5 mg by mouth daily as needed for anxiety.    Marland Kitchen amLODipine (NORVASC) 5 MG tablet TAKE 1 TABLET (5 MG TOTAL) BY MOUTH DAILY. 90 tablet 3  . citalopram (CELEXA) 10 MG tablet Take 1 tablet (10 mg total) by mouth every morning. 90 tablet 2  . furosemide (LASIX) 40 MG tablet TAKE 1 TABLET BY MOUTH 2 TIMES DAILY 180 tablet 3  . metoprolol succinate (TOPROL-XL) 25 MG 24 hr tablet TAKE 1 TABLET (25 MG TOTAL) BY MOUTH EVERY MORNING. 90 tablet 1  . pantoprazole (PROTONIX) 40 MG tablet Take 40 mg by mouth daily.     . pravastatin (PRAVACHOL) 40 MG tablet Take 1 tablet (40 mg total) by mouth daily. 90 tablet 3  . tamsulosin (FLOMAX) 0.4 MG CAPS capsule TAKE ONE CAPSULE BY MOUTH DAILY 30 capsule 5  . traMADol (ULTRAM) 50 MG tablet TAKE 1 TABLET BY MOUTH EVERY 8 HOURS AS NEEDED 60 tablet 2  . zolpidem  (AMBIEN) 5 MG tablet Take 1 tablet (5 mg total) by mouth at bedtime as needed. For sleep. 30 tablet 5   No current facility-administered medications on file prior to visit.   Review of Systems  Constitutional: Negative for unusual diaphoresis or other sweats  HENT: Negative for ringing in ear Eyes: Negative for double vision or worsening visual disturbance.  Respiratory: Negative for choking and stridor.   Gastrointestinal: Negative for vomiting or other signifcant bowel change Genitourinary: Negative for hematuria or decreased urine volume.  Musculoskeletal: Negative for other MSK pain or swelling Skin: Negative for color change and worsening wound.  Neurological: Negative for tremors and numbness other than noted  Psychiatric/Behavioral: Negative for decreased concentration or agitation other than above       Objective:   Physical Exam BP 130/82 mmHg  Pulse 74  Temp(Src) 98.3 F (36.8 C) (Oral)  Ht 5\' 1"  (1.549 m)  Wt 177 lb 8 oz (80.513 kg)  BMI 33.56 kg/m2  SpO2 90%  VS noted,  Constitutional: Pt appears well-developed, well-nourished.  HENT: Head: NCAT.  Right Ear: External ear normal.  Left Ear: External ear normal.  Eyes: . Pupils are equal, round, and reactive to light. Conjunctivae and EOM are normal Neck: Normal range of motion. Neck supple.  Cardiovascular: Normal rate and regular rhythm.   Pulmonary/Chest: Effort normal and breath sounds without rales or wheezing. , decreased bilat Abd:  Soft, NT, ND, + BS Neurological: Pt is alert. Not confused , motor grossly intact Skin: Skin is warm. No rash Psychiatric: Pt behavior is normal. No agitation.  Right hip tender over right greater trochanter,  Left hip NT but with reduced ROM with pain on flexion    Assessment & Plan:

## 2014-03-03 NOTE — Assessment & Plan Note (Signed)
stable overall by history and exam, recent data reviewed with pt, and pt to continue medical treatment as before,  to f/u any worsening symptoms or concerns BP Readings from Last 3 Encounters:  02/28/14 130/82  02/26/14 110/48  11/06/13 132/78

## 2014-03-03 NOTE — Assessment & Plan Note (Signed)
stable overall by history and exam, recent data reviewed with pt, and pt to continue medical treatment as before,  to f/u any worsening symptoms or concerns SpO2 Readings from Last 3 Encounters:  02/28/14 90%  02/26/14 90%  11/06/13 92%

## 2014-03-03 NOTE — Assessment & Plan Note (Signed)
?   DJD vs other, for pain control, sport med referral

## 2014-03-03 NOTE — Assessment & Plan Note (Signed)
?   Bursitis, for pain control, refer sport med further evaluation

## 2014-03-12 ENCOUNTER — Ambulatory Visit: Payer: Medicare HMO | Admitting: Family Medicine

## 2014-03-15 ENCOUNTER — Encounter: Payer: Self-pay | Admitting: Family Medicine

## 2014-03-15 ENCOUNTER — Other Ambulatory Visit: Payer: Self-pay

## 2014-03-15 ENCOUNTER — Ambulatory Visit (INDEPENDENT_AMBULATORY_CARE_PROVIDER_SITE_OTHER)
Admission: RE | Admit: 2014-03-15 | Discharge: 2014-03-15 | Disposition: A | Discharge status: Home / Self Care | Payer: Medicare HMO | Source: Ambulatory Visit | Attending: Family Medicine | Admitting: Family Medicine

## 2014-03-15 ENCOUNTER — Ambulatory Visit (INDEPENDENT_AMBULATORY_CARE_PROVIDER_SITE_OTHER): Payer: Medicare HMO | Admitting: Family Medicine

## 2014-03-15 VITALS — BP 100/58 | HR 76 | Wt 177.0 lb

## 2014-03-15 DIAGNOSIS — R059 Cough, unspecified: Secondary | ICD-10-CM

## 2014-03-15 DIAGNOSIS — R05 Cough: Secondary | ICD-10-CM

## 2014-03-15 DIAGNOSIS — M75102 Unspecified rotator cuff tear or rupture of left shoulder, not specified as traumatic: Secondary | ICD-10-CM

## 2014-03-15 DIAGNOSIS — M25562 Pain in left knee: Secondary | ICD-10-CM

## 2014-03-15 DIAGNOSIS — M179 Osteoarthritis of knee, unspecified: Secondary | ICD-10-CM

## 2014-03-15 DIAGNOSIS — M25561 Pain in right knee: Secondary | ICD-10-CM

## 2014-03-15 DIAGNOSIS — M1710 Unilateral primary osteoarthritis, unspecified knee: Secondary | ICD-10-CM

## 2014-03-15 DIAGNOSIS — M12812 Other specific arthropathies, not elsewhere classified, left shoulder: Secondary | ICD-10-CM

## 2014-03-15 DIAGNOSIS — M75101 Unspecified rotator cuff tear or rupture of right shoulder, not specified as traumatic: Secondary | ICD-10-CM

## 2014-03-15 DIAGNOSIS — M129 Arthropathy, unspecified: Secondary | ICD-10-CM

## 2014-03-15 DIAGNOSIS — M12811 Other specific arthropathies, not elsewhere classified, right shoulder: Secondary | ICD-10-CM

## 2014-03-15 DIAGNOSIS — IMO0002 Reserved for concepts with insufficient information to code with codable children: Secondary | ICD-10-CM

## 2014-03-15 MED ORDER — METOPROLOL SUCCINATE ER 25 MG PO TB24: ORAL_TABLET | ORAL | Status: DC

## 2014-03-15 NOTE — Progress Notes (Signed)
Pre visit review using our clinic review tool, if applicable. No additional management support is needed unless otherwise documented below in the visit note. 

## 2014-03-15 NOTE — Assessment & Plan Note (Signed)
Patient does have bilateral knee arthritis. Patient was given a medial unloader brace to try today. We discussed topical anti-inflammatories as well as home exercises and icing protocol. Patient declined formal physical therapy. Patient try to make these different changes patient and will come back again in 2-3 weeks. Continued have pain we can consider injections. X-rays ordered today.

## 2014-03-15 NOTE — Progress Notes (Signed)
Followup right  shoulder pain follow up.   HPI: Patient is a 74 year old female with significant comorbidities and multiple medical problems coming in with followup of right shoulder pain. 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 Patient did have a corticosteroid injection back in August of last year and was doing significant well.. Patient states that the pain seems to be going to both shoulders. Describes it as a dull throbbing aching pain. Patient states it is becoming harder to do certain activities such as dress herself. Denies any worsening shortness of breath, denies any numbness or tingling the upper extremities.  Patient is also complaining of bilateral knee pain. Patient states  knees seem to be hurting her more than usual. Patient states that with walking or going to stand for long amount of time she has pain mostly on the medial aspect of both knees. Denies any numbness or detailing. Denies any recent weight loss. Patient states though that is becoming somewhat difficult to walk on a regular basis. Patient is accompanied with daughter who states that she complains more about the shoulders and she does about the knees. Patient denies ever feeling like they're going to give out on her. Denies any nighttime awakening secondary to the pain.  Patient has many other medical problems including carcinoma of the lung and congestive heart failure. Patient has also had cervical cord compression with myelopathy.   Past medical, surgical, family and social history reviewed. Medications reviewed all in the electronic medical record.   Review of Systems: No headache, visual changes, nausea, vomiting, diarrhea, constipation, dizziness, abdominal pain, skin rash, fevers, chills, night sweats, weight loss, swollen lymph nodes, body aches, joint swelling, muscle aches, chest pain, shortness of breath, mood changes.   Objective:    Blood pressure  100/58, pulse 76, weight 177 lb (80.287 kg), SpO2 70 %.   General: No apparent distress alert and oriented x3 mood and affect normal, dressed appropriately. Patient does have some increased kyphosis HEENT: Pupils equal, extraocular movements intact Respiratory: Patient's speak in full sentences and does not appear short of breath Cardiovascular: No lower extremity edema, non tender, no erythema Skin: Warm dry intact with no signs of infection or rash on extremities or on axial skeleton. Abdomen: Soft nontender Neuro: Cranial nerves II through XII are intact, neurovascularly intact in all extremities with 2+ DTRs and 2+ pulses. Lymph: No lymphadenopathy of posterior or anterior cervical chain or axillae bilaterally.  Gait patient does walk with a cane but does have good balance  MSK: Non tender with full range of motion and good stability and symmetric strength and tone of, elbows, wrist, hip, knee and ankles bilaterally.  Shoulder: Bilateral Inspection reveals no abnormalities, atrophy or asymmetry. Palpation is normal with no tenderness over AC joint or bicipital groove. Patient is only able to do active forward flexion to 90, abduction to 80, internal rotation to hip. Contralateral side has full range of motion but does have crepitus The patient's rotator cuff strength is 4/5 with positive drop arm sign. Patient has 5 out of 5 strength of the contralateral side Positive painful arc Positive empty can severe crepitus on range of motion Speeds and Yergason's tests normal. No labral pathology noted with negative Obrien's, negative clunk and good stability. Normal scapular function observed. No specific change from previous exam. Knee: Bilaterally Mild valgus deformity bilaterally Tender to palpation mostly of the medial joint line. ROM full in flexion and extension and lower leg rotation.  Ligaments with solid consistent endpoints including ACL, PCL, LCL, MCL. Negative Mcmurray's,  Apley's, and Thessalonian tests.  painful patellar compression. Patellar glide with moderate crepitus. Patellar and quadriceps tendons unremarkable. Hamstring and quadriceps strength is normal.    Procedure: Real-time Ultrasound Guided Injection of right glenohumeral joint Device: GE Logiq E  Ultrasound guided injection is preferred based studies that show increased duration, increased effect, greater accuracy, decreased procedural pain, increased response rate with ultrasound guided versus blind injection.  Verbal informed consent obtained.  Time-out conducted.  Noted no overlying erythema, induration, or other signs of local infection.  Skin prepped in a sterile fashion.  Local anesthesia: Topical Ethyl chloride.  With sterile technique and under real time ultrasound guidance:  Joint visualized.  23g 1  inch needle inserted posterior approach. Pictures taken for needle placement. Patient did have injection of 2 cc of 1% lidocaine, 2 cc of 0.5% Marcaine, and 1.0 cc of Kenalog 40 mg/dL. Completed without difficulty  Pain immediately resolved suggesting accurate placement of the medication.  Advised to call if fevers/chills, erythema, induration, drainage, or persistent bleeding.  Images permanently stored and available for review in the ultrasound unit.  Impression: Technically successful ultrasound guided injection.  Procedure: Real-time Ultrasound Guided Injection of left glenohumeral joint Device: GE Logiq E  Ultrasound guided injection is preferred based studies that show increased duration, increased effect, greater accuracy, decreased procedural pain, increased response rate with ultrasound guided versus blind injection.  Verbal informed consent obtained.  Time-out conducted.  Noted no overlying erythema, induration, or other signs of local infection.  Skin prepped in a sterile fashion.  Local anesthesia: Topical Ethyl chloride.  With sterile technique and under real time  ultrasound guidance:  Joint visualized.  23g 1  inch needle inserted posterior approach. Pictures taken for needle placement. Patient did have injection of 2 cc of 1% lidocaine, 2 cc of 0.5% Marcaine, and 1cc of Kenalog 40 mg/dL. Completed without difficulty  Pain immediately resolved suggesting accurate placement of the medication.  Advised to call if fevers/chills, erythema, induration, drainage, or persistent bleeding.  Images permanently stored and available for review in the ultrasound unit.  Impression: Technically successful ultrasound guided injection.    Impression and Recommendations:     This case required medical decision making of moderate complexity.

## 2014-03-15 NOTE — Assessment & Plan Note (Signed)
Patient's was given shoulder injections today which will hopefully be helpful. Differential also includes cervical radiculopathy. Patient does have cervical cord compression with myelopathy all as a diagnosis previously. Patient may need an epidural steroid injection if this continues to give her difficult he. Patient though will do the home exercises that she's been given previously. We can repeat the steroid injection every 3 months if needed. Patient is not a surgical candidate for anything such as a shoulder repair. We will continue to monitor with this conservative therapy.

## 2014-03-15 NOTE — Patient Instructions (Signed)
Very good to see you 2 xrays downstairs today.  Ice 20 minutes 2 times daily. Usually after activity and before bed. Try pennsaid twice daily as needed Come back in 2-3 weeks if knees still hurting then will try injections.

## 2014-03-21 ENCOUNTER — Other Ambulatory Visit: Payer: Self-pay | Admitting: Internal Medicine

## 2014-03-29 ENCOUNTER — Ambulatory Visit: Payer: Medicare HMO | Admitting: Family Medicine

## 2014-04-01 ENCOUNTER — Ambulatory Visit: Payer: Medicare HMO | Admitting: Family Medicine

## 2014-04-03 ENCOUNTER — Telehealth: Payer: Self-pay | Admitting: *Deleted

## 2014-04-03 NOTE — Telephone Encounter (Signed)
[facility] - Client TELEPHONE ADVICE RECORD [facility] Patient Name: [name] Gender: Female DOB: 04/09/1940 Age: 74 Y 45 M 2 D Return Phone Number: [phone] (Primary) Address: [address] City/State/Zip: [place] [address] Client [facility] - Client Client Site [place] - Night Physician [place], [place] Type Call [place] Name [place] Phone Number [phone] Relationship To Patient Self Is this call to report lab results? No Call Type General Information Initial Comment Caller states she needs to cancel an appt for today @ 11:00 a.m . with Dr. Faythe Casa. General Information Type Message Only Nurse Assessment Guidelines Guideline Title Affirmed Question Affirmed Notes Nurse Date/Time (Eastern Time) Disp. Time Eilene Ghazi Time) Disposition Final User 04/01/2014 8:50:54 AM General Information Provided Yes Laney Pastor After Care Instructions Given Call Event Type User Date / Time Description

## 2014-04-09 ENCOUNTER — Encounter: Payer: Self-pay | Admitting: Family Medicine

## 2014-04-09 ENCOUNTER — Ambulatory Visit (INDEPENDENT_AMBULATORY_CARE_PROVIDER_SITE_OTHER): Payer: Medicare HMO | Admitting: Family Medicine

## 2014-04-09 VITALS — BP 114/60 | HR 72 | Ht 61.0 in | Wt 171.0 lb

## 2014-04-09 DIAGNOSIS — IMO0002 Reserved for concepts with insufficient information to code with codable children: Secondary | ICD-10-CM

## 2014-04-09 DIAGNOSIS — M75102 Unspecified rotator cuff tear or rupture of left shoulder, not specified as traumatic: Secondary | ICD-10-CM

## 2014-04-09 DIAGNOSIS — M12811 Other specific arthropathies, not elsewhere classified, right shoulder: Secondary | ICD-10-CM

## 2014-04-09 DIAGNOSIS — M179 Osteoarthritis of knee, unspecified: Secondary | ICD-10-CM

## 2014-04-09 DIAGNOSIS — M12812 Other specific arthropathies, not elsewhere classified, left shoulder: Secondary | ICD-10-CM

## 2014-04-09 DIAGNOSIS — M75101 Unspecified rotator cuff tear or rupture of right shoulder, not specified as traumatic: Secondary | ICD-10-CM

## 2014-04-09 DIAGNOSIS — M1710 Unilateral primary osteoarthritis, unspecified knee: Secondary | ICD-10-CM

## 2014-04-09 NOTE — Assessment & Plan Note (Signed)
Doing much better after injections at this time. Patient will come back and see me again in 3-4 weeks for further evaluation. Patient would not want to have surgical intervention.

## 2014-04-09 NOTE — Assessment & Plan Note (Signed)
She was given bilateral injections today. We discussed icing regimen at home exercises. We discussed avoiding a significant increase in activity over the course of the 24 hours. We discussed topical anti-inflammatories and over-the-counter medications that may be helpful as well. Patient will come back in 3-4 weeks. Patient continues to have difficulty she would be a candidate for viscous supplementation.

## 2014-04-09 NOTE — Progress Notes (Signed)
Pre visit review using our clinic review tool, if applicable. No additional management support is needed unless otherwise documented below in the visit note. 

## 2014-04-09 NOTE — Patient Instructions (Signed)
We did injections in the knees today Add tylenol 500mg  3 times daily will help and when taking the tramadol take it with a tylenol Ice 20 minutes 2 times daily. Usually after activity and before bed. Stay active and wear good shoes.  See me again in 4 weeks.

## 2014-04-09 NOTE — Progress Notes (Signed)
Followup bilateral shoulder pain  HPI: Patient is a 74 year old female with significant comorbidities and multiple medical problems coming in with followup of right shoulder pain. 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 Patient did have a corticosteroid injection in both shoulders one month ago. Patient states she is doing approximately 80% better. Patient still has lack of range of motion and still some pain at baseline but significantly better than it was previously.  Patient is also complaining of bilateral knee pain. Patient has been seen previously for this. 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 Patient states that her knees are stopping her from certain activities. Patient is having a dull throbbing aching pain that even can give her some pain at rest but is significant worse with walking. Patient states that most of it seems to be on the medial aspect of the knees.  Patient has many other medical problems including carcinoma of the lung and congestive heart failure. Patient has also had cervical cord compression with myelopathy.   Past medical, surgical, family and social history reviewed. Medications reviewed all in the electronic medical record.   Review of Systems: No headache, visual changes, nausea, vomiting, diarrhea, constipation, dizziness, abdominal pain, skin rash, fevers, chills, night sweats, weight loss, swollen lymph nodes, body aches, joint swelling, muscle aches, chest pain, shortness of breath, mood changes.   Objective:    Blood pressure 114/60, pulse 72, height 5\' 1"  (1.549 m), weight 171 lb (77.565 kg), SpO2 89 %.   General: No apparent distress alert and oriented x3 mood and affect normal, dressed appropriately. Patient does  have some increased kyphosis HEENT: Pupils equal, extraocular movements intact Respiratory: Patient's speak in full sentences and does not appear short of breath Cardiovascular: No lower extremity edema, non tender, no erythema Skin: Warm dry intact with no signs of infection or rash on extremities or on axial skeleton. Abdomen: Soft nontender Neuro: Cranial nerves II through XII are intact, neurovascularly intact in all extremities with 2+ DTRs and 2+ pulses. Lymph: No lymphadenopathy of posterior or anterior cervical chain or axillae bilaterally.  Gait patient does walk with a cane but does have good balance  MSK: Non tender with full range of motion and good stability and symmetric strength and tone of, elbows, wrist, hip, knee and ankles bilaterally.  Shoulder: Bilateral Inspection reveals no abnormalities, atrophy or asymmetry. Palpation is normal with no tenderness over AC joint or bicipital groove. Patient is only able to do active forward flexion to 90, abduction to 80, internal rotation to hip. Contralateral side has full range of motion but does have crepitus The patient's rotator cuff strength is 4/5 with positive drop arm sign. Patient has 5 out of 5 strength of the contralateral side Positive empty can severe crepitus on range of motion Speeds and Yergason's tests normal. No labral pathology noted with negative Obrien's, negative clunk and good stability. Normal scapular function observed. No specific change from previous exam. Knee: Bilaterally Mild valgus deformity bilaterally Tender to palpation mostly of the medial joint line. ROM full in flexion and extension and lower leg rotation. Ligaments with solid consistent endpoints including ACL, PCL, LCL, MCL. Negative Mcmurray's, Apley's, and Thessalonian tests.  painful patellar compression. Patellar glide with moderate crepitus. Patellar and quadriceps tendons unremarkable. Hamstring and  quadriceps strength is normal.     After informed written and verbal consent, patient was seated on exam table. Right knee was prepped with alcohol swab and utilizing anterolateral approach, patient's right knee space was injected with 4:1  marcaine 0.5%: Kenalog 40mg /dL. Patient tolerated the procedure well without immediate complications.  After informed written and verbal consent, patient was seated on exam table. Left knee was prepped with alcohol swab and utilizing anterolateral approach, patient's left knee space was injected with 4:1  marcaine 0.5%: Kenalog 40mg /dL. Patient tolerated the procedure well without immediate complications.    Impression and Recommendations:     This case required medical decision making of moderate complexity.

## 2014-04-26 ENCOUNTER — Ambulatory Visit: Payer: Medicare HMO | Admitting: Internal Medicine

## 2014-05-20 ENCOUNTER — Other Ambulatory Visit: Payer: Self-pay | Admitting: Internal Medicine

## 2014-05-21 NOTE — Telephone Encounter (Signed)
Done

## 2014-05-21 NOTE — Telephone Encounter (Signed)
Done hardcopy to Cherina  

## 2014-06-04 ENCOUNTER — Ambulatory Visit (HOSPITAL_COMMUNITY)
Admission: RE | Admit: 2014-06-04 | Discharge: 2014-06-04 | Disposition: A | Discharge status: Home / Self Care | Payer: Medicare HMO | Source: Ambulatory Visit | Attending: Internal Medicine | Admitting: Internal Medicine

## 2014-06-04 ENCOUNTER — Other Ambulatory Visit (HOSPITAL_BASED_OUTPATIENT_CLINIC_OR_DEPARTMENT_OTHER): Payer: Medicare HMO

## 2014-06-04 DIAGNOSIS — C3490 Malignant neoplasm of unspecified part of unspecified bronchus or lung: Secondary | ICD-10-CM | POA: Insufficient documentation

## 2014-06-04 DIAGNOSIS — Z85118 Personal history of other malignant neoplasm of bronchus and lung: Secondary | ICD-10-CM | POA: Diagnosis not present

## 2014-06-04 LAB — CBC WITH DIFFERENTIAL/PLATELET
BASO%: 0.7 % (ref 0.0–2.0)
Basophils Absolute: 0 10*3/uL (ref 0.0–0.1)
EOS ABS: 0.2 10*3/uL (ref 0.0–0.5)
EOS%: 3.2 % (ref 0.0–7.0)
HCT: 35.7 % (ref 34.8–46.6)
HEMOGLOBIN: 11.6 g/dL (ref 11.6–15.9)
LYMPH%: 24.8 % (ref 14.0–49.7)
MCH: 31.6 pg (ref 25.1–34.0)
MCHC: 32.5 g/dL (ref 31.5–36.0)
MCV: 97.3 fL (ref 79.5–101.0)
MONO#: 0.6 10*3/uL (ref 0.1–0.9)
MONO%: 11.1 % (ref 0.0–14.0)
NEUT#: 3.4 10*3/uL (ref 1.5–6.5)
NEUT%: 60.2 % (ref 38.4–76.8)
PLATELETS: 205 10*3/uL (ref 145–400)
RBC: 3.67 10*6/uL — AB (ref 3.70–5.45)
RDW: 16.5 % — ABNORMAL HIGH (ref 11.2–14.5)
WBC: 5.7 10*3/uL (ref 3.9–10.3)
lymph#: 1.4 10*3/uL (ref 0.9–3.3)

## 2014-06-04 LAB — COMPREHENSIVE METABOLIC PANEL (CC13)
ALK PHOS: 56 U/L (ref 40–150)
ALT: 15 U/L (ref 0–55)
ANION GAP: 13 meq/L — AB (ref 3–11)
AST: 11 U/L (ref 5–34)
Albumin: 3.7 g/dL (ref 3.5–5.0)
BUN: 43.5 mg/dL — ABNORMAL HIGH (ref 7.0–26.0)
CO2: 23 meq/L (ref 22–29)
Calcium: 9.1 mg/dL (ref 8.4–10.4)
Chloride: 107 mEq/L (ref 98–109)
Creatinine: 2.2 mg/dL — ABNORMAL HIGH (ref 0.6–1.1)
EGFR: 25 mL/min/{1.73_m2} — ABNORMAL LOW (ref >90)
GLUCOSE: 86 mg/dL (ref 70–140)
Potassium: 4.5 mEq/L (ref 3.5–5.1)
SODIUM: 143 meq/L (ref 136–145)
TOTAL PROTEIN: 7 g/dL (ref 6.4–8.3)
Total Bilirubin: 0.25 mg/dL (ref 0.20–1.20)

## 2014-06-06 ENCOUNTER — Encounter: Payer: Self-pay | Admitting: Family Medicine

## 2014-06-06 ENCOUNTER — Ambulatory Visit (INDEPENDENT_AMBULATORY_CARE_PROVIDER_SITE_OTHER): Payer: Medicare HMO

## 2014-06-06 ENCOUNTER — Ambulatory Visit (INDEPENDENT_AMBULATORY_CARE_PROVIDER_SITE_OTHER): Payer: Medicare HMO | Admitting: Family Medicine

## 2014-06-06 VITALS — BP 108/70 | HR 64 | Ht 61.0 in | Wt 175.0 lb

## 2014-06-06 DIAGNOSIS — Z0000 Encounter for general adult medical examination without abnormal findings: Secondary | ICD-10-CM

## 2014-06-06 DIAGNOSIS — M12811 Other specific arthropathies, not elsewhere classified, right shoulder: Secondary | ICD-10-CM | POA: Diagnosis not present

## 2014-06-06 DIAGNOSIS — M75101 Unspecified rotator cuff tear or rupture of right shoulder, not specified as traumatic: Principal | ICD-10-CM

## 2014-06-06 NOTE — Patient Instructions (Signed)
Good to see you Ice is your friend continue the exercises See me again in 3 weeks and we can do the knees again.  Have a great weekend.

## 2014-06-06 NOTE — Progress Notes (Signed)
Pre visit review using our clinic review tool, if applicable. No additional management support is needed unless otherwise documented below in the visit note. 

## 2014-06-06 NOTE — Assessment & Plan Note (Signed)
Patient was given an injection today. Discussed that we will continue to do this a primary approximately 3 months as needed. Patient does not want any surgical intervention. Patient is unable to go to formal physical therapy and a regular basis. Patient will come back again in 3-4 weeks for follow-up on this.

## 2014-06-06 NOTE — Patient Instructions (Addendum)
[name] , Thank you for taking time to come for your Medicare Wellness Visit. I appreciate your ongoing commitment to your health goals. Please review the following plan we discussed and let me know if I can assist you in the future.  The  These are the goals we discussed:  The patient agrees to: 1. fup with Dr. Jenny Reichmann in July in regard to need for Dexa scan; currently taking med as ordered 2. Will attempt to continue to walk and stay mobile in home via housekeeping and may start a patio garden for fun 3. Will have son will check on insurance regarding shingles due to cost.  Advanced directive: completed POA and will bring this back to the office in July   Goals    . 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 Shingles Vaccine  06/27/2000  . DEXA scan (bone density measurement)  06/27/2005  . Flu Shot  09/16/2014  . Mammogram  12/05/2015  . Tetanus Vaccine  04/23/2020  . Colon Cancer Screening  04/27/2021  . Pneumonia vaccines  Completed     Bone Densitometry Bone densitometry is a special X-ray that measures your bone density and can be used to help predict your risk of bone fractures. This test is used to determine bone mineral content and density to diagnose osteoporosis. Osteoporosis is the loss of bone that may cause the bone to become weak. Osteoporosis commonly occurs in women entering menopause. However, it may be found in men and in people with other diseases. PREPARATION FOR TEST No preparation necessary. WHO SHOULD BE TESTED?  All women older than 52.  Postmenopausal women (50 to 55) with risk factors for osteoporosis.  People with a  previous fracture caused by normal activities.  People with a small body frame (less than 127 poundsor a body mass index [BMI] of less than 21).  People who have a parent with a hip fracture or history of osteoporosis.  People who smoke.  People who have rheumatoid arthritis.  Anyone who engages in excessive alcohol use (more than 3 drinks most days).  Women who experience early menopause. WHEN SHOULD YOU BE RETESTED? Current guidelines suggest that you should wait at least 2 years before doing a bone density test again if your first test was normal.Recent studies indicated that women with normal bone density may be able to wait a few years before needing to repeat a bone density test. You should discuss this with your caregiver.  NORMAL FINDINGS   Normal: less than standard deviation below normal (greater than -1).  Osteopenia: 1 to 2.5 standard deviations below normal (-1 to -2.5).  Osteoporosis: greater than 2.5 standard deviations below normal (less than -2.5). Test results are reported as a "T score" and a "Z score."The T score is a number that compares your bone density with the bone density of healthy, young women.The Z score is a number that compares your bone density with the scores of women who are the same age, gender, and race.  Ranges for normal findings may vary among different laboratories and hospitals. You should always check  with your doctor after having lab work or other tests done to discuss the meaning of your test results and whether your values are considered within normal limits. MEANING OF TEST  Your caregiver will go over the test results with you and discuss the importance and meaning of your results, as well as treatment options and the need for additional tests if necessary. OBTAINING THE TEST RESULTS It is your responsibility to obtain your test results. Ask the lab or department performing the test when and how you will get your results. Document  Released: 02/24/2004 Document Revised: 04/26/2011 Document Reviewed: 03/18/2010 [facility] Patient Information 2015 [place], [place]. This information is not intended to replace advice given to you by your health care provider. Make sure you discuss any questions you have with your health care provider.  Fall Prevention and Home Safety Falls cause injuries and can affect all age groups. It is possible to prevent falls.  HOW TO PREVENT FALLS  Wear shoes with rubber soles that do not have an opening for your toes.  Keep the inside and outside of your house well lit.  Use night lights throughout your home.  Remove clutter from floors.  Clean up floor spills.  Remove throw rugs or fasten them to the floor with carpet tape.  Do not place electrical cords across pathways.  Put grab bars by your tub, shower, and toilet. Do not use towel bars as grab bars.  Put handrails on both sides of the stairway. Fix loose handrails.  Do not climb on stools or stepladders, if possible.  Do not wax your floors.  Repair uneven or unsafe sidewalks, walkways, or stairs.  Keep items you use a lot within reach.  Be aware of pets.  Keep emergency numbers next to the telephone.  Put smoke detectors in your home and near bedrooms. Ask your doctor what other things you can do to prevent falls. Document Released: 11/28/2008 Document Revised: 08/03/2011 Document Reviewed: 05/04/2011 [facility] Patient Information 2015 [place], [place]. This information is not intended to replace advice given to you by your health care provider. Make sure you discuss any questions you have with your health care provider.  Health Maintenance Adopting a healthy lifestyle and getting preventive care can go a long way to promote health and wellness. Talk with your health care provider about what schedule of regular examinations is right for you. This is a good chance for you to check in with your provider about disease prevention  and staying healthy. In between checkups, there are plenty of things you can do on your own. Experts have done a lot of research about which lifestyle changes and preventive measures are most likely to keep you healthy. Ask your health care provider for more information. WEIGHT AND DIET  Eat a healthy diet  Be sure to include plenty of vegetables, fruits, low-fat dairy products, and lean protein.  Do not eat a lot of foods high in solid fats, added sugars, or salt.  Get regular exercise. This is one of the most important things you can do for your health.  Most adults should exercise for at least 150 minutes each week. The exercise should increase your heart rate and make you sweat (moderate-intensity exercise).  Most adults should also do strengthening exercises at least twice a week. This is in addition to the moderate-intensity exercise.  Maintain a healthy weight  Body mass index (BMI) is a measurement that can be used to identify possible weight problems. It estimates body fat  based on height and weight. Your health care provider can help determine your BMI and help you achieve or maintain a healthy weight.  For females 3 years of age and older:   A BMI below 18.5 is considered underweight.  A BMI of 18.5 to 24.9 is normal.  A BMI of 25 to 29.9 is considered overweight.  A BMI of 30 and above is considered obese.  Watch levels of cholesterol and blood lipids  You should start having your blood tested for lipids and cholesterol at 74 years of age, then have this test every 5 years.  You may need to have your cholesterol levels checked more often if:  Your lipid or cholesterol levels are high.  You are older than 74 years of age.  You are at high risk for heart disease.  CANCER SCREENING   Lung Cancer  Lung cancer screening is recommended for adults 48-62 years old who are at high risk for lung cancer because of a history of smoking.  A yearly low-dose CT scan of  the lungs is recommended for people who:  Currently smoke.  Have quit within the past 15 years.  Have at least a 30-pack-year history of smoking. A pack year is smoking an average of one pack of cigarettes a day for 1 year.  Yearly screening should continue until it has been 15 years since you quit.  Yearly screening should stop if you develop a health problem that would prevent you from having lung cancer treatment.  Breast Cancer  Practice breast self-awareness. This means understanding how your breasts normally appear and feel.  It also means doing regular breast self-exams. Let your health care provider know about any changes, no matter how small.  If you are in your 20s or 30s, you should have a clinical breast exam (CBE) by a health care provider every 1-3 years as part of a regular health exam.  If you are 25 or older, have a CBE every year. Also consider having a breast X-ray (mammogram) every year.  If you have a family history of breast cancer, talk to your health care provider about genetic screening.  If you are at high risk for breast cancer, talk to your health care provider about having an MRI and a mammogram every year.  Breast cancer gene (BRCA) assessment is recommended for women who have family members with BRCA-related cancers. BRCA-related cancers include:  Breast.  Ovarian.  Tubal.  Peritoneal cancers.  Results of the assessment will determine the need for genetic counseling and BRCA1 and BRCA2 testing. Cervical Cancer Routine pelvic examinations to screen for cervical cancer are no longer recommended for nonpregnant women who are considered low risk for cancer of the pelvic organs (ovaries, uterus, and vagina) and who do not have symptoms. A pelvic examination may be necessary if you have symptoms including those associated with pelvic infections. Ask your health care provider if a screening pelvic exam is right for you.   The Pap test is the screening  test for cervical cancer for women who are considered at risk.  If you had a hysterectomy for a problem that was not cancer or a condition that could lead to cancer, then you no longer need Pap tests.  If you are older than 65 years, and you have had normal Pap tests for the past 10 years, you no longer need to have Pap tests.  If you have had past treatment for cervical cancer or a condition that could lead  to cancer, you need Pap tests and screening for cancer for at least 20 years after your treatment.  If you no longer get a Pap test, assess your risk factors if they change (such as having a new sexual partner). This can affect whether you should start being screened again.  Some women have medical problems that increase their chance of getting cervical cancer. If this is the case for you, your health care provider may recommend more frequent screening and Pap tests.  The human papillomavirus (HPV) test is another test that may be used for cervical cancer screening. The HPV test looks for the virus that can cause cell changes in the cervix. The cells collected during the Pap test can be tested for HPV.  The HPV test can be used to screen women 46 years of age and older. Getting tested for HPV can extend the interval between normal Pap tests from three to five years.  An HPV test also should be used to screen women of any age who have unclear Pap test results.  After 74 years of age, women should have HPV testing as often as Pap tests.  Colorectal Cancer  This type of cancer can be detected and often prevented.  Routine colorectal cancer screening usually begins at 74 years of age and continues through 74 years of age.  Your health care provider may recommend screening at an earlier age if you have risk factors for colon cancer.  Your health care provider may also recommend using home test kits to check for hidden blood in the stool.  A small camera at the end of a tube can be used  to examine your colon directly (sigmoidoscopy or colonoscopy). This is done to check for the earliest forms of colorectal cancer.  Routine screening usually begins at age 24.  Direct examination of the colon should be repeated every 5-10 years through 74 years of age. However, you may need to be screened more often if early forms of precancerous polyps or small growths are found. Skin Cancer  Check your skin from head to toe regularly.  Tell your health care provider about any new moles or changes in moles, especially if there is a change in a mole's shape or color.  Also tell your health care provider if you have a mole that is larger than the size of a pencil eraser.  Always use sunscreen. Apply sunscreen liberally and repeatedly throughout the day.  Protect yourself by wearing long sleeves, pants, a wide-brimmed hat, and sunglasses whenever you are outside. HEART DISEASE, DIABETES, AND HIGH BLOOD PRESSURE   Have your blood pressure checked at least every 1-2 years. High blood pressure causes heart disease and increases the risk of stroke.  If you are between 40 years and 31 years old, ask your health care provider if you should take aspirin to prevent strokes.  Have regular diabetes screenings. This involves taking a blood sample to check your fasting blood sugar level.  If you are at a normal weight and have a low risk for diabetes, have this test once every three years after 74 years of age.  If you are overweight and have a high risk for diabetes, consider being tested at a younger age or more often. PREVENTING INFECTION  Hepatitis B  If you have a higher risk for hepatitis B, you should be screened for this virus. You are considered at high risk for hepatitis B if:  You were born in a country where hepatitis  B is common. Ask your health care provider which countries are considered high risk.  Your parents were born in a high-risk country, and you have not been immunized  against hepatitis B (hepatitis B vaccine).  You have HIV or AIDS.  You use needles to inject street drugs.  You live with someone who has hepatitis B.  You have had sex with someone who has hepatitis B.  You get hemodialysis treatment.  You take certain medicines for conditions, including cancer, organ transplantation, and autoimmune conditions. Hepatitis C  Blood testing is recommended for:  Everyone born from [birth year] through 1965.  Anyone with known risk factors for hepatitis C. Sexually transmitted infections (STIs)  You should be screened for sexually transmitted infections (STIs) including gonorrhea and chlamydia if:  You are sexually active and are younger than 74 years of age.  You are older than 74 years of age and your health care provider tells you that you are at risk for this type of infection.  Your sexual activity has changed since you were last screened and you are at an increased risk for chlamydia or gonorrhea. Ask your health care provider if you are at risk.  If you do not have HIV, but are at risk, it may be recommended that you take a prescription medicine daily to prevent HIV infection. This is called pre-exposure prophylaxis (PrEP). You are considered at risk if:  You are sexually active and do not regularly use condoms or know the HIV status of your partner(s).  You take drugs by injection.  You are sexually active with a partner who has HIV. Talk with your health care provider about whether you are at high risk of being infected with HIV. If you choose to begin PrEP, you should first be tested for HIV. You should then be tested every 3 months for as long as you are taking PrEP.  PREGNANCY   If you are premenopausal and you may become pregnant, ask your health care provider about preconception counseling.  If you may become pregnant, take 400 to 800 micrograms (mcg) of folic acid every day.  If you want to prevent pregnancy, talk to your health care  provider about birth control (contraception). OSTEOPOROSIS AND MENOPAUSE   Osteoporosis is a disease in which the bones lose minerals and strength with aging. This can result in serious bone fractures. Your risk for osteoporosis can be identified using a bone density scan.  If you are 72 years of age or older, or if you are at risk for osteoporosis and fractures, ask your health care provider if you should be screened.  Ask your health care provider whether you should take a calcium or vitamin D supplement to lower your risk for osteoporosis.  Menopause may have certain physical symptoms and risks.  Hormone replacement therapy may reduce some of these symptoms and risks. Talk to your health care provider about whether hormone replacement therapy is right for you.  HOME CARE INSTRUCTIONS   Schedule regular health, dental, and eye exams.  Stay current with your immunizations.   Do not use any tobacco products including cigarettes, chewing tobacco, or electronic cigarettes.  If you are pregnant, do not drink alcohol.  If you are breastfeeding, limit how much and how often you drink alcohol.  Limit alcohol intake to no more than 1 drink per day for nonpregnant women. One drink equals 12 ounces of beer, 5 ounces of wine, or 1 ounces of hard liquor.  Do not use street  drugs.  Do not share needles.  Ask your health care provider for help if you need support or information about quitting drugs.  Tell your health care provider if you often feel depressed.  Tell your health care provider if you have ever been abused or do not feel safe at home. Document Released: 08/17/2010 Document Revised: 06/18/2013 Document Reviewed: 01/03/2013 [facility] Patient Information 2015 [place], [place]. This information is not intended to replace advice given to you by your health care provider. Make sure you discuss any questions you have with your health care provider.

## 2014-06-06 NOTE — Progress Notes (Signed)
Followup bilateral shoulder pain  HPI: Patient is a 74 year old female with significant comorbidities and multiple medical problems coming in with followup of right shoulder pain. 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 Patient did have a corticosteroid injection in both shoulders 3 months ago and states that in her left shoulder continues to do well but continues to have pain now in the right shoulder. Still unable to lift the shoulder significantly. Patient states that it is also starting to wake her up at night..  Patient is also complaining of bilateral knee pain. Patient has been seen previously for this. 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 Patient was given steroid injections 2 months ago and states that overall she is doing relatively well but the pain is starting to come back slowly.  Patient has many other medical problems including carcinoma of the lung and congestive heart failure. Patient has also had cervical cord compression with myelopathy.   Past medical, surgical, family and social history reviewed. Medications reviewed all in the electronic medical record.   Review of Systems: No headache, visual changes, nausea, vomiting, diarrhea, constipation, dizziness, abdominal pain, skin rash, fevers, chills, night sweats, weight loss, swollen lymph nodes, body aches, joint swelling, muscle aches, chest pain, shortness of breath, mood changes.   Objective:    Blood pressure 108/70, pulse 64, height '5\' 1"'$  (1.549 m), weight 175 lb (79.379 kg), SpO2 88 %.   General: No apparent distress alert and oriented x3 mood and affect normal, dressed appropriately. Patient does have some increased kyphosis HEENT: Pupils equal, extraocular movements  intact Respiratory: Patient's speak in full sentences and does not appear short of breath Cardiovascular: No lower extremity edema, non tender, no erythema Skin: Warm dry intact with no signs of infection or rash on extremities or on axial skeleton. Abdomen: Soft nontender Neuro: Cranial nerves II through XII are intact, neurovascularly intact in all extremities with 2+ DTRs and 2+ pulses. Lymph: No lymphadenopathy of posterior or anterior cervical chain or axillae bilaterally.  Gait patient does walk with a cane but does have good balance  MSK: Non tender with full range of motion and good stability and symmetric strength and tone of, elbows, wrist, hip, knee and ankles bilaterally.  Shoulder: Right shoulder Inspection reveals no abnormalities, atrophy or asymmetry. Palpation is normal with no tenderness over AC joint or bicipital groove. Patient is only able to do active forward flexion to 90, abduction to 80, internal rotation to hip. Contralateral side has full range of motion but does have significant crepitus The patient's rotator cuff strength is 4/5 with positive drop arm sign. Patient has 5 out of 5 strength of the contralateral side Positive empty can severe crepitus on range of motion Speeds and Yergason's tests normal. No labral pathology noted with negative Obrien's, negative clunk and good stability. Normal scapular function observed. No specific change from previous exam. Knee: Bilaterally Mild valgus deformity bilaterally Mildly less tender to palpation mostly of the medial joint line. ROM full in flexion and extension and lower leg rotation. Ligaments with solid consistent endpoints including ACL, PCL, LCL, MCL. Negative Mcmurray's, Apley's, and Thessalonian tests.  painful patellar compression. Patellar glide with moderate crepitus. Patellar and quadriceps tendons unremarkable. Hamstring and quadriceps strength is normal.   Procedure note  After informed written and  verbal consent, patient was seated on exam table. Right shoulder was prepped with alcohol swab and utilizing posterior approach, patient's right glenohumeral space was injected with 4:1  marcaine 0.5%: Kenalog '40mg'$ /dL. Patient tolerated the procedure well without immediate complications.   Impression and Recommendations:     This case required medical decision making of moderate complexity.

## 2014-06-06 NOTE — Progress Notes (Signed)
Subjective:   [name] is a 74 y.o. female who presents for Medicare Annual (Subsequent) preventive examination.  Review of Systems:  Reviewed problem list Health better, the same or worse than last year? States hernia bothers her s/p appendectomy; has seen the surgeon and will fup with Dr. Jenny Reichmann.  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 Tamala Julian;   Vision: Check this year completed; new glasses; screens completed  Dental:Dentures  New doctors none  Current Care Team reviewed and updated; nephrologist at [facility] but not sure of doctor; Sees oncologist;nephrologist; CV doctor  Cardiac Risk Factors include: advanced age (>67mn, >>60women);dyslipidemia;hypertension;microalbuminuria;obesity (BMI >30kg/m2)  Educated on BMI and cooks vegetables and meats at home; Eats fruit; watches what she eats so she will not gain weight. Exercise difficult due to OA      Objective:     Vitals: BP 108/70 mmHg  Pulse 64  Ht '5\' 1"'$  (1.549 m)  Wt 175 lb (79.379 kg)  BMI 33.08 kg/m2  Tobacco History  Smoking status  . Former Smoker -- 1.50 packs/day for 40 years  . Types: Cigarettes  . Quit date: 02/15/2006  Smokeless tobacco  . Former UEngineer, structuralgiven: Not Answered  (had lung cancer; Stopped in 2008) Past Medical History  Diagnosis Date  . HYPERLIPIDEMIA 11/18/2006  . HYPERKALEMIA 02/05/2008  . ANEMIA-IRON DEFICIENCY 09/22/2008  . Anemia of other chronic disease 02/05/2008  . ANXIETY 06/03/2008  . DEPRESSION 04/28/2009  . HYPERTENSION 11/18/2006  . PERICARDITIS 01/03/2007  . AORTIC STENOSIS/ INSUFFICIENCY, NON-RHEUMATIC 12/03/2008  . SINUSITIS- ACUTE-NOS 11/20/2007  . SINUSITIS, CHRONIC 02/05/2008  . ALLERGIC RHINITIS 11/18/2006  . PNEUMONIA 05/02/2007  . C O P D 06/27/2008  . Stricture and stenosis of esophagus 11/14/2008  . GERD 01/03/2007  . BARRETTS ESOPHAGUS 11/14/2008  . DYSPEPSIA 03/23/2007  . PRURITUS 10/08/2008  .  OSTEOARTHRITIS 11/18/2006  . OSTEOARTHRITIS, KNEE, LEFT 01/03/2007  . Cervicalgia 01/13/2009  . BACK PAIN 02/04/2009    is improved  . BURSITIS, LEFT HIP 04/18/2009  . OSTEOPOROSIS 11/18/2006  . INSOMNIA-SLEEP DISORDER-UNSPEC 06/03/2008  . FATIGUE 04/18/2009  . PERIPHERAL EDEMA 02/13/2009  . MURMUR 11/18/2006  . DYSPHAGIA UNSPECIFIED 03/23/2007  . Abdominal pain, generalized 03/01/2007  . Nonspecific (abnormal) findings on radiological and other examination of body structure 06/03/2008  . Tuberculin Test Reaction 11/18/2006  . Acute bronchitis 02/23/2010  . CHOLELITHIASIS 04/24/2010  . NEPHROLITHIASIS, HX OF 04/24/2010  . Polyarthralgia 06/09/2010  . Elevated sed rate 06/11/2010  . Positive ANA (antinuclear antibody) 06/11/2010  . Rheumatoid factor positive 06/11/2010  . Cancer     lung ca  . 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 SPONDYLOSIS, CERVICAL, WITH RADICULOPATHY 01/13/2009  . Cervical radiculopathy 09/28/2010  . Lumbar radiculopathy 09/28/2010  . RENAL INSUFFICIENCY 02/05/2008    function has improved.  . Transfusion history 09-01-12    4'14 tranfused x 2 units  . Bleeding nose 09-01-12    past hx. none recent after having surgery  . Wound healing, delayed 09-01-12    right abdominal area   Past Surgical History  Procedure Laterality Date  . Abdominal hysterectomy    . Tubal ligation    . Tumor removal  08/06/08    from lungs  . Anterior cervical decomp/discectomy fusion  01/15/2011    Procedure: ANTERIOR CERVICAL DECOMPRESSION/DISCECTOMY FUSION 2 LEVELS;  Surgeon: HCooper RenderPool;  [facility];  Service: Neurosurgery;  Laterality:  N/A;  Cervical Three-Four, Cervical Four-Five Anterior Cervical Decompression Fusion   . Colonscopy    . Nose surgery    . Laparoscopic appendectomy N/A 05/25/2012    Procedure: APPENDECTOMY LAPAROSCOPIC  CONVERTED TO  OPEN APPENDECTOMY, exploratory laparatomy;  Surgeon: Odis Hollingshead, MD;  Location: [facility];  Service: General;  Laterality: N/A;    . Bowel resection N/A 05/25/2012    Procedure: SMALL BOWEL RESECTION;  Surgeon: Odis Hollingshead, MD;  Location: [facility];  Service: General;  Laterality: N/A;  . Laparoscopic lysis of adhesions N/A 05/25/2012    Procedure: LAPAROSCOPIC LYSIS OF ADHESIONS;  Surgeon: Odis Hollingshead, MD;  Location: [facility];  Service: General;  Laterality: N/A;  . Appendectomy    . Cataract extraction, bilateral  09-01-12  . Debridement of abdominal wall abscess N/A 09/07/2012    Procedure:  ABDOMINAL WOUND IRRIGATION AND TWO LAYERED CLOSURE;  Surgeon: Odis Hollingshead, MD;  Location: [facility];  Service: General;  Laterality: N/A;   Family History  Problem Relation Age of Onset  . Hyperlipidemia Other   . Hypertension Other   . Coronary artery disease Other     several nephrew  . Heart disease Father   . Colon cancer Neg Hx   . Esophageal cancer Neg Hx   . Rectal cancer Neg Hx   . Stomach cancer Neg Hx   . Cancer Brother     ?stomach   History  Sexual Activity  . Sexual Activity: Yes  . Birth Control/ Protection: Post-menopausal    Outpatient Encounter Prescriptions as of 06/06/2014  Medication Sig  . alendronate (FOSAMAX) 70 MG tablet Take 1 tablet (70 mg total) by mouth every 7 (seven) days. Take with a full glass of water on an empty stomach. Patient takes on Mondays.  Marland Kitchen amLODipine (NORVASC) 5 MG tablet TAKE 1 TABLET (5 MG TOTAL) BY MOUTH DAILY.  . citalopram (CELEXA) 10 MG tablet Take 1 tablet (10 mg total) by mouth every morning.  . furosemide (LASIX) 40 MG tablet TAKE 1 TABLET TWICE DAILY  . metoprolol succinate (TOPROL-XL) 25 MG 24 hr tablet TAKE 1 TABLET (25 MG TOTAL) BY MOUTH EVERY MORNING.  . pantoprazole (PROTONIX) 40 MG tablet Take 40 mg by mouth daily.   . pravastatin (PRAVACHOL) 40 MG tablet Take 1 tablet (40 mg total) by mouth daily.  . tamsulosin (FLOMAX) 0.4 MG CAPS capsule TAKE ONE CAPSULE BY MOUTH DAILY  . traMADol (ULTRAM) 50 MG tablet TAKE 1 TABLET BY MOUTH EVERY 8 HOURS AS  NEEDED  . albuterol (PROVENTIL HFA;VENTOLIN HFA) 108 (90 BASE) MCG/ACT inhaler Inhale 1-2 puffs into the lungs 4 (four) times daily as needed for wheezing or shortness of breath. For wheezing or shortness of breath.  . ALPRAZolam (XANAX) 0.5 MG tablet Take 0.5 mg by mouth daily as needed for anxiety.  Marland Kitchen zolpidem (AMBIEN) 5 MG tablet Take 1 tablet (5 mg total) by mouth at bedtime as needed. For sleep.    Activities of Daily Living In your present state of health, do you have any difficulty performing the following activities: 06/06/2014  Hearing? N  Vision? N  Difficulty concentrating or making decisions? N  Walking or climbing stairs? Y  Dressing or bathing? N  Doing errands, shopping? N  Preparing Food and eating ? N  Using the Toilet? N  In the past six months, have you accidently leaked urine? N  Do you have problems with loss of bowel control? N  Managing your Medications? N  Managing your Finances? N  Housekeeping or managing your Housekeeping? N    Patient Care Team: Biagio Borg, MD as PCP - General Burnell Blanks, MD as Consulting Physician (Cardiology)    Assessment:    CC risk: obesity (BMI); family hx; diabetes; HTN; hyperlipidemia; Sedentary; Reviewed weight and plans to maintain and eat well.  smoking hx; yes; s/p lung cancer and treated;   CC risk osteo: Dexa scan overdue; but undergoing treatment (fosomax) and is compliant.  Will discuss need for dexa scan with Dr. Jenny Reichmann in July  Risk for hepatitis or high risk social behavior: reviewed  Risk for Depression: neg  Risk for Falls:Patient aware and careful; no falls to date  Safety assessed (driving issues; vision; home; environment; support) does not drive; safety reviewed; lives independently   Cognition assessed by AD8; competed with a score of 0; no issues cognitive issues identified at this time Manages independent living well with support for transportation/ dtr and son  Current Exercise ; stays  busy in home; "does not just sit around"  Current dietary  Eats fruits and vegetables; meals    Immunizations up to date  Except for shingles;   Health Maintenance up to date and a Preventive Wellness Plan was given to the patient   Exercise Activities and Dietary recommendations Current Exercise Habits:: Home exercise routine, Time (Minutes): 60 (general house work; does "Not Sit around"), Frequency (Times/Week): 6, Weekly Exercise (Minutes/Week): 360, Intensity: Mild  Goals    . Exercise 3x per week (30 min per time)     Hernia s/p appendectomy that  keeps her from exrecises; and rotator cuff as well as generalized arthritis;  but does do house work and is very mobile in home Will investigate patio garden; educated on silver sneakers      Fall Risk Fall Risk  06/06/2014 06/06/2014 05/22/2013  Falls in the past year? No No No  Risk for fall due to : Impaired mobility Impaired mobility -  Risk for fall due to (comments): - AT risk and no falls -   Depression Screen PHQ 2/9 Scores 06/06/2014 05/22/2013  PHQ - 2 Score 0 0     Cognitive Testing MMSE - Mini Mental State Exam 06/06/2014  Not completed: Refused    Immunization History  Administered Date(s) Administered  . H1N1 03/05/2008  . Influenza Split 11/06/2010  . Influenza Whole 11/16/2006, 12/18/2007, 10/23/2009  . Influenza,inj,Quad PF,36+ Mos 11/21/2012, 11/06/2013  . Pneumococcal Conjugate-13 11/21/2012  . Pneumococcal Polysaccharide-23 04/16/2006  . Td 03/05/2008, 04/24/2010   Screening Tests Health Maintenance  Topic Date Due  . ZOSTAVAX  06/27/2000  . DEXA SCAN  06/27/2005  . INFLUENZA VACCINE  09/16/2014  . MAMMOGRAM  12/05/2015  . TETANUS/TDAP  04/23/2020  . COLONOSCOPY  04/27/2021  . PNA vac Low Risk Adult  Completed      Plan:  Plan   The patient agrees to: 1. fup with Dr. Jenny Reichmann in July in regard to need for Dexa scan; currently taking med as ordered 2. Will attempt to continue to walk and stay mobile  in home via housekeeping and may start a patio garden for fun 3. Son  will check on insurance regarding shingles due to cost.  Advanced directive: completed POA and will bring this back to the office in July to be scanned   During the course of the visit the patient was educated and counseled about the following appropriate screening and preventive services:   Vaccines to include Pneumoccal, Influenza,  Hepatitis B, Td, Zostavax, / update to date except for shingles but will call insurance to determine cost.  Electrocardiogram: deferred  Cardiovascular Disease: reviewed and stable at present; no c/o of sob; chest pain or other issues  Colorectal cancer screening; not due  Bone density screening to discuss with dr. Jenny Reichmann  Diabetes screening; n/a  Glaucoma screening : completed  Mammography/PAP Completed  Nutrition counseling  Ongoing; no referrals recommended at this time.  Patient Instructions (the written plan) was given to the patient.   FEXMD,YJWLK, RN  06/06/2014    Reviewed and agree. Cathlean Cower MD

## 2014-06-11 ENCOUNTER — Telehealth: Payer: Self-pay | Admitting: Internal Medicine

## 2014-06-11 ENCOUNTER — Ambulatory Visit (HOSPITAL_BASED_OUTPATIENT_CLINIC_OR_DEPARTMENT_OTHER): Payer: Medicare HMO | Admitting: Internal Medicine

## 2014-06-11 ENCOUNTER — Encounter: Payer: Self-pay | Admitting: Internal Medicine

## 2014-06-11 VITALS — BP 116/46 | HR 66 | Temp 98.2°F | Resp 18 | Ht 61.0 in | Wt 173.1 lb

## 2014-06-11 DIAGNOSIS — J984 Other disorders of lung: Secondary | ICD-10-CM | POA: Diagnosis not present

## 2014-06-11 DIAGNOSIS — Z85118 Personal history of other malignant neoplasm of bronchus and lung: Secondary | ICD-10-CM | POA: Diagnosis not present

## 2014-06-11 DIAGNOSIS — C3492 Malignant neoplasm of unspecified part of left bronchus or lung: Secondary | ICD-10-CM

## 2014-06-11 NOTE — Telephone Encounter (Signed)
Pt confirmed labs/ov per 04/26 POF, gave pt AVS and Calendar...  KJ

## 2014-06-11 NOTE — Progress Notes (Signed)
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  PRIOR THERAPY:Left upper lobectomy with node dissection and bronchoplasty and intercostal muscle flap under the care of Dr. Arlyce Dice on 08/06/2008.  CURRENT THERAPY: Observation.  INTERVAL HISTORY: [name] 74 y.o. female returns to the clinic today for annual followup visit accompanied by her daughter. The patient has no complaints today. She denied having any significant chest pain, shortness of breath, cough or hemoptysis. She denied having any significant weight loss or night sweats. The patient has repeat CT scan of the chest performed recently and she is here today for evaluation and discussion of her scan results.  MEDICAL HISTORY: Past Medical History  Diagnosis Date  . HYPERLIPIDEMIA 11/18/2006  . HYPERKALEMIA 02/05/2008  . ANEMIA-IRON DEFICIENCY 09/22/2008  . Anemia of other chronic disease 02/05/2008  . ANXIETY 06/03/2008  . DEPRESSION 04/28/2009  . HYPERTENSION 11/18/2006  . PERICARDITIS 01/03/2007  . AORTIC STENOSIS/ INSUFFICIENCY, NON-RHEUMATIC 12/03/2008  . SINUSITIS- ACUTE-NOS 11/20/2007  . SINUSITIS, CHRONIC 02/05/2008  . ALLERGIC RHINITIS 11/18/2006  . PNEUMONIA 05/02/2007  . C O P D 06/27/2008  . Stricture and stenosis of esophagus 11/14/2008  . GERD 01/03/2007  . BARRETTS ESOPHAGUS 11/14/2008  . DYSPEPSIA 03/23/2007  . PRURITUS 10/08/2008  . OSTEOARTHRITIS 11/18/2006  . OSTEOARTHRITIS, KNEE, LEFT 01/03/2007  . Cervicalgia 01/13/2009  . BACK PAIN 02/04/2009    is improved  . BURSITIS, LEFT HIP 04/18/2009  . OSTEOPOROSIS 11/18/2006  . INSOMNIA-SLEEP DISORDER-UNSPEC 06/03/2008  . FATIGUE 04/18/2009  . PERIPHERAL EDEMA 02/13/2009  . MURMUR 11/18/2006  .  DYSPHAGIA UNSPECIFIED 03/23/2007  . Abdominal pain, generalized 03/01/2007  . Nonspecific (abnormal) findings on radiological and other examination of body structure 06/03/2008  . Tuberculin Test Reaction 11/18/2006  . Acute bronchitis 02/23/2010  . CHOLELITHIASIS 04/24/2010  . NEPHROLITHIASIS, HX OF 04/24/2010  . Polyarthralgia 06/09/2010  . Elevated sed rate 06/11/2010  . Positive ANA (antinuclear antibody) 06/11/2010  . Rheumatoid factor positive 06/11/2010  . Cancer     lung ca  . 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 SPONDYLOSIS, CERVICAL, WITH RADICULOPATHY 01/13/2009  . Cervical radiculopathy 09/28/2010  . Lumbar radiculopathy 09/28/2010  . RENAL INSUFFICIENCY 02/05/2008    function has improved.  . Transfusion history 09-01-12    4'14 tranfused x 2 units  . Bleeding nose 09-01-12    past hx. none recent after having surgery  . Wound healing, delayed 09-01-12    right abdominal area    ALLERGIES:  is allergic to amoxicillin; fentanyl; hydrocodone; codeine; and penicillins.  MEDICATIONS:  Current Outpatient Prescriptions  Medication Sig Dispense Refill  . alendronate (FOSAMAX) 70 MG tablet Take 1 tablet (70 mg total) by mouth every 7 (seven) days. Take with a full glass of water on an empty stomach. Patient takes on Mondays. 4 tablet 11  . ALPRAZolam (XANAX) 0.5 MG tablet Take 0.5 mg by mouth daily as needed for anxiety.    Marland Kitchen amLODipine (NORVASC) 5 MG tablet TAKE 1 TABLET (5 MG TOTAL) BY MOUTH DAILY. 90 tablet 3  . citalopram (CELEXA) 10 MG tablet Take 1 tablet (10 mg total) by mouth every morning. 90 tablet 2  . furosemide (LASIX) 40 MG tablet TAKE 1 TABLET TWICE DAILY 180 tablet 3  .  loratadine (CLARITIN) 10 MG tablet Take 10 mg by mouth daily as needed for allergies.    . metoprolol succinate (TOPROL-XL) 25 MG 24 hr tablet TAKE 1 TABLET (25 MG TOTAL) BY MOUTH EVERY MORNING. 90 tablet 3  . pantoprazole (PROTONIX) 40 MG tablet Take 40 mg by mouth daily.     . pravastatin (PRAVACHOL)  40 MG tablet Take 1 tablet (40 mg total) by mouth daily. 90 tablet 3  . tamsulosin (FLOMAX) 0.4 MG CAPS capsule TAKE ONE CAPSULE BY MOUTH DAILY 30 capsule 5  . traMADol (ULTRAM) 50 MG tablet TAKE 1 TABLET BY MOUTH EVERY 8 HOURS AS NEEDED 60 tablet 2  . albuterol (PROVENTIL HFA;VENTOLIN HFA) 108 (90 BASE) MCG/ACT inhaler Inhale 1-2 puffs into the lungs 4 (four) times daily as needed for wheezing or shortness of breath. For wheezing or shortness of breath.     No current facility-administered medications for this visit.    SURGICAL HISTORY:  Past Surgical History  Procedure Laterality Date  . Abdominal hysterectomy    . Tubal ligation    . Tumor removal  08/06/08    from lungs  . Anterior cervical decomp/discectomy fusion  01/15/2011    Procedure: ANTERIOR CERVICAL DECOMPRESSION/DISCECTOMY FUSION 2 LEVELS;  Surgeon: Cooper Render Pool;  Location: [facility];  Service: Neurosurgery;  Laterality: N/A;  Cervical Three-Four, Cervical Four-Five Anterior Cervical Decompression Fusion   . Colonscopy    . Nose surgery    . Laparoscopic appendectomy N/A 05/25/2012    Procedure: APPENDECTOMY LAPAROSCOPIC  CONVERTED TO  OPEN APPENDECTOMY, exploratory laparatomy;  Surgeon: Odis Hollingshead, MD;  Location: [facility];  Service: General;  Laterality: N/A;  . Bowel resection N/A 05/25/2012    Procedure: SMALL BOWEL RESECTION;  Surgeon: Odis Hollingshead, MD;  Location: [facility];  Service: General;  Laterality: N/A;  . Laparoscopic lysis of adhesions N/A 05/25/2012    Procedure: LAPAROSCOPIC LYSIS OF ADHESIONS;  Surgeon: Odis Hollingshead, MD;  Location: [facility];  Service: General;  Laterality: N/A;  . Appendectomy    . Cataract extraction, bilateral  09-01-12  . Debridement of abdominal wall abscess N/A 09/07/2012    Procedure:  ABDOMINAL WOUND IRRIGATION AND TWO LAYERED CLOSURE;  Surgeon: Odis Hollingshead, MD;  Location: [facility];  Service: General;  Laterality: N/A;    REVIEW OF SYSTEMS:  A comprehensive review of  systems was negative.   PHYSICAL EXAMINATION: General appearance: alert, cooperative and no distress Head: Normocephalic, without obvious abnormality, atraumatic Neck: no adenopathy Lymph nodes: Cervical, supraclavicular, and axillary nodes normal. Resp: clear to auscultation bilaterally Back: symmetric, no curvature. ROM normal. No CVA tenderness. Cardio: regular rate and rhythm, S1, S2 normal, no murmur, click, rub or gallop GI: soft, non-tender; bowel sounds normal; no masses,  no organomegaly Extremities: extremities normal, atraumatic, no cyanosis or edema Neurologic: Alert and oriented X 3, normal strength and tone. Normal symmetric reflexes. Normal coordination and gait  ECOG PERFORMANCE STATUS: 0 - Asymptomatic  Blood pressure 116/46, pulse 66, temperature 98.2 F (36.8 C), temperature source Oral, resp. rate 18, height '5\' 1"'$  (1.549 m), weight 173 lb 1.6 oz (78.518 kg), SpO2 92 %.  LABORATORY DATA: Lab Results  Component Value Date   WBC 5.7 06/04/2014   HGB 11.6 06/04/2014   HCT 35.7 06/04/2014   MCV 97.3 06/04/2014   PLT 205 06/04/2014      Chemistry      Component Value Date/Time   NA 143 06/04/2014 1026   NA 140  08/03/2013 0912   NA 147* 09/20/2011 0817   K 4.5 06/04/2014 1026   K 4.0 08/03/2013 0912   K 4.2 09/20/2011 0817   CL 102 08/03/2013 0912   CL 100 09/20/2011 0817   CO2 23 06/04/2014 1026   CO2 28 08/03/2013 0912   CO2 29 09/20/2011 0817   BUN 43.5* 06/04/2014 1026   BUN 26* 08/03/2013 0912   BUN 20 09/20/2011 0817   CREATININE 2.2* 06/04/2014 1026   CREATININE 2.3* 08/03/2013 0912   CREATININE 1.6* 09/20/2011 0817      Component Value Date/Time   CALCIUM 9.1 06/04/2014 1026   CALCIUM 9.2 08/03/2013 0912   CALCIUM 9.1 09/20/2011 0817   ALKPHOS 56 06/04/2014 1026   ALKPHOS 46 08/03/2013 0912   ALKPHOS 69 09/20/2011 0817   AST 11 06/04/2014 1026   AST 15 08/03/2013 0912   AST 17 09/20/2011 0817   ALT 15 06/04/2014 1026   ALT 15 08/03/2013  0912   ALT 23 09/20/2011 0817   BILITOT 0.25 06/04/2014 1026   BILITOT 0.5 08/03/2013 0912   BILITOT 0.60 09/20/2011 0817       RADIOGRAPHIC STUDIES: Ct Chest Wo Contrast  06/04/2014   CLINICAL DATA:  Restaging lung cancer, diagnosed 2010  EXAM: CT CHEST WITHOUT CONTRAST  TECHNIQUE: Multidetector CT imaging of the chest was performed following the standard protocol without IV contrast.  COMPARISON:  06/04/2013  FINDINGS: Mediastinum/Nodes: Thyroid appears normal. Heart size mildly enlarged. Moderate atheromatous aortic and coronary arterial calcification.  Assessment for lymphadenopathy is suboptimal due to lack of IV contrast. Allowing for this, no mediastinal or hilar lymphadenopathy is identified. Patchy soft tissue density within the anterior mediastinum is stable, image 17.  Lungs/Pleura: Evidence of left upper lobectomy. No mass lesion adjacent to the resection margin. Mild emphysematous change is noted. Right upper lobe subpleural reticular markings without measurable mass, image 19, are stable. Subjective interval increase in bibasilar reticular opacities with a few areas of borderline right lower lobe air bronchogram formation and developing alveolar/ground-glass opacity. No new measurable mass or nodule. Central airways are patent.  Upper abdomen: Gallstones partly visualized without other evidence for acute cholecystitis. Nonobstructing 3 mm left upper renal pole calculus image 54. Left renal cortical 1.8 cm cyst image 58.  Musculoskeletal: Bones are subjectively osteopenic. No acute osseous abnormality. Left upper hemithoracic thoracotomy defect. Right AC joint degenerative change partly noted. No lytic or sclerotic osseous lesion is identified.  IMPRESSION: Progression of bibasilar, right greater left, interstitial opacities with suggestion of interval development of alveolar/ground-glass airspace opacity and air bronchogram formation. Findings are nonspecific and may represent post treatment  pneumonitis or interstitial lung disease. Given chronicity of these findings, acute infection is much less likely. The appearance is less typical for metastatic spread of disease. Further evaluation at high-resolution chest CT without contrast is recommended for further evaluation.   Electronically Signed   By: Conchita Paris M.D.   On: 06/04/2014 13:09   ASSESSMENT AND PLAN: This is a very pleasant 74 years old [place] American female with history of stage IA non-small cell lung cancer, squamous cell carcinoma, status post left upper lobectomy with lymph node dissection.  She has been observation for the last 6 years. The patient is doing fine with no specific complaints. The recent CT scan of the chest showed progression of by basilar opacities concerning for inflammatory process but cannot completely rule out malignancy. I discussed the scan results with the patient and her daughter. I recommended for her  continuous observation with repeat CT scan of the chest without contrast in 6 months.  The patient was advised to call me immediately if she has any concerning symptoms in the interval.  All questions were answered. The patient knows to call the clinic with any problems, questions or concerns. We can certainly see the patient much sooner if necessary.  Disclaimer: This note was dictated with voice recognition software. Similar sounding words can inadvertently be transcribed and may not be corrected upon review.

## 2014-06-29 ENCOUNTER — Other Ambulatory Visit: Payer: Self-pay | Admitting: Internal Medicine

## 2014-07-01 ENCOUNTER — Ambulatory Visit (INDEPENDENT_AMBULATORY_CARE_PROVIDER_SITE_OTHER): Payer: Medicare HMO | Admitting: Family Medicine

## 2014-07-01 ENCOUNTER — Encounter: Payer: Self-pay | Admitting: Family Medicine

## 2014-07-01 VITALS — BP 106/70 | HR 69 | Ht 61.0 in | Wt 177.0 lb

## 2014-07-01 DIAGNOSIS — M1710 Unilateral primary osteoarthritis, unspecified knee: Secondary | ICD-10-CM

## 2014-07-01 DIAGNOSIS — M179 Osteoarthritis of knee, unspecified: Secondary | ICD-10-CM | POA: Diagnosis not present

## 2014-07-01 DIAGNOSIS — IMO0002 Reserved for concepts with insufficient information to code with codable children: Secondary | ICD-10-CM

## 2014-07-01 NOTE — Progress Notes (Signed)
Pre visit review using our clinic review tool, if applicable. No additional management support is needed unless otherwise documented below in the visit note. 

## 2014-07-01 NOTE — Progress Notes (Signed)
  Followup bilateral shoulder pain  HPI: Patient is a 74 year old female with significant comorbidities and multiple medical problems coming in with followup of right shoulder pain. 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 Patient did have a corticosteroid injection in both shoulders one month ago patient is doing better still.  Patient is also complaining of bilateral knee pain. Patient is actually very happy even though it has been 3 months since her last steroid injection. Able to do all activities of daily living. Denies any numbness or Ting[place]. Denies any giving out on her. Still ambulates with the aid of a cane.  Past medical history: Patient has many other medical problems including carcinoma of the lung and congestive heart failure. Patient has also had cervical cord compression with myelopathy.   Past medical, surgical, family and social history reviewed. Medications reviewed all in the electronic medical record.   Review of Systems: No headache, visual changes, nausea, vomiting, diarrhea, constipation, dizziness, abdominal pain, skin rash, fevers, chills, night sweats, weight loss, swollen lymph nodes, body aches, joint swelling, muscle aches, chest pain, shortness of breath, mood changes.   Objective:    Blood pressure 106/70, pulse 69, height '5\' 1"'$  (1.549 m), weight 177 lb (80.287 kg), SpO2 92 %.   General: No apparent distress alert and oriented x3 mood and affect normal, dressed appropriately. Patient does have some increased kyphosis HEENT: Pupils equal, extraocular movements intact Respiratory: Patient's speak in full sentences and does not appear short of breath Cardiovascular: No lower extremity edema, non tender, no erythema Skin: Warm dry intact with no signs of infection or rash on extremities or on axial skeleton. Abdomen: Soft nontender Neuro: Cranial nerves II through XII are intact, neurovascularly intact in  all extremities with 2+ DTRs and 2+ pulses. Lymph: No lymphadenopathy of posterior or anterior cervical chain or axillae bilaterally.  Gait patient does walk with a cane but does have good balance  MSK: Non tender with full range of motion and good stability and symmetric strength and tone of, elbows, wrist, hip, knee and ankles bilaterally.  Shoulder: Bilateral Inspection reveals no abnormalities, atrophy or asymmetry. Palpation is normal with no tenderness over AC joint or bicipital groove. Patient is only able to do active forward flexion to 90, abduction to 80, internal rotation to hip. Contralateral side has full range of motion but does have crepitus The patient's rotator cuff strength is 4/5 with positive drop arm sign. Patient has 5 out of 5 strength of the contralateral side Positive empty can severe crepitus on range of motion Speeds and Yergason's tests normal. No labral pathology noted with negative Obrien's, negative clunk and good stability. Normal scapular function observed. No specific change from previous exam. Knee: Bilaterally Mild valgus deformity bilaterally Tender to palpation mostly of the medial joint line. ROM full in flexion and extension and lower leg rotation. Ligaments with solid consistent endpoints including ACL, PCL, LCL, MCL. Negative Mcmurray's, Apley's, and Thessalonian tests.  painful patellar compression. Patellar glide with moderate crepitus. Patellar and quadriceps tendons unremarkable. Hamstring and quadriceps strength is normal.      Impression and Recommendations:     This case required medical decision making of moderate complexity.

## 2014-07-01 NOTE — Assessment & Plan Note (Signed)
Patient is doing much better. Discussed icing and continuing the conservative therapy. Patient and will come back again in 6-8 weeks for another follow up appointment.

## 2014-07-01 NOTE — Patient Instructions (Signed)
You are doing everything right Ice is your friend Continue the pennsaid For the foot consider lacing shoes differently Spenco orthotics over the counter Make an appointment in 6 weeks.

## 2014-07-19 ENCOUNTER — Other Ambulatory Visit: Payer: Self-pay | Admitting: Internal Medicine

## 2014-07-29 ENCOUNTER — Ambulatory Visit (INDEPENDENT_AMBULATORY_CARE_PROVIDER_SITE_OTHER): Payer: Medicare HMO | Admitting: Internal Medicine

## 2014-07-29 ENCOUNTER — Encounter: Payer: Self-pay | Admitting: Internal Medicine

## 2014-07-29 VITALS — BP 138/78 | HR 70 | Temp 98.2°F | Resp 18 | Wt 179.0 lb

## 2014-07-29 DIAGNOSIS — J324 Chronic pansinusitis: Secondary | ICD-10-CM

## 2014-07-29 DIAGNOSIS — J310 Chronic rhinitis: Secondary | ICD-10-CM | POA: Diagnosis not present

## 2014-07-29 DIAGNOSIS — J209 Acute bronchitis, unspecified: Secondary | ICD-10-CM | POA: Diagnosis not present

## 2014-07-29 MED ORDER — PREDNISONE 10 MG PO TABS: ORAL_TABLET | ORAL | Status: DC

## 2014-07-29 MED ORDER — SULFAMETHOXAZOLE-TRIMETHOPRIM 800-160 MG PO TABS
1.0000 | ORAL_TABLET | Freq: Two times a day (BID) | ORAL | Status: DC
Start: 2014-07-29 — End: 2014-09-12

## 2014-07-29 NOTE — Progress Notes (Signed)
   Subjective:    Patient ID: [name], female    DOB: 11/21/1940, 74 y.o.   MRN: [id number]  HPI Her symptoms began 07/26/14 as nasal congestion and facial puffiness. The latter is often a sign that she has sinus infection according to her. The nasal drainage has been clear. Last night she began to have a cough with thick yellow sputum.  She's had some sneezing without other extrinsic symptoms. She has pressure discomfort in the frontal and maxillary sinus areas. She has also had some hoarseness.  She has been using Mucinex D, Tylenol, Claritin and forcing fluids. Because of PMH of nosebleeds she's not using nasal sprays.  Review of Systems  She denies sore throat; she has no maxillary pain. There is no otic or ophthalmologic discharge. She denies itchy, watery eyes. She has no fever, chills, or rash. There's been no associated chest pain, palpitations, or shortness of breath with cough.      Objective:   Physical Exam General appearance:Adequately nourished; no acute distress or increased work of breathing is present.    Lymphatic: No  lymphadenopathy about the head, neck, or axilla .  Eyes: Faint arcus.There is no scleral icterus.Puffy lower lids ; no conjunctivitis  Ears:  External ear exam shows no significant lesions or deformities.  Otoscopic examination reveals wax bilaterally.Visualized tympanic membranes are intact bilaterally without bulging, retraction, inflammation or discharge.  Nose:  External nasal examination shows no deformity or inflammation. Nasal mucosa very erythematous; R > L; without lesions or exudates No septal dislocation or deviation.No obstruction to airflow.   Oral exam: Dental hygiene is good; lips and gums are healthy appearing.There is no oropharyngeal erythema or exudate .  Neck:  No deformities, thyromegaly, masses, or tenderness noted.      Heart:  Normal rate and regular rhythm. S1 and S2 normal without gallop,  click, rub or other extra  sounds. Grade 1/6 systolic murmur  Lungs:Chest clear to auscultation; no wheezes, rhonchi,rales ,or rubs present.  Extremities:  No cyanosis, edema, or clubbing  noted    Skin: Warm & dry w/o tenting.No significant lesions or rash.  Neuro: Decreased strength & tone. Walking with cane.         Assessment & Plan:  #1 acute bronchitis w/o bronchospasm #2 rhinitis #3 PMH of recurrent sinusitis Plan: See orders and recommendations

## 2014-07-29 NOTE — Patient Instructions (Addendum)

## 2014-07-29 NOTE — Progress Notes (Signed)
Pre visit review using our clinic review tool, if applicable. No additional management support is needed unless otherwise documented below in the visit note. 

## 2014-08-12 ENCOUNTER — Ambulatory Visit: Payer: Medicare HMO | Admitting: Family Medicine

## 2014-08-13 ENCOUNTER — Ambulatory Visit (INDEPENDENT_AMBULATORY_CARE_PROVIDER_SITE_OTHER): Payer: Medicare HMO | Admitting: Internal Medicine

## 2014-08-13 ENCOUNTER — Encounter: Payer: Self-pay | Admitting: Internal Medicine

## 2014-08-13 ENCOUNTER — Other Ambulatory Visit (INDEPENDENT_AMBULATORY_CARE_PROVIDER_SITE_OTHER): Payer: Medicare HMO

## 2014-08-13 VITALS — BP 128/60 | HR 68 | Temp 98.1°F | Ht 62.0 in | Wt 172.0 lb

## 2014-08-13 DIAGNOSIS — Z0000 Encounter for general adult medical examination without abnormal findings: Secondary | ICD-10-CM

## 2014-08-13 DIAGNOSIS — E785 Hyperlipidemia, unspecified: Secondary | ICD-10-CM

## 2014-08-13 DIAGNOSIS — I10 Essential (primary) hypertension: Secondary | ICD-10-CM

## 2014-08-13 DIAGNOSIS — M5416 Radiculopathy, lumbar region: Secondary | ICD-10-CM | POA: Diagnosis not present

## 2014-08-13 LAB — LIPID PANEL
Cholesterol: 166 mg/dL (ref 0–200)
HDL: 38.8 mg/dL — AB (ref >39.00)
LDL CALC: 97 mg/dL (ref 0–99)
NONHDL: 127.2
TRIGLYCERIDES: 149 mg/dL (ref 0.0–149.0)
Total CHOL/HDL Ratio: 4
VLDL: 29.8 mg/dL (ref 0.0–40.0)

## 2014-08-13 LAB — URINALYSIS, ROUTINE W REFLEX MICROSCOPIC
BILIRUBIN URINE: NEGATIVE
HGB URINE DIPSTICK: NEGATIVE
KETONES UR: NEGATIVE
Leukocytes, UA: NEGATIVE
NITRITE: NEGATIVE
RBC / HPF: NONE SEEN (ref 0–?)
Specific Gravity, Urine: <=1.005 — AB (ref 1.000–1.030)
Total Protein, Urine: NEGATIVE
URINE GLUCOSE: NEGATIVE
Urobilinogen, UA: 0.2 (ref 0.0–1.0)
pH: 5 (ref 5.0–8.0)

## 2014-08-13 LAB — CBC WITH DIFFERENTIAL/PLATELET
Basophils Absolute: 0 10*3/uL (ref 0.0–0.1)
Basophils Relative: 0.4 % (ref 0.0–3.0)
Eosinophils Absolute: 0.1 10*3/uL (ref 0.0–0.7)
Eosinophils Relative: 1.4 % (ref 0.0–5.0)
HCT: 36.7 % (ref 36.0–46.0)
Hemoglobin: 12 g/dL (ref 12.0–15.0)
Lymphocytes Relative: 17.6 % (ref 12.0–46.0)
Lymphs Abs: 1.1 10*3/uL (ref 0.7–4.0)
MCHC: 32.8 g/dL (ref 30.0–36.0)
MCV: 99.5 fl (ref 78.0–100.0)
MONO ABS: 0.6 10*3/uL (ref 0.1–1.0)
Monocytes Relative: 10.1 % (ref 3.0–12.0)
NEUTROS PCT: 70.5 % (ref 43.0–77.0)
Neutro Abs: 4.4 10*3/uL (ref 1.4–7.7)
PLATELETS: 205 10*3/uL (ref 150.0–400.0)
RBC: 3.69 Mil/uL — AB (ref 3.87–5.11)
RDW: 14.7 % (ref 11.5–15.5)
WBC: 6.2 10*3/uL (ref 4.0–10.5)

## 2014-08-13 LAB — TSH: TSH: 3.24 u[IU]/mL (ref 0.35–4.50)

## 2014-08-13 LAB — HEPATIC FUNCTION PANEL
ALT: 11 U/L (ref 0–35)
AST: 10 U/L (ref 0–37)
Albumin: 3.7 g/dL (ref 3.5–5.2)
Alkaline Phosphatase: 45 U/L (ref 39–117)
BILIRUBIN DIRECT: 0 mg/dL (ref 0.0–0.3)
Total Bilirubin: 0.4 mg/dL (ref 0.2–1.2)
Total Protein: 7.3 g/dL (ref 6.0–8.3)

## 2014-08-13 LAB — BASIC METABOLIC PANEL
BUN: 51 mg/dL — ABNORMAL HIGH (ref 6–23)
CALCIUM: 9.1 mg/dL (ref 8.4–10.5)
CO2: 25 meq/L (ref 19–32)
Chloride: 104 mEq/L (ref 96–112)
Creatinine, Ser: 2.48 mg/dL — ABNORMAL HIGH (ref 0.40–1.20)
GFR: 24.43 mL/min — AB (ref >60.00)
Glucose, Bld: 89 mg/dL (ref 70–99)
Potassium: 4.6 mEq/L (ref 3.5–5.1)
SODIUM: 138 meq/L (ref 135–145)

## 2014-08-13 MED ORDER — TRAMADOL HCL 50 MG PO TABS: 50.0000 mg | ORAL_TABLET | Freq: Three times a day (TID) | ORAL | Status: DC | PRN

## 2014-08-13 NOTE — Progress Notes (Signed)
Pre visit review using our clinic review tool, if applicable. No additional management support is needed unless otherwise documented below in the visit note. 

## 2014-08-13 NOTE — Progress Notes (Signed)
Subjective:    Patient ID: [name], female    DOB: 08/14/1940, 74 y.o.   MRN: [id number]  HPI  Here for wellness and f/u;  Overall doing ok;  Pt denies Chest pain, worsening SOB, DOE, wheezing, orthopnea, PND, worsening LE edema, palpitations, dizziness or syncope.  Pt denies neurological change such as new headache, facial or extremity weakness.  Pt denies polydipsia, polyuria, or low sugar symptoms. Pt states overall good compliance with treatment and medications, good tolerability, and has been trying to follow appropriate diet.  Pt denies worsening depressive symptoms, suicidal ideation or panic. No fever, night sweats, wt loss, loss of appetite, or other constitutional symptoms.  Pt states good ability with ADL's, has low fall risk, home safety reviewed and adequate, no other significant changes in hearing or vision, and only occasionally active with exercise. Bilat shoulder pain remains improved, has seen sports med. Due for f/u oncology oct 2016 with CT/labs.  Pt continues to have recurring LBP without change in severity, bowel or bladder change, fever, wt loss,  worsening LE pain/numbness/weakness, gait change or falls.  Also has some left costal margin pain as well, worse to palpate, asks for tramadol refill as tylenol not working, cannot take nsaid due to CKD Past Medical History  Diagnosis Date  . HYPERLIPIDEMIA 11/18/2006  . HYPERKALEMIA 02/05/2008  . ANEMIA-IRON DEFICIENCY 09/22/2008  . Anemia of other chronic disease 02/05/2008  . ANXIETY 06/03/2008  . DEPRESSION 04/28/2009  . HYPERTENSION 11/18/2006  . PERICARDITIS 01/03/2007  . AORTIC STENOSIS/ INSUFFICIENCY, NON-RHEUMATIC 12/03/2008  . SINUSITIS- ACUTE-NOS 11/20/2007  . SINUSITIS, CHRONIC 02/05/2008  . ALLERGIC RHINITIS 11/18/2006  . PNEUMONIA 05/02/2007  . C O P D 06/27/2008  . Stricture and stenosis of esophagus 11/14/2008  . GERD 01/03/2007  . BARRETTS ESOPHAGUS 11/14/2008  . DYSPEPSIA 03/23/2007  . PRURITUS 10/08/2008  .  OSTEOARTHRITIS 11/18/2006  . OSTEOARTHRITIS, KNEE, LEFT 01/03/2007  . Cervicalgia 01/13/2009  . BACK PAIN 02/04/2009    is improved  . BURSITIS, LEFT HIP 04/18/2009  . OSTEOPOROSIS 11/18/2006  . INSOMNIA-SLEEP DISORDER-UNSPEC 06/03/2008  . FATIGUE 04/18/2009  . PERIPHERAL EDEMA 02/13/2009  . MURMUR 11/18/2006  . DYSPHAGIA UNSPECIFIED 03/23/2007  . Abdominal pain, generalized 03/01/2007  . Nonspecific (abnormal) findings on radiological and other examination of body structure 06/03/2008  . Tuberculin Test Reaction 11/18/2006  . Acute bronchitis 02/23/2010  . CHOLELITHIASIS 04/24/2010  . NEPHROLITHIASIS, HX OF 04/24/2010  . Polyarthralgia 06/09/2010  . Elevated sed rate 06/11/2010  . Positive ANA (antinuclear antibody) 06/11/2010  . Rheumatoid factor positive 06/11/2010  . Cancer     lung ca  . 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 SPONDYLOSIS, CERVICAL, WITH RADICULOPATHY 01/13/2009  . Cervical radiculopathy 09/28/2010  . Lumbar radiculopathy 09/28/2010  . RENAL INSUFFICIENCY 02/05/2008    function has improved.  . Transfusion history 09-01-12    4'14 tranfused x 2 units  . Bleeding nose 09-01-12    past hx. none recent after having surgery  . Wound healing, delayed 09-01-12    right abdominal area   Past Surgical History  Procedure Laterality Date  . Abdominal hysterectomy    . Tubal ligation    . Tumor removal  08/06/08    from lungs  . Anterior cervical decomp/discectomy fusion  01/15/2011    Procedure: ANTERIOR CERVICAL DECOMPRESSION/DISCECTOMY FUSION 2 LEVELS;  Surgeon: Cooper Render Pool;  Location: [facility];  Service: Neurosurgery;  Laterality: N/A;  Cervical Three-Four, Cervical Four-Five Anterior Cervical Decompression Fusion   .  Colonscopy    . Nose surgery    . Laparoscopic appendectomy N/A 05/25/2012    Procedure: APPENDECTOMY LAPAROSCOPIC  CONVERTED TO  OPEN APPENDECTOMY, exploratory laparatomy;  Surgeon: Odis Hollingshead, MD;  Location: [facility];  Service: General;  Laterality: N/A;    . Bowel resection N/A 05/25/2012    Procedure: SMALL BOWEL RESECTION;  Surgeon: Odis Hollingshead, MD;  Location: [facility];  Service: General;  Laterality: N/A;  . Laparoscopic lysis of adhesions N/A 05/25/2012    Procedure: LAPAROSCOPIC LYSIS OF ADHESIONS;  Surgeon: Odis Hollingshead, MD;  Location: [facility];  Service: General;  Laterality: N/A;  . Appendectomy    . Cataract extraction, bilateral  09-01-12  . Debridement of abdominal wall abscess N/A 09/07/2012    Procedure:  ABDOMINAL WOUND IRRIGATION AND TWO LAYERED CLOSURE;  Surgeon: Odis Hollingshead, MD;  Location: [facility];  Service: General;  Laterality: N/A;    reports that she quit smoking about 8 years ago. Her smoking use included Cigarettes. She has a 60 pack-year smoking history. She has quit using smokeless tobacco. She reports that she does not drink alcohol or use illicit drugs. family history includes Cancer in her brother; Coronary artery disease in her other; Heart disease in her father; Hyperlipidemia in her other; Hypertension in her other. There is no history of Colon cancer, Esophageal cancer, Rectal cancer, or Stomach cancer. Allergies  Allergen Reactions  . Amoxicillin Itching  . Fentanyl Other (See Comments)    hallucinations  . Hydrocodone Other (See Comments)    ineffective  . Codeine Hives and Rash  . Penicillins Hives and Rash   Current Outpatient Prescriptions on File Prior to Visit  Medication Sig Dispense Refill  . albuterol (PROVENTIL HFA;VENTOLIN HFA) 108 (90 BASE) MCG/ACT inhaler Inhale 1-2 puffs into the lungs 4 (four) times daily as needed for wheezing or shortness of breath. For wheezing or shortness of breath.    Marland Kitchen alendronate (FOSAMAX) 70 MG tablet Take 1 tablet (70 mg total) by mouth every 7 (seven) days. Take with a full glass of water on an empty stomach. Patient takes on Mondays. 4 tablet 11  . ALPRAZolam (XANAX) 0.5 MG tablet Take 0.5 mg by mouth daily as needed for anxiety.    Marland Kitchen amLODipine (NORVASC) 5  MG tablet TAKE 1 TABLET (5 MG TOTAL) BY MOUTH DAILY. 90 tablet 3  . citalopram (CELEXA) 10 MG tablet Take 1 tablet (10 mg total) by mouth every morning. 90 tablet 2  . furosemide (LASIX) 40 MG tablet TAKE 1 TABLET TWICE DAILY 180 tablet 3  . loratadine (CLARITIN) 10 MG tablet Take 10 mg by mouth daily as needed for allergies.    . metoprolol succinate (TOPROL-XL) 25 MG 24 hr tablet TAKE 1 TABLET (25 MG TOTAL) BY MOUTH EVERY MORNING. 90 tablet 3  . pantoprazole (PROTONIX) 40 MG tablet Take 40 mg by mouth daily.     . pravastatin (PRAVACHOL) 40 MG tablet Take 1 tablet (40 mg total) by mouth daily. 90 tablet 3  . tamsulosin (FLOMAX) 0.4 MG CAPS capsule TAKE ONE CAPSULE BY MOUTH DAILY 30 capsule 5  . predniSONE (DELTASONE) 10 MG tablet 1 tid pc (Patient not taking: Reported on 08/13/2014) 21 tablet 0  . sulfamethoxazole-trimethoprim (BACTRIM DS,SEPTRA DS) 800-160 MG per tablet Take 1 tablet by mouth 2 (two) times daily. (Patient not taking: Reported on 08/13/2014) 20 tablet 0   No current facility-administered medications on file prior to visit.     Review of  Systems Constitutional: Negative for increased diaphoresis, other activity, appetite or siginficant weight change other than noted HENT: Negative for worsening hearing loss, ear pain, facial swelling, mouth sores and neck stiffness.   Eyes: Negative for other worsening pain, redness or visual disturbance.  Respiratory: Negative for shortness of breath and wheezing  Cardiovascular: Negative for chest pain and palpitations.  Gastrointestinal: Negative for diarrhea, blood in stool, abdominal distention or other pain Genitourinary: Negative for hematuria, flank pain or change in urine volume.  Musculoskeletal: Negative for myalgias or other joint complaints.  Skin: Negative for color change and wound or drainage.  Neurological: Negative for syncope and numbness. other than noted Hematological: Negative for adenopathy. or other  swelling Psychiatric/Behavioral: Negative for hallucinations, SI, self-injury, decreased concentration or other worsening agitation.      Objective:   Physical Exam BP 128/60 mmHg  Pulse 68  Temp(Src) 98.1 F (36.7 C) (Oral)  Ht '5\' 2"'$  (1.575 m)  Wt 172 lb (78.019 kg)  BMI 31.45 kg/m2  SpO2 94% VS noted,  Constitutional: Pt is oriented to person, place, and time. Appears well-developed and well-nourished, in no significant distress Head: Normocephalic and atraumatic.  Right Ear: External ear normal.  Left Ear: External ear normal.  Nose: Nose normal.  Mouth/Throat: Oropharynx is clear and moist.  Eyes: Conjunctivae and EOM are normal. Pupils are equal, round, and reactive to light.  Neck: Normal range of motion. Neck supple. No JVD present. No tracheal deviation present or significant neck LA or mass Cardiovascular: Normal rate, regular rhythm, normal heart sounds and intact distal pulses.   Pulmonary/Chest: Effort normal and breath sounds without rales or wheezing  Abdominal: Soft. Bowel sounds are normal. NT. No HSM , with tender left costal margin, no swelling or rash Musculoskeletal: Normal range of motion. Exhibits no edema. Spine nontender Lymphadenopathy:  Has no cervical adenopathy.  Neurological: Pt is alert and oriented to person, place, and time. Pt has normal reflexes. No cranial nerve deficit. Motor grossly intact Skin: Skin is warm and dry. No rash noted.  Psychiatric:  Has normal mood and affect. Behavior is normal.     Assessment & Plan:

## 2014-08-13 NOTE — Assessment & Plan Note (Signed)
Improved overall but with recurrent pain, no neuro change today, for tramadol prn

## 2014-08-13 NOTE — Assessment & Plan Note (Signed)

## 2014-08-13 NOTE — Patient Instructions (Addendum)
Please continue all other medications as before, and refills have been done if requested - the tramadol for pain  Please have the pharmacy call with any other refills you may need.  Please continue your efforts at being more active, low cholesterol diet, and weight control.  You are otherwise up to date with prevention measures today.  Please keep your appointments with your specialists as you may have planned  Please go to the LAB in the Basement (turn left off the elevator) for the tests to be done today  You will be contacted by phone if any changes need to be made immediately.  Otherwise, you will receive a letter about your results with an explanation, but please check with MyChart first.  Please return in 6 months, or sooner if needed

## 2014-08-14 ENCOUNTER — Other Ambulatory Visit: Payer: Self-pay | Admitting: Internal Medicine

## 2014-08-16 ENCOUNTER — Ambulatory Visit: Payer: Medicare HMO | Admitting: Internal Medicine

## 2014-08-21 ENCOUNTER — Ambulatory Visit: Payer: Medicare HMO | Admitting: Family Medicine

## 2014-08-22 ENCOUNTER — Ambulatory Visit: Payer: Medicare HMO | Admitting: Family Medicine

## 2014-08-26 ENCOUNTER — Other Ambulatory Visit (HOSPITAL_COMMUNITY): Payer: Medicare HMO

## 2014-08-26 ENCOUNTER — Ambulatory Visit (HOSPITAL_COMMUNITY): Payer: Medicare HMO | Attending: Internal Medicine

## 2014-08-26 ENCOUNTER — Other Ambulatory Visit: Payer: Self-pay

## 2014-08-26 DIAGNOSIS — I358 Other nonrheumatic aortic valve disorders: Secondary | ICD-10-CM | POA: Insufficient documentation

## 2014-08-26 DIAGNOSIS — I351 Nonrheumatic aortic (valve) insufficiency: Secondary | ICD-10-CM | POA: Insufficient documentation

## 2014-08-26 DIAGNOSIS — I509 Heart failure, unspecified: Secondary | ICD-10-CM | POA: Diagnosis present

## 2014-08-26 DIAGNOSIS — I517 Cardiomegaly: Secondary | ICD-10-CM | POA: Insufficient documentation

## 2014-08-26 DIAGNOSIS — I340 Nonrheumatic mitral (valve) insufficiency: Secondary | ICD-10-CM | POA: Insufficient documentation

## 2014-08-26 DIAGNOSIS — I5032 Chronic diastolic (congestive) heart failure: Secondary | ICD-10-CM | POA: Diagnosis not present

## 2014-08-26 DIAGNOSIS — I071 Rheumatic tricuspid insufficiency: Secondary | ICD-10-CM | POA: Insufficient documentation

## 2014-08-27 ENCOUNTER — Other Ambulatory Visit: Payer: Self-pay | Admitting: Internal Medicine

## 2014-08-28 ENCOUNTER — Ambulatory Visit: Payer: Medicare HMO | Admitting: Internal Medicine

## 2014-09-03 ENCOUNTER — Ambulatory Visit: Payer: Medicare HMO | Admitting: Internal Medicine

## 2014-09-11 ENCOUNTER — Other Ambulatory Visit (INDEPENDENT_AMBULATORY_CARE_PROVIDER_SITE_OTHER): Payer: Medicare HMO

## 2014-09-11 ENCOUNTER — Encounter: Payer: Self-pay | Admitting: Family Medicine

## 2014-09-11 ENCOUNTER — Ambulatory Visit (INDEPENDENT_AMBULATORY_CARE_PROVIDER_SITE_OTHER): Payer: Medicare HMO | Admitting: Family Medicine

## 2014-09-11 VITALS — BP 124/72 | HR 70 | Ht 62.0 in | Wt 180.0 lb

## 2014-09-11 DIAGNOSIS — R0602 Shortness of breath: Secondary | ICD-10-CM

## 2014-09-11 DIAGNOSIS — M12811 Other specific arthropathies, not elsewhere classified, right shoulder: Secondary | ICD-10-CM | POA: Diagnosis not present

## 2014-09-11 DIAGNOSIS — M12812 Other specific arthropathies, not elsewhere classified, left shoulder: Secondary | ICD-10-CM

## 2014-09-11 DIAGNOSIS — M25511 Pain in right shoulder: Secondary | ICD-10-CM | POA: Diagnosis not present

## 2014-09-11 DIAGNOSIS — M75102 Unspecified rotator cuff tear or rupture of left shoulder, not specified as traumatic: Secondary | ICD-10-CM

## 2014-09-11 DIAGNOSIS — I4891 Unspecified atrial fibrillation: Secondary | ICD-10-CM

## 2014-09-11 DIAGNOSIS — M25512 Pain in left shoulder: Secondary | ICD-10-CM

## 2014-09-11 DIAGNOSIS — M75101 Unspecified rotator cuff tear or rupture of right shoulder, not specified as traumatic: Secondary | ICD-10-CM

## 2014-09-11 DIAGNOSIS — I5032 Chronic diastolic (congestive) heart failure: Secondary | ICD-10-CM | POA: Diagnosis not present

## 2014-09-11 LAB — BRAIN NATRIURETIC PEPTIDE: PRO B NATRI PEPTIDE: 488 pg/mL — AB (ref 0.0–100.0)

## 2014-09-11 NOTE — Patient Instructions (Addendum)
Good to see you  Your heart is out of rhythm.  I am getting you into see your cardiologist Double up the lasix for the next 3 days then back to orignial dose Aspirin '325mg'$  daily Keep trucking along If chest pain or trouble breathing please seek medical attention immediatly.  Continue the vitamins See me again in 3 months or sooner if pain worsens.  Call me if you have questions.

## 2014-09-11 NOTE — Progress Notes (Signed)
Pre visit review using our clinic review tool, if applicable. No additional management support is needed unless otherwise documented below in the visit note. 

## 2014-09-11 NOTE — Assessment & Plan Note (Signed)
Patient was given an injection in both shoulders today. Patient tolerated procedure well. We discussed icing regimen and home exercises. Patient's differential if she does not make improvement could be secondary to the cervical cord compression but I think this is a low likelihood. Patient has many other comorbidities at could also be contributing. Patient will continue icing and some of the home exercises. Patient follow-up in 3-4 weeks for further evaluation and treatment.

## 2014-09-11 NOTE — Progress Notes (Signed)
Followup  right shoulder pain and bilateral knee follow-up  HPI: Patient is a 74 year old female with significant comorbidities and multiple medical problems coming in with followup of right shoulder pain. 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 Patient has not had a injection in the shoulder for greater than 3 months. Patient states both shoulders are giving her pain at this time. Patient states that it is affecting daily activities including getting dress. Patient has been trying to stay active but is having difficulty. Patient states that she has no more weakness but Diff[place] is not as strong as she was 2 years ago. Denies any radiation down the arms or any numbness or tingling. States that the pain is bad enough that it is starting to wake her up at night.  Patient is also complaining of bilateral knee pain. Patient has known osteophytic changes of the knees bilaterally. Patient had been doing very well for quite some time. Patient is on chronic prednisone. Patient states both her legs are giving her more pain. Discuss it as a dull aching pain. Known severe arthritis of the knees. Not as bad as her shoulders are doing.  Patient has many other medical problems including carcinoma of the lung and congestive heart failure. Patient has also had cervical cord compression with myelopathy. Patient's pulse ox today 74%. Patient denies any redness breath but states that she has noticed lower leg swelling  Past medical, surgical, family and social history reviewed. Medications reviewed all in the electronic medical record.   Review of Systems: No headache, visual changes, nausea, vomiting, diarrhea, constipation, dizziness, abdominal pain, skin rash, fevers, chills, night sweats, weight loss, swollen lymph nodes, body aches, joint swelling, muscle aches, chest pain, shortness of breath, mood changes.   Objective:    Blood pressure 124/72, pulse 70,  height '5\' 2"'$  (1.575 m), weight 180 lb (81.647 kg), SpO2 74 %.   General: No apparent distress alert and oriented x3 mood and affect normal, dressed appropriately. Patient does have some increased kyphosis HEENT: Pupils equal, extraocular movements intact Respiratory: Patient's speak in full sentences and does not appear short of breath clear to auscultation with minimal crackles of the basilar lungs Cardiovascular: +2 pitting edema to midcalf bilaterally irregular rate and rhythm with significant 3/5 signal systolic murmur Skin: Warm dry intact with no signs of infection or rash on extremities or on axial skeleton. Abdomen: Soft nontender Neuro: Cranial nerves II through XII are intact, neurovascularly intact in all extremities with 2+ DTRs and 2+ pulses. Lymph: No lymphadenopathy of posterior or anterior cervical chain or axillae bilaterally.  Gait patient does walk with a cane but does have good balance  MSK: Non tender with full range of motion and good stability and symmetric strength and tone of, elbows, wrist, hip, knee and ankles bilaterally.  Shoulder: Bilateral Inspection reveals no abnormalities, atrophy or asymmetry. Palpation is normal with no tenderness over AC joint or bicipital groove. Patient is only able to do active forward flexion to 90, abduction to 80, internal rotation to hip. Contralateral side has full range of motion but does have crepitus The patient's rotator cuff strength is 4/5 with positive drop arm sign. Patient has 5 out of 5 strength of the contralateral side Positive painful arc Positive empty can severe crepitus on range of motion Speeds and Yergason's tests normal. No labral pathology noted with negative Obrien's, negative clunk and good stability. Normal scapular function observed. No specific change from previous  exam.    Procedure: Real-time Ultrasound Guided Injection of right glenohumeral joint Device: GE Logiq E  Ultrasound guided injection is  preferred based studies that show increased duration, increased effect, greater accuracy, decreased procedural pain, increased response rate with ultrasound guided versus blind injection.  Verbal informed consent obtained.  Time-out conducted.  Noted no overlying erythema, induration, or other signs of local infection.  Skin prepped in a sterile fashion.  Local anesthesia: Topical Ethyl chloride.  With sterile technique and under real time ultrasound guidance:  Joint visualized.  23g 1  inch needle inserted posterior approach. Pictures taken for needle placement. Patient did have injection of 2 cc of 1% lidocaine, 2 cc of 0.5% Marcaine, and 1.0 cc of Kenalog 40 mg/dL. Completed without difficulty  Pain immediately resolved suggesting accurate placement of the medication.  Advised to call if fevers/chills, erythema, induration, drainage, or persistent bleeding.  Images permanently stored and available for review in the ultrasound unit.  Impression: Technically successful ultrasound guided injection.  Procedure: Real-time Ultrasound Guided Injection of left glenohumeral joint Device: GE Logiq E  Ultrasound guided injection is preferred based studies that show increased duration, increased effect, greater accuracy, decreased procedural pain, increased response rate with ultrasound guided versus blind injection.  Verbal informed consent obtained.  Time-out conducted.  Noted no overlying erythema, induration, or other signs of local infection.  Skin prepped in a sterile fashion.  Local anesthesia: Topical Ethyl chloride.  With sterile technique and under real time ultrasound guidance:  Joint visualized.  23g 1  inch needle inserted posterior approach. Pictures taken for needle placement. Patient did have injection of 2 cc of 1% lidocaine, 2 cc of 0.5% Marcaine, and 1cc of Kenalog 40 mg/dL. Completed without difficulty  Pain immediately resolved suggesting accurate placement of the medication.   Advised to call if fevers/chills, erythema, induration, drainage, or persistent bleeding.  Images permanently stored and available for review in the ultrasound unit.  Impression: Technically successful ultrasound guided injection.    Impression and Recommendations:     This case required medical decision making of moderate complexity.

## 2014-09-11 NOTE — Assessment & Plan Note (Signed)
Patient does have history of congestive heart failure and is in atrial fibrillation today. We discussed the possibility of getting an EKG but patient would like to wait until she sees her cardiologist. In addition of this we discussed a BNP which patient wanted drawn today. Patient does have lower extremity edema and patient will increase her Lasix and double the amount that she is taking for the next 3 days and then back to her regular dose. Patient is going to see a nurse practitioner on Friday. We discussed the possibility of anticoagulation with the new onset of atrial fibrillation. Patient will do and aspirin but did not want to do a anticoagulation until she saw her cardiologist. Patient knows if any worsening shortness of breath or chest pain that patient should seek medical attention immediately.

## 2014-09-12 ENCOUNTER — Inpatient Hospital Stay (HOSPITAL_COMMUNITY)
Admission: EM | Admit: 2014-09-12 | Discharge: 2014-09-14 | DRG: 291 | Disposition: A | Discharge status: Home / Self Care | Payer: Medicare HMO | Attending: Internal Medicine | Admitting: Internal Medicine

## 2014-09-12 ENCOUNTER — Encounter (HOSPITAL_COMMUNITY): Payer: Self-pay | Admitting: Emergency Medicine

## 2014-09-12 ENCOUNTER — Emergency Department (HOSPITAL_COMMUNITY): Payer: Medicare HMO

## 2014-09-12 DIAGNOSIS — I272 Other secondary pulmonary hypertension: Secondary | ICD-10-CM | POA: Diagnosis present

## 2014-09-12 DIAGNOSIS — I10 Essential (primary) hypertension: Secondary | ICD-10-CM | POA: Diagnosis not present

## 2014-09-12 DIAGNOSIS — Z9842 Cataract extraction status, left eye: Secondary | ICD-10-CM | POA: Diagnosis not present

## 2014-09-12 DIAGNOSIS — D509 Iron deficiency anemia, unspecified: Secondary | ICD-10-CM | POA: Diagnosis present

## 2014-09-12 DIAGNOSIS — G8929 Other chronic pain: Secondary | ICD-10-CM | POA: Diagnosis present

## 2014-09-12 DIAGNOSIS — I270 Primary pulmonary hypertension: Secondary | ICD-10-CM | POA: Diagnosis not present

## 2014-09-12 DIAGNOSIS — J9600 Acute respiratory failure, unspecified whether with hypoxia or hypercapnia: Secondary | ICD-10-CM | POA: Diagnosis present

## 2014-09-12 DIAGNOSIS — Z90710 Acquired absence of both cervix and uterus: Secondary | ICD-10-CM | POA: Diagnosis not present

## 2014-09-12 DIAGNOSIS — M810 Age-related osteoporosis without current pathological fracture: Secondary | ICD-10-CM | POA: Diagnosis present

## 2014-09-12 DIAGNOSIS — R0602 Shortness of breath: Secondary | ICD-10-CM | POA: Diagnosis not present

## 2014-09-12 DIAGNOSIS — Z880 Allergy status to penicillin: Secondary | ICD-10-CM

## 2014-09-12 DIAGNOSIS — G4700 Insomnia, unspecified: Secondary | ICD-10-CM | POA: Diagnosis present

## 2014-09-12 DIAGNOSIS — I5033 Acute on chronic diastolic (congestive) heart failure: Secondary | ICD-10-CM | POA: Diagnosis present

## 2014-09-12 DIAGNOSIS — I350 Nonrheumatic aortic (valve) stenosis: Secondary | ICD-10-CM | POA: Diagnosis present

## 2014-09-12 DIAGNOSIS — Z87891 Personal history of nicotine dependence: Secondary | ICD-10-CM

## 2014-09-12 DIAGNOSIS — Z87442 Personal history of urinary calculi: Secondary | ICD-10-CM | POA: Diagnosis not present

## 2014-09-12 DIAGNOSIS — N183 Chronic kidney disease, stage 3 unspecified: Secondary | ICD-10-CM | POA: Diagnosis present

## 2014-09-12 DIAGNOSIS — I5032 Chronic diastolic (congestive) heart failure: Secondary | ICD-10-CM | POA: Diagnosis present

## 2014-09-12 DIAGNOSIS — J9601 Acute respiratory failure with hypoxia: Secondary | ICD-10-CM

## 2014-09-12 DIAGNOSIS — Z9049 Acquired absence of other specified parts of digestive tract: Secondary | ICD-10-CM | POA: Diagnosis present

## 2014-09-12 DIAGNOSIS — Z9841 Cataract extraction status, right eye: Secondary | ICD-10-CM | POA: Diagnosis not present

## 2014-09-12 DIAGNOSIS — I071 Rheumatic tricuspid insufficiency: Secondary | ICD-10-CM | POA: Diagnosis present

## 2014-09-12 DIAGNOSIS — Z66 Do not resuscitate: Secondary | ICD-10-CM | POA: Diagnosis present

## 2014-09-12 DIAGNOSIS — N179 Acute kidney failure, unspecified: Secondary | ICD-10-CM | POA: Diagnosis present

## 2014-09-12 DIAGNOSIS — Z85118 Personal history of other malignant neoplasm of bronchus and lung: Secondary | ICD-10-CM

## 2014-09-12 DIAGNOSIS — M4722 Other spondylosis with radiculopathy, cervical region: Secondary | ICD-10-CM | POA: Diagnosis present

## 2014-09-12 DIAGNOSIS — D638 Anemia in other chronic diseases classified elsewhere: Secondary | ICD-10-CM | POA: Diagnosis present

## 2014-09-12 DIAGNOSIS — M179 Osteoarthritis of knee, unspecified: Secondary | ICD-10-CM | POA: Diagnosis present

## 2014-09-12 DIAGNOSIS — Z885 Allergy status to narcotic agent status: Secondary | ICD-10-CM | POA: Diagnosis not present

## 2014-09-12 DIAGNOSIS — E785 Hyperlipidemia, unspecified: Secondary | ICD-10-CM | POA: Diagnosis present

## 2014-09-12 DIAGNOSIS — I129 Hypertensive chronic kidney disease with stage 1 through stage 4 chronic kidney disease, or unspecified chronic kidney disease: Secondary | ICD-10-CM | POA: Diagnosis present

## 2014-09-12 DIAGNOSIS — Z881 Allergy status to other antibiotic agents status: Secondary | ICD-10-CM

## 2014-09-12 DIAGNOSIS — J449 Chronic obstructive pulmonary disease, unspecified: Secondary | ICD-10-CM | POA: Diagnosis present

## 2014-09-12 DIAGNOSIS — K2270 Barrett's esophagus without dysplasia: Secondary | ICD-10-CM | POA: Diagnosis present

## 2014-09-12 DIAGNOSIS — I509 Heart failure, unspecified: Secondary | ICD-10-CM

## 2014-09-12 LAB — I-STAT CHEM 8, ED
BUN: 44 mg/dL — ABNORMAL HIGH (ref 6–20)
CALCIUM ION: 1.11 mmol/L — AB (ref 1.13–1.30)
CREATININE: 2.4 mg/dL — AB (ref 0.44–1.00)
Chloride: 98 mmol/L — ABNORMAL LOW (ref 101–111)
Glucose, Bld: 97 mg/dL (ref 65–99)
HCT: 51 % — ABNORMAL HIGH (ref 36.0–46.0)
Hemoglobin: 17.3 g/dL — ABNORMAL HIGH (ref 12.0–15.0)
POTASSIUM: 4.3 mmol/L (ref 3.5–5.1)
SODIUM: 138 mmol/L (ref 135–145)
TCO2: 24 mmol/L (ref 0–100)

## 2014-09-12 LAB — CBC WITH DIFFERENTIAL/PLATELET
Basophils Absolute: 0 10*3/uL (ref 0.0–0.1)
Basophils Relative: 0 % (ref 0–1)
EOS PCT: 0 % (ref 0–5)
Eosinophils Absolute: 0 10*3/uL (ref 0.0–0.7)
HCT: 37.2 % (ref 36.0–46.0)
Hemoglobin: 12.1 g/dL (ref 12.0–15.0)
Lymphocytes Relative: 8 % — ABNORMAL LOW (ref 12–46)
Lymphs Abs: 0.6 10*3/uL — ABNORMAL LOW (ref 0.7–4.0)
MCH: 32.1 pg (ref 26.0–34.0)
MCHC: 32.5 g/dL (ref 30.0–36.0)
MCV: 98.7 fL (ref 78.0–100.0)
MONOS PCT: 10 % (ref 3–12)
Monocytes Absolute: 0.7 10*3/uL (ref 0.1–1.0)
Neutro Abs: 6.2 10*3/uL (ref 1.7–7.7)
Neutrophils Relative %: 82 % — ABNORMAL HIGH (ref 43–77)
PLATELETS: 215 10*3/uL (ref 150–400)
RBC: 3.77 MIL/uL — ABNORMAL LOW (ref 3.87–5.11)
RDW: 14.7 % (ref 11.5–15.5)
WBC: 7.5 10*3/uL (ref 4.0–10.5)

## 2014-09-12 LAB — CARBOXYHEMOGLOBIN
CARBOXYHEMOGLOBIN: 1.3 % (ref 0.5–1.5)
Methemoglobin: 0.9 % (ref 0.0–1.5)
O2 Saturation: 54.2 %
Total hemoglobin: 11.9 g/dL — ABNORMAL LOW (ref 12.0–16.0)

## 2014-09-12 LAB — COMPREHENSIVE METABOLIC PANEL
ALK PHOS: 54 U/L (ref 38–126)
ALT: 22 U/L (ref 14–54)
ANION GAP: 11 (ref 5–15)
AST: 16 U/L (ref 15–41)
Albumin: 3.7 g/dL (ref 3.5–5.0)
BUN: 34 mg/dL — ABNORMAL HIGH (ref 6–20)
CALCIUM: 9.3 mg/dL (ref 8.9–10.3)
CHLORIDE: 98 mmol/L — AB (ref 101–111)
CO2: 27 mmol/L (ref 22–32)
CREATININE: 2.39 mg/dL — AB (ref 0.44–1.00)
GFR, EST AFRICAN AMERICAN: 22 mL/min — AB (ref >60)
GFR, EST NON AFRICAN AMERICAN: 19 mL/min — AB (ref >60)
GLUCOSE: 101 mg/dL — AB (ref 65–99)
POTASSIUM: 4.1 mmol/L (ref 3.5–5.1)
SODIUM: 136 mmol/L (ref 135–145)
Total Bilirubin: 0.6 mg/dL (ref 0.3–1.2)
Total Protein: 6.9 g/dL (ref 6.5–8.1)

## 2014-09-12 LAB — BRAIN NATRIURETIC PEPTIDE: B Natriuretic Peptide: 626.5 pg/mL — ABNORMAL HIGH (ref 0.0–100.0)

## 2014-09-12 LAB — PROCALCITONIN: Procalcitonin: <0.1 ng/mL

## 2014-09-12 LAB — I-STAT TROPONIN, ED: Troponin i, poc: 0.01 ng/mL (ref 0.00–0.08)

## 2014-09-12 LAB — I-STAT CG4 LACTIC ACID, ED: Lactic Acid, Venous: 1.15 mmol/L (ref 0.5–2.0)

## 2014-09-12 MED ORDER — ALBUTEROL SULFATE (2.5 MG/3ML) 0.083% IN NEBU: 2.5000 mg | INHALATION_SOLUTION | Freq: Four times a day (QID) | RESPIRATORY_TRACT | Status: DC | PRN

## 2014-09-12 MED ORDER — SODIUM CHLORIDE 0.9 % IJ SOLN: 3.0000 mL | INTRAMUSCULAR | Status: DC | PRN

## 2014-09-12 MED ORDER — SODIUM CHLORIDE 0.9 % IJ SOLN
3.0000 mL | Freq: Two times a day (BID) | INTRAMUSCULAR | Status: DC
  Administered 2014-09-12 – 2014-09-14 (×4): 3 mL via INTRAVENOUS

## 2014-09-12 MED ORDER — PRAVASTATIN SODIUM 40 MG PO TABS
40.0000 mg | ORAL_TABLET | Freq: Every day | ORAL | Status: DC
  Administered 2014-09-13 – 2014-09-14 (×2): 40 mg via ORAL
  Filled 2014-09-12 (×2): qty 1

## 2014-09-12 MED ORDER — LORATADINE 10 MG PO TABS
10.0000 mg | ORAL_TABLET | Freq: Every day | ORAL | Status: DC | PRN
  Filled 2014-09-12: qty 1

## 2014-09-12 MED ORDER — PANTOPRAZOLE SODIUM 40 MG PO TBEC
40.0000 mg | DELAYED_RELEASE_TABLET | Freq: Every day | ORAL | Status: DC
  Administered 2014-09-13 – 2014-09-14 (×2): 40 mg via ORAL
  Filled 2014-09-12 (×2): qty 1

## 2014-09-12 MED ORDER — SODIUM CHLORIDE 0.9 % IV SOLN: 250.0000 mL | INTRAVENOUS | Status: DC | PRN

## 2014-09-12 MED ORDER — TRAMADOL HCL 50 MG PO TABS: 50.0000 mg | ORAL_TABLET | Freq: Three times a day (TID) | ORAL | Status: DC | PRN

## 2014-09-12 MED ORDER — ALBUTEROL SULFATE HFA 108 (90 BASE) MCG/ACT IN AERS: 1.0000 | INHALATION_SPRAY | Freq: Four times a day (QID) | RESPIRATORY_TRACT | Status: DC | PRN

## 2014-09-12 MED ORDER — ACETAMINOPHEN 325 MG PO TABS
650.0000 mg | ORAL_TABLET | ORAL | Status: DC | PRN
  Administered 2014-09-13: 650 mg via ORAL
  Filled 2014-09-12: qty 2

## 2014-09-12 MED ORDER — METOPROLOL SUCCINATE ER 25 MG PO TB24
25.0000 mg | ORAL_TABLET | Freq: Every day | ORAL | Status: DC
  Administered 2014-09-13 – 2014-09-14 (×2): 25 mg via ORAL
  Filled 2014-09-12 (×2): qty 1

## 2014-09-12 MED ORDER — CITALOPRAM HYDROBROMIDE 10 MG PO TABS
10.0000 mg | ORAL_TABLET | Freq: Every day | ORAL | Status: DC
  Administered 2014-09-13 – 2014-09-14 (×2): 10 mg via ORAL
  Filled 2014-09-12 (×2): qty 1

## 2014-09-12 MED ORDER — POTASSIUM CHLORIDE CRYS ER 20 MEQ PO TBCR
20.0000 meq | EXTENDED_RELEASE_TABLET | Freq: Two times a day (BID) | ORAL | Status: DC
  Administered 2014-09-12 – 2014-09-14 (×4): 20 meq via ORAL
  Filled 2014-09-12 (×7): qty 1

## 2014-09-12 MED ORDER — ONDANSETRON HCL 4 MG/2ML IJ SOLN: 4.0000 mg | Freq: Four times a day (QID) | INTRAMUSCULAR | Status: DC | PRN

## 2014-09-12 MED ORDER — FUROSEMIDE 10 MG/ML IJ SOLN
80.0000 mg | Freq: Two times a day (BID) | INTRAMUSCULAR | Status: AC
Start: 2014-09-12 — End: 2014-09-14
  Administered 2014-09-12 – 2014-09-14 (×4): 80 mg via INTRAVENOUS
  Filled 2014-09-12 (×4): qty 8

## 2014-09-12 MED ORDER — AMLODIPINE BESYLATE 5 MG PO TABS
5.0000 mg | ORAL_TABLET | Freq: Every day | ORAL | Status: DC
  Administered 2014-09-13 – 2014-09-14 (×2): 5 mg via ORAL
  Filled 2014-09-12 (×2): qty 1

## 2014-09-12 MED ORDER — ASPIRIN 325 MG PO TABS
325.0000 mg | ORAL_TABLET | Freq: Every day | ORAL | Status: DC
  Administered 2014-09-13 – 2014-09-14 (×2): 325 mg via ORAL
  Filled 2014-09-12 (×2): qty 1

## 2014-09-12 MED ORDER — ENOXAPARIN SODIUM 30 MG/0.3ML ~~LOC~~ SOLN
30.0000 mg | SUBCUTANEOUS | Status: DC
  Administered 2014-09-12 – 2014-09-13 (×2): 30 mg via SUBCUTANEOUS
  Filled 2014-09-12 (×3): qty 0.3

## 2014-09-12 NOTE — H&P (Signed)
[facility] History and Physical                                                                                    [name], is a 74 y.o. female  MRN: [id number]   DOB - 1940-08-22  Admit Date - 09/12/2014  Outpatient Primary MD for the patient is Cathlean Cower, MD  Referring MD: Loletta Specter  With History of -  Past Medical History  Diagnosis Date  . HYPERLIPIDEMIA 11/18/2006  . HYPERKALEMIA 02/05/2008  . ANEMIA-IRON DEFICIENCY 09/22/2008  . Anemia of other chronic disease 02/05/2008  . ANXIETY 06/03/2008  . DEPRESSION 04/28/2009  . HYPERTENSION 11/18/2006  . PERICARDITIS 01/03/2007  . AORTIC STENOSIS/ INSUFFICIENCY, NON-RHEUMATIC 12/03/2008  . SINUSITIS- ACUTE-NOS 11/20/2007  . SINUSITIS, CHRONIC 02/05/2008  . ALLERGIC RHINITIS 11/18/2006  . PNEUMONIA 05/02/2007  . C O P D 06/27/2008  . Stricture and stenosis of esophagus 11/14/2008  . GERD 01/03/2007  . BARRETTS ESOPHAGUS 11/14/2008  . DYSPEPSIA 03/23/2007  . PRURITUS 10/08/2008  . OSTEOARTHRITIS 11/18/2006  . OSTEOARTHRITIS, KNEE, LEFT 01/03/2007  . Cervicalgia 01/13/2009  . BACK PAIN 02/04/2009    is improved  . BURSITIS, LEFT HIP 04/18/2009  . OSTEOPOROSIS 11/18/2006  . INSOMNIA-SLEEP DISORDER-UNSPEC 06/03/2008  . FATIGUE 04/18/2009  . PERIPHERAL EDEMA 02/13/2009  . MURMUR 11/18/2006  . DYSPHAGIA UNSPECIFIED 03/23/2007  . Abdominal pain, generalized 03/01/2007  . Nonspecific (abnormal) findings on radiological and other examination of body structure 06/03/2008  . Tuberculin Test Reaction 11/18/2006  . Acute bronchitis 02/23/2010  . CHOLELITHIASIS 04/24/2010  . NEPHROLITHIASIS, HX OF 04/24/2010  . Polyarthralgia 06/09/2010  . Elevated sed rate 06/11/2010  . Positive ANA (antinuclear antibody) 06/11/2010  . Rheumatoid factor positive 06/11/2010  . Cancer     lung ca  . 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 SPONDYLOSIS, CERVICAL, WITH RADICULOPATHY 01/13/2009  . Cervical radiculopathy 09/28/2010  . Lumbar radiculopathy 09/28/2010    . RENAL INSUFFICIENCY 02/05/2008    function has improved.  . Transfusion history 09-01-12    4'14 tranfused x 2 units  . Bleeding nose 09-01-12    past hx. none recent after having surgery  . Wound healing, delayed 09-01-12    right abdominal area      Past Surgical History  Procedure Laterality Date  . Abdominal hysterectomy    . Tubal ligation    . Tumor removal  08/06/08    from lungs  . Anterior cervical decomp/discectomy fusion  01/15/2011    Procedure: ANTERIOR CERVICAL DECOMPRESSION/DISCECTOMY FUSION 2 LEVELS;  Surgeon: Cooper Render Pool;  Location: [facility];  Service: Neurosurgery;  Laterality: N/A;  Cervical Three-Four, Cervical Four-Five Anterior Cervical Decompression Fusion   . Colonscopy    . Nose surgery    . Laparoscopic appendectomy N/A 05/25/2012    Procedure: APPENDECTOMY LAPAROSCOPIC  CONVERTED TO  OPEN APPENDECTOMY, exploratory laparatomy;  Surgeon: Odis Hollingshead, MD;  Location: [facility];  Service: General;  Laterality: N/A;  . Bowel resection N/A 05/25/2012    Procedure: SMALL BOWEL RESECTION;  Surgeon: Odis Hollingshead, MD;  Location: [facility];  Service: General;  Laterality: N/A;  . Laparoscopic lysis of adhesions  N/A 05/25/2012    Procedure: LAPAROSCOPIC LYSIS OF ADHESIONS;  Surgeon: Odis Hollingshead, MD;  Location: [facility];  Service: General;  Laterality: N/A;  . Appendectomy    . Cataract extraction, bilateral  09-01-12  . Debridement of abdominal wall abscess N/A 09/07/2012    Procedure:  ABDOMINAL WOUND IRRIGATION AND TWO LAYERED CLOSURE;  Surgeon: Odis Hollingshead, MD;  Location: [facility];  Service: General;  Laterality: N/A;    in for   Chief Complaint  Patient presents with  . Shortness of Breath     HPI This is a 74 year old female patient with underlying mild to moderate COPD, severe pulmonary hypertension with associated moderate to severe tricuspid regurgitation and associated grade 1 chronic diastolic heart failure. She also has a history of  Barrett's esophagitis, chronic iron deficiency anemia, chronic back pain and osteoarthritis. She presented to the ER with the history of progressive dyspnea over 3 days with associated lower extremity edema. She's not had any orthopnea but has noticed dyspnea on exertion as well for 3 days. She's not had any cough or fever or other constitutional symptoms. She notified her cardiologist yesterday who instructed her to double up on her Lasix to 80 mg twice a day for at least 3 days. Patient reports that since doubling up on her Lasix she has noticed increased urinary output and decreased swelling in her legs but no improvement in the shortness of breath. She notified the cardiology service and they instructed her to come to the ER further evaluation. Of note patient went to her orthopedic physician yesterday because of recurrent bilateral shoulder pain and underwent injection therapy.  Patient did have low-grade temperature of 99.2, respirations 16, pulse 66, BP 136/49. Room air saturations 92%. Labs revealed a BUN of 44 and creatinine of 2.4 which is around the patient's baseline 1 chronic kidney disease. BNP was slightly elevated at 626. Troponin was normal at 0.01. She had no leukocytosis but did have a left shift of 82%. Hemoglobin stable at 12. Glucose 97. Portable chest x-ray with pulmonary vascular congestion as well as patchy right lung base atelectasis versus pneumonia.   Review of Systems   In addition to the HPI above,  No Fever-chills, myalgias or other constitutional symptoms No Headache, changes with Vision or hearing, new weakness, tingling, numbness in any extremity, No problems swallowing food or Liquids, indigestion/reflux No Chest pain, Cough, palpitations, orthopnea  No Abdominal pain, N/V; no melena or hematochezia, no dark tarry stools, Bowel movements are regular, No dysuria, hematuria or flank pain No new skin rashes, lesions, masses or bruises, No new joints pains-aches No  recent weight gain or loss No polyuria, polydypsia or polyphagia,  *A full 10 point Review of Systems was done, except as stated above, all other Review of Systems were negative.  Social History History  Substance Use Topics  . Smoking status: Former Smoker -- 1.50 packs/day for 40 years    Types: Cigarettes    Quit date: 02/15/2006  . Smokeless tobacco: Former Systems developer  . Alcohol Use: No    Resides at: Private residence  Lives with: Daughter  Ambulatory status: Without assistive devices   Family History Family History  Problem Relation Age of Onset  . Hyperlipidemia Other   . Hypertension Other   . Coronary artery disease Other     several nephrew  . Heart disease Father   . Colon cancer Neg Hx   . Esophageal cancer Neg Hx   . Rectal cancer Neg Hx   .  Stomach cancer Neg Hx   . Cancer Brother     ?stomach     Prior to Admission medications   Medication Sig Start Date End Date Taking? Authorizing Provider  albuterol (PROVENTIL HFA;VENTOLIN HFA) 108 (90 BASE) MCG/ACT inhaler Inhale 1-2 puffs into the lungs 4 (four) times daily as needed for wheezing or shortness of breath. For wheezing or shortness of breath. 01/11/11  Yes Tammy S Parrett, NP  alendronate (FOSAMAX) 70 MG tablet Take 1 tablet (70 mg total) by mouth every 7 (seven) days. Take with a full glass of water on an empty stomach. Patient takes on Mondays. 03/28/12  Yes Biagio Borg, MD  amLODipine (NORVASC) 5 MG tablet TAKE 1 TABLET (5 MG TOTAL) BY MOUTH DAILY. 10/02/13  Yes Biagio Borg, MD  aspirin 325 MG tablet Take 325 mg by mouth daily.   Yes Historical Provider, MD  citalopram (CELEXA) 10 MG tablet TAKE 1 TABLET (10 MG TOTAL) BY MOUTH EVERY MORNING. 08/14/14  Yes Biagio Borg, MD  furosemide (LASIX) 40 MG tablet TAKE 1 TABLET TWICE DAILY 03/21/14  Yes Biagio Borg, MD  loratadine (CLARITIN) 10 MG tablet Take 10 mg by mouth daily as needed for allergies.   Yes Historical Provider, MD  metoprolol succinate (TOPROL-XL)  25 MG 24 hr tablet TAKE 1 TABLET (25 MG TOTAL) BY MOUTH EVERY MORNING. 03/15/14  Yes Lelon Perla, MD  pantoprazole (PROTONIX) 40 MG tablet TAKE 1 TABLET (40 MG TOTAL) BY MOUTH DAILY. 08/14/14  Yes Biagio Borg, MD  pravastatin (PRAVACHOL) 40 MG tablet Take 1 tablet (40 mg total) by mouth daily. 09/05/13  Yes Biagio Borg, MD  traMADol (ULTRAM) 50 MG tablet Take 1 tablet (50 mg total) by mouth every 8 (eight) hours as needed. 08/13/14  Yes Biagio Borg, MD    Allergies  Allergen Reactions  . Amoxicillin Itching  . Fentanyl Other (See Comments)    hallucinations  . Hydrocodone Other (See Comments)    ineffective  . Codeine Hives and Rash  . Penicillins Hives and Rash    Physical Exam  Vitals  Blood pressure 169/67, pulse 77, temperature 99.2 F (37.3 C), temperature source Oral, resp. rate 16, height '5\' 2"'$  (1.575 m), weight 175 lb (79.379 kg), SpO2 93 %.   General:  In no acute distress, appears healthy and well nourished  Psych:  Normal affect, Denies Suicidal or Homicidal ideations, Awake Alert, Oriented X 3. Speech and thought patterns are clear and appropriate, no apparent short term memory deficits  Neuro:   No focal neurological deficits, CN II through XII intact, Strength 5/5 all 4 extremities, Sensation intact all 4 extremities.  ENT:  Ears and Eyes appear Normal, Conjunctivae clear, PER. Moist oral mucosa without erythema or exudates.  Neck:  Supple, No lymphadenopathy appreciated  Respiratory:  Symmetrical chest wall movement, Good air movement bilaterally though somewhat diminished on the left with right basilar crackles. Room Air  Cardiac:  RRR, No Murmurs, 1+ bilateral LE edema noted, no JVD, No carotid bruits, peripheral pulses palpable at 2+  Abdomen:  Positive bowel sounds, Soft, Non tender, Non distended,  No masses appreciated, no obvious hepatosplenomegaly  Skin:  No Cyanosis, Normal Skin Turgor, No Skin Rash or Bruise.  Extremities: Symmetrical without  obvious trauma or injury,  no effusions.  Data Review  CBC  Recent Labs Lab 09/12/14 1335 09/12/14 1415  WBC 7.5  --   HGB 12.1 17.3*  HCT 37.2 51.0*  PLT  215  --   MCV 98.7  --   MCH 32.1  --   MCHC 32.5  --   RDW 14.7  --   LYMPHSABS 0.6*  --   MONOABS 0.7  --   EOSABS 0.0  --   BASOSABS 0.0  --     Chemistries   Recent Labs Lab 09/12/14 1415  NA 138  K 4.3  CL 98*  GLUCOSE 97  BUN 44*  CREATININE 2.40*    estimated creatinine clearance is 20.1 mL/min (by C-G formula based on Cr of 2.4).  No results for input(s): TSH, T4TOTAL, T3FREE, THYROIDAB in the last 72 hours.  Invalid input(s): FREET3  Coagulation profile No results for input(s): INR, PROTIME in the last 168 hours.  No results for input(s): DDIMER in the last 72 hours.  Cardiac Enzymes No results for input(s): CKMB, TROPONINI, MYOGLOBIN in the last 168 hours.  Invalid input(s): CK  Invalid input(s): POCBNP  Urinalysis    Component Value Date/Time   COLORURINE YELLOW 08/13/2014 1103   APPEARANCEUR CLEAR 08/13/2014 1103   LABSPEC <=1.005* 08/13/2014 1103   PHURINE 5.0 08/13/2014 1103   GLUCOSEU NEGATIVE 08/13/2014 1103   GLUCOSEU NEGATIVE 09/08/2012 1228   HGBUR NEGATIVE 08/13/2014 1103   BILIRUBINUR NEGATIVE 08/13/2014 1103   BILIRUBINUR negative 08/01/2013 1903   KETONESUR NEGATIVE 08/13/2014 1103   PROTEINUR negative 08/01/2013 1903   PROTEINUR NEGATIVE 09/08/2012 1228   UROBILINOGEN 0.2 08/13/2014 1103   UROBILINOGEN negative 08/01/2013 1903   NITRITE NEGATIVE 08/13/2014 1103   NITRITE negative 08/01/2013 1903   LEUKOCYTESUR NEGATIVE 08/13/2014 1103    Imaging results:   Dg Chest 2 View  09/12/2014   CLINICAL DATA:  Shortness of breath for 2 days. Patient had left upper lobe removed due to lung cancer.  EXAM: CHEST  2 VIEW  COMPARISON:  March 15, 2014  FINDINGS: The heart size and mediastinal contours are stable. The heart size is enlarged. There is pulmonary vascular  congestion. Patchy consolidation of right lung base is identified at least in part due to atelectasis but superimposed pneumonia is not excluded. There is minimal left pleural effusion. The visualized skeletal structures are stable.  IMPRESSION: Pulmonary vascular congestion.  Patchy consolidation right lung base at least in part due to atelectasis but superimposed pneumonia is not excluded.   Electronically Signed   By: Abelardo Diesel M.D.   On: 09/12/2014 14:15   [employer]  09/12/2014   Procedure: Real-time Ultrasound Guided Injection of right glenohumeral  joint Device: GE Logiq E  Ultrasound guided injection is preferred based studies that show increased  duration, increased effect, greater accuracy, decreased procedural pain,  increased response rate with ultrasound guided versus blind injection.  Verbal informed consent obtained.  Time-out conducted.  Noted no overlying erythema, induration, or other signs of local  infection.  Skin prepped in a sterile fashion.  Local anesthesia: Topical Ethyl chloride.  With sterile technique and under real time ultrasound guidance: Joint  visualized. 23g 1  inch needle inserted posterior approach. Pictures  taken for needle placement. Patient did have injection of 2 cc of 1%  lidocaine, 2 cc of 0.5% Marcaine, and 1.0 cc of Kenalog 40 mg/dL. Completed without difficulty  Pain immediately resolved suggesting accurate placement of the medication.   Advised to call if fevers/chills, erythema, induration, drainage, or  persistent bleeding.  Images permanently stored and available for review in the ultrasound unit.   Impression: Technically successful ultrasound guided injection.  Procedure: Real-time Ultrasound Guided Injection of left glenohumeral  joint Device: GE Logiq E  Ultrasound guided injection is preferred based studies that show increased  duration, increased effect, greater accuracy, decreased procedural pain,  increased response rate  with ultrasound guided versus blind injection.  Verbal informed consent obtained.  Time-out conducted.  Noted no overlying erythema, induration, or other signs of local  infection.  Skin prepped in a sterile fashion.  Local anesthesia: Topical Ethyl chloride.  With sterile technique and under real time ultrasound guidance: Joint  visualized. 23g 1  inch needle inserted posterior approach. Pictures  taken for needle placement. Patient did have injection of 2 cc of 1%  lidocaine, 2 cc of 0.5% Marcaine, and 1cc of Kenalog 40 mg/dL. Completed without difficulty  Pain immediately resolved suggesting accurate placement of the medication.   Advised to call if fevers/chills, erythema, induration, drainage, or  persistent bleeding.  Images permanently stored and available for review in the ultrasound unit.   Impression: Technically successful ultrasound guided injection.    EKG: (Independently reviewed) sinus rhythm with PAC, normal QTC, no ischemic changes   Assessment & Plan  Principal Problem:   Acute respiratory failure: A) Acute on chronic diastolic CHF (congestive heart failure), NYHA class 1 B) Pulmonary hypertension, moderate to severe C) Severe tricuspid regurgitation -Admit to telemetry -Not hypoxic at rest but will check room-air ambulatory sats -Suspect primarily diastolic heart failure although given low-grade fevers and focal changes may have early bronchitis/pneumonia which may have actually precipitated the diastolic heart failure exacerbation -Check lactic acid and Procalcitonin to better qualify -No empiric antibiotics since only low-grade fever; no leukocytosis but does have mild elevation in neutrophils -Patient noted increased urinary output and decrease in peripheral edema with doubling of outpatient oral Lasix therefore will give Lasix 80 mg IV twice a day 4 doses -Echocardiogram completed January 2016 with preserved LV function-at this juncture no indication to  repeat -Check: Oximetry -If symptoms do not respond to above plan and will need to consult cardiology  Active Problems:   Essential hypertension - Currently controlled -Continue Norvasc and Toprol    CKD (chronic kidney disease), stage III -Renal function stable and at baseline    COPD, moderate -Currently compensated without wheezing -Continue when necessary albuterol inhaler    HLD (hyperlipidemia) -Continue Pravachol    Iron deficiency anemia -Hemoglobin stable and at baseline    BARRETTS ESOPHAGUS -Continue Protonix    DVT Prophylaxis: Lovenox  Family Communication:  Daughter at bedside   Code Status:  DO NOT RESUSCITATE  Condition:  Stable  Discharge disposition: Anticipate discharge within next 24-48 hours pending resolution of heart failure symptoms  Time spent in minutes : 60      Talli Kimmer L. ANP on 09/12/2014 at 3:53 PM  Between 7am to 7pm - Pager - [phone]  After 7pm go to [email or web address] - password [email or web address]  And look for the night coverage person covering me after hours  [facility]

## 2014-09-12 NOTE — ED Notes (Signed)
Pt. Stated, i went to doctor and then they called me to come here cause my oxygen level was low. Denies any pain.

## 2014-09-12 NOTE — ED Notes (Signed)
Hospitalist at bedside 

## 2014-09-12 NOTE — ED Provider Notes (Signed)
CSN: [id number]     Arrival date & time 09/12/14  1141 History   First MD Initiated Contact with Patient 09/12/14 1301     Chief Complaint  Patient presents with  . Shortness of Breath      HPI Patient presents emergency room with increasing shortness breath the last few days along with increasing lower extremity edema.  Patient went to her doctor yesterday and he called and told her to come in the emergency room today.  Patient denies fever, chills, productive cough.  Has has had exertional shortness of breath.  Patient denies chest pain. Past Medical History  Diagnosis Date  . HYPERLIPIDEMIA 11/18/2006  . HYPERKALEMIA 02/05/2008  . ANEMIA-IRON DEFICIENCY 09/22/2008  . Anemia of other chronic disease 02/05/2008  . ANXIETY 06/03/2008  . DEPRESSION 04/28/2009  . HYPERTENSION 11/18/2006  . PERICARDITIS 01/03/2007  . AORTIC STENOSIS/ INSUFFICIENCY, NON-RHEUMATIC 12/03/2008  . SINUSITIS- ACUTE-NOS 11/20/2007  . SINUSITIS, CHRONIC 02/05/2008  . ALLERGIC RHINITIS 11/18/2006  . PNEUMONIA 05/02/2007  . C O P D 06/27/2008  . Stricture and stenosis of esophagus 11/14/2008  . GERD 01/03/2007  . BARRETTS ESOPHAGUS 11/14/2008  . DYSPEPSIA 03/23/2007  . PRURITUS 10/08/2008  . OSTEOARTHRITIS 11/18/2006  . OSTEOARTHRITIS, KNEE, LEFT 01/03/2007  . Cervicalgia 01/13/2009  . BACK PAIN 02/04/2009    is improved  . BURSITIS, LEFT HIP 04/18/2009  . OSTEOPOROSIS 11/18/2006  . INSOMNIA-SLEEP DISORDER-UNSPEC 06/03/2008  . FATIGUE 04/18/2009  . PERIPHERAL EDEMA 02/13/2009  . MURMUR 11/18/2006  . DYSPHAGIA UNSPECIFIED 03/23/2007  . Abdominal pain, generalized 03/01/2007  . Nonspecific (abnormal) findings on radiological and other examination of body structure 06/03/2008  . Tuberculin Test Reaction 11/18/2006  . Acute bronchitis 02/23/2010  . CHOLELITHIASIS 04/24/2010  . NEPHROLITHIASIS, HX OF 04/24/2010  . Polyarthralgia 06/09/2010  . Elevated sed rate 06/11/2010  . Positive ANA (antinuclear antibody) 06/11/2010  . Rheumatoid  factor positive 06/11/2010  . Cancer     lung ca  . 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 SPONDYLOSIS, CERVICAL, WITH RADICULOPATHY 01/13/2009  . Cervical radiculopathy 09/28/2010  . Lumbar radiculopathy 09/28/2010  . RENAL INSUFFICIENCY 02/05/2008    function has improved.  . Transfusion history 09-01-12    4'14 tranfused x 2 units  . Bleeding nose 09-01-12    past hx. none recent after having surgery  . Wound healing, delayed 09-01-12    right abdominal area   Past Surgical History  Procedure Laterality Date  . Abdominal hysterectomy    . Tubal ligation    . Tumor removal  08/06/08    from lungs  . Anterior cervical decomp/discectomy fusion  01/15/2011    Procedure: ANTERIOR CERVICAL DECOMPRESSION/DISCECTOMY FUSION 2 LEVELS;  Surgeon: Cooper Render Pool;  Location: [place] [facility];  Service: Neurosurgery;  Laterality: N/A;  Cervical Three-Four, Cervical Four-Five Anterior Cervical Decompression Fusion   . Colonscopy    . Nose surgery    . Laparoscopic appendectomy N/A 05/25/2012    Procedure: APPENDECTOMY LAPAROSCOPIC  CONVERTED TO  OPEN APPENDECTOMY, exploratory laparatomy;  Surgeon: Odis Hollingshead, MD;  Location: [facility];  Service: General;  Laterality: N/A;  . Bowel resection N/A 05/25/2012    Procedure: SMALL BOWEL RESECTION;  Surgeon: Odis Hollingshead, MD;  Location: [facility];  Service: General;  Laterality: N/A;  . Laparoscopic lysis of adhesions N/A 05/25/2012    Procedure: LAPAROSCOPIC LYSIS OF ADHESIONS;  Surgeon: Odis Hollingshead, MD;  Location: [facility];  Service: General;  Laterality: N/A;  . Appendectomy    .  Cataract extraction, bilateral  09-01-12  . Debridement of abdominal wall abscess N/A 09/07/2012    Procedure:  ABDOMINAL WOUND IRRIGATION AND TWO LAYERED CLOSURE;  Surgeon: Odis Hollingshead, MD;  Location: [facility];  Service: General;  Laterality: N/A;   Family History  Problem Relation Age of Onset  . Hyperlipidemia Other   . Hypertension Other   . Coronary artery  disease Other     several nephrew  . Heart disease Father   . Colon cancer Neg Hx   . Esophageal cancer Neg Hx   . Rectal cancer Neg Hx   . Stomach cancer Neg Hx   . Cancer Brother     ?stomach   History  Substance Use Topics  . Smoking status: Former Smoker -- 1.50 packs/day for 40 years    Types: Cigarettes    Quit date: 02/15/2006  . Smokeless tobacco: Former Systems developer  . Alcohol Use: No   OB History    No data available     Review of Systems  All other systems reviewed and are negative.     Allergies  Amoxicillin; Fentanyl; Hydrocodone; Codeine; and Penicillins  Home Medications   Prior to Admission medications   Medication Sig Start Date End Date Taking? Authorizing Provider  albuterol (PROVENTIL HFA;VENTOLIN HFA) 108 (90 BASE) MCG/ACT inhaler Inhale 1-2 puffs into the lungs 4 (four) times daily as needed for wheezing or shortness of breath. For wheezing or shortness of breath. 01/11/11   Tammy S Parrett, NP  alendronate (FOSAMAX) 70 MG tablet Take 1 tablet (70 mg total) by mouth every 7 (seven) days. Take with a full glass of water on an empty stomach. Patient takes on Mondays. 03/28/12   Biagio Borg, MD  alendronate (FOSAMAX) 70 MG tablet TAKE 1 TABLET (70 MG TOTAL) BY MOUTH EVERY 7 (SEVEN) DAYS. TAKE WITH A FULL GLASS OF WATER ON AN EMPTY STOMACH. 08/28/14   Biagio Borg, MD  ALPRAZolam Duanne Moron) 0.5 MG tablet Take 0.5 mg by mouth daily as needed for anxiety.    Historical Provider, MD  amLODipine (NORVASC) 5 MG tablet TAKE 1 TABLET (5 MG TOTAL) BY MOUTH DAILY. 10/02/13   Biagio Borg, MD  citalopram (CELEXA) 10 MG tablet TAKE 1 TABLET (10 MG TOTAL) BY MOUTH EVERY MORNING. 08/14/14   Biagio Borg, MD  furosemide (LASIX) 40 MG tablet TAKE 1 TABLET TWICE DAILY 03/21/14   Biagio Borg, MD  loratadine (CLARITIN) 10 MG tablet Take 10 mg by mouth daily as needed for allergies.    Historical Provider, MD  metoprolol succinate (TOPROL-XL) 25 MG 24 hr tablet TAKE 1 TABLET (25 MG TOTAL)  BY MOUTH EVERY MORNING. 03/15/14   Lelon Perla, MD  pantoprazole (PROTONIX) 40 MG tablet Take 40 mg by mouth daily.     Historical Provider, MD  pantoprazole (PROTONIX) 40 MG tablet TAKE 1 TABLET (40 MG TOTAL) BY MOUTH DAILY. 08/14/14   Biagio Borg, MD  pravastatin (PRAVACHOL) 40 MG tablet Take 1 tablet (40 mg total) by mouth daily. 09/05/13   Biagio Borg, MD  sulfamethoxazole-trimethoprim (BACTRIM DS,SEPTRA DS) 800-160 MG per tablet Take 1 tablet by mouth 2 (two) times daily. 07/29/14   Hendricks Limes, MD  tamsulosin (FLOMAX) 0.4 MG CAPS capsule TAKE ONE CAPSULE BY MOUTH DAILY 07/01/14   Biagio Borg, MD  traMADol (ULTRAM) 50 MG tablet Take 1 tablet (50 mg total) by mouth every 8 (eight) hours as needed. 08/13/14   Hunt Oris  John, MD   BP 169/67 mmHg  Pulse 77  Temp(Src) 99.2 F (37.3 C) (Oral)  Resp 16  Ht '5\' 2"'$  (1.575 m)  Wt 175 lb (79.379 kg)  BMI 32.00 kg/m2  SpO2 93% Physical Exam  Constitutional: She is oriented to person, place, and time. She appears well-developed and well-nourished. No distress.  HENT:  Head: Normocephalic and atraumatic.  Eyes: Pupils are equal, round, and reactive to light.  Neck: Normal range of motion.  Cardiovascular: Normal rate and intact distal pulses.   Pulmonary/Chest: No respiratory distress. She has rales.  Abdominal: Normal appearance. She exhibits no distension. There is no tenderness.  Musculoskeletal: Normal range of motion. She exhibits edema (2+ pitting bilateral).  Neurological: She is alert and oriented to person, place, and time. No cranial nerve deficit.  Skin: Skin is warm and dry. No rash noted.  Psychiatric: She has a normal mood and affect. Her behavior is normal.  Nursing note and vitals reviewed.   ED Course  Procedures (including critical care time) Labs Review Labs Reviewed  BRAIN NATRIURETIC PEPTIDE  I-STAT CHEM 8, ED  I-STAT TROPOININ, ED    Imaging Review [place] [employer]  09/12/2014   Procedure: Real-time  Ultrasound Guided Injection of right glenohumeral  joint Device: GE Logiq E  Ultrasound guided injection is preferred based studies that show increased  duration, increased effect, greater accuracy, decreased procedural pain,  increased response rate with ultrasound guided versus blind injection.  Verbal informed consent obtained.  Time-out conducted.  Noted no overlying erythema, induration, or other signs of local  infection.  Skin prepped in a sterile fashion.  Local anesthesia: Topical Ethyl chloride.  With sterile technique and under real time ultrasound guidance: Joint  visualized. 23g 1  inch needle inserted posterior approach. Pictures  taken for needle placement. Patient did have injection of 2 cc of 1%  lidocaine, 2 cc of 0.5% Marcaine, and 1.0 cc of Kenalog 40 mg/dL. Completed without difficulty  Pain immediately resolved suggesting accurate placement of the medication.   Advised to call if fevers/chills, erythema, induration, drainage, or  persistent bleeding.  Images permanently stored and available for review in the ultrasound unit.   Impression: Technically successful ultrasound guided injection.  Procedure: Real-time Ultrasound Guided Injection of left glenohumeral  joint Device: GE Logiq E  Ultrasound guided injection is preferred based studies that show increased  duration, increased effect, greater accuracy, decreased procedural pain,  increased response rate with ultrasound guided versus blind injection.  Verbal informed consent obtained.  Time-out conducted.  Noted no overlying erythema, induration, or other signs of local  infection.  Skin prepped in a sterile fashion.  Local anesthesia: Topical Ethyl chloride.  With sterile technique and under real time ultrasound guidance: Joint  visualized. 23g 1  inch needle inserted posterior approach. Pictures  taken for needle placement. Patient did have injection of 2 cc of 1%  lidocaine, 2 cc of 0.5% Marcaine, and 1cc of Kenalog  40 mg/dL. Completed without difficulty  Pain immediately resolved suggesting accurate placement of the medication.   Advised to call if fevers/chills, erythema, induration, drainage, or  persistent bleeding.  Images permanently stored and available for review in the ultrasound unit.   Impression: Technically successful ultrasound guided injection.    EKG Interpretation   Date/Time:  Thursday September 12 2014 11:52:03 EDT Ventricular Rate:  67 PR Interval:  150 QRS Duration: 80 QT Interval:  432 QTC Calculation: 456 R Axis:   22 Text Interpretation:  Sinus rhythm with Premature atrial complexes  Otherwise normal ECG Confirmed by Leathia Farnell  MD, Jett Kulzer ([id number]) on 09/12/2014  1:02:20 PM      MDM   Final diagnoses:  SOB (shortness of breath)        Leonard Schwartz, MD 09/12/14 [phone]

## 2014-09-13 ENCOUNTER — Ambulatory Visit: Payer: Medicare HMO | Admitting: Nurse Practitioner

## 2014-09-13 ENCOUNTER — Inpatient Hospital Stay (HOSPITAL_COMMUNITY): Payer: Medicare HMO

## 2014-09-13 DIAGNOSIS — E785 Hyperlipidemia, unspecified: Secondary | ICD-10-CM

## 2014-09-13 DIAGNOSIS — I10 Essential (primary) hypertension: Secondary | ICD-10-CM

## 2014-09-13 DIAGNOSIS — I5033 Acute on chronic diastolic (congestive) heart failure: Principal | ICD-10-CM

## 2014-09-13 DIAGNOSIS — J449 Chronic obstructive pulmonary disease, unspecified: Secondary | ICD-10-CM

## 2014-09-13 DIAGNOSIS — D509 Iron deficiency anemia, unspecified: Secondary | ICD-10-CM

## 2014-09-13 LAB — BASIC METABOLIC PANEL
Anion gap: 11 (ref 5–15)
BUN: 35 mg/dL — ABNORMAL HIGH (ref 6–20)
CALCIUM: 9.2 mg/dL (ref 8.9–10.3)
CO2: 28 mmol/L (ref 22–32)
CREATININE: 2.2 mg/dL — AB (ref 0.44–1.00)
Chloride: 99 mmol/L — ABNORMAL LOW (ref 101–111)
GFR calc Af Amer: 24 mL/min — ABNORMAL LOW (ref >60)
GFR calc non Af Amer: 21 mL/min — ABNORMAL LOW (ref >60)
Glucose, Bld: 89 mg/dL (ref 65–99)
Potassium: 4.2 mmol/L (ref 3.5–5.1)
SODIUM: 138 mmol/L (ref 135–145)

## 2014-09-13 NOTE — Progress Notes (Signed)
Utilization review completed. Harlem Thresher, RN, BSN. 

## 2014-09-13 NOTE — Care Management Note (Signed)
Case Management Note  Patient Details  Name: [name] MRN: [id number] Date of Birth: 28-Nov-1940  Subjective/Objective:               CHF, Pulm HTN     Action/Plan: Home   Expected Discharge Date:  09/14/2014               Expected Discharge Plan:  Home/Self Care  In-House Referral:     Discharge planning Services  CM Consult  Post Acute Care Choice:    Choice offered to:     DME Arranged:  3-N-1 DME Agency:  [place]:    [facility] Agency:     Status of Service:  Completed, signed off  Medicare Important Message Given:    Date Medicare IM Given:    Medicare IM give by:    Date Additional Medicare IM Given:    Additional Medicare Important Message give by:     If discussed at [place] of Stay Meetings, dates discussed:    Additional Comments: NCM spoke to pt and she lives alone. Gave permission to speak with dtr, [name]. Dtr states pt does well at home with giving medications. She had oxygen in the past but does not have at home currently. She has RW and cane at home. Requesting 3n1 for home. Her bathroom is a distance from her bed. Waiting final recommendations for home. Contacted [facility] for 3n1 for home.  Erenest Rasher, RN 09/13/2014, 11:30 AM

## 2014-09-13 NOTE — Progress Notes (Signed)
[facility] PROGRESS NOTE  [name] MWN:[id number] DOB: 1940/11/22 DOA: 09/12/2014 PCP: Cathlean Cower, MD  Assessment/Plan: 1-acute on chronic diastolic heart failure: appears to be secondary to diet indiscretion -good response with use of lasix -continue daily weights and strict intake/output -patient improving -will continue low sodium diet and IV lasix for another 24 hours -started on flutter valve -will repeat CXR in the morning  2-acute on chronic renal failure: appears to be secondary to decrease perfusion with ongoing heart failure exacerbation -will follow renal function -continue current amount of lasix -Cr is better than what it was with diuresis -stage 3 at baseline  3-abd pain: unclear etiology at this moment. Maybe due to obstipation -no nausea, no vomiting  4-essential HTN: stable currently -continue current regimen  5-iron deficiency anemia: Hgb stable -will monitor trend  6-COPD: currently compensated -will continue COPD regimen   7-HLD: will continue statins   Code Status: DNR Family Communication: daughter at bedside Disposition Plan: remains inpatient for another 24 hours; continue IV lasix; explore abd pain   Consultants:  None   Procedures:  Recent Echo: 08/26/14 - Left ventricle: The cavity size was normal. Wall thickness was increased in a pattern of mild LVH. Systolic function was normal. The estimated ejection fraction was in the range of 60% to 65%. Wall motion was normal; there were no regional wall motion abnormalities. Doppler parameters are consistent with abnormal left ventricular relaxation (grade 1 diastolic dysfunction). The E/e&' ratio is between 8-15, suggesting indeterminate LV filling pressure. - Aortic valve: Sclerosis without stenosis. Transvalvular velocity was minimally increased. There was trivial to mild regurgitation. - Mitral valve: Mildly thickened leaflets . There was  mild regurgitation. - Left atrium: The atrium was at the upper limits of normal in size. - Right ventricle: The cavity size was mildly dilated. Systolic function was normal. - Right atrium: Severerly dilated at 30 cm2. - Tricuspid valve: There was moderate to severe regurgitation. - Pulmonary arteries: PA peak pressure: 72 mm Hg (S). - Inferior vena cava: The vessel was dilated. The respirophasic diameter changes were blunted (< 50%), consistent with elevated central venous pressure.  Impressions:  - Compared to the prior study in 2014, the EF is higher at 60-65%, trivial to mild AI, mild MR, and there is moderate to severe TR with moderate to severe pulmonary hypertension, RVSP 72 mmHg.  Antibiotics:  None   HPI/Subjective: Feeling better and with significant improvement in her breathing; patient is neg 2.8 L since admission. No fever and denies CP  Objective: Filed Vitals:   09/13/14 1004  BP: 142/49  Pulse: 66  Temp:   Resp: 18    Intake/Output Summary (Last 24 hours) at 09/13/14 1359 Last data filed at 09/13/14 1319  Gross per 24 hour  Intake   1140 ml  Output   4000 ml  Net  -2860 ml   Filed Weights   09/12/14 1157 09/12/14 1702 09/13/14 0543  Weight: 79.379 kg (175 lb) 77.973 kg (171 lb 14.4 oz) 76.34 kg (168 lb 4.8 oz)    Exam:   General:  Afebrile, breathing better and no complaining of CP. Patient with diffuse abd pain, no nausea, no vomiting   Cardiovascular: S1 and S2, positive SEM, no rubs or gallops 1. Respiratory: positive crackles at bases bilaterally   Abdomen:  soft, tender to palpation (mainly RUQ, LUQ and LLQ); positive BS  Musculoskeletal: no edema, no cyanosis or clubbing   Data Reviewed: Basic Metabolic Panel:  Recent Labs Lab 09/12/14  1415 09/12/14 1615 09/13/14 0330  NA 138 136 138  K 4.3 4.1 4.2  CL 98* 98* 99*  CO2  --  27 28  GLUCOSE 97 101* 89  BUN 44* 34* 35*  CREATININE 2.40* 2.39* 2.20*  CALCIUM  --   9.3 9.2   Liver Function Tests:  Recent Labs Lab 09/12/14 1615  AST 16  ALT 22  ALKPHOS 54  BILITOT 0.6  PROT 6.9  ALBUMIN 3.7   CBC:  Recent Labs Lab 09/12/14 1335 09/12/14 1415  WBC 7.5  --   NEUTROABS 6.2  --   HGB 12.1 17.3*  HCT 37.2 51.0*  MCV 98.7  --   PLT 215  --    BNP (last 3 results)  Recent Labs  09/12/14 1335  BNP 626.5*    ProBNP (last 3 results)  Recent Labs  09/11/14 1121  PROBNP 488.0*   Studies: Dg Chest 2 View  09/12/2014   CLINICAL DATA:  Shortness of breath for 2 days. Patient had left upper lobe removed due to lung cancer.  EXAM: CHEST  2 VIEW  COMPARISON:  March 15, 2014  FINDINGS: The heart size and mediastinal contours are stable. The heart size is enlarged. There is pulmonary vascular congestion. Patchy consolidation of right lung base is identified at least in part due to atelectasis but superimposed pneumonia is not excluded. There is minimal left pleural effusion. The visualized skeletal structures are stable.  IMPRESSION: Pulmonary vascular congestion.  Patchy consolidation right lung base at least in part due to atelectasis but superimposed pneumonia is not excluded.   Electronically Signed   By: Abelardo Diesel M.D.   On: 09/12/2014 14:15    Scheduled Meds: . amLODipine  5 mg Oral Daily  . aspirin  325 mg Oral Daily  . citalopram  10 mg Oral Daily  . enoxaparin (LOVENOX) injection  30 mg Subcutaneous Q24H  . furosemide  80 mg Intravenous BID  . metoprolol succinate  25 mg Oral Daily  . pantoprazole  40 mg Oral Daily  . potassium chloride  20 mEq Oral BID  . pravastatin  40 mg Oral q1800  . sodium chloride  3 mL Intravenous Q12H   Continuous Infusions:   Principal Problem:   Acute on chronic diastolic CHF (congestive heart failure), NYHA class 1 Active Problems:   HLD (hyperlipidemia)   Iron deficiency anemia   Essential hypertension   BARRETTS ESOPHAGUS   CKD (chronic kidney disease), stage III   COPD, moderate    Acute respiratory failure   Pulmonary hypertension, moderate to severe   Severe tricuspid regurgitation   Acute diastolic heart failure, NYHA class 1    Time spent: 30 minutes     Barton Dubois  [facility] Pager [phone]. If 7PM-7AM, please contact night-coverage at [email or web address], password [email or web address] 09/13/2014, 1:59 PM  LOS: 1 day

## 2014-09-14 DIAGNOSIS — N183 Chronic kidney disease, stage 3 unspecified: Secondary | ICD-10-CM | POA: Insufficient documentation

## 2014-09-14 DIAGNOSIS — R0602 Shortness of breath: Secondary | ICD-10-CM

## 2014-09-14 DIAGNOSIS — I071 Rheumatic tricuspid insufficiency: Secondary | ICD-10-CM

## 2014-09-14 LAB — BASIC METABOLIC PANEL
Anion gap: 12 (ref 5–15)
BUN: 41 mg/dL — ABNORMAL HIGH (ref 6–20)
CHLORIDE: 96 mmol/L — AB (ref 101–111)
CO2: 30 mmol/L (ref 22–32)
Calcium: 9.7 mg/dL (ref 8.9–10.3)
Creatinine, Ser: 2.15 mg/dL — ABNORMAL HIGH (ref 0.44–1.00)
GFR calc Af Amer: 25 mL/min — ABNORMAL LOW (ref >60)
GFR, EST NON AFRICAN AMERICAN: 21 mL/min — AB (ref >60)
GLUCOSE: 101 mg/dL — AB (ref 65–99)
POTASSIUM: 4.7 mmol/L (ref 3.5–5.1)
SODIUM: 138 mmol/L (ref 135–145)

## 2014-09-14 LAB — PROCALCITONIN

## 2014-09-14 MED ORDER — POTASSIUM CHLORIDE CRYS ER 20 MEQ PO TBCR: 20.0000 meq | EXTENDED_RELEASE_TABLET | Freq: Every day | ORAL | Status: DC

## 2014-09-14 MED ORDER — FUROSEMIDE 40 MG PO TABS: 60.0000 mg | ORAL_TABLET | Freq: Two times a day (BID) | ORAL | Status: DC

## 2014-09-14 NOTE — Progress Notes (Signed)
SATURATION QUALIFICATIONS: (This note is used to comply with regulatory documentation for home oxygen)  Patient Saturations on Room Air at Rest = 92%  Patient Saturations on Room Air while Ambulating = 86%  Patient Saturations on 3 Liters of oxygen while Ambulating = 96%

## 2014-09-14 NOTE — Discharge Summary (Signed)
Physician Discharge Summary  [name] YQM:[id number] DOB: May 25, 1940 DOA: 09/12/2014  PCP: Cathlean Cower, MD  Admit date: 09/12/2014 Discharge date: 09/14/2014  Time spent: >30 minutes  Recommendations for Outpatient Follow-up:  1. Repeat BMET to follow electrolytes and renal function 2. Reassess BP and adjust antihypertensive agents as needed 3. Please follow volume status closely  Discharge Diagnoses:  Principal Problem:   Acute on chronic diastolic CHF (congestive heart failure), NYHA class 1 Active Problems:   HLD (hyperlipidemia)   Iron deficiency anemia   Essential hypertension   BARRETTS ESOPHAGUS   CKD (chronic kidney disease), stage III   COPD, moderate   Acute respiratory failure   Pulmonary hypertension, moderate to severe   Severe tricuspid regurgitation   Acute diastolic heart failure, NYHA class 1   Discharge Condition: stable and improved. Discharge home and will follow with PCP in 10 days  Diet recommendation: heart healthy diet (low sodium, < 2000 mg daily)  Filed Weights   09/12/14 1702 09/13/14 0543 09/14/14 0512  Weight: 77.973 kg (171 lb 14.4 oz) 76.34 kg (168 lb 4.8 oz) 74.844 kg (165 lb)    History of present illness:  74 year old female patient with underlying mild to moderate COPD, severe pulmonary hypertension with associated moderate to severe tricuspid regurgitation and associated grade 1 chronic diastolic heart failure. She also has a history of Barrett's esophagitis, chronic iron deficiency anemia, chronic back pain and osteoarthritis. She presented to the ER with the history of progressive dyspnea over 3 days with associated lower extremity edema. She's not had any orthopnea but has noticed dyspnea on exertion as well for 3 days. She's not had any cough or fever or other constitutional symptoms. She notified her cardiologist yesterday who instructed her to double up on her Lasix to 80 mg twice a day for at least 3 days. Patient reports that since  doubling up on her Lasix she has noticed increased urinary output and decreased swelling in her legs but no improvement in the shortness of breath. She notified the cardiology service and they instructed her to come to the ER further evaluation.  Hospital Course:  1-acute on chronic diastolic heart failure: appears to be secondary to diet indiscretion -good response with use of lasix IV -at discharge no JVD, no leg swelling and no crackles on exam -patient discharge on lasix (dose adjusted to '60mg'$  BID) -advise to follow low sodium diet and to check her weight on daily basis -advise to use flutter valve as instructed   2-acute on chronic renal failure: appears to be secondary to decrease perfusion with ongoing heart failure exacerbation -improved with diuresis and back to her baseline at discharge -will recommend follow up of BMET during [place] visit (especially given lasix adjustments) -CKD stage 3 at baseline  3-abd pain: unclear etiology at this moment. Maybe due to obstipation -no nausea, no vomiting -reports passing some flatus and no having pain on day of discharge  4-essential HTN: stable and well controlled -continue current regimen -tolerated adjustment on her lasix -will recommend low sodium diet  5-iron deficiency anemia: Hgb stable -will monitor trend  6-COPD: currently compensated -will continue COPD regimen  -no wheezing at discharge  7-HLD: will continue statins  8-Hypoxia: -patient reported having to use some oxygen in the past due to same -found to have O2 sat in the 86-87% on activity -resolved with O2 supplementation by [place] 2-3L -home oxygen arranged  Procedures:  See below for x-ray reports   Consultations:  None  Discharge Exam: Filed Vitals:   09/14/14 1300  BP: 135/53  Pulse: 66  Temp: 98 F (36.7 C)  Resp: 18    General: Afebrile, breathing significantly improved; no complaining of CP, N/V or abd pain.   Cardiovascular: S1 and S2,  positive SEM, no rubs or gallops; no JVD  Respiratory: improved air movent, no crackles, no wheezing; mild decrease BS at bases   Abdomen: soft, tender to palpation (mainly RUQ, LUQ and LLQ); positive BS  Musculoskeletal: no edema, no cyanosis or clubbing  Discharge Instructions   Discharge Instructions    Diet - low sodium heart healthy    Complete by:  As directed      Discharge instructions    Complete by:  As directed   Take medications as prescribed Noticed that your Furosemide has been adjusted to 60 mg BID instead of '40mg'$  Follow low sodium diet (< 2000 mg daily) Check your weight on daily basis (contact PCP if you gain more than 3 pounds overnight and/or more 5 pounds in a week) Wear oxygen supplementation as instructed (especially on exertion; 2-3L) Arrange follow up with PCP in 10 days          Current Discharge Medication List    START taking these medications   Details  potassium chloride SA (K-DUR,KLOR-CON) 20 MEQ tablet Take 1 tablet (20 mEq total) by mouth daily. Qty: 30 tablet, Refills: 1      CONTINUE these medications which have CHANGED   Details  furosemide (LASIX) 40 MG tablet Take 1.5 tablets (60 mg total) by mouth 2 (two) times daily. Qty: 90 tablet, Refills: 1      CONTINUE these medications which have NOT CHANGED   Details  albuterol (PROVENTIL HFA;VENTOLIN HFA) 108 (90 BASE) MCG/ACT inhaler Inhale 1-2 puffs into the lungs 4 (four) times daily as needed for wheezing or shortness of breath. For wheezing or shortness of breath.    alendronate (FOSAMAX) 70 MG tablet Take 1 tablet (70 mg total) by mouth every 7 (seven) days. Take with a full glass of water on an empty stomach. Patient takes on Mondays. Qty: 4 tablet, Refills: 11    amLODipine (NORVASC) 5 MG tablet TAKE 1 TABLET (5 MG TOTAL) BY MOUTH DAILY. Qty: 90 tablet, Refills: 3    aspirin 325 MG tablet Take 325 mg by mouth daily.    citalopram (CELEXA) 10 MG tablet TAKE 1 TABLET (10 MG  TOTAL) BY MOUTH EVERY MORNING. Qty: 90 tablet, Refills: 2    loratadine (CLARITIN) 10 MG tablet Take 10 mg by mouth daily as needed for allergies.   Associated Diagnoses: Bronchogenic cancer of left lung    metoprolol succinate (TOPROL-XL) 25 MG 24 hr tablet TAKE 1 TABLET (25 MG TOTAL) BY MOUTH EVERY MORNING. Qty: 90 tablet, Refills: 3    pantoprazole (PROTONIX) 40 MG tablet TAKE 1 TABLET (40 MG TOTAL) BY MOUTH DAILY. Qty: 90 tablet, Refills: 3    pravastatin (PRAVACHOL) 40 MG tablet Take 1 tablet (40 mg total) by mouth daily. Qty: 90 tablet, Refills: 3    traMADol (ULTRAM) 50 MG tablet Take 1 tablet (50 mg total) by mouth every 8 (eight) hours as needed. Qty: 60 tablet, Refills: 2       Allergies  Allergen Reactions  . Amoxicillin Itching  . Fentanyl Other (See Comments)    hallucinations  . Hydrocodone Other (See Comments)    ineffective  . Codeine Hives and Rash  . Penicillins Hives and Rash   Follow-up  Information    Follow up with Cathlean Cower, MD. Schedule an appointment as soon as possible for a visit in 10 days.   Specialties:  Internal Medicine, Radiology   Contact information:   [address] [place] [phone]       The results of significant diagnostics from this hospitalization (including imaging, microbiology, ancillary and laboratory) are listed below for reference.    Significant Diagnostic Studies: Dg Chest 2 View  09/13/2014   CLINICAL DATA:  Short of breath  EXAM: CHEST  2 VIEW  COMPARISON:  09/12/2014  FINDINGS: Enlarged cardiac silhouette. Bibasilar airspace disease again demonstrated. Upper lungs are clear. Small effusions.  IMPRESSION: Cardiomegaly and bibasilar airspace disease representing edema or pneumonia. No interval improvement.   Electronically Signed   By: Suzy Bouchard M.D.   On: 09/13/2014 16:39   Dg Chest 2 View  09/12/2014   CLINICAL DATA:  Shortness of breath for 2 days. Patient had left upper lobe removed  due to lung cancer.  EXAM: CHEST  2 VIEW  COMPARISON:  March 15, 2014  FINDINGS: The heart size and mediastinal contours are stable. The heart size is enlarged. There is pulmonary vascular congestion. Patchy consolidation of right lung base is identified at least in part due to atelectasis but superimposed pneumonia is not excluded. There is minimal left pleural effusion. The visualized skeletal structures are stable.  IMPRESSION: Pulmonary vascular congestion.  Patchy consolidation right lung base at least in part due to atelectasis but superimposed pneumonia is not excluded.   Electronically Signed   By: Abelardo Diesel M.D.   On: 09/12/2014 14:15   [place] [employer]  09/12/2014   Procedure: Real-time Ultrasound Guided Injection of right glenohumeral  joint Device: GE Logiq E  Ultrasound guided injection is preferred based studies that show increased  duration, increased effect, greater accuracy, decreased procedural pain,  increased response rate with ultrasound guided versus blind injection.  Verbal informed consent obtained.  Time-out conducted.  Noted no overlying erythema, induration, or other signs of local  infection.  Skin prepped in a sterile fashion.  Local anesthesia: Topical Ethyl chloride.  With sterile technique and under real time ultrasound guidance: Joint  visualized. 23g 1  inch needle inserted posterior approach. Pictures  taken for needle placement. Patient did have injection of 2 cc of 1%  lidocaine, 2 cc of 0.5% Marcaine, and 1.0 cc of Kenalog 40 mg/dL. Completed without difficulty  Pain immediately resolved suggesting accurate placement of the medication.   Advised to call if fevers/chills, erythema, induration, drainage, or  persistent bleeding.  Images permanently stored and available for review in the ultrasound unit.   Impression: Technically successful ultrasound guided injection.  Procedure: Real-time Ultrasound Guided Injection of left glenohumeral  joint Device:  GE Logiq E  Ultrasound guided injection is preferred based studies that show increased  duration, increased effect, greater accuracy, decreased procedural pain,  increased response rate with ultrasound guided versus blind injection.  Verbal informed consent obtained.  Time-out conducted.  Noted no overlying erythema, induration, or other signs of local  infection.  Skin prepped in a sterile fashion.  Local anesthesia: Topical Ethyl chloride.  With sterile technique and under real time ultrasound guidance: Joint  visualized. 23g 1  inch needle inserted posterior approach. Pictures  taken for needle placement. Patient did have injection of 2 cc of 1%  lidocaine, 2 cc of 0.5% Marcaine, and 1cc of Kenalog 40 mg/dL. Completed without difficulty  Pain immediately resolved suggesting accurate placement of the medication.   Advised to call if fevers/chills, erythema, induration, drainage, or  persistent bleeding.  Images permanently stored and available for review in the ultrasound unit.   Impression: Technically successful ultrasound guided injection.  Dg Abd 2 Views  09/13/2014   CLINICAL DATA:  Shortness of breath and abdominal pain.  EXAM: ABDOMEN - 2 VIEW  COMPARISON:  CT of the abdomen and pelvis on 08/03/2013  FINDINGS: No evidence of bowel obstruction or ileus. Calcified gallstones visualized. No free air is seen. Degenerative changes are seen in the lower lumbar spine. Visualized lung bases show bibasilar atelectasis/ infiltrates, right greater than left.  IMPRESSION: No evidence of bowel obstruction or free air. Calcified gallstones are visible.   Electronically Signed   By: Aletta Edouard M.D.   On: 09/13/2014 16:29   Labs: Basic Metabolic Panel:  Recent Labs Lab 09/12/14 1415 09/12/14 1615 09/13/14 0330 09/14/14 0600  NA 138 136 138 138  K 4.3 4.1 4.2 4.7  CL 98* 98* 99* 96*  CO2  --  '27 28 30  '$ GLUCOSE 97 101* 89 101*  BUN 44* 34* 35* 41*  CREATININE 2.40* 2.39* 2.20* 2.15*   CALCIUM  --  9.3 9.2 9.7   Liver Function Tests:  Recent Labs Lab 09/12/14 1615  AST 16  ALT 22  ALKPHOS 54  BILITOT 0.6  PROT 6.9  ALBUMIN 3.7   CBC:  Recent Labs Lab 09/12/14 1335 09/12/14 1415  WBC 7.5  --   NEUTROABS 6.2  --   HGB 12.1 17.3*  HCT 37.2 51.0*  MCV 98.7  --   PLT 215  --    BNP (last 3 results)  Recent Labs  09/12/14 1335  BNP 626.5*    ProBNP (last 3 results)  Recent Labs  09/11/14 1121  PROBNP 488.0*    Signed:  Barton Dubois  [facility] 09/14/2014, 3:32 PM

## 2014-09-14 NOTE — Progress Notes (Signed)
Pt has orders to be discharged. Discharge instructions given and pt has no additional questions at this time. Medication regimen reviewed and pt educated. Pt verbalized understanding and has no additional questions. Telemetry box removed. IV removed and site in good condition. Pt stable and waiting for transportation.   Journei Thomassen RN 

## 2014-09-14 NOTE — Care Management Note (Signed)
Case Management Note  Patient Details  Name: [name] MRN: [id number] Date of Birth: [birth year]  Subjective/Objective:                   acute on chronic diastolic heart failure Action/Plan: Discharge planning  Expected Discharge Date:  09/14/14              Expected Discharge Plan:  Home/Self Care  In-House Referral:     Discharge planning Services  CM Consult  Post Acute Care Choice:    Choice offered to:     DME Arranged:  3-N-1, Oxygen DME Agency:  [place]:    [facility] Agency:     Status of Service:  Completed, signed off  Medicare Important Message Given:    Date Medicare IM Given:    Medicare IM give by:    Date Additional Medicare IM Given:    Additional Medicare Important Message give by:     If discussed at [place] of Stay Meetings, dates discussed:    Additional Comments: CM received call from RN to please arrange for home oxygen.  Cm called [facility] rep, Tiffany with request. Order and pulmonary saturation note in EPIC.  CM called [facility] DME rep, Jeneen Rinks to please deliver the 3n1 and O2 to room so pt can discharge.  No other CM needs were communicated. Dellie Catholic, RN 09/14/2014, 3:39 PM

## 2014-09-16 ENCOUNTER — Telehealth: Payer: Self-pay | Admitting: *Deleted

## 2014-09-16 NOTE — Telephone Encounter (Signed)
Transition Care Management Follow-up Telephone Call    Date discharged? 09/14/14   How have you been since you were released from the hospital? Pt states she is doing fairly well   Do you understand why you were in the hospital? YES   Do you understand the discharge instructions? YES   Where were you discharged to? Home   Items Reviewed:  Medications reviewed: YES  Allergies reviewed: YES  Dietary changes reviewed: YES must follow low sodium diet & heart healthy  Referrals reviewed: No referral needed   Functional Questionnaire:   Activities of Daily Living (ADLs):   She states they are independent in the following: ambulation, bathing and hygiene, feeding, continence, grooming, toileting and dressing States she doesn't require assistance    Any transportation issues/concerns?:NO   Any patient concerns? NO   Confirmed importance and date/time of follow-up visits scheduled YES  Provider Appointment booked with Dr. Jenny Reichmann on 09/24/14  Confirmed with patient if condition begins to worsen call PCP or go to the ER.  Patient was given the office number and encouraged to call back with question or concerns.  YES

## 2014-09-24 ENCOUNTER — Encounter: Payer: Self-pay | Admitting: Internal Medicine

## 2014-09-24 ENCOUNTER — Ambulatory Visit (INDEPENDENT_AMBULATORY_CARE_PROVIDER_SITE_OTHER): Payer: Medicare HMO | Admitting: Internal Medicine

## 2014-09-24 ENCOUNTER — Other Ambulatory Visit (INDEPENDENT_AMBULATORY_CARE_PROVIDER_SITE_OTHER): Payer: Medicare HMO

## 2014-09-24 VITALS — BP 122/76 | HR 69 | Temp 98.3°F | Ht 62.0 in | Wt 167.0 lb

## 2014-09-24 DIAGNOSIS — I5033 Acute on chronic diastolic (congestive) heart failure: Secondary | ICD-10-CM | POA: Diagnosis not present

## 2014-09-24 DIAGNOSIS — I10 Essential (primary) hypertension: Secondary | ICD-10-CM | POA: Diagnosis not present

## 2014-09-24 DIAGNOSIS — J9611 Chronic respiratory failure with hypoxia: Secondary | ICD-10-CM

## 2014-09-24 DIAGNOSIS — J449 Chronic obstructive pulmonary disease, unspecified: Secondary | ICD-10-CM

## 2014-09-24 LAB — BASIC METABOLIC PANEL
BUN: 71 mg/dL — ABNORMAL HIGH (ref 6–23)
CALCIUM: 9.7 mg/dL (ref 8.4–10.5)
CHLORIDE: 98 meq/L (ref 96–112)
CO2: 32 mEq/L (ref 19–32)
Creatinine, Ser: 2.8 mg/dL — ABNORMAL HIGH (ref 0.40–1.20)
GFR: 21.23 mL/min — ABNORMAL LOW (ref >60.00)
Glucose, Bld: 102 mg/dL — ABNORMAL HIGH (ref 70–99)
POTASSIUM: 5.2 meq/L — AB (ref 3.5–5.1)
Sodium: 138 mEq/L (ref 135–145)

## 2014-09-24 NOTE — Assessment & Plan Note (Signed)
stable overall by history and exam, recent data reviewed with pt, and pt to continue medical treatment as before,  to f/u any worsening symptoms or concerns BP Readings from Last 3 Encounters:  09/24/14 122/76  09/14/14 135/53  09/11/14 124/72   To cont current med tx

## 2014-09-24 NOTE — Progress Notes (Signed)
Subjective:    Patient ID: [name], female    DOB: [birth year], 74 y.o.   MRN: [id number]  HPI    Here to f/u; overall doing ok,  Pt denies chest pain, increasing sob or doe, wheezing, orthopnea, PND, increased LE swelling, palpitations, dizziness or syncope.  Pt denies new neurological symptoms such as new headache, or facial or extremity weakness or numbness.  Pt denies polydipsia, polyuria, or low sugar episode.   Pt denies new neurological symptoms such as new headache, or facial or extremity weakness or numbness.   Pt states overall good compliance with meds, mostly trying to follow appropriate diet, with wt overall stable,  but little exercise however.  Wants to know if can come off o2, had low o2 desat with ambulation while hospd. [place] nurse has taken her home VS, plans to return soon. No recent falls, walks with cane.  Pt denies fever, wt loss, night sweats, loss of appetite, or other constitutional symptoms  Wt Readings from Last 3 Encounters:  09/24/14 167 lb (75.751 kg)  09/14/14 165 lb (74.844 kg)  09/11/14 180 lb (81.647 kg)   Past Medical History  Diagnosis Date  . HYPERLIPIDEMIA 11/18/2006  . HYPERKALEMIA 02/05/2008  . ANEMIA-IRON DEFICIENCY 09/22/2008  . Anemia of other chronic disease 02/05/2008  . ANXIETY 06/03/2008  . DEPRESSION 04/28/2009  . HYPERTENSION 11/18/2006  . PERICARDITIS 01/03/2007  . AORTIC STENOSIS/ INSUFFICIENCY, NON-RHEUMATIC 12/03/2008  . SINUSITIS- ACUTE-NOS 11/20/2007  . SINUSITIS, CHRONIC 02/05/2008  . ALLERGIC RHINITIS 11/18/2006  . PNEUMONIA 05/02/2007  . C O P D 06/27/2008  . Stricture and stenosis of esophagus 11/14/2008  . GERD 01/03/2007  . BARRETTS ESOPHAGUS 11/14/2008  . DYSPEPSIA 03/23/2007  . PRURITUS 10/08/2008  . OSTEOARTHRITIS 11/18/2006  . OSTEOARTHRITIS, KNEE, LEFT 01/03/2007  . Cervicalgia 01/13/2009  . BACK PAIN 02/04/2009    is improved  . BURSITIS, LEFT HIP 04/18/2009  . OSTEOPOROSIS 11/18/2006  . INSOMNIA-SLEEP DISORDER-UNSPEC  06/03/2008  . FATIGUE 04/18/2009  . PERIPHERAL EDEMA 02/13/2009  . MURMUR 11/18/2006  . DYSPHAGIA UNSPECIFIED 03/23/2007  . Abdominal pain, generalized 03/01/2007  . Nonspecific (abnormal) findings on radiological and other examination of body structure 06/03/2008  . Tuberculin Test Reaction 11/18/2006  . Acute bronchitis 02/23/2010  . CHOLELITHIASIS 04/24/2010  . NEPHROLITHIASIS, HX OF 04/24/2010  . Polyarthralgia 06/09/2010  . Elevated sed rate 06/11/2010  . Positive ANA (antinuclear antibody) 06/11/2010  . Rheumatoid factor positive 06/11/2010  . Cancer     lung ca  . 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 SPONDYLOSIS, CERVICAL, WITH RADICULOPATHY 01/13/2009  . Cervical radiculopathy 09/28/2010  . Lumbar radiculopathy 09/28/2010  . RENAL INSUFFICIENCY 02/05/2008    function has improved.  . Transfusion history 09-01-12    4'14 tranfused x 2 units  . Bleeding nose 09-01-12    past hx. none recent after having surgery  . Wound healing, delayed 09-01-12    right abdominal area   Past Surgical History  Procedure Laterality Date  . Abdominal hysterectomy    . Tubal ligation    . Tumor removal  08/06/08    from lungs  . Anterior cervical decomp/discectomy fusion  01/15/2011    Procedure: ANTERIOR CERVICAL DECOMPRESSION/DISCECTOMY FUSION 2 LEVELS;  Surgeon: Cooper Render Pool;  Location: [facility];  Service: Neurosurgery;  Laterality: N/A;  Cervical Three-Four, Cervical Four-Five Anterior Cervical Decompression Fusion   . Colonscopy    . Nose surgery    . Laparoscopic appendectomy N/A 05/25/2012    Procedure:  APPENDECTOMY LAPAROSCOPIC  CONVERTED TO  OPEN APPENDECTOMY, exploratory laparatomy;  Surgeon: Odis Hollingshead, MD;  Location: [facility];  Service: General;  Laterality: N/A;  . Bowel resection N/A 05/25/2012    Procedure: SMALL BOWEL RESECTION;  Surgeon: Odis Hollingshead, MD;  Location: [facility];  Service: General;  Laterality: N/A;  . Laparoscopic lysis of adhesions N/A 05/25/2012    Procedure:  LAPAROSCOPIC LYSIS OF ADHESIONS;  Surgeon: Odis Hollingshead, MD;  Location: [facility];  Service: General;  Laterality: N/A;  . Appendectomy    . Cataract extraction, bilateral  09-01-12  . Debridement of abdominal wall abscess N/A 09/07/2012    Procedure:  ABDOMINAL WOUND IRRIGATION AND TWO LAYERED CLOSURE;  Surgeon: Odis Hollingshead, MD;  Location: [facility];  Service: General;  Laterality: N/A;    reports that she quit smoking about 8 years ago. Her smoking use included Cigarettes. She has a 60 pack-year smoking history. She has quit using smokeless tobacco. She reports that she does not drink alcohol or use illicit drugs. family history includes Cancer in her brother; Coronary artery disease in her other; Heart disease in her father; Hyperlipidemia in her other; Hypertension in her other. There is no history of Colon cancer, Esophageal cancer, Rectal cancer, or Stomach cancer. Allergies  Allergen Reactions  . Amoxicillin Itching  . Fentanyl Other (See Comments)    hallucinations  . Hydrocodone Other (See Comments)    ineffective  . Codeine Hives and Rash  . Penicillins Hives and Rash   Current Outpatient Prescriptions on File Prior to Visit  Medication Sig Dispense Refill  . albuterol (PROVENTIL HFA;VENTOLIN HFA) 108 (90 BASE) MCG/ACT inhaler Inhale 1-2 puffs into the lungs 4 (four) times daily as needed for wheezing or shortness of breath. For wheezing or shortness of breath.    Marland Kitchen alendronate (FOSAMAX) 70 MG tablet Take 1 tablet (70 mg total) by mouth every 7 (seven) days. Take with a full glass of water on an empty stomach. Patient takes on Mondays. 4 tablet 11  . amLODipine (NORVASC) 5 MG tablet TAKE 1 TABLET (5 MG TOTAL) BY MOUTH DAILY. 90 tablet 3  . aspirin 325 MG tablet Take 325 mg by mouth daily.    . citalopram (CELEXA) 10 MG tablet TAKE 1 TABLET (10 MG TOTAL) BY MOUTH EVERY MORNING. 90 tablet 2  . furosemide (LASIX) 40 MG tablet Take 1.5 tablets (60 mg total) by mouth 2 (two) times  daily. 90 tablet 1  . loratadine (CLARITIN) 10 MG tablet Take 10 mg by mouth daily as needed for allergies.    . metoprolol succinate (TOPROL-XL) 25 MG 24 hr tablet TAKE 1 TABLET (25 MG TOTAL) BY MOUTH EVERY MORNING. 90 tablet 3  . pantoprazole (PROTONIX) 40 MG tablet TAKE 1 TABLET (40 MG TOTAL) BY MOUTH DAILY. 90 tablet 3  . potassium chloride SA (K-DUR,KLOR-CON) 20 MEQ tablet Take 1 tablet (20 mEq total) by mouth daily. 30 tablet 1  . pravastatin (PRAVACHOL) 40 MG tablet Take 1 tablet (40 mg total) by mouth daily. 90 tablet 3  . traMADol (ULTRAM) 50 MG tablet Take 1 tablet (50 mg total) by mouth every 8 (eight) hours as needed. 60 tablet 2   No current facility-administered medications on file prior to visit.     Review of Systems  Constitutional: Negative for unusual diaphoresis or night sweats HENT: Negative for ringing in ear or discharge Eyes: Negative for double vision or worsening visual disturbance.  Respiratory: Negative for choking and stridor.  Gastrointestinal: Negative for vomiting or other signifcant bowel change Genitourinary: Negative for hematuria or change in urine volume.  Musculoskeletal: Negative for other MSK pain or swelling Skin: Negative for color change and worsening wound.  Neurological: Negative for tremors and numbness other than noted  Psychiatric/Behavioral: Negative for decreased concentration or agitation other than above  ]    Objective:   Physical Exam BP 122/76 mmHg  Pulse 69  Temp(Src) 98.3 F (36.8 C) (Oral)  Ht '5\' 2"'$  (1.575 m)  Wt 167 lb (75.751 kg)  BMI 30.54 kg/m2  SpO2 98% - on Home o2 2L [place] - 94-93% at rest, then 87% with ambulation on RA, then 98% again at rest on 2L [place] VS noted,  Constitutional: Pt appears in no significant distress HENT: Head: NCAT.  Right Ear: External ear normal.  Left Ear: External ear normal.  Eyes: . Pupils are equal, round, and reactive to light. Conjunctivae and EOM are normal Neck: Normal range of motion.  Neck supple.  Cardiovascular: Normal rate and regular rhythm.   Pulmonary/Chest: Effort normal and breath sounds without rales or wheezing.  Abd:  Soft, NT, ND, + BS Neurological: Pt is alert. Not confused , motor grossly intact Skin: Skin is warm. No rash, no LE edema Psychiatric: Pt behavior is normal. No agitation.     Assessment & Plan:

## 2014-09-24 NOTE — Assessment & Plan Note (Signed)
With persistent desat today with ambulation on RA; to cont the home o2 2L [place] continuouse

## 2014-09-24 NOTE — Patient Instructions (Addendum)
Your oxygen was 87% with walking without oxygen today, so we must recommend staying on the 2L oxygen continously  Your Blood Pressures, and swelling/edema was stable today  Please continue all other medications as before, and refills have been done if requested.  Please have the pharmacy call with any other refills you may need.  Please continue your efforts at being more active, low cholesterol diet, and weight control.  Please keep your appointments with your specialists as you may have planned  Please go to the LAB in the Basement (turn left off the elevator) for the tests to be done today  You will be contacted by phone if any changes need to be made immediately.  Otherwise, you will receive a letter about your results with an explanation, but please check with MyChart first.  Please remember to sign up for MyChart if you have not done so, as this will be important to you in the future with finding out test results, communicating by private email, and scheduling acute appointments online when needed.  Please return in 6 months, or sooner if needed

## 2014-09-24 NOTE — Assessment & Plan Note (Signed)
stable overall by history and exam, recent data reviewed with pt, and pt to continue medical treatment as before,  to f/u any worsening symptoms or concerns SpO2 Readings from Last 3 Encounters:  09/24/14 98%  09/14/14 100%  09/11/14 74%

## 2014-09-24 NOTE — Assessment & Plan Note (Signed)
Overall stable, cont current diuretic regimen, to f/u bmp today

## 2014-09-24 NOTE — Progress Notes (Signed)
Pre visit review using our clinic review tool, if applicable. No additional management support is needed unless otherwise documented below in the visit note. 

## 2014-09-29 ENCOUNTER — Emergency Department (HOSPITAL_COMMUNITY)
Admission: EM | Admit: 2014-09-29 | Discharge: 2014-09-30 | Disposition: A | Discharge status: Home / Self Care | Payer: Medicare HMO | Attending: Emergency Medicine | Admitting: Emergency Medicine

## 2014-09-29 ENCOUNTER — Encounter (HOSPITAL_COMMUNITY): Payer: Self-pay | Admitting: Emergency Medicine

## 2014-09-29 ENCOUNTER — Emergency Department (HOSPITAL_COMMUNITY): Payer: Medicare HMO

## 2014-09-29 DIAGNOSIS — Z87448 Personal history of other diseases of urinary system: Secondary | ICD-10-CM | POA: Diagnosis not present

## 2014-09-29 DIAGNOSIS — Z880 Allergy status to penicillin: Secondary | ICD-10-CM | POA: Insufficient documentation

## 2014-09-29 DIAGNOSIS — F419 Anxiety disorder, unspecified: Secondary | ICD-10-CM | POA: Diagnosis not present

## 2014-09-29 DIAGNOSIS — E875 Hyperkalemia: Secondary | ICD-10-CM | POA: Insufficient documentation

## 2014-09-29 DIAGNOSIS — Z7982 Long term (current) use of aspirin: Secondary | ICD-10-CM | POA: Diagnosis not present

## 2014-09-29 DIAGNOSIS — Z8701 Personal history of pneumonia (recurrent): Secondary | ICD-10-CM | POA: Insufficient documentation

## 2014-09-29 DIAGNOSIS — R011 Cardiac murmur, unspecified: Secondary | ICD-10-CM | POA: Insufficient documentation

## 2014-09-29 DIAGNOSIS — Z85118 Personal history of other malignant neoplasm of bronchus and lung: Secondary | ICD-10-CM | POA: Insufficient documentation

## 2014-09-29 DIAGNOSIS — M1990 Unspecified osteoarthritis, unspecified site: Secondary | ICD-10-CM | POA: Insufficient documentation

## 2014-09-29 DIAGNOSIS — F329 Major depressive disorder, single episode, unspecified: Secondary | ICD-10-CM | POA: Diagnosis not present

## 2014-09-29 DIAGNOSIS — E785 Hyperlipidemia, unspecified: Secondary | ICD-10-CM | POA: Insufficient documentation

## 2014-09-29 DIAGNOSIS — Z87442 Personal history of urinary calculi: Secondary | ICD-10-CM | POA: Insufficient documentation

## 2014-09-29 DIAGNOSIS — J449 Chronic obstructive pulmonary disease, unspecified: Secondary | ICD-10-CM | POA: Insufficient documentation

## 2014-09-29 DIAGNOSIS — Z87891 Personal history of nicotine dependence: Secondary | ICD-10-CM | POA: Diagnosis not present

## 2014-09-29 DIAGNOSIS — M25551 Pain in right hip: Secondary | ICD-10-CM | POA: Diagnosis not present

## 2014-09-29 DIAGNOSIS — Z79899 Other long term (current) drug therapy: Secondary | ICD-10-CM | POA: Insufficient documentation

## 2014-09-29 DIAGNOSIS — K219 Gastro-esophageal reflux disease without esophagitis: Secondary | ICD-10-CM | POA: Diagnosis not present

## 2014-09-29 DIAGNOSIS — I10 Essential (primary) hypertension: Secondary | ICD-10-CM | POA: Diagnosis not present

## 2014-09-29 DIAGNOSIS — Z862 Personal history of diseases of the blood and blood-forming organs and certain disorders involving the immune mechanism: Secondary | ICD-10-CM | POA: Diagnosis not present

## 2014-09-29 MED ORDER — ACETAMINOPHEN 500 MG PO TABS
1000.0000 mg | ORAL_TABLET | Freq: Once | ORAL | Status: AC
  Administered 2014-09-29: 1000 mg via ORAL
  Filled 2014-09-29: qty 2

## 2014-09-29 NOTE — ED Notes (Signed)
Pt. reports right hip and right leg pain onset last night , denies injury or fall , ambulatory using her cane .

## 2014-09-29 NOTE — ED Notes (Signed)
Pt up to the bedside commode

## 2014-09-29 NOTE — ED Provider Notes (Signed)
CSN: [id number]     Arrival date & time 09/29/14  1937 History   First MD Initiated Contact with Patient 09/29/14 2233     Chief Complaint  Patient presents with  . Hip Pain  . Leg Pain     (Consider location/radiation/quality/duration/timing/severity/associated sxs/prior Treatment) HPI She presents with new right hip pain. Onset was atraumatic, awakening the patient from sleep yesterday morning, since onset has been waxing/waning in severity, worse with ambulation or weightbearing. No medication taken for pain relief. Pain is focally in the posterior and lateral aspect of the right hip. She has no history of orthopedic dimension in the hip itself, nor knee. No recent falls, no recent change in activity.  Past Medical History  Diagnosis Date  . HYPERLIPIDEMIA 11/18/2006  . HYPERKALEMIA 02/05/2008  . ANEMIA-IRON DEFICIENCY 09/22/2008  . Anemia of other chronic disease 02/05/2008  . ANXIETY 06/03/2008  . DEPRESSION 04/28/2009  . HYPERTENSION 11/18/2006  . PERICARDITIS 01/03/2007  . AORTIC STENOSIS/ INSUFFICIENCY, NON-RHEUMATIC 12/03/2008  . SINUSITIS- ACUTE-NOS 11/20/2007  . SINUSITIS, CHRONIC 02/05/2008  . ALLERGIC RHINITIS 11/18/2006  . PNEUMONIA 05/02/2007  . C O P D 06/27/2008  . Stricture and stenosis of esophagus 11/14/2008  . GERD 01/03/2007  . BARRETTS ESOPHAGUS 11/14/2008  . DYSPEPSIA 03/23/2007  . PRURITUS 10/08/2008  . OSTEOARTHRITIS 11/18/2006  . OSTEOARTHRITIS, KNEE, LEFT 01/03/2007  . Cervicalgia 01/13/2009  . BACK PAIN 02/04/2009    is improved  . BURSITIS, LEFT HIP 04/18/2009  . OSTEOPOROSIS 11/18/2006  . INSOMNIA-SLEEP DISORDER-UNSPEC 06/03/2008  . FATIGUE 04/18/2009  . PERIPHERAL EDEMA 02/13/2009  . MURMUR 11/18/2006  . DYSPHAGIA UNSPECIFIED 03/23/2007  . Abdominal pain, generalized 03/01/2007  . Nonspecific (abnormal) findings on radiological and other examination of body structure 06/03/2008  . Tuberculin Test Reaction 11/18/2006  . Acute bronchitis 02/23/2010  .  CHOLELITHIASIS 04/24/2010  . NEPHROLITHIASIS, HX OF 04/24/2010  . Polyarthralgia 06/09/2010  . Elevated sed rate 06/11/2010  . Positive ANA (antinuclear antibody) 06/11/2010  . Rheumatoid factor positive 06/11/2010  . Cancer     lung ca  . 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 SPONDYLOSIS, CERVICAL, WITH RADICULOPATHY 01/13/2009  . Cervical radiculopathy 09/28/2010  . Lumbar radiculopathy 09/28/2010  . RENAL INSUFFICIENCY 02/05/2008    function has improved.  . Transfusion history 09-01-12    4'14 tranfused x 2 units  . Bleeding nose 09-01-12    past hx. none recent after having surgery  . Wound healing, delayed 09-01-12    right abdominal area   Past Surgical History  Procedure Laterality Date  . Abdominal hysterectomy    . Tubal ligation    . Tumor removal  08/06/08    from lungs  . Anterior cervical decomp/discectomy fusion  01/15/2011    Procedure: ANTERIOR CERVICAL DECOMPRESSION/DISCECTOMY FUSION 2 LEVELS;  Surgeon: Cooper Render Pool;  Location: [place] [facility];  Service: Neurosurgery;  Laterality: N/A;  Cervical Three-Four, Cervical Four-Five Anterior Cervical Decompression Fusion   . Colonscopy    . Nose surgery    . Laparoscopic appendectomy N/A 05/25/2012    Procedure: APPENDECTOMY LAPAROSCOPIC  CONVERTED TO  OPEN APPENDECTOMY, exploratory laparatomy;  Surgeon: Odis Hollingshead, MD;  Location: [facility];  Service: General;  Laterality: N/A;  . Bowel resection N/A 05/25/2012    Procedure: SMALL BOWEL RESECTION;  Surgeon: Odis Hollingshead, MD;  Location: [facility];  Service: General;  Laterality: N/A;  . Laparoscopic lysis of adhesions N/A 05/25/2012    Procedure: LAPAROSCOPIC LYSIS OF ADHESIONS;  Surgeon: Rhunette Croft  Rosenbower, MD;  Location: [facility];  Service: General;  Laterality: N/A;  . Appendectomy    . Cataract extraction, bilateral  09-01-12  . Debridement of abdominal wall abscess N/A 09/07/2012    Procedure:  ABDOMINAL WOUND IRRIGATION AND TWO LAYERED CLOSURE;  Surgeon: Odis Hollingshead,  MD;  Location: [facility];  Service: General;  Laterality: N/A;   Family History  Problem Relation Age of Onset  . Hyperlipidemia Other   . Hypertension Other   . Coronary artery disease Other     several nephrew  . Heart disease Father   . Colon cancer Neg Hx   . Esophageal cancer Neg Hx   . Rectal cancer Neg Hx   . Stomach cancer Neg Hx   . Cancer Brother     ?stomach   Social History  Substance Use Topics  . Smoking status: Former Smoker -- 1.50 packs/day for 40 years    Types: Cigarettes    Quit date: 02/15/2006  . Smokeless tobacco: Former Systems developer  . Alcohol Use: No   OB History    No data available     Review of Systems  Constitutional: Negative for fever.  Respiratory: Negative for shortness of breath.   Cardiovascular: Negative for chest pain.  Musculoskeletal:       Negative aside from HPI  Skin:       Negative aside from HPI  Allergic/Immunologic: Negative for immunocompromised state.  Neurological: Negative for weakness and numbness.      Allergies  Amoxicillin; Fentanyl; Hydrocodone; Codeine; and Penicillins  Home Medications   Prior to Admission medications   Medication Sig Start Date End Date Taking? Authorizing Provider  acetaminophen (TYLENOL) 500 MG tablet Take 1,000 mg by mouth every 6 (six) hours as needed (pain).   Yes Historical Provider, MD  alendronate (FOSAMAX) 70 MG tablet Take 1 tablet (70 mg total) by mouth every 7 (seven) days. Take with a full glass of water on an empty stomach. Patient takes on Mondays. Patient taking differently: Take 70 mg by mouth every 7 (seven) days. Take with a full glass of water on an empty stomach. On Mondays 03/28/12  Yes Biagio Borg, MD  aspirin 325 MG tablet Take 325 mg by mouth daily.   Yes Historical Provider, MD  Calcium Citrate-Vitamin D (CALCIUM + D PO) Take 1 tablet by mouth daily.   Yes Historical Provider, MD  citalopram (CELEXA) 10 MG tablet TAKE 1 TABLET (10 MG TOTAL) BY MOUTH EVERY MORNING. 08/14/14   Yes Biagio Borg, MD  FIBER SELECT GUMMIES CHEW Chew 2 tablets by mouth daily.   Yes Historical Provider, MD  furosemide (LASIX) 40 MG tablet Take 1.5 tablets (60 mg total) by mouth 2 (two) times daily. 09/14/14  Yes Barton Dubois, MD  metoprolol succinate (TOPROL-XL) 25 MG 24 hr tablet TAKE 1 TABLET (25 MG TOTAL) BY MOUTH EVERY MORNING. Patient taking differently: Take 25 mg by mouth daily.  03/15/14  Yes Lelon Perla, MD  pantoprazole (PROTONIX) 40 MG tablet TAKE 1 TABLET (40 MG TOTAL) BY MOUTH DAILY. 08/14/14  Yes Biagio Borg, MD  potassium chloride SA (K-DUR,KLOR-CON) 20 MEQ tablet Take 1 tablet (20 mEq total) by mouth daily. 09/14/14  Yes Barton Dubois, MD  pravastatin (PRAVACHOL) 40 MG tablet Take 1 tablet (40 mg total) by mouth daily. 09/05/13  Yes Biagio Borg, MD  tamsulosin (FLOMAX) 0.4 MG CAPS capsule Take 0.4 mg by mouth daily with breakfast.  09/22/14  Yes Historical Provider,  MD  traMADol (ULTRAM) 50 MG tablet Take 1 tablet (50 mg total) by mouth every 8 (eight) hours as needed. Patient taking differently: Take 50 mg by mouth every 8 (eight) hours as needed (pain).  08/13/14  Yes Biagio Borg, MD  amLODipine (NORVASC) 5 MG tablet TAKE 1 TABLET (5 MG TOTAL) BY MOUTH DAILY. Patient not taking: Reported on 09/29/2014 10/02/13   Biagio Borg, MD   BP 121/63 mmHg  Pulse 69  Temp(Src) 98.5 F (36.9 C) (Oral)  Resp 22  SpO2 100% Physical Exam  Constitutional: She is oriented to person, place, and time. She appears well-developed and well-nourished. No distress.  HENT:  Head: Normocephalic and atraumatic.  Eyes: Conjunctivae and EOM are normal.  Cardiovascular: Normal rate and regular rhythm.   Pulmonary/Chest: Effort normal and breath sounds normal. No stridor. No respiratory distress.  Abdominal: She exhibits no distension.  Musculoskeletal: She exhibits no edema.       Right hip: She exhibits tenderness and bony tenderness. She exhibits normal range of motion and normal strength.        Left hip: Normal.       Right knee: Normal.       Legs: Neurological: She is alert and oriented to person, place, and time. No cranial nerve deficit.  Skin: Skin is warm and dry.  Psychiatric: She has a normal mood and affect.  Nursing note and vitals reviewed.   ED Course  Procedures (including critical care time)  Imaging Review Dg Hip Unilat With Pelvis 2-3 Views Right  09/30/2014   CLINICAL DATA:  Acute onset of right hip pain.  Initial encounter.  EXAM: DG HIP (WITH OR WITHOUT PELVIS) 2-3V RIGHT  COMPARISON:  None.  FINDINGS: There is no evidence of fracture or dislocation. Both femoral heads are seated normally within their respective acetabula. The proximal right femur appears intact. Mild degenerative change is noted at the lower lumbar spine. Mild sclerotic change is seen at the sacroiliac joints.  The visualized bowel gas pattern is grossly unremarkable in appearance. Scattered phleboliths are noted within the pelvis.  IMPRESSION: No evidence of fracture or dislocation.   Electronically Signed   By: Garald Balding M.D.   On: 09/30/2014 00:09   I, Tylan Kinn, personally reviewed and evaluated these images and lab results as part of my medical decision-making.  On re-exam the patient is in no distress.  She and daughter are aware of all findings.  They will f/u w Ortho  MDM  Patient presents with atraumatic hip pain. Patient is afebrile, awake and alert, moving the leg spontaneously. Low suspicion for septic arthralgia. Patient is generally deconditioned, and there suspicion for arthritic etiology. No distal neurovascular compromise. Patient started on a course analgesia, to follow-up with orthopedics.  Carmin Muskrat, MD 09/30/14 [phone]

## 2014-09-29 NOTE — ED Notes (Signed)
The pt is c/o rt hip pain since last pm .  She has not fallen and denies any injury.  Alert daughter with her .  The pt lives alone.  Continuous 02 at home steady gait

## 2014-09-30 ENCOUNTER — Other Ambulatory Visit: Payer: Self-pay | Admitting: Internal Medicine

## 2014-09-30 MED ORDER — PREDNISONE 20 MG PO TABS: 40.0000 mg | ORAL_TABLET | Freq: Every day | ORAL | Status: AC

## 2014-09-30 NOTE — Discharge Instructions (Signed)
As discussed, your evaluation today has been largely reassuring.  But, it is important that you monitor your condition carefully, and do not hesitate to return to the ED if you develop new, or concerning changes in your condition.  Otherwise, please follow-up with our orthopedist for appropriate ongoing care.

## 2014-10-01 ENCOUNTER — Telehealth: Payer: Self-pay | Admitting: Internal Medicine

## 2014-10-01 NOTE — Telephone Encounter (Signed)
Mary from [facility] wanted to let us know that patient was recently in ER and is complaining of increasing of short of breath.  She is requesting more nursing visits to be sure to go over everything.  1 x for 4 more weeks [facility] can be reached at [phone]

## 2014-10-01 NOTE — Telephone Encounter (Signed)
Verbal authorization given per PCP protocol

## 2014-10-11 ENCOUNTER — Ambulatory Visit (INDEPENDENT_AMBULATORY_CARE_PROVIDER_SITE_OTHER): Payer: Medicare HMO | Admitting: Internal Medicine

## 2014-10-11 ENCOUNTER — Encounter: Payer: Self-pay | Admitting: Internal Medicine

## 2014-10-11 VITALS — BP 108/60 | HR 75 | Temp 98.6°F | Ht 62.0 in | Wt 165.0 lb

## 2014-10-11 DIAGNOSIS — N183 Chronic kidney disease, stage 3 unspecified: Secondary | ICD-10-CM

## 2014-10-11 DIAGNOSIS — J9611 Chronic respiratory failure with hypoxia: Secondary | ICD-10-CM | POA: Diagnosis not present

## 2014-10-11 DIAGNOSIS — I10 Essential (primary) hypertension: Secondary | ICD-10-CM

## 2014-10-11 DIAGNOSIS — R269 Unspecified abnormalities of gait and mobility: Secondary | ICD-10-CM

## 2014-10-11 NOTE — Progress Notes (Signed)
Pre visit review using our clinic review tool, if applicable. No additional management support is needed unless otherwise documented below in the visit note. 

## 2014-10-11 NOTE — Assessment & Plan Note (Signed)
stable overall by history and exam, recent data reviewed with pt, and pt to continue medical treatment as before,  to f/u any worsening symptoms or concerns BP Readings from Last 3 Encounters:  10/11/14 108/60  09/30/14 128/43  09/24/14 122/76

## 2014-10-11 NOTE — Progress Notes (Signed)
Subjective:    Patient ID: [name], female    DOB: 1940/09/13, 74 y.o.   MRN: [id number]  HPI  Here to fu with face to face required by insurance regarding ? Need for in home PT; Pt continues to have recurring chronic bilat LBP as well as bilat knee pain and hip pain ,but no bowel or bladder change, fever, wt loss,  worsening LE pain/numbness/weakness, gait change or falls.  Remains on home o2 for chronic hypoxic resp failure. Pt denies chest pain, increased sob or doe, wheezing, orthopnea, PND, increased LE swelling, palpitations, dizziness or syncope. But has ongoing instability and high risk for fall with worsening unsteadiness, off balance despite walking with cane.  Has hx of cervical cord compression with myelopathy, as well as spondylosis as well.  In addition has Aortic stenosis.  Has multifactorial reason for gait impairment, cannot leave the home without assist.  Has seen ortho helped by family recently who referred her for their in-house PT, but pt feels unable to do this.  No recent falls, walks with cane. Past Medical History  Diagnosis Date  . HYPERLIPIDEMIA 11/18/2006  . HYPERKALEMIA 02/05/2008  . ANEMIA-IRON DEFICIENCY 09/22/2008  . Anemia of other chronic disease 02/05/2008  . ANXIETY 06/03/2008  . DEPRESSION 04/28/2009  . HYPERTENSION 11/18/2006  . PERICARDITIS 01/03/2007  . AORTIC STENOSIS/ INSUFFICIENCY, NON-RHEUMATIC 12/03/2008  . SINUSITIS- ACUTE-NOS 11/20/2007  . SINUSITIS, CHRONIC 02/05/2008  . ALLERGIC RHINITIS 11/18/2006  . PNEUMONIA 05/02/2007  . C O P D 06/27/2008  . Stricture and stenosis of esophagus 11/14/2008  . GERD 01/03/2007  . BARRETTS ESOPHAGUS 11/14/2008  . DYSPEPSIA 03/23/2007  . PRURITUS 10/08/2008  . OSTEOARTHRITIS 11/18/2006  . OSTEOARTHRITIS, KNEE, LEFT 01/03/2007  . Cervicalgia 01/13/2009  . BACK PAIN 02/04/2009    is improved  . BURSITIS, LEFT HIP 04/18/2009  . OSTEOPOROSIS 11/18/2006  . INSOMNIA-SLEEP DISORDER-UNSPEC 06/03/2008  . FATIGUE 04/18/2009    . PERIPHERAL EDEMA 02/13/2009  . MURMUR 11/18/2006  . DYSPHAGIA UNSPECIFIED 03/23/2007  . Abdominal pain, generalized 03/01/2007  . Nonspecific (abnormal) findings on radiological and other examination of body structure 06/03/2008  . Tuberculin Test Reaction 11/18/2006  . Acute bronchitis 02/23/2010  . CHOLELITHIASIS 04/24/2010  . NEPHROLITHIASIS, HX OF 04/24/2010  . Polyarthralgia 06/09/2010  . Elevated sed rate 06/11/2010  . Positive ANA (antinuclear antibody) 06/11/2010  . Rheumatoid factor positive 06/11/2010  . Cancer     lung ca  . 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 SPONDYLOSIS, CERVICAL, WITH RADICULOPATHY 01/13/2009  . Cervical radiculopathy 09/28/2010  . Lumbar radiculopathy 09/28/2010  . RENAL INSUFFICIENCY 02/05/2008    function has improved.  . Transfusion history 09-01-12    4'14 tranfused x 2 units  . Bleeding nose 09-01-12    past hx. none recent after having surgery  . Wound healing, delayed 09-01-12    right abdominal area   Past Surgical History  Procedure Laterality Date  . Abdominal hysterectomy    . Tubal ligation    . Tumor removal  08/06/08    from lungs  . Anterior cervical decomp/discectomy fusion  01/15/2011    Procedure: ANTERIOR CERVICAL DECOMPRESSION/DISCECTOMY FUSION 2 LEVELS;  Surgeon: Cooper Render Pool;  Location: [place] [facility];  Service: Neurosurgery;  Laterality: N/A;  Cervical Three-Four, Cervical Four-Five Anterior Cervical Decompression Fusion   . Colonscopy    . Nose surgery    . Laparoscopic appendectomy N/A 05/25/2012    Procedure: APPENDECTOMY LAPAROSCOPIC  CONVERTED TO  OPEN APPENDECTOMY, exploratory laparatomy;  Surgeon: Odis Hollingshead, MD;  Location: [facility];  Service: General;  Laterality: N/A;  . Bowel resection N/A 05/25/2012    Procedure: SMALL BOWEL RESECTION;  Surgeon: Odis Hollingshead, MD;  Location: [facility];  Service: General;  Laterality: N/A;  . Laparoscopic lysis of adhesions N/A 05/25/2012    Procedure: LAPAROSCOPIC LYSIS OF ADHESIONS;   Surgeon: Odis Hollingshead, MD;  Location: [facility];  Service: General;  Laterality: N/A;  . Appendectomy    . Cataract extraction, bilateral  09-01-12  . Debridement of abdominal wall abscess N/A 09/07/2012    Procedure:  ABDOMINAL WOUND IRRIGATION AND TWO LAYERED CLOSURE;  Surgeon: Odis Hollingshead, MD;  Location: [facility];  Service: General;  Laterality: N/A;    reports that she quit smoking about 8 years ago. Her smoking use included Cigarettes. She has a 60 pack-year smoking history. She has quit using smokeless tobacco. She reports that she does not drink alcohol or use illicit drugs. family history includes Cancer in her brother; Coronary artery disease in her other; Heart disease in her father; Hyperlipidemia in her other; Hypertension in her other. There is no history of Colon cancer, Esophageal cancer, Rectal cancer, or Stomach cancer. Allergies  Allergen Reactions  . Amoxicillin Itching  . Fentanyl Other (See Comments)    hallucinations  . Hydrocodone Other (See Comments)    ineffective  . Codeine Hives and Rash  . Penicillins Hives and Rash   Current Outpatient Prescriptions on File Prior to Visit  Medication Sig Dispense Refill  . acetaminophen (TYLENOL) 500 MG tablet Take 1,000 mg by mouth every 6 (six) hours as needed (pain).    Marland Kitchen alendronate (FOSAMAX) 70 MG tablet Take 1 tablet (70 mg total) by mouth every 7 (seven) days. Take with a full glass of water on an empty stomach. Patient takes on Mondays. (Patient taking differently: Take 70 mg by mouth every 7 (seven) days. Take with a full glass of water on an empty stomach. On Mondays) 4 tablet 11  . aspirin 325 MG tablet Take 325 mg by mouth daily.    . Calcium Citrate-Vitamin D (CALCIUM + D PO) Take 1 tablet by mouth daily.    . citalopram (CELEXA) 10 MG tablet TAKE 1 TABLET (10 MG TOTAL) BY MOUTH EVERY MORNING. 90 tablet 2  . FIBER SELECT GUMMIES CHEW Chew 2 tablets by mouth daily.    . furosemide (LASIX) 40 MG tablet Take 1.5  tablets (60 mg total) by mouth 2 (two) times daily. 90 tablet 1  . metoprolol succinate (TOPROL-XL) 25 MG 24 hr tablet TAKE 1 TABLET (25 MG TOTAL) BY MOUTH EVERY MORNING. (Patient taking differently: Take 25 mg by mouth daily. ) 90 tablet 3  . pantoprazole (PROTONIX) 40 MG tablet TAKE 1 TABLET (40 MG TOTAL) BY MOUTH DAILY. 90 tablet 3  . potassium chloride SA (K-DUR,KLOR-CON) 20 MEQ tablet Take 1 tablet (20 mEq total) by mouth daily. 30 tablet 1  . pravastatin (PRAVACHOL) 40 MG tablet Take 1 tablet (40 mg total) by mouth daily. 90 tablet 3  . tamsulosin (FLOMAX) 0.4 MG CAPS capsule Take 0.4 mg by mouth daily with breakfast.     . traMADol (ULTRAM) 50 MG tablet Take 1 tablet (50 mg total) by mouth every 8 (eight) hours as needed. (Patient taking differently: Take 50 mg by mouth every 8 (eight) hours as needed (pain). ) 60 tablet 2  . amLODipine (NORVASC) 5 MG tablet Take 1 tablet (5 mg total) by mouth  daily. (Patient not taking: Reported on 10/11/2014) 90 tablet 3   No current facility-administered medications on file prior to visit.   Review of Systems  Constitutional: Negative for unusual diaphoresis or night sweats HENT: Negative for ringing in ear or discharge Eyes: Negative for double vision or worsening visual disturbance.  Respiratory: Negative for choking and stridor.   Gastrointestinal: Negative for vomiting or other signifcant bowel change Genitourinary: Negative for hematuria or change in urine volume.  Musculoskeletal: Negative for other MSK pain or swelling Skin: Negative for color change and worsening wound.  Neurological: Negative for tremors and numbness other than noted  Psychiatric/Behavioral: Negative for decreased concentration or agitation other than above       Objective:   Physical Exam BP 108/60 mmHg  Pulse 75  Temp(Src) 98.6 F (37 C) (Oral)  Ht '5\' 2"'$  (1.575 m)  Wt 165 lb (74.844 kg)  BMI 30.17 kg/m2  SpO2 98% VS noted, obese, moderate general weakness,  requires moderate assist with getting up on exam table Constitutional: Pt appears in no significant distress HENT: Head: NCAT.  Right Ear: External ear normal.  Left Ear: External ear normal.  Eyes: . Pupils are equal, round, and reactive to light. Conjunctivae and EOM are normal Neck: Normal range of motion. Neck supple.  Cardiovascular: Normal rate and regular rhythm.   Pulmonary/Chest: Effort normal and breath sounds without rales or wheezing.  Abd:  Soft, NT, ND, + BS Neurological: Pt is alert. Not confused , motor grossly intact except LLE 4+ 5 weeakness, gait moderately unsteady Skin: Skin is warm. No rash, no LE edema Psychiatric: Pt behavior is normal. No agitation.     Assessment & Plan:

## 2014-10-11 NOTE — Patient Instructions (Signed)
Please continue all other medications as before, and refills have been done if requested.  Please have the pharmacy call with any other refills you may need.  Please continue your efforts at being more active, low cholesterol diet, and weight control.  You are otherwise up to date with prevention measures today.  Please keep your appointments with your specialists as you may have planned  You will be contacted regarding the referral for: Home health with in-home Physical Therapy and aide

## 2014-10-11 NOTE — Assessment & Plan Note (Addendum)
[place] for [facility] with RN, PT in-home and aide - orders done today  Note:  Total time for pt hx, exam, review of record with pt in the room, determination of diagnoses and plan for further eval and tx is > 40 min, with over 50% spent in coordination and counseling of patient

## 2014-10-11 NOTE — Assessment & Plan Note (Signed)
stable overall by history and exam, recent data reviewed with pt, and pt to continue medical treatment as before,  to f/u any worsening symptoms or concerns Lab Results  Component Value Date   CREATININE 2.80* 09/24/2014

## 2014-10-11 NOTE — Assessment & Plan Note (Signed)
stable overall by history and exam, recent data reviewed with pt, and pt to continue medical treatment as before,  to f/u any worsening symptoms or concerns SpO2 Readings from Last 3 Encounters:  10/11/14 98%  09/30/14 100%  09/24/14 98%

## 2014-10-31 ENCOUNTER — Telehealth: Payer: Self-pay | Admitting: *Deleted

## 2014-10-31 NOTE — Telephone Encounter (Signed)
Receive call from PT wanting to inform md of pt BP it was 190/98, (P) 73, and O2 97%. No symptoms she states she feel fine. On Tuesday Bp was 160/90 don't have any other BP reading because this is the first week seeing pt...[name]

## 2014-11-01 NOTE — Telephone Encounter (Signed)
Called pt gave md response. Pt states she rather see Dr. Jenny Reichmann made appt for [name] 11/06/14 '@11'$ :00...[name]

## 2014-11-01 NOTE — Telephone Encounter (Signed)
Please make ROV; sat clinic would be ok if needed

## 2014-11-05 DIAGNOSIS — R269 Unspecified abnormalities of gait and mobility: Secondary | ICD-10-CM | POA: Diagnosis not present

## 2014-11-06 ENCOUNTER — Ambulatory Visit (INDEPENDENT_AMBULATORY_CARE_PROVIDER_SITE_OTHER): Payer: Medicare HMO | Admitting: Internal Medicine

## 2014-11-06 ENCOUNTER — Encounter: Payer: Self-pay | Admitting: Internal Medicine

## 2014-11-06 DIAGNOSIS — Z23 Encounter for immunization: Secondary | ICD-10-CM | POA: Diagnosis not present

## 2014-11-06 DIAGNOSIS — I10 Essential (primary) hypertension: Secondary | ICD-10-CM

## 2014-11-06 DIAGNOSIS — N183 Chronic kidney disease, stage 3 unspecified: Secondary | ICD-10-CM

## 2014-11-06 DIAGNOSIS — J449 Chronic obstructive pulmonary disease, unspecified: Secondary | ICD-10-CM

## 2014-11-06 MED ORDER — METOPROLOL SUCCINATE ER 50 MG PO TB24: 50.0000 mg | ORAL_TABLET | Freq: Every day | ORAL | Status: DC

## 2014-11-06 MED ORDER — ALENDRONATE SODIUM 70 MG PO TABS: 70.0000 mg | ORAL_TABLET | ORAL | Status: AC

## 2014-11-06 NOTE — Assessment & Plan Note (Signed)
stable overall by history and exam, recent data reviewed with pt, and pt to continue medical treatment as before,  to f/u any worsening symptoms or concerns SpO2 Readings from Last 3 Encounters:  11/06/14 97%  10/11/14 98%  09/30/14 100%

## 2014-11-06 NOTE — Assessment & Plan Note (Signed)
stable overall by history and exam, recent data reviewed with pt, and pt to continue medical treatment as before,  to f/u any worsening symptoms or concerns Lab Results  Component Value Date   CREATININE 2.80* 09/24/2014   Has f/u with renal planned late Nov 2016

## 2014-11-06 NOTE — Patient Instructions (Signed)
OK to increase the metoprolol ER to 50 mg per day  Please call next wk or return for visit if the BP remaiins elevated  Please continue all other medications as before, and refills have been done if requested.  Please have the pharmacy call with any other refills you may need.  Please continue your efforts at being more active, low cholesterol diet, and weight control.  You are otherwise up to date with prevention measures today.  Please keep your appointments with your specialists as you may have planned   Please return in 6 months, or sooner if needed

## 2014-11-06 NOTE — Progress Notes (Signed)
Subjective:    Patient ID: [name], female    DOB: [birth year], 74 y.o.   MRN: [id number]  HPI  Here to f/u elevated BP, was noted 120/84 at last renal visit aug 31 after amlodipine 5 mg on hold, so med further held.  Has been checked yesterday with [facility] staff at 150/75, and mildly elevated today  As well. Does c/o ongoing fatigue, but denies signficant daytime hypersomnolence. Some weakness on getting up first thing in the AM.  Pt denies chest pain, increased sob or doe, wheezing, orthopnea, PND, increased LE swelling, palpitations,  or syncope.  No falls BP Readings from Last 3 Encounters:  11/06/14 140/64  10/11/14 108/60  09/30/14 128/43   Past Medical History  Diagnosis Date  . HYPERLIPIDEMIA 11/18/2006  . HYPERKALEMIA 02/05/2008  . ANEMIA-IRON DEFICIENCY 09/22/2008  . Anemia of other chronic disease 02/05/2008  . ANXIETY 06/03/2008  . DEPRESSION 04/28/2009  . HYPERTENSION 11/18/2006  . PERICARDITIS 01/03/2007  . AORTIC STENOSIS/ INSUFFICIENCY, NON-RHEUMATIC 12/03/2008  . SINUSITIS- ACUTE-NOS 11/20/2007  . SINUSITIS, CHRONIC 02/05/2008  . ALLERGIC RHINITIS 11/18/2006  . PNEUMONIA 05/02/2007  . C O P D 06/27/2008  . Stricture and stenosis of esophagus 11/14/2008  . GERD 01/03/2007  . BARRETTS ESOPHAGUS 11/14/2008  . DYSPEPSIA 03/23/2007  . PRURITUS 10/08/2008  . OSTEOARTHRITIS 11/18/2006  . OSTEOARTHRITIS, KNEE, LEFT 01/03/2007  . Cervicalgia 01/13/2009  . BACK PAIN 02/04/2009    is improved  . BURSITIS, LEFT HIP 04/18/2009  . OSTEOPOROSIS 11/18/2006  . INSOMNIA-SLEEP DISORDER-UNSPEC 06/03/2008  . FATIGUE 04/18/2009  . PERIPHERAL EDEMA 02/13/2009  . MURMUR 11/18/2006  . DYSPHAGIA UNSPECIFIED 03/23/2007  . Abdominal pain, generalized 03/01/2007  . Nonspecific (abnormal) findings on radiological and other examination of body structure 06/03/2008  . Tuberculin Test Reaction 11/18/2006  . Acute bronchitis 02/23/2010  . CHOLELITHIASIS 04/24/2010  . NEPHROLITHIASIS, HX OF 04/24/2010  .  Polyarthralgia 06/09/2010  . Elevated sed rate 06/11/2010  . Positive ANA (antinuclear antibody) 06/11/2010  . Rheumatoid factor positive 06/11/2010  . Cancer     lung ca  . 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 SPONDYLOSIS, CERVICAL, WITH RADICULOPATHY 01/13/2009  . Cervical radiculopathy 09/28/2010  . Lumbar radiculopathy 09/28/2010  . RENAL INSUFFICIENCY 02/05/2008    function has improved.  . Transfusion history 09-01-12    4'14 tranfused x 2 units  . Bleeding nose 09-01-12    past hx. none recent after having surgery  . Wound healing, delayed 09-01-12    right abdominal area   Past Surgical History  Procedure Laterality Date  . Abdominal hysterectomy    . Tubal ligation    . Tumor removal  08/06/08    from lungs  . Anterior cervical decomp/discectomy fusion  01/15/2011    Procedure: ANTERIOR CERVICAL DECOMPRESSION/DISCECTOMY FUSION 2 LEVELS;  Surgeon: Cooper Render Pool;  Location: [facility];  Service: Neurosurgery;  Laterality: N/A;  Cervical Three-Four, Cervical Four-Five Anterior Cervical Decompression Fusion   . Colonscopy    . Nose surgery    . Laparoscopic appendectomy N/A 05/25/2012    Procedure: APPENDECTOMY LAPAROSCOPIC  CONVERTED TO  OPEN APPENDECTOMY, exploratory laparatomy;  Surgeon: Odis Hollingshead, MD;  Location: [facility];  Service: General;  Laterality: N/A;  . Bowel resection N/A 05/25/2012    Procedure: SMALL BOWEL RESECTION;  Surgeon: Odis Hollingshead, MD;  Location: [facility];  Service: General;  Laterality: N/A;  . Laparoscopic lysis of adhesions N/A 05/25/2012    Procedure: LAPAROSCOPIC LYSIS OF ADHESIONS;  Surgeon: Odis Hollingshead, MD;  Location: [facility];  Service: General;  Laterality: N/A;  . Appendectomy    . Cataract extraction, bilateral  09-01-12  . Debridement of abdominal wall abscess N/A 09/07/2012    Procedure:  ABDOMINAL WOUND IRRIGATION AND TWO LAYERED CLOSURE;  Surgeon: Odis Hollingshead, MD;  Location: [facility];  Service: General;  Laterality: N/A;     reports that she quit smoking about 8 years ago. Her smoking use included Cigarettes. She has a 60 pack-year smoking history. She has quit using smokeless tobacco. She reports that she does not drink alcohol or use illicit drugs. family history includes Cancer in her brother; Coronary artery disease in her other; Heart disease in her father; Hyperlipidemia in her other; Hypertension in her other. There is no history of Colon cancer, Esophageal cancer, Rectal cancer, or Stomach cancer. Allergies  Allergen Reactions  . Amoxicillin Itching  . Fentanyl Other (See Comments)    hallucinations  . Hydrocodone Other (See Comments)    ineffective  . Codeine Hives and Rash  . Penicillins Hives and Rash   Current Outpatient Prescriptions on File Prior to Visit  Medication Sig Dispense Refill  . acetaminophen (TYLENOL) 500 MG tablet Take 1,000 mg by mouth every 6 (six) hours as needed (pain).    Marland Kitchen aspirin 325 MG tablet Take 325 mg by mouth daily.    . Calcium Citrate-Vitamin D (CALCIUM + D PO) Take 1 tablet by mouth daily.    . citalopram (CELEXA) 10 MG tablet TAKE 1 TABLET (10 MG TOTAL) BY MOUTH EVERY MORNING. 90 tablet 2  . FIBER SELECT GUMMIES CHEW Chew 2 tablets by mouth daily.    . furosemide (LASIX) 40 MG tablet Take 1.5 tablets (60 mg total) by mouth 2 (two) times daily. 90 tablet 1  . metoprolol succinate (TOPROL-XL) 25 MG 24 hr tablet TAKE 1 TABLET (25 MG TOTAL) BY MOUTH EVERY MORNING. (Patient taking differently: Take 25 mg by mouth daily. ) 90 tablet 3  . pantoprazole (PROTONIX) 40 MG tablet TAKE 1 TABLET (40 MG TOTAL) BY MOUTH DAILY. 90 tablet 3  . potassium chloride SA (K-DUR,KLOR-CON) 20 MEQ tablet Take 1 tablet (20 mEq total) by mouth daily. 30 tablet 1  . pravastatin (PRAVACHOL) 40 MG tablet Take 1 tablet (40 mg total) by mouth daily. 90 tablet 3  . tamsulosin (FLOMAX) 0.4 MG CAPS capsule Take 0.4 mg by mouth daily with breakfast.     . traMADol (ULTRAM) 50 MG tablet Take 1 tablet (50 mg  total) by mouth every 8 (eight) hours as needed. (Patient taking differently: Take 50 mg by mouth every 8 (eight) hours as needed (pain). ) 60 tablet 2   No current facility-administered medications on file prior to visit.     Review of Systems  Constitutional: Negative for unusual diaphoresis or night sweats HENT: Negative for ringing in ear or discharge Eyes: Negative for double vision or worsening visual disturbance.  Respiratory: Negative for choking and stridor.   Gastrointestinal: Negative for vomiting or other signifcant bowel change Genitourinary: Negative for hematuria or change in urine volume.  Musculoskeletal: Negative for other MSK pain or swelling Skin: Negative for color change and worsening wound.  Neurological: Negative for tremors and numbness other than noted  Psychiatric/Behavioral: Negative for decreased concentration or agitation other than above       Objective:   Physical Exam BP 140/64 mmHg  Pulse 69  Temp(Src) 97.9 F (36.6 C) (Oral)  Ht '5\' 2"'$  (1.575  m)  Wt 164 lb (74.39 kg)  BMI 29.99 kg/m2  SpO2 97% VS noted,  Constitutional: Pt appears in no significant distress HENT: Head: NCAT.  Right Ear: External ear normal.  Left Ear: External ear normal.  Eyes: . Pupils are equal, round, and reactive to light. Conjunctivae and EOM are normal Neck: Normal range of motion. Neck supple.  Cardiovascular: Normal rate and regular rhythm.   Pulmonary/Chest: Effort normal and breath sounds without rales or wheezing.  Abd:  Soft, NT, ND, + BS Neurological: Pt is alert. Not confused , motor grossly intact Skin: Skin is warm. No rash, no LE edema today Psychiatric: Pt behavior is normal. No agitation.     Assessment & Plan:

## 2014-11-06 NOTE — Progress Notes (Signed)
Pre visit review using our clinic review tool, if applicable. No additional management support is needed unless otherwise documented below in the visit note. 

## 2014-11-06 NOTE — Assessment & Plan Note (Signed)
May be mildly elevated s/p holding amlod 5 per renal, ok for incresaed metoprolol ER to 50 qd (from 25), has f/u with [facility] staff tomorrow, then next Tuesday, also f/u with renal planned for late nov 2016.  Consider low dose hydralazine for further BP control if needed, avoid ace/arb due to renal

## 2014-11-08 ENCOUNTER — Other Ambulatory Visit: Payer: Self-pay | Admitting: Internal Medicine

## 2014-11-20 ENCOUNTER — Telehealth: Payer: Self-pay | Admitting: Internal Medicine

## 2014-11-20 NOTE — Telephone Encounter (Signed)
Moved 10/27 f/u to 11/15. Lab/ct remain 10/24. Spoke with patient she is aware. New schedule mailed.

## 2014-12-09 ENCOUNTER — Other Ambulatory Visit (HOSPITAL_BASED_OUTPATIENT_CLINIC_OR_DEPARTMENT_OTHER): Payer: Medicare HMO

## 2014-12-09 ENCOUNTER — Ambulatory Visit (HOSPITAL_COMMUNITY)
Admission: RE | Admit: 2014-12-09 | Discharge: 2014-12-09 | Disposition: A | Discharge status: Home / Self Care | Payer: Medicare HMO | Source: Ambulatory Visit | Attending: Internal Medicine | Admitting: Internal Medicine

## 2014-12-09 ENCOUNTER — Other Ambulatory Visit: Payer: Self-pay | Admitting: Internal Medicine

## 2014-12-09 ENCOUNTER — Encounter (HOSPITAL_COMMUNITY): Payer: Self-pay

## 2014-12-09 ENCOUNTER — Other Ambulatory Visit: Payer: Self-pay | Admitting: *Deleted

## 2014-12-09 DIAGNOSIS — C3492 Malignant neoplasm of unspecified part of left bronchus or lung: Secondary | ICD-10-CM

## 2014-12-09 DIAGNOSIS — N200 Calculus of kidney: Secondary | ICD-10-CM | POA: Diagnosis not present

## 2014-12-09 DIAGNOSIS — R918 Other nonspecific abnormal finding of lung field: Secondary | ICD-10-CM | POA: Diagnosis not present

## 2014-12-09 DIAGNOSIS — I700 Atherosclerosis of aorta: Secondary | ICD-10-CM | POA: Diagnosis not present

## 2014-12-09 DIAGNOSIS — Z85118 Personal history of other malignant neoplasm of bronchus and lung: Secondary | ICD-10-CM | POA: Diagnosis not present

## 2014-12-09 LAB — CBC WITH DIFFERENTIAL/PLATELET
BASO%: 1.2 % (ref 0.0–2.0)
BASOS ABS: 0 10*3/uL (ref 0.0–0.1)
EOS%: 3 % (ref 0.0–7.0)
Eosinophils Absolute: 0.1 10*3/uL (ref 0.0–0.5)
HEMATOCRIT: 30.5 % — AB (ref 34.8–46.6)
HEMOGLOBIN: 10 g/dL — AB (ref 11.6–15.9)
LYMPH#: 1 10*3/uL (ref 0.9–3.3)
LYMPH%: 24.6 % (ref 14.0–49.7)
MCH: 32.9 pg (ref 25.1–34.0)
MCHC: 32.7 g/dL (ref 31.5–36.0)
MCV: 100.8 fL (ref 79.5–101.0)
MONO#: 0.6 10*3/uL (ref 0.1–0.9)
MONO%: 14.1 % — ABNORMAL HIGH (ref 0.0–14.0)
NEUT#: 2.4 10*3/uL (ref 1.5–6.5)
NEUT%: 57.1 % (ref 38.4–76.8)
PLATELETS: 185 10*3/uL (ref 145–400)
RBC: 3.02 10*6/uL — ABNORMAL LOW (ref 3.70–5.45)
RDW: 18.1 % — AB (ref 11.2–14.5)
WBC: 4.2 10*3/uL (ref 3.9–10.3)

## 2014-12-09 LAB — COMPREHENSIVE METABOLIC PANEL (CC13)
ALT: 12 U/L (ref 0–55)
ANION GAP: 8 meq/L (ref 3–11)
AST: 8 U/L (ref 5–34)
Albumin: 3.4 g/dL — ABNORMAL LOW (ref 3.5–5.0)
Alkaline Phosphatase: 51 U/L (ref 40–150)
BUN: 50.2 mg/dL — ABNORMAL HIGH (ref 7.0–26.0)
CO2: 23 meq/L (ref 22–29)
Calcium: 9 mg/dL (ref 8.4–10.4)
Chloride: 108 mEq/L (ref 98–109)
Creatinine: 2.2 mg/dL — ABNORMAL HIGH (ref 0.6–1.1)
EGFR: 25 mL/min/{1.73_m2} — AB (ref >90)
Glucose: 102 mg/dl (ref 70–140)
POTASSIUM: 4.5 meq/L (ref 3.5–5.1)
Sodium: 139 mEq/L (ref 136–145)
TOTAL PROTEIN: 6.7 g/dL (ref 6.4–8.3)

## 2014-12-12 ENCOUNTER — Ambulatory Visit: Payer: Medicare HMO | Admitting: Family Medicine

## 2014-12-12 ENCOUNTER — Ambulatory Visit: Payer: Medicare HMO | Admitting: Internal Medicine

## 2014-12-16 ENCOUNTER — Other Ambulatory Visit: Payer: Self-pay | Admitting: Internal Medicine

## 2014-12-18 ENCOUNTER — Ambulatory Visit: Payer: Medicare HMO | Admitting: Family Medicine

## 2014-12-30 ENCOUNTER — Ambulatory Visit: Payer: Medicare HMO | Admitting: Family Medicine

## 2014-12-31 ENCOUNTER — Ambulatory Visit (HOSPITAL_BASED_OUTPATIENT_CLINIC_OR_DEPARTMENT_OTHER): Payer: Medicare HMO | Admitting: Internal Medicine

## 2014-12-31 ENCOUNTER — Encounter: Payer: Self-pay | Admitting: Internal Medicine

## 2014-12-31 ENCOUNTER — Emergency Department (HOSPITAL_COMMUNITY)
Admission: EM | Admit: 2014-12-31 | Discharge: 2015-01-01 | Disposition: A | Discharge status: Home / Self Care | Payer: Medicare HMO | Attending: Physician Assistant | Admitting: Physician Assistant

## 2014-12-31 ENCOUNTER — Ambulatory Visit (INDEPENDENT_AMBULATORY_CARE_PROVIDER_SITE_OTHER): Payer: Medicare HMO | Admitting: Internal Medicine

## 2014-12-31 ENCOUNTER — Encounter (HOSPITAL_COMMUNITY): Payer: Self-pay | Admitting: Emergency Medicine

## 2014-12-31 VITALS — BP 114/43 | HR 68 | Temp 98.4°F | Resp 18 | Ht 62.0 in | Wt 166.3 lb

## 2014-12-31 VITALS — BP 120/62 | HR 68 | Temp 98.1°F | Ht 62.0 in | Wt 166.0 lb

## 2014-12-31 DIAGNOSIS — Z87891 Personal history of nicotine dependence: Secondary | ICD-10-CM | POA: Insufficient documentation

## 2014-12-31 DIAGNOSIS — Z7982 Long term (current) use of aspirin: Secondary | ICD-10-CM | POA: Diagnosis not present

## 2014-12-31 DIAGNOSIS — E785 Hyperlipidemia, unspecified: Secondary | ICD-10-CM | POA: Insufficient documentation

## 2014-12-31 DIAGNOSIS — Z8701 Personal history of pneumonia (recurrent): Secondary | ICD-10-CM | POA: Diagnosis not present

## 2014-12-31 DIAGNOSIS — M1990 Unspecified osteoarthritis, unspecified site: Secondary | ICD-10-CM | POA: Insufficient documentation

## 2014-12-31 DIAGNOSIS — I10 Essential (primary) hypertension: Secondary | ICD-10-CM | POA: Diagnosis not present

## 2014-12-31 DIAGNOSIS — K219 Gastro-esophageal reflux disease without esophagitis: Secondary | ICD-10-CM | POA: Insufficient documentation

## 2014-12-31 DIAGNOSIS — F329 Major depressive disorder, single episode, unspecified: Secondary | ICD-10-CM | POA: Diagnosis not present

## 2014-12-31 DIAGNOSIS — Z87828 Personal history of other (healed) physical injury and trauma: Secondary | ICD-10-CM | POA: Diagnosis not present

## 2014-12-31 DIAGNOSIS — Z87448 Personal history of other diseases of urinary system: Secondary | ICD-10-CM | POA: Insufficient documentation

## 2014-12-31 DIAGNOSIS — M810 Age-related osteoporosis without current pathological fracture: Secondary | ICD-10-CM | POA: Insufficient documentation

## 2014-12-31 DIAGNOSIS — Z880 Allergy status to penicillin: Secondary | ICD-10-CM | POA: Insufficient documentation

## 2014-12-31 DIAGNOSIS — R0602 Shortness of breath: Secondary | ICD-10-CM

## 2014-12-31 DIAGNOSIS — F419 Anxiety disorder, unspecified: Secondary | ICD-10-CM | POA: Insufficient documentation

## 2014-12-31 DIAGNOSIS — Z872 Personal history of diseases of the skin and subcutaneous tissue: Secondary | ICD-10-CM | POA: Insufficient documentation

## 2014-12-31 DIAGNOSIS — M79609 Pain in unspecified limb: Secondary | ICD-10-CM | POA: Diagnosis not present

## 2014-12-31 DIAGNOSIS — M79605 Pain in left leg: Secondary | ICD-10-CM | POA: Insufficient documentation

## 2014-12-31 DIAGNOSIS — Z87442 Personal history of urinary calculi: Secondary | ICD-10-CM | POA: Diagnosis not present

## 2014-12-31 DIAGNOSIS — Z85118 Personal history of other malignant neoplasm of bronchus and lung: Secondary | ICD-10-CM

## 2014-12-31 DIAGNOSIS — Z79899 Other long term (current) drug therapy: Secondary | ICD-10-CM | POA: Diagnosis not present

## 2014-12-31 DIAGNOSIS — J849 Interstitial pulmonary disease, unspecified: Secondary | ICD-10-CM

## 2014-12-31 DIAGNOSIS — Z862 Personal history of diseases of the blood and blood-forming organs and certain disorders involving the immune mechanism: Secondary | ICD-10-CM | POA: Diagnosis not present

## 2014-12-31 DIAGNOSIS — J441 Chronic obstructive pulmonary disease with (acute) exacerbation: Secondary | ICD-10-CM | POA: Diagnosis not present

## 2014-12-31 DIAGNOSIS — R011 Cardiac murmur, unspecified: Secondary | ICD-10-CM | POA: Diagnosis not present

## 2014-12-31 DIAGNOSIS — R2242 Localized swelling, mass and lump, left lower limb: Secondary | ICD-10-CM | POA: Diagnosis not present

## 2014-12-31 DIAGNOSIS — C3492 Malignant neoplasm of unspecified part of left bronchus or lung: Secondary | ICD-10-CM

## 2014-12-31 LAB — COMPREHENSIVE METABOLIC PANEL
ALT: 14 U/L (ref 14–54)
AST: 11 U/L — AB (ref 15–41)
Albumin: 3.8 g/dL (ref 3.5–5.0)
Alkaline Phosphatase: 50 U/L (ref 38–126)
Anion gap: 7 (ref 5–15)
BILIRUBIN TOTAL: 0.3 mg/dL (ref 0.3–1.2)
BUN: 56 mg/dL — AB (ref 6–20)
CO2: 26 mmol/L (ref 22–32)
CREATININE: 2.24 mg/dL — AB (ref 0.44–1.00)
Calcium: 9.3 mg/dL (ref 8.9–10.3)
Chloride: 107 mmol/L (ref 101–111)
GFR, EST AFRICAN AMERICAN: 24 mL/min — AB (ref >60)
GFR, EST NON AFRICAN AMERICAN: 20 mL/min — AB (ref >60)
Glucose, Bld: 101 mg/dL — ABNORMAL HIGH (ref 65–99)
Potassium: 4.4 mmol/L (ref 3.5–5.1)
Sodium: 140 mmol/L (ref 135–145)
TOTAL PROTEIN: 7 g/dL (ref 6.5–8.1)

## 2014-12-31 LAB — CBC WITH DIFFERENTIAL/PLATELET
BASOS ABS: 0 10*3/uL (ref 0.0–0.1)
Basophils Relative: 1 %
EOS PCT: 3 %
Eosinophils Absolute: 0.1 10*3/uL (ref 0.0–0.7)
HEMATOCRIT: 32.8 % — AB (ref 36.0–46.0)
Hemoglobin: 10.4 g/dL — ABNORMAL LOW (ref 12.0–15.0)
LYMPHS ABS: 1.5 10*3/uL (ref 0.7–4.0)
LYMPHS PCT: 35 %
MCH: 32.6 pg (ref 26.0–34.0)
MCHC: 31.7 g/dL (ref 30.0–36.0)
MCV: 102.8 fL — AB (ref 78.0–100.0)
MONO ABS: 0.5 10*3/uL (ref 0.1–1.0)
Monocytes Relative: 13 %
NEUTROS ABS: 2.2 10*3/uL (ref 1.7–7.7)
Neutrophils Relative %: 50 %
Platelets: 219 10*3/uL (ref 150–400)
RBC: 3.19 MIL/uL — AB (ref 3.87–5.11)
RDW: 15 % (ref 11.5–15.5)
WBC: 4.3 10*3/uL (ref 4.0–10.5)

## 2014-12-31 MED ORDER — ENOXAPARIN SODIUM 80 MG/0.8ML ~~LOC~~ SOLN
70.0000 mg | Freq: Once | SUBCUTANEOUS | Status: AC
  Administered 2014-12-31: 70 mg via SUBCUTANEOUS
  Filled 2014-12-31: qty 0.8

## 2014-12-31 NOTE — ED Notes (Addendum)
Pt c/o L lower leg swelling and pain. Pt saw PCP today and was sent for rule out blood clot. No redness noted. No significant swelling noted. Negative homan's sign. Pain increases with ambulation. Pedal pulse strong. A&Ox4. No hx of blood clots. Pt on 2L O2 at home. Denies abnormal SOB. Denies chest pain.

## 2014-12-31 NOTE — Progress Notes (Signed)
Subjective:    Patient ID: [name], female    DOB: 03/14/1940, 74 y.o.   MRN: [id number]  HPI  Here with c/o sudden onset 2 days worsening LLE swelling and pain out of proportion to hx of swelling related to pulm HTN and venous insufficiency it seems.  No falls or trauma, no long car rides.  Hx of lung cancer, chronic resp failure on home o2. No prior hx of DVT.  Saw Oncology earlier today, with no evidence for recurrence.  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 No fever, cough and Pt denies chest pain, increased sob or doe, wheezing, orthopnea, PND, palpitations, dizziness or syncope. Past Medical History  Diagnosis Date  . HYPERLIPIDEMIA 11/18/2006  . HYPERKALEMIA 02/05/2008  . ANEMIA-IRON DEFICIENCY 09/22/2008  . Anemia of other chronic disease 02/05/2008  . ANXIETY 06/03/2008  . DEPRESSION 04/28/2009  . HYPERTENSION 11/18/2006  . PERICARDITIS 01/03/2007  . AORTIC STENOSIS/ INSUFFICIENCY, NON-RHEUMATIC 12/03/2008  . SINUSITIS- ACUTE-NOS 11/20/2007  . SINUSITIS, CHRONIC 02/05/2008  . ALLERGIC RHINITIS 11/18/2006  . PNEUMONIA 05/02/2007  . C O P D 06/27/2008  . Stricture and stenosis of esophagus 11/14/2008  . GERD 01/03/2007  . BARRETTS ESOPHAGUS 11/14/2008  . DYSPEPSIA 03/23/2007  . PRURITUS 10/08/2008  . OSTEOARTHRITIS 11/18/2006  . OSTEOARTHRITIS, KNEE, LEFT 01/03/2007  . Cervicalgia 01/13/2009  . BACK PAIN 02/04/2009    is improved  . BURSITIS, LEFT HIP 04/18/2009  . OSTEOPOROSIS 11/18/2006  . INSOMNIA-SLEEP DISORDER-UNSPEC 06/03/2008  . FATIGUE 04/18/2009  . PERIPHERAL EDEMA 02/13/2009  . MURMUR 11/18/2006  . DYSPHAGIA UNSPECIFIED 03/23/2007  . Abdominal pain, generalized 03/01/2007  . Nonspecific (abnormal) findings on radiological and other examination of body structure 06/03/2008  . Tuberculin Test Reaction 11/18/2006  . Acute bronchitis 02/23/2010  . CHOLELITHIASIS 04/24/2010  . NEPHROLITHIASIS, HX OF 04/24/2010  . Polyarthralgia 06/09/2010  .  Elevated sed rate 06/11/2010  . Positive ANA (antinuclear antibody) 06/11/2010  . Rheumatoid factor positive 06/11/2010  . Cancer ([place])     lung ca  . 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 SPONDYLOSIS, CERVICAL, WITH RADICULOPATHY 01/13/2009  . Cervical radiculopathy 09/28/2010  . Lumbar radiculopathy 09/28/2010  . RENAL INSUFFICIENCY 02/05/2008    function has improved.  . Transfusion history 09-01-12    4'14 tranfused x 2 units  . Bleeding nose 09-01-12    past hx. none recent after having surgery  . Wound healing, delayed 09-01-12    right abdominal area   Past Surgical History  Procedure Laterality Date  . Abdominal hysterectomy    . Tubal ligation    . Tumor removal  08/06/08    from lungs  . Anterior cervical decomp/discectomy fusion  01/15/2011    Procedure: ANTERIOR CERVICAL DECOMPRESSION/DISCECTOMY FUSION 2 LEVELS;  Surgeon: Cooper Render Pool;  Location: [facility];  Service: Neurosurgery;  Laterality: N/A;  Cervical Three-Four, Cervical Four-Five Anterior Cervical Decompression Fusion   . Colonscopy    . Nose surgery    . Laparoscopic appendectomy N/A 05/25/2012    Procedure: APPENDECTOMY LAPAROSCOPIC  CONVERTED TO  OPEN APPENDECTOMY, exploratory laparatomy;  Surgeon: Odis Hollingshead, MD;  Location: [facility];  Service: General;  Laterality: N/A;  . Bowel resection N/A 05/25/2012    Procedure: SMALL BOWEL RESECTION;  Surgeon: Odis Hollingshead, MD;  Location: [facility];  Service: General;  Laterality: N/A;  . Laparoscopic lysis of adhesions N/A 05/25/2012    Procedure: LAPAROSCOPIC LYSIS OF ADHESIONS;  Surgeon: Sherren Mocha  Adalberto Cole, MD;  Location: [facility];  Service: General;  Laterality: N/A;  . Appendectomy    . Cataract extraction, bilateral  09-01-12  . Debridement of abdominal wall abscess N/A 09/07/2012    Procedure:  ABDOMINAL WOUND IRRIGATION AND TWO LAYERED CLOSURE;  Surgeon: Odis Hollingshead, MD;  Location: [facility];  Service: General;  Laterality: N/A;    reports that she quit  smoking about 8 years ago. Her smoking use included Cigarettes. She has a 60 pack-year smoking history. She has quit using smokeless tobacco. She reports that she does not drink alcohol or use illicit drugs. family history includes Cancer in her brother; Coronary artery disease in her other; Heart disease in her father; Hyperlipidemia in her other; Hypertension in her other. There is no history of Colon cancer, Esophageal cancer, Rectal cancer, or Stomach cancer. Allergies  Allergen Reactions  . Amoxicillin Itching  . Fentanyl Other (See Comments)    hallucinations  . Hydrocodone Other (See Comments)    ineffective  . Codeine Hives and Rash  . Penicillins Hives and Rash   Current Outpatient Prescriptions on File Prior to Visit  Medication Sig Dispense Refill  . acetaminophen (TYLENOL) 500 MG tablet Take 1,000 mg by mouth every 6 (six) hours as needed (pain).    Marland Kitchen alendronate (FOSAMAX) 70 MG tablet Take 1 tablet (70 mg total) by mouth every 7 (seven) days. Take with a full glass of water on an empty stomach. Patient takes on Mondays. 4 tablet 11  . aspirin 325 MG tablet Take 325 mg by mouth daily.    . Calcium Citrate-Vitamin D (CALCIUM + D PO) Take 1 tablet by mouth daily.    . citalopram (CELEXA) 10 MG tablet TAKE 1 TABLET (10 MG TOTAL) BY MOUTH EVERY MORNING. 90 tablet 2  . FIBER SELECT GUMMIES CHEW Chew 2 tablets by mouth daily.    . furosemide (LASIX) 40 MG tablet Take 1.5 tablets (60 mg total) by mouth 2 (two) times daily. 90 tablet 1  . KLOR-CON M20 20 MEQ tablet TAKE 1 TABLET BY MOUTH DAILY 90 tablet 3  . metoprolol succinate (TOPROL-XL) 50 MG 24 hr tablet Take 1 tablet (50 mg total) by mouth daily. Take with or immediately following a meal. 90 tablet 3  . pantoprazole (PROTONIX) 40 MG tablet TAKE 1 TABLET (40 MG TOTAL) BY MOUTH DAILY. 90 tablet 3  . pravastatin (PRAVACHOL) 40 MG tablet TAKE 1 TABLET BY MOUTH EVERY DAY 90 tablet 3  . tamsulosin (FLOMAX) 0.4 MG CAPS capsule Take 0.4 mg  by mouth daily with breakfast.     . tamsulosin (FLOMAX) 0.4 MG CAPS capsule TAKE ONE CAPSULE BY MOUTH EVERY DAY 30 capsule 5  . traMADol (ULTRAM) 50 MG tablet Take 1 tablet (50 mg total) by mouth every 8 (eight) hours as needed. (Patient taking differently: Take 50 mg by mouth every 8 (eight) hours as needed (pain). ) 60 tablet 2   No current facility-administered medications on file prior to visit.   Review of Systems  Constitutional: Negative for unusual diaphoresis or night sweats HENT: Negative for ringing in ear or discharge Eyes: Negative for double vision or worsening visual disturbance.  Respiratory: Negative for choking and stridor.   Gastrointestinal: Negative for vomiting or other signifcant bowel change Genitourinary: Negative for hematuria or change in urine volume.  Musculoskeletal: Negative for other MSK pain or swelling Skin: Negative for color change and worsening wound.  Neurological: Negative for tremors and numbness other than noted  Psychiatric/Behavioral: Negative for decreased concentration or agitation other than above       Objective:   Physical Exam BP 120/62 mmHg  Pulse 68  Temp(Src) 98.1 F (36.7 C) (Oral)  Ht '5\' 2"'$  (1.575 m)  Wt 166 lb (75.297 kg)  BMI 30.35 kg/m2  SpO2 97% VS noted, on port O2 Constitutional: Pt appears in no significant distress HENT: Head: NCAT.  Right Ear: External ear normal.  Left Ear: External ear normal.  Eyes: . Pupils are equal, round, and reactive to light. Conjunctivae and EOM are normal Neck: Normal range of motion. Neck supple.  Cardiovascular: Normal rate and regular rhythm.   Pulmonary/Chest: Effort normal and breath sounds decresaed without rales or wheezing.  Knees bilat - No effusions or tender RLE with trace edema LLE with 2+ diffuse edema to knee or slightly above, tender left calf, without overlying skin change, ulcer or erythema Neurological: Pt is alert. Not confused , motor grossly intact Skin: Skin is  warm. No rash, no LE edema Psychiatric: Pt behavior is normal. No agitation.     Assessment & Plan:

## 2014-12-31 NOTE — Assessment & Plan Note (Signed)
stable overall by history and exam, recent data reviewed with pt, and pt to continue medical treatment as before,  to f/u any worsening symptoms or concerns BP Readings from Last 3 Encounters:  12/31/14 120/62  12/31/14 114/43  11/06/14 140/64

## 2014-12-31 NOTE — ED Provider Notes (Signed)
CSN: [id number]     Arrival date & time 12/31/14  1835 History   First MD Initiated Contact with Patient 12/31/14 2006     Chief Complaint  Patient presents with  . Leg Swelling      (Consider location/radiation/quality/duration/timing/severity/associated sxs/prior Treatment) Patient is a 74 y.o. female presenting with leg pain. The history is provided by the patient. No language interpreter was used.  Leg Pain Location:  Leg Time since incident:  2 days Injury: no   Leg location:  L leg Pain details:    Quality:  Aching   Radiates to:  Does not radiate   Severity:  Mild   Onset quality:  Gradual   Timing:  Constant   Progression:  Worsening Chronicity:  New Dislocation: no   Foreign body present:  No foreign bodies Prior injury to area:  No Worsened by:  Nothing tried Ineffective treatments:  None tried Associated symptoms: no decreased ROM   Pt reports she saw her MD for pain in her left leg today.   Pt reports Dr. Jenny Reichmann sent her here to have a doppler of her leg to rule out blood clot.    Past Medical History  Diagnosis Date  . HYPERLIPIDEMIA 11/18/2006  . HYPERKALEMIA 02/05/2008  . ANEMIA-IRON DEFICIENCY 09/22/2008  . Anemia of other chronic disease 02/05/2008  . ANXIETY 06/03/2008  . DEPRESSION 04/28/2009  . HYPERTENSION 11/18/2006  . PERICARDITIS 01/03/2007  . AORTIC STENOSIS/ INSUFFICIENCY, NON-RHEUMATIC 12/03/2008  . SINUSITIS- ACUTE-NOS 11/20/2007  . SINUSITIS, CHRONIC 02/05/2008  . ALLERGIC RHINITIS 11/18/2006  . PNEUMONIA 05/02/2007  . C O P D 06/27/2008  . Stricture and stenosis of esophagus 11/14/2008  . GERD 01/03/2007  . BARRETTS ESOPHAGUS 11/14/2008  . DYSPEPSIA 03/23/2007  . PRURITUS 10/08/2008  . OSTEOARTHRITIS 11/18/2006  . OSTEOARTHRITIS, KNEE, LEFT 01/03/2007  . Cervicalgia 01/13/2009  . BACK PAIN 02/04/2009    is improved  . BURSITIS, LEFT HIP 04/18/2009  . OSTEOPOROSIS 11/18/2006  . INSOMNIA-SLEEP DISORDER-UNSPEC 06/03/2008  . FATIGUE 04/18/2009  .  PERIPHERAL EDEMA 02/13/2009  . MURMUR 11/18/2006  . DYSPHAGIA UNSPECIFIED 03/23/2007  . Abdominal pain, generalized 03/01/2007  . Nonspecific (abnormal) findings on radiological and other examination of body structure 06/03/2008  . Tuberculin Test Reaction 11/18/2006  . Acute bronchitis 02/23/2010  . CHOLELITHIASIS 04/24/2010  . NEPHROLITHIASIS, HX OF 04/24/2010  . Polyarthralgia 06/09/2010  . Elevated sed rate 06/11/2010  . Positive ANA (antinuclear antibody) 06/11/2010  . Rheumatoid factor positive 06/11/2010  . Cancer ([place])     lung ca  . 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 SPONDYLOSIS, CERVICAL, WITH RADICULOPATHY 01/13/2009  . Cervical radiculopathy 09/28/2010  . Lumbar radiculopathy 09/28/2010  . RENAL INSUFFICIENCY 02/05/2008    function has improved.  . Transfusion history 09-01-12    4'14 tranfused x 2 units  . Bleeding nose 09-01-12    past hx. none recent after having surgery  . Wound healing, delayed 09-01-12    right abdominal area   Past Surgical History  Procedure Laterality Date  . Abdominal hysterectomy    . Tubal ligation    . Tumor removal  08/06/08    from lungs  . Anterior cervical decomp/discectomy fusion  01/15/2011    Procedure: ANTERIOR CERVICAL DECOMPRESSION/DISCECTOMY FUSION 2 LEVELS;  Surgeon: Cooper Render Pool;  Location: [facility];  Service: Neurosurgery;  Laterality: N/A;  Cervical Three-Four, Cervical Four-Five Anterior Cervical Decompression Fusion   . Colonscopy    . Nose surgery    . Laparoscopic appendectomy  N/A 05/25/2012    Procedure: APPENDECTOMY LAPAROSCOPIC  CONVERTED TO  OPEN APPENDECTOMY, exploratory laparatomy;  Surgeon: Odis Hollingshead, MD;  Location: [facility];  Service: General;  Laterality: N/A;  . Bowel resection N/A 05/25/2012    Procedure: SMALL BOWEL RESECTION;  Surgeon: Odis Hollingshead, MD;  Location: [facility];  Service: General;  Laterality: N/A;  . Laparoscopic lysis of adhesions N/A 05/25/2012    Procedure: LAPAROSCOPIC LYSIS OF ADHESIONS;   Surgeon: Odis Hollingshead, MD;  Location: [facility];  Service: General;  Laterality: N/A;  . Appendectomy    . Cataract extraction, bilateral  09-01-12  . Debridement of abdominal wall abscess N/A 09/07/2012    Procedure:  ABDOMINAL WOUND IRRIGATION AND TWO LAYERED CLOSURE;  Surgeon: Odis Hollingshead, MD;  Location: [facility];  Service: General;  Laterality: N/A;   Family History  Problem Relation Age of Onset  . Hyperlipidemia Other   . Hypertension Other   . Coronary artery disease Other     several nephrew  . Heart disease Father   . Colon cancer Neg Hx   . Esophageal cancer Neg Hx   . Rectal cancer Neg Hx   . Stomach cancer Neg Hx   . Cancer Brother     ?stomach   Social History  Substance Use Topics  . Smoking status: Former Smoker -- 1.50 packs/day for 40 years    Types: Cigarettes    Quit date: 02/15/2006  . Smokeless tobacco: Former Systems developer  . Alcohol Use: No   OB History    No data available     Review of Systems  Musculoskeletal: Negative for joint swelling.  All other systems reviewed and are negative.     Allergies  Amoxicillin; Fentanyl; Hydrocodone; Lactose intolerance (gi); Codeine; and Penicillins  Home Medications   Prior to Admission medications   Medication Sig Start Date End Date Taking? Authorizing Provider  acetaminophen (TYLENOL) 500 MG tablet Take 1,000 mg by mouth every 6 (six) hours as needed (pain).   Yes Historical Provider, MD  alendronate (FOSAMAX) 70 MG tablet Take 1 tablet (70 mg total) by mouth every 7 (seven) days. Take with a full glass of water on an empty stomach. Patient takes on Mondays. 11/06/14  Yes Biagio Borg, MD  aspirin 325 MG tablet Take 325 mg by mouth daily.   Yes Historical Provider, MD  Calcium Citrate-Vitamin D (CALCIUM + D PO) Take 1 tablet by mouth daily.   Yes Historical Provider, MD  citalopram (CELEXA) 10 MG tablet TAKE 1 TABLET (10 MG TOTAL) BY MOUTH EVERY MORNING. 08/14/14  Yes Biagio Borg, MD  FIBER SELECT GUMMIES  CHEW Chew 2 tablets by mouth every other day.    Yes Historical Provider, MD  furosemide (LASIX) 40 MG tablet Take 1.5 tablets (60 mg total) by mouth 2 (two) times daily. 09/14/14  Yes Barton Dubois, MD  KLOR-CON M20 20 MEQ tablet TAKE 1 TABLET BY MOUTH DAILY 11/11/14  Yes Biagio Borg, MD  metoprolol succinate (TOPROL-XL) 50 MG 24 hr tablet Take 1 tablet (50 mg total) by mouth daily. Take with or immediately following a meal. 11/06/14  Yes Biagio Borg, MD  pantoprazole (PROTONIX) 40 MG tablet TAKE 1 TABLET (40 MG TOTAL) BY MOUTH DAILY. 08/14/14  Yes Biagio Borg, MD  pravastatin (PRAVACHOL) 40 MG tablet TAKE 1 TABLET BY MOUTH EVERY DAY 12/09/14  Yes Biagio Borg, MD  tamsulosin (FLOMAX) 0.4 MG CAPS capsule TAKE ONE CAPSULE BY MOUTH  EVERY DAY 12/16/14  Yes Biagio Borg, MD  traMADol (ULTRAM) 50 MG tablet Take 1 tablet (50 mg total) by mouth every 8 (eight) hours as needed. Patient taking differently: Take 50 mg by mouth every 8 (eight) hours as needed (pain).  08/13/14  Yes Biagio Borg, MD   BP 150/72 mmHg  Pulse 80  Temp(Src) 98.3 F (36.8 C) (Oral)  Resp 20  SpO2 98% Physical Exam  Constitutional: She is oriented to person, place, and time. She appears well-developed and well-nourished.  HENT:  Head: Normocephalic.  Eyes: EOM are normal.  Neck: Normal range of motion.  Pulmonary/Chest: Effort normal.  Abdominal: She exhibits no distension.  Musculoskeletal: She exhibits tenderness.  No gross swelling to leg,  Tender anterior shin, no edema,  homan's positive.  nv and ns intact  Neurological: She is alert and oriented to person, place, and time.  Psychiatric: She has a normal mood and affect.  Nursing note and vitals reviewed.   ED Course  Procedures (including critical care time) Labs Review Labs Reviewed  CBC WITH DIFFERENTIAL/PLATELET - Abnormal; Notable for the following:    RBC 3.19 (*)    Hemoglobin 10.4 (*)    HCT 32.8 (*)    MCV 102.8 (*)    All other components within  normal limits  COMPREHENSIVE METABOLIC PANEL - Abnormal; Notable for the following:    Glucose, Bld 101 (*)    BUN 56 (*)    Creatinine, Ser 2.24 (*)    AST 11 (*)    GFR calc non Af Amer 20 (*)    GFR calc Af Amer 24 (*)    All other components within normal limits    Imaging Review No results found. I have personally reviewed and evaluated these images and lab results as part of my medical decision-making.   EKG Interpretation None      MDM   Final diagnoses:  Leg pain, left    Pt given lovenox.  Pt scheduled to return for doppler study    Fransico Meadow, PA-C 12/31/14 [place], MD 01/07/15 [phone]

## 2014-12-31 NOTE — Progress Notes (Signed)
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  PRIOR THERAPY:Left upper lobectomy with node dissection and bronchoplasty and intercostal muscle flap under the care of Dr. Arlyce Dice on 08/06/2008.  CURRENT THERAPY: Observation.  INTERVAL HISTORY: [name] 74 y.o. female returns to the clinic today for 6 month followup visit accompanied by her son. The patient has no complaints today except for shortness breath with exertion and she is currently on home oxygen. She denied having any significant chest pain, cough or hemoptysis. She denied having any significant weight loss or night sweats. She also has mild swelling of the lower extremities but she is scheduled to see her primary care physician Dr. Jenny Reichmann later today. The patient has repeat CT scan of the chest performed recently and she is here today for evaluation and discussion of her scan results.  MEDICAL HISTORY: Past Medical History  Diagnosis Date  . HYPERLIPIDEMIA 11/18/2006  . HYPERKALEMIA 02/05/2008  . ANEMIA-IRON DEFICIENCY 09/22/2008  . Anemia of other chronic disease 02/05/2008  . ANXIETY 06/03/2008  . DEPRESSION 04/28/2009  . HYPERTENSION 11/18/2006  . PERICARDITIS 01/03/2007  . AORTIC STENOSIS/ INSUFFICIENCY, NON-RHEUMATIC 12/03/2008  . SINUSITIS- ACUTE-NOS 11/20/2007  . SINUSITIS, CHRONIC 02/05/2008  . ALLERGIC RHINITIS 11/18/2006  . PNEUMONIA 05/02/2007  . C O P D 06/27/2008  . Stricture and stenosis of esophagus 11/14/2008  . GERD 01/03/2007  . BARRETTS ESOPHAGUS 11/14/2008  . DYSPEPSIA 03/23/2007  . PRURITUS 10/08/2008  . OSTEOARTHRITIS 11/18/2006  . OSTEOARTHRITIS, KNEE, LEFT 01/03/2007  . Cervicalgia 01/13/2009  . BACK PAIN 02/04/2009    is improved  .  BURSITIS, LEFT HIP 04/18/2009  . OSTEOPOROSIS 11/18/2006  . INSOMNIA-SLEEP DISORDER-UNSPEC 06/03/2008  . FATIGUE 04/18/2009  . PERIPHERAL EDEMA 02/13/2009  . MURMUR 11/18/2006  . DYSPHAGIA UNSPECIFIED 03/23/2007  . Abdominal pain, generalized 03/01/2007  . Nonspecific (abnormal) findings on radiological and other examination of body structure 06/03/2008  . Tuberculin Test Reaction 11/18/2006  . Acute bronchitis 02/23/2010  . CHOLELITHIASIS 04/24/2010  . NEPHROLITHIASIS, HX OF 04/24/2010  . Polyarthralgia 06/09/2010  . Elevated sed rate 06/11/2010  . Positive ANA (antinuclear antibody) 06/11/2010  . Rheumatoid factor positive 06/11/2010  . Cancer ([place])     lung ca  . 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 SPONDYLOSIS, CERVICAL, WITH RADICULOPATHY 01/13/2009  . Cervical radiculopathy 09/28/2010  . Lumbar radiculopathy 09/28/2010  . RENAL INSUFFICIENCY 02/05/2008    function has improved.  . Transfusion history 09-01-12    4'14 tranfused x 2 units  . Bleeding nose 09-01-12    past hx. none recent after having surgery  . Wound healing, delayed 09-01-12    right abdominal area    ALLERGIES:  is allergic to amoxicillin; fentanyl; hydrocodone; codeine; and penicillins.  MEDICATIONS:  Current Outpatient Prescriptions  Medication Sig Dispense Refill  . acetaminophen (TYLENOL) 500 MG tablet Take 1,000 mg by mouth every 6 (six) hours as needed (pain).    Marland Kitchen alendronate (FOSAMAX) 70 MG tablet Take 1 tablet (70 mg total) by mouth every 7 (seven) days. Take with a full glass of water on an empty stomach. Patient takes on Mondays. 4 tablet 11  . aspirin 325 MG tablet Take 325 mg by mouth daily.    . Calcium  Citrate-Vitamin D (CALCIUM + D PO) Take 1 tablet by mouth daily.    . citalopram (CELEXA) 10 MG tablet TAKE 1 TABLET (10 MG TOTAL) BY MOUTH EVERY MORNING. 90 tablet 2  . FIBER SELECT GUMMIES CHEW Chew 2 tablets by mouth daily.    . furosemide (LASIX) 40 MG tablet Take 1.5 tablets (60 mg total) by mouth 2  (two) times daily. 90 tablet 1  . KLOR-CON M20 20 MEQ tablet TAKE 1 TABLET BY MOUTH DAILY 90 tablet 3  . metoprolol succinate (TOPROL-XL) 50 MG 24 hr tablet Take 1 tablet (50 mg total) by mouth daily. Take with or immediately following a meal. 90 tablet 3  . pantoprazole (PROTONIX) 40 MG tablet TAKE 1 TABLET (40 MG TOTAL) BY MOUTH DAILY. 90 tablet 3  . pravastatin (PRAVACHOL) 40 MG tablet TAKE 1 TABLET BY MOUTH EVERY DAY 90 tablet 3  . tamsulosin (FLOMAX) 0.4 MG CAPS capsule Take 0.4 mg by mouth daily with breakfast.     . tamsulosin (FLOMAX) 0.4 MG CAPS capsule TAKE ONE CAPSULE BY MOUTH EVERY DAY 30 capsule 5  . traMADol (ULTRAM) 50 MG tablet Take 1 tablet (50 mg total) by mouth every 8 (eight) hours as needed. (Patient taking differently: Take 50 mg by mouth every 8 (eight) hours as needed (pain). ) 60 tablet 2   No current facility-administered medications for this visit.    SURGICAL HISTORY:  Past Surgical History  Procedure Laterality Date  . Abdominal hysterectomy    . Tubal ligation    . Tumor removal  08/06/08    from lungs  . Anterior cervical decomp/discectomy fusion  01/15/2011    Procedure: ANTERIOR CERVICAL DECOMPRESSION/DISCECTOMY FUSION 2 LEVELS;  Surgeon: Cooper Render Pool;  Location: [facility];  Service: Neurosurgery;  Laterality: N/A;  Cervical Three-Four, Cervical Four-Five Anterior Cervical Decompression Fusion   . Colonscopy    . Nose surgery    . Laparoscopic appendectomy N/A 05/25/2012    Procedure: APPENDECTOMY LAPAROSCOPIC  CONVERTED TO  OPEN APPENDECTOMY, exploratory laparatomy;  Surgeon: Odis Hollingshead, MD;  Location: [facility];  Service: General;  Laterality: N/A;  . Bowel resection N/A 05/25/2012    Procedure: SMALL BOWEL RESECTION;  Surgeon: Odis Hollingshead, MD;  Location: [facility];  Service: General;  Laterality: N/A;  . Laparoscopic lysis of adhesions N/A 05/25/2012    Procedure: LAPAROSCOPIC LYSIS OF ADHESIONS;  Surgeon: Odis Hollingshead, MD;  Location: [facility];   Service: General;  Laterality: N/A;  . Appendectomy    . Cataract extraction, bilateral  09-01-12  . Debridement of abdominal wall abscess N/A 09/07/2012    Procedure:  ABDOMINAL WOUND IRRIGATION AND TWO LAYERED CLOSURE;  Surgeon: Odis Hollingshead, MD;  Location: [facility];  Service: General;  Laterality: N/A;    REVIEW OF SYSTEMS:  A comprehensive review of systems was negative except for: Constitutional: positive for fatigue Respiratory: positive for dyspnea on exertion   PHYSICAL EXAMINATION: General appearance: alert, cooperative and no distress Head: Normocephalic, without obvious abnormality, atraumatic Neck: no adenopathy Lymph nodes: Cervical, supraclavicular, and axillary nodes normal. Resp: clear to auscultation bilaterally Back: symmetric, no curvature. ROM normal. No CVA tenderness. Cardio: regular rate and rhythm, S1, S2 normal, no murmur, click, rub or gallop GI: soft, non-tender; bowel sounds normal; no masses,  no organomegaly Extremities: extremities normal, atraumatic, no cyanosis or edema Neurologic: Alert and oriented X 3, normal strength and tone. Normal symmetric reflexes. Normal coordination and gait  ECOG PERFORMANCE STATUS: 1 -  Symptomatic but completely ambulatory  There were no vitals taken for this visit.  LABORATORY DATA: Lab Results  Component Value Date   WBC 4.2 12/09/2014   HGB 10.0* 12/09/2014   HCT 30.5* 12/09/2014   MCV 100.8 12/09/2014   PLT 185 12/09/2014      Chemistry      Component Value Date/Time   NA 139 12/09/2014 1032   NA 138 09/24/2014 1435   NA 147* 09/20/2011 0817   K 4.5 12/09/2014 1032   K 5.2* 09/24/2014 1435   K 4.2 09/20/2011 0817   CL 98 09/24/2014 1435   CL 100 09/20/2011 0817   CO2 23 12/09/2014 1032   CO2 32 09/24/2014 1435   CO2 29 09/20/2011 0817   BUN 50.2* 12/09/2014 1032   BUN 71* 09/24/2014 1435   BUN 20 09/20/2011 0817   CREATININE 2.2* 12/09/2014 1032   CREATININE 2.80* 09/24/2014 1435   CREATININE  1.6* 09/20/2011 0817      Component Value Date/Time   CALCIUM 9.0 12/09/2014 1032   CALCIUM 9.7 09/24/2014 1435   CALCIUM 9.1 09/20/2011 0817   ALKPHOS 51 12/09/2014 1032   ALKPHOS 54 09/12/2014 1615   ALKPHOS 69 09/20/2011 0817   AST 8 12/09/2014 1032   AST 16 09/12/2014 1615   AST 17 09/20/2011 0817   ALT 12 12/09/2014 1032   ALT 22 09/12/2014 1615   ALT 23 09/20/2011 0817   BILITOT <0.30 12/09/2014 1032   BILITOT 0.6 09/12/2014 1615   BILITOT 0.60 09/20/2011 0817       RADIOGRAPHIC STUDIES: Ct Chest Wo Contrast  12/09/2014  CLINICAL DATA:  Subsequent encounter for lung cancer. EXAM: CT CHEST WITHOUT CONTRAST TECHNIQUE: Multidetector CT imaging of the chest was performed following the standard protocol without IV contrast. COMPARISON:  06/04/2014. FINDINGS: Mediastinum / Lymph Nodes: There is no axillary lymphadenopathy. The ill-defined soft tissue attenuation in the anterior and prevascular mediastinum is stable in the interval. Small lymph nodes in the right paratracheal space an AP window are not substantially changed. AP window lymph node has an 8 mm short axis diameter. No subcarinal lymphadenopathy. No definite hilar lymphadenopathy on the CT scan without contrast. Lungs / Pleura: Continued interval progression of interstitial and patchy airspace disease in both lungs, right greater than left. Some regions have a bronchovascular distribution of disease. Volume loss in the left hemi thorax is again noted presumably from previous lung resection. MSK / Soft Tissues: Bone windows reveal no worrisome lytic or sclerotic osseous lesions. Upper Abdomen: Nonobstructing stones identified in the left kidney. Atherosclerotic calcification noted in the abdominal aorta. IMPRESSION: Continued interval slight progression of interstitial and alveolar disease in the lungs, right greater than left. Diffuse infection, including atypical agent, would be a consideration. Infectious/inflammatory  interstitial disease is a possibility and lymphangitic tumor spread is not entirely excluded. Electronically Signed   By: Misty Stanley M.D.   On: 12/09/2014 13:55   ASSESSMENT AND PLAN: This is a very pleasant 74 years old [place] American female with history of stage IA non-small cell lung cancer, squamous cell carcinoma, status post left upper lobectomy with lymph node dissection.  She has been observation for more than 6 years. The patient is doing fine with no specific complaints except for the baseline shortness of breath. The recent CT scan of the chest showed slow progression progression of by basilar opacities concerning for inflammatory process but cannot completely rule out malignancy. I discussed the scan results with the patient and her son.  I recommended for her to continue on observation with routine follow-up visit with her primary care physician. The patient may benefit from referral to pulmonary medicine for evaluation of the inflammatory process in the lungs. I would see her on as-needed basis at this point. The patient was advised to call me immediately if she has any concerning symptoms..  All questions were answered. The patient knows to call the clinic with any problems, questions or concerns. We can certainly see the patient much sooner if necessary.  Disclaimer: This note was dictated with voice recognition software. Similar sounding words can inadvertently be transcribed and may not be corrected upon review.

## 2014-12-31 NOTE — Discharge Instructions (Signed)
Enoxaparin injection What is this medicine? ENOXAPARIN (ee nox a PA rin) is used after knee, hip, or abdominal surgeries to prevent blood clotting. It is also used to treat existing blood clots in the lungs or in the veins. This medicine may be used for other purposes; ask your health care provider or pharmacist if you have questions. What should I tell my health care provider before I take this medicine? They need to know if you have any of these conditions: -bleeding disorders, hemorrhage, or hemophilia -infection of the heart or heart valves -kidney or liver disease -previous stroke -prosthetic heart valve -recent surgery or delivery of a baby -ulcer in the stomach or intestine, diverticulitis, or other bowel disease -an unusual or allergic reaction to enoxaparin, heparin, pork or pork products, other medicines, foods, dyes, or preservatives -pregnant or trying to get pregnant -breast-feeding How should I use this medicine? This medicine is for injection under the skin. It is usually given by a health-care professional. You or a family member may be trained on how to give the injections. If you are to give yourself injections, make sure you understand how to use the syringe, measure the dose if necessary, and give the injection. To avoid bruising, do not rub the site where this medicine has been injected. Do not take your medicine more often than directed. Do not stop taking except on the advice of your doctor or health care professional. Make sure you receive a puncture-resistant container to dispose of the needles and syringes once you have finished with them. Do not reuse these items. Return the container to your doctor or health care professional for proper disposal. Talk to your pediatrician regarding the use of this medicine in children. Special care may be needed. Overdosage: If you think you have taken too much of this medicine contact a poison control center or emergency room at  once. NOTE: This medicine is only for you. Do not share this medicine with others. What if I miss a dose? If you miss a dose, take it as soon as you can. If it is almost time for your next dose, take only that dose. Do not take double or extra doses. What may interact with this medicine? -aspirin and aspirin-like medicines -certain medicines that treat or prevent blood clots -dipyridamole -NSAIDs, medicines for pain and inflammation, like ibuprofen or naproxen This list may not describe all possible interactions. Give your health care provider a list of all the medicines, herbs, non-prescription drugs, or dietary supplements you use. Also tell them if you smoke, drink alcohol, or use illegal drugs. Some items may interact with your medicine. What should I watch for while using this medicine? Visit your doctor or health care professional for regular checks on your progress. Your condition will be monitored carefully while you are receiving this medicine. Notify your doctor or health care professional and seek emergency treatment if you develop breathing problems; changes in vision; chest pain; severe, sudden headache; pain, swelling, warmth in the leg; trouble speaking; sudden numbness or weakness of the face, arm, or leg. These can be signs that your condition has gotten worse. If you are going to have surgery, tell your doctor or health care professional that you are taking this medicine. Do not stop taking this medicine without first talking to your doctor. Be sure to refill your prescription before you run out of medicine. Avoid sports and activities that might cause injury while you are using this medicine. Severe falls or injuries can  cause unseen bleeding. Be careful when using sharp tools or knives. Consider using an Copy. Take special care brushing or flossing your teeth. Report any injuries, bruising, or red spots on the skin to your doctor or health care professional. What side  effects may I notice from receiving this medicine? Side effects that you should report to your doctor or health care professional as soon as possible: -allergic reactions like skin rash, itching or hives, swelling of the face, lips, or tongue -feeling faint or lightheaded, falls -signs and symptoms of bleeding such as bloody or black, tarry stools; red or dark-brown urine; spitting up blood or brown material that looks like coffee grounds; red spots on the skin; unusual bruising or bleeding from the eye, gums, or nose Side effects that usually do not require medical attention (report to your doctor or health care professional if they continue or are bothersome): -pain, redness, or irritation at site where injected This list may not describe all possible side effects. Call your doctor for medical advice about side effects. You may report side effects to FDA at 1-800-FDA-1088. Where should I keep my medicine? Keep out of the reach of children. Store at room temperature between 15 and 30 degrees C (59 and 86 degrees F). Do not freeze. If your injections have been specially prepared, you may need to store them in the refrigerator. Ask your pharmacist. Throw away any unused medicine after the expiration date. NOTE: This sheet is a summary. It may not cover all possible information. If you have questions about this medicine, talk to your doctor, pharmacist, or health care provider.    2016, [employer]vier/[employer]. (2013-06-05 16:06:21)

## 2014-12-31 NOTE — Assessment & Plan Note (Signed)
With recent progression by ct, for [facility] referral

## 2014-12-31 NOTE — Assessment & Plan Note (Signed)
With 2 days worsening assoc with swelling, hx of lung ca, out of proportion it seems to prior venous insufficiency and peripheral edema, cant r/o dvt - for ER now for u/s as this is after hours.

## 2014-12-31 NOTE — ED Notes (Signed)
Bed: JO83 Expected date:  Expected time:  Means of arrival:  Comments: [place]

## 2014-12-31 NOTE — Progress Notes (Signed)
Pre visit review using our clinic review tool, if applicable. No additional management support is needed unless otherwise documented below in the visit note. 

## 2014-12-31 NOTE — Patient Instructions (Addendum)
Please continue all other medications as before, and refills have been done if requested.  Please have the pharmacy call with any other refills you may need.  Please keep your appointments with your specialists as you may have planned  You will be contacted regarding the referral for: pulmonary  Please go to ER now for further evaluation

## 2015-01-01 ENCOUNTER — Emergency Department (HOSPITAL_COMMUNITY): Admission: EM | Admit: 2015-01-01 | Discharge: 2015-01-01 | Discharge status: Absent without leave | Payer: Medicare HMO

## 2015-01-01 ENCOUNTER — Ambulatory Visit (HOSPITAL_COMMUNITY): Admission: RE | Admit: 2015-01-01 | Payer: Medicare HMO | Source: Ambulatory Visit

## 2015-01-01 ENCOUNTER — Ambulatory Visit (EMERGENCY_DEPARTMENT_HOSPITAL)
Admission: RE | Admit: 2015-01-01 | Discharge: 2015-01-01 | Disposition: A | Discharge status: Home / Self Care | Payer: Medicare HMO | Source: Ambulatory Visit | Attending: Physician Assistant | Admitting: Physician Assistant

## 2015-01-01 DIAGNOSIS — M79609 Pain in unspecified limb: Secondary | ICD-10-CM

## 2015-01-01 NOTE — Progress Notes (Signed)
*  Preliminary Results* Left lower extremity venous duplex completed. Left lower extremity is negative for deep vein thrombosis. There is no evidence of left Baker's cyst.  01/01/2015 4:02 PM  Maudry Mayhew, RVT, RDCS, RDMS

## 2015-01-02 ENCOUNTER — Telehealth: Payer: Self-pay | Admitting: Internal Medicine

## 2015-01-02 NOTE — Telephone Encounter (Addendum)
The LE venous doppler was Neg for DVT   There is no indication for blood thinner at this time.   It appears the fluid in the legs may be related to the lungs (pulmonary HTN)  The chart indicates that she was asked if she was taking the lasix 60 mg twice per day, and she said yes; the prescription was only for #60 with 1 refill in July 2016 by another MD  Is pt taking the lasix? If not, this may need to be restarted

## 2015-01-02 NOTE — Telephone Encounter (Signed)
Pt request result of the ultrasound that was done. Please call her   Phone # [phone]

## 2015-01-03 ENCOUNTER — Other Ambulatory Visit: Payer: Self-pay | Admitting: Internal Medicine

## 2015-01-03 MED ORDER — FUROSEMIDE 40 MG PO TABS: 80.0000 mg | ORAL_TABLET | Freq: Two times a day (BID) | ORAL | Status: DC

## 2015-01-03 MED ORDER — FUROSEMIDE 40 MG PO TABS: 80.0000 mg | ORAL_TABLET | Freq: Two times a day (BID) | ORAL | Status: AC

## 2015-01-03 NOTE — Telephone Encounter (Addendum)
Done erx 

## 2015-01-03 NOTE — Telephone Encounter (Signed)
Pt advised and understood. 

## 2015-01-03 NOTE — Telephone Encounter (Signed)
Ok to increase the lasix to 80 mg twice per day - done erx

## 2015-01-03 NOTE — Telephone Encounter (Signed)
Pt states she has been taking the Lasix '60mg'$  BID and she has an appt with Dr Melvyn Novas 12.16 but she is shill having pain in her LT leg, please advise on leg pain

## 2015-01-03 NOTE — Telephone Encounter (Signed)
Rx faxed to pharmacy  

## 2015-01-14 ENCOUNTER — Ambulatory Visit (INDEPENDENT_AMBULATORY_CARE_PROVIDER_SITE_OTHER): Payer: Medicare HMO | Admitting: Family Medicine

## 2015-01-14 ENCOUNTER — Encounter: Payer: Self-pay | Admitting: Family Medicine

## 2015-01-14 VITALS — BP 118/76 | HR 68 | Wt 169.0 lb

## 2015-01-14 DIAGNOSIS — M2021 Hallux rigidus, right foot: Secondary | ICD-10-CM | POA: Insufficient documentation

## 2015-01-14 DIAGNOSIS — M129 Arthropathy, unspecified: Secondary | ICD-10-CM

## 2015-01-14 DIAGNOSIS — M1710 Unilateral primary osteoarthritis, unspecified knee: Secondary | ICD-10-CM

## 2015-01-14 DIAGNOSIS — IMO0002 Reserved for concepts with insufficient information to code with codable children: Secondary | ICD-10-CM

## 2015-01-14 DIAGNOSIS — M179 Osteoarthritis of knee, unspecified: Secondary | ICD-10-CM | POA: Diagnosis not present

## 2015-01-14 NOTE — Assessment & Plan Note (Signed)
Injected left knee today. Last injection was back in February. Encourage her to continue the home exercises. Discussed custom bracing which patient declined. Discussed formal physical therapy which patient declined. Patient will see if this ulcer calf pain.

## 2015-01-14 NOTE — Progress Notes (Signed)
  Followup  right shoulder pain and bilateral knee follow-up  HPI: Patient is a 74 year old female with significant comorbidities and multiple medical problems coming in with followup of Bilateral shoulder pain. Patient was last seen 4 months ago and was given injections. Patient does have a rotator cuff arthropathy is severe and patient does not want any surgical intervention.patient states though that they seem to be doing relatively well at this time.  Patient is also complaining of bilateral knee pain. Have not injected her knees in quite some time. 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 Patient states left knee is giving her trouble. Patient has had calf pain recently and has seen other providers. Recent Doppler was negative for any type of clot. This was reviewed by me.states sometimes it feels like her leg is giving out on her. Reviewing the chart patient had very similar presentation greater then 1 year ago. Patient did respond very well to steroid injection.  Completed a right-sided and toe pain. States that it seems to be the first toe. Does not rib or any true injury. Hurts with walking. Has not tried any home modalities. No numbness. Has not notice that his been inflamed.  Past medical, surgical, family and social history reviewed. Medications reviewed all in the electronic medical record.   Review of Systems: No headache, visual changes, nausea, vomiting, diarrhea, constipation, dizziness, abdominal pain, skin rash, fevers, chills, night sweats, weight loss, swollen lymph nodes, body aches, joint swelling, muscle aches, chest pain, shortness of breath, mood changes.   Objective:    Blood pressure 118/76, pulse 68, weight 169 lb (76.658 kg), SpO2 90 %.   General: No apparent distress alert and oriented x3 mood and affect normal, dressed appropriately. Patient does have some increased kyphosis HEENT: Pupils equal, extraocular movements  intact Respiratory: Patient's speak in full sentences and does not appear short of breath clear to auscultation with minimal crackles of the basilar lungs Cardiovascular: +1 pitting edema to midcalf bilaterally improved from previous exam Skin: Warm dry intact with no signs of infection or rash on extremities or on axial skeleton. Abdomen: Soft nontender Neuro: Cranial nerves II through XII are intact, neurovascularly intact in all extremities with 2+ DTRs and 2+ pulses. Lymph: No lymphadenopathy of posterior or anterior cervical chain or axillae bilaterally.  Gait patient does walk with a cane but does have good balance  MSK: Non tender with full range of motion and good stability and symmetric strength and tone of, elbows, wrist, hip, and ankles bilaterally.  Left knee is tender to palpation of the medial and lateral joint lines. Patient has near full range of motion. Crepitus noted. Patient's calf is minimally tender to palpation. Seems to be worse with movement of the knee. Neurovascularly intact distally.  Foot exam shows the patient does have fibular deviation of the large toe. Severely tender to palpation over the first toe. Hallux rigidus noted. Mild overpronation of the hindfoot.   After informed written and verbal consent, patient was seated on exam table. Left knee was prepped with alcohol swab and utilizing anterolateral approach, patient's left knee space was injected with 4:1  marcaine 0.5%: Kenalog '40mg'$ /dL. Patient tolerated the procedure well without immediate complications.     Impression and Recommendations:     This case required medical decision making of moderate complexity.

## 2015-01-14 NOTE — Patient Instructions (Addendum)
Great to see you [name] is your friend Definatley ice the toe We injected one knee to see if that helps the calf pain.  Stay active See me again in 3 weeks if the toe is still in pain and we will do an injection Happy holidays!

## 2015-01-14 NOTE — Assessment & Plan Note (Signed)
Discussed with patient at great length. Patient will do more the home exercises, we discussed proper shoewear, patient will try icing. Had any worsening symptoms we'll consider injection. Patient declined that today. Do not feel that patient is a candidate for custom orthotics.

## 2015-01-29 ENCOUNTER — Other Ambulatory Visit: Payer: Self-pay | Admitting: Internal Medicine

## 2015-01-31 ENCOUNTER — Other Ambulatory Visit (INDEPENDENT_AMBULATORY_CARE_PROVIDER_SITE_OTHER): Payer: Medicare HMO

## 2015-01-31 ENCOUNTER — Encounter: Payer: Self-pay | Admitting: Internal Medicine

## 2015-01-31 ENCOUNTER — Ambulatory Visit (INDEPENDENT_AMBULATORY_CARE_PROVIDER_SITE_OTHER)
Admission: RE | Admit: 2015-01-31 | Discharge: 2015-01-31 | Disposition: A | Discharge status: Home / Self Care | Payer: Medicare HMO | Source: Ambulatory Visit | Attending: Internal Medicine | Admitting: Internal Medicine

## 2015-01-31 ENCOUNTER — Ambulatory Visit (INDEPENDENT_AMBULATORY_CARE_PROVIDER_SITE_OTHER): Payer: Medicare HMO | Admitting: Internal Medicine

## 2015-01-31 VITALS — BP 128/68 | HR 63 | Ht 62.0 in | Wt 169.0 lb

## 2015-01-31 DIAGNOSIS — J849 Interstitial pulmonary disease, unspecified: Secondary | ICD-10-CM | POA: Diagnosis not present

## 2015-01-31 DIAGNOSIS — R0600 Dyspnea, unspecified: Secondary | ICD-10-CM | POA: Diagnosis not present

## 2015-01-31 DIAGNOSIS — J9611 Chronic respiratory failure with hypoxia: Secondary | ICD-10-CM

## 2015-01-31 LAB — CBC WITH DIFFERENTIAL/PLATELET
BASOS ABS: 0 10*3/uL (ref 0.0–0.1)
Basophils Relative: 0.9 % (ref 0.0–3.0)
EOS ABS: 0.1 10*3/uL (ref 0.0–0.7)
Eosinophils Relative: 2.3 % (ref 0.0–5.0)
HEMATOCRIT: 35.9 % — AB (ref 36.0–46.0)
Hemoglobin: 11.6 g/dL — ABNORMAL LOW (ref 12.0–15.0)
LYMPHS ABS: 1.1 10*3/uL (ref 0.7–4.0)
LYMPHS PCT: 20.9 % (ref 12.0–46.0)
MCHC: 32.3 g/dL (ref 30.0–36.0)
MCV: 102.2 fl — AB (ref 78.0–100.0)
MONOS PCT: 11.3 % (ref 3.0–12.0)
Monocytes Absolute: 0.6 10*3/uL (ref 0.1–1.0)
NEUTROS ABS: 3.3 10*3/uL (ref 1.4–7.7)
NEUTROS PCT: 64.6 % (ref 43.0–77.0)
PLATELETS: 209 10*3/uL (ref 150.0–400.0)
RBC: 3.51 Mil/uL — AB (ref 3.87–5.11)
RDW: 14 % (ref 11.5–15.5)
WBC: 5.1 10*3/uL (ref 4.0–10.5)

## 2015-01-31 LAB — BASIC METABOLIC PANEL
BUN: 67 mg/dL — ABNORMAL HIGH (ref 6–23)
CALCIUM: 9.5 mg/dL (ref 8.4–10.5)
CHLORIDE: 102 meq/L (ref 96–112)
CO2: 26 meq/L (ref 19–32)
Creatinine, Ser: 2.51 mg/dL — ABNORMAL HIGH (ref 0.40–1.20)
GFR: 24.06 mL/min — ABNORMAL LOW (ref >60.00)
Glucose, Bld: 74 mg/dL (ref 70–99)
POTASSIUM: 4.4 meq/L (ref 3.5–5.1)
SODIUM: 138 meq/L (ref 135–145)

## 2015-01-31 LAB — BRAIN NATRIURETIC PEPTIDE: PRO B NATRI PEPTIDE: 153 pg/mL — AB (ref 0.0–100.0)

## 2015-01-31 LAB — TSH: TSH: 2.98 u[IU]/mL (ref 0.35–4.50)

## 2015-01-31 LAB — SEDIMENTATION RATE: SED RATE: 55 mm/h — AB (ref 0–22)

## 2015-01-31 NOTE — Progress Notes (Signed)
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  Pre-op FEV1 was 1.53 (80%) & DLCO 48%  CT chest 2011 no reccurence  CT chest 09/2010 with stable changes.  Spirometry 01/11/2011 >>FEV1 66%, ratio >69 CT chest 09/2011 >stable changes     02 dep since appendectomy 05/2012  with worse sob summer 2016 referred 01/31/2015 back to pulmonary clinic by Dr [name] p admit:     Admit date: 09/12/2014 Discharge date: 09/14/2014   Recommendations for Outpatient Follow-up:  1. Repeat BMET to follow electrolytes and renal function 2. Reassess BP and adjust antihypertensive agents as needed 3. Please follow volume status closely  Discharge Diagnoses:  Principal Problem:  Acute on chronic diastolic CHF (congestive heart failure), NYHA class 1   HLD (hyperlipidemia)  Iron deficiency anemia  Essential hypertension  BARRETTS ESOPHAGUS  CKD (chronic kidney disease), stage III  COPD, moderate  Acute respiratory failure  Pulmonary hypertension, moderate to severe  Severe tricuspid regurgitation  Acute diastolic heart failure, NYHA class 1   Discharge Condition: stable and improved. Discharge home and will follow with PCP in 10 days  Diet recommendation: heart healthy diet (low sodium, < 2000 mg daily)  Filed Weights   09/12/14 1702 09/13/14 0543 09/14/14 0512  Weight: 77.973 kg (171 lb 14.4 oz) 76.34 kg (168 lb 4.8 oz) 74.844 kg (165 lb)    History of present illness:  74year old female patient with underlying mild to moderate COPD, severe pulmonary hypertension with associated moderate to severe tricuspid regurgitation and associated grade 1 chronic diastolic heart failure. She also has a history of Barrett's esophagitis, chronic iron deficiency anemia, chronic  back pain and osteoarthritis. She presented to the ER with the history of progressive dyspnea over 3 days with associated lower extremity edema. She's not had any orthopnea but has noticed dyspnea on exertion as well for 3 days. She's not had any cough or fever or other constitutional symptoms. She notified her cardiologist yesterday who instructed her to double up on her Lasix to 80 mg twice a day for at least 3 days. Patient reports that since doubling up on her Lasix she has noticed increased urinary output and decreased swelling in her legs but no improvement in the shortness of breath. She notified the cardiology service and they instructed her to come to the ER further evaluation.  Hospital Course:  1-acute on chronic diastolic heart failure: appears to be secondary to diet indiscretion -good response with use of lasix IV -at discharge no JVD, no leg swelling and no crackles on exam -patient discharge on lasix (dose adjusted to '60mg'$  BID) -advise to follow low sodium diet and to check her weight on daily basis -advise to use flutter valve as instructed   2-acute on chronic renal failure: appears to be secondary to decrease perfusion with ongoing heart failure exacerbation -improved with diuresis and back to her baseline at discharge -will recommend follow up of BMET during HWilkersonvisit (especially given lasix adjustments) -CKD stage 3 at baseline  3-abd pain: unclear etiology at this moment. Maybe due to obstipation -no nausea, no vomiting -reports passing some flatus and no having pain on day of discharge  4-essential HTN: stable and well controlled -continue current  regimen -tolerated adjustment on her lasix -will recommend low sodium diet  5-iron deficiency anemia: Hgb stable -will monitor trend  6-COPD: currently compensated -will continue COPD regimen  -no wheezing at discharge  7-HLD: will continue statins  8-Hypoxia: -patient reported having to use some oxygen in the past  due to same -found to have O2 sat in the 86-87% on activity -resolved with O2 supplementation by [place] 2-3L -home oxygen arranged  Procedures:  See below for x-ray reports  Consultations:  None       01/31/2015 1st [facility] office visit/ [name]   Chief Complaint  Patient presents with  . PULMONARY CONSULT    Referred by Dr. Cathlean [name]. Pt states that her breathing has improved since being put on O2/2L while ambulating but still has some DOE. Pt denies cough/wheeze/CP/tightness.   better p admit, then gradually worse correlated somewhat with leg swelling/ can't lie flat s smothering always ok at rest sitting on 02   No obvious other patterns in day to day or daytime variabilty or assoc chronic cough or cp or chest tightness, subjective wheeze overt sinus or hb symptoms. No unusual exp hx or h/o childhood pna/ asthma or knowledge of premature birth.  Sleeping ok without nocturnal  or early am exacerbation  of respiratory  c/o's or need for noct saba. Also denies any obvious fluctuation of symptoms with weather or environmental changes or other aggravating or alleviating factors except as outlined above   Current Medications, Allergies, Complete Past Medical History, Past Surgical History, Family History, and Social History were reviewed in [employer] record.            Review of Systems  Constitutional: Negative for fever and unexpected weight change.  HENT: Positive for congestion, postnasal drip and rhinorrhea. Negative for dental problem, ear pain, nosebleeds, sinus pressure, sneezing, sore throat and trouble swallowing.   Eyes: Positive for itching. Negative for redness.  Respiratory: Positive for shortness of breath. Negative for cough, chest tightness and wheezing.   Cardiovascular: Positive for leg swelling. Negative for palpitations.  Gastrointestinal: Negative.  Negative for nausea and vomiting.  Endocrine: Negative.   Genitourinary:  Negative.  Negative for dysuria.  Musculoskeletal: Positive for arthralgias. Negative for joint swelling.  Skin: Negative.  Negative for rash.  Allergic/Immunologic: Negative.   Neurological: Positive for headaches.  Hematological: Bruises/bleeds easily.  Psychiatric/Behavioral: Negative.  Negative for dysphoric mood. The patient is not nervous/anxious.        Objective:   Physical Exam  amb bf nad/ very shaky on details of care and slow to respond to specifics "how long has your breathing been like this"     Wt Readings from Last 3 Encounters:  01/31/15 169 lb (76.658 kg)  01/14/15 169 lb (76.658 kg)  12/31/14 166 lb (75.297 kg)    Vital signs reviewed    HEENT: nl  turbinates, and oropharynx. Nl external ear canals without cough reflex - full dentures    NECK :  without JVD/Nodes/TM/ nl carotid upstrokes bilaterally   LUNGS: no acc muscle use,  Coarse bronchovesicular bs bialterally s cough on insp    CV:  RRR  no s3 or murmur or increase in P2, no edema   ABD:  soft and nontender with nl inspiratory excursion in the supine position. No bruits or organomegaly, bowel sounds nl  MS:  Nl gait/ ext warm without deformities, calf tenderness, cyanosis or clubbing No obvious joint restrictions   SKIN: warm and dry without  lesions    NEURO:  alert, approp, nl sensorium with  no motor deficits    CXR PA and Lateral:   01/31/2015 :    I personally reviewed images and agree with radiology impression as follows:    Continued bilateral lower lobe airspace opacities, right greater than left, not significantly changed. Findings concerning for Pneumonia.  Labs ordered/ reviewed:      Chemistry      Component Value Date/Time   NA 138 01/31/2015 1200   NA 139 12/09/2014 1032   NA 147* 09/20/2011 0817   K 4.4 01/31/2015 1200   K 4.5 12/09/2014 1032   K 4.2 09/20/2011 0817   CL 102 01/31/2015 1200   CL 100 09/20/2011 0817   CO2 26 01/31/2015 1200   CO2 23 12/09/2014  1032   CO2 29 09/20/2011 0817   BUN 67* 01/31/2015 1200   BUN 50.2* 12/09/2014 1032   BUN 20 09/20/2011 0817   CREATININE 2.51* 01/31/2015 1200   CREATININE 2.2* 12/09/2014 1032   CREATININE 1.6* 09/20/2011 0817      Component Value Date/Time   CALCIUM 9.5 01/31/2015 1200   CALCIUM 9.0 12/09/2014 1032   CALCIUM 9.1 09/20/2011 0817   ALKPHOS 50 12/31/2014 2135   ALKPHOS 51 12/09/2014 1032   ALKPHOS 69 09/20/2011 0817   AST 11* 12/31/2014 2135   AST 8 12/09/2014 1032   AST 17 09/20/2011 0817   ALT 14 12/31/2014 2135   ALT 12 12/09/2014 1032   ALT 23 09/20/2011 0817   BILITOT 0.3 12/31/2014 2135   BILITOT <0.30 12/09/2014 1032   BILITOT 0.60 09/20/2011 0817        Lab Results  Component Value Date   WBC 5.1 01/31/2015   HGB 11.6* 01/31/2015   HCT 35.9* 01/31/2015   MCV 102.2* 01/31/2015   PLT 209.0 01/31/2015         Lab Results  Component Value Date   TSH 2.98 01/31/2015     Lab Results  Component Value Date   PROBNP 153.0* 01/31/2015       Lab Results  Component Value Date   ESRSEDRATE 55* 01/31/2015   ESRSEDRATE 73* 06/09/2010   ESRSEDRATE 86* 02/26/2008               Assessment & Plan:

## 2015-01-31 NOTE — Patient Instructions (Addendum)
Please remember to go to the lab and x-ray department downstairs for your tests - we will call you with the results when they are available and determine whether further pulmonary follow up is needed and share this info with your cardiologist       Late add : set up f/u with Dr Alva/ next avail

## 2015-02-02 ENCOUNTER — Encounter: Payer: Self-pay | Admitting: Internal Medicine

## 2015-02-02 DIAGNOSIS — J9611 Chronic respiratory failure with hypoxia: Secondary | ICD-10-CM | POA: Insufficient documentation

## 2015-02-02 NOTE — Assessment & Plan Note (Addendum)
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  If deteriorates in the absence of obvious chf/ vol overload would challenge with intermediate dose of steroids eg 20 mg daily x 2 weeks to reduce the chance of adverse drug effects   Total time devoted to counseling  = 35/20mreview case with pt/ discussion of options/alternatives/ giving and going over instructions (see avs)

## 2015-02-02 NOTE — Assessment & Plan Note (Signed)
On 02 x 2014 2lpm   Adequate control on present rx, reviewed > no change in rx needed

## 2015-02-02 NOTE — Assessment & Plan Note (Addendum)
01/31/2015   Walked 2lpm  2 laps @ 185 ft each stopped due to  Fatigue > sob/ no desat - spirometry 01/31/2015   FEV1  1.05 (64%) ratio 74  So no evidence of sign airflow obst here/ purely restrictive  No evidence of significant chf at present though her acute deterioration events certainly sound like heart failure/ vol overload related to CRI

## 2015-02-04 ENCOUNTER — Ambulatory Visit: Payer: Medicare HMO | Admitting: Family Medicine

## 2015-02-04 ENCOUNTER — Telehealth: Payer: Self-pay | Admitting: *Deleted

## 2015-02-04 NOTE — Telephone Encounter (Signed)
-----   Message from [place] sent at 02/03/2015  9:46 AM EST ----- Pt needs appt to see RA sometime after first of year. Didn't see anything until last week of March...  Thanks- Vita Barley

## 2015-02-04 NOTE — Telephone Encounter (Signed)
RA - please advise where to put patient on your schedule.   Thanks.

## 2015-02-12 ENCOUNTER — Ambulatory Visit: Payer: Medicare HMO | Admitting: Internal Medicine

## 2015-02-17 NOTE — Telephone Encounter (Signed)
Place patient on call list- I with open additional office times

## 2015-02-18 NOTE — Telephone Encounter (Signed)
Placed on call list, will contact patient when RA opens clinic Nothing further needed.

## 2015-03-10 ENCOUNTER — Encounter: Payer: Self-pay | Admitting: Pulmonary Disease

## 2015-03-10 ENCOUNTER — Ambulatory Visit (INDEPENDENT_AMBULATORY_CARE_PROVIDER_SITE_OTHER): Payer: Medicare HMO | Admitting: Pulmonary Disease

## 2015-03-10 VITALS — BP 128/62 | HR 65 | Ht 62.0 in | Wt 174.0 lb

## 2015-03-10 DIAGNOSIS — I272 Other secondary pulmonary hypertension: Secondary | ICD-10-CM

## 2015-03-10 DIAGNOSIS — J849 Interstitial pulmonary disease, unspecified: Secondary | ICD-10-CM

## 2015-03-10 DIAGNOSIS — J9611 Chronic respiratory failure with hypoxia: Secondary | ICD-10-CM

## 2015-03-10 NOTE — Assessment & Plan Note (Signed)
Appears to be secondary to lung disease and diastolic dysfunction Not a candidate for pulmonary vasodilators Continue oxygen Prognosis guarded given severity

## 2015-03-10 NOTE — Addendum Note (Signed)
Addended by: Beckie Busing on: 03/10/2015 01:47 PM   Modules accepted: Orders

## 2015-03-10 NOTE — Assessment & Plan Note (Addendum)
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

## 2015-03-10 NOTE — Progress Notes (Signed)
Subjective:    Patient ID: [name], female    DOB: 12-10-1940, 75 y.o.   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   Chief Complaint  Patient presents with  . Follow-up    Pt c/o occasoinal cough at bedtime and continued SOB with exertion. Pt denies wheeze/CP/tightness.     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 HYPERLIPIDEMIA 11/18/2006  . HYPERKALEMIA 02/05/2008  . ANEMIA-IRON DEFICIENCY 09/22/2008  . Anemia of other chronic disease 02/05/2008  . ANXIETY 06/03/2008  . DEPRESSION 04/28/2009  . HYPERTENSION 11/18/2006  . PERICARDITIS 01/03/2007  . AORTIC STENOSIS/ INSUFFICIENCY, NON-RHEUMATIC 12/03/2008  . SINUSITIS- ACUTE-NOS 11/20/2007  . SINUSITIS, CHRONIC 02/05/2008  . ALLERGIC RHINITIS 11/18/2006  . PNEUMONIA 05/02/2007  . C O P D 06/27/2008  . Stricture and stenosis  of esophagus 11/14/2008  . GERD 01/03/2007  . BARRETTS ESOPHAGUS 11/14/2008  . DYSPEPSIA 03/23/2007  . PRURITUS 10/08/2008  . OSTEOARTHRITIS 11/18/2006  . OSTEOARTHRITIS, KNEE, LEFT 01/03/2007  . Cervicalgia 01/13/2009  . BACK PAIN 02/04/2009    is improved  . BURSITIS, LEFT HIP 04/18/2009  . OSTEOPOROSIS 11/18/2006  . INSOMNIA-SLEEP DISORDER-UNSPEC 06/03/2008  . FATIGUE 04/18/2009  . PERIPHERAL EDEMA 02/13/2009  . MURMUR 11/18/2006  . DYSPHAGIA UNSPECIFIED 03/23/2007  . Abdominal pain, generalized 03/01/2007  . Nonspecific (abnormal) findings on radiological and other examination of body structure 06/03/2008  . Tuberculin Test Reaction 11/18/2006  . Acute bronchitis 02/23/2010  . CHOLELITHIASIS 04/24/2010  . NEPHROLITHIASIS, HX OF 04/24/2010  . Polyarthralgia 06/09/2010  . Elevated sed rate 06/11/2010  . Positive ANA (antinuclear antibody) 06/11/2010  . Rheumatoid factor positive 06/11/2010  . [place] ca  . 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 SPONDYLOSIS, CERVICAL, WITH RADICULOPATHY 01/13/2009  . Cervical radiculopathy 09/28/2010  . Lumbar radiculopathy 09/28/2010  . RENAL INSUFFICIENCY 02/05/2008    function has improved.  . Transfusion history 09-01-12    4'14 tranfused x 2 units  . Bleeding nose 09-01-12    past hx. none recent after having surgery  . Wound healing, delayed 09-01-12    right abdominal area        Review of Systems neg for any significant sore throat, dysphagia, itching, sneezing, nasal  congestion or excess/ purulent secretions, fever, chills, sweats, unintended wt loss, pleuritic or exertional cp, hempoptysis, orthopnea pnd or change in chronic leg swelling. Also denies presyncope, palpitations, heartburn, abdominal pain, nausea, vomiting, diarrhea or change in bowel or urinary habits, dysuria,hematuria, rash, arthralgias, visual complaints, headache, numbness weakness or ataxia.     Objective:   Physical Exam  Gen. Pleasant, well-nourished, in no  distress ENT - no lesions, no post nasal drip Neck: No JVD, no thyromegaly, no carotid bruits Lungs: no use of accessory muscles, no dullness to percussion,rt basal rales, no rhonchi  Cardiovascular: Rhythm regular, heart sounds  normal, no murmurs or gallops, no peripheral edema Musculoskeletal: No deformities, no cyanosis or clubbing        Assessment & Plan:

## 2015-03-10 NOTE — Patient Instructions (Signed)
You have  Scarring / fibrosis in your lungs Stay on oxygen Trial of spiriva once daily PFTs next visit High resolution CT scan in March

## 2015-03-10 NOTE — Assessment & Plan Note (Signed)
2L O2 when walking

## 2015-03-12 ENCOUNTER — Other Ambulatory Visit: Payer: Medicare HMO

## 2015-03-12 DIAGNOSIS — J849 Interstitial pulmonary disease, unspecified: Secondary | ICD-10-CM

## 2015-03-13 LAB — ANTI-DNA ANTIBODY, DOUBLE-STRANDED: DS DNA AB: 1 [IU]/mL

## 2015-03-13 LAB — CYCLIC CITRUL PEPTIDE ANTIBODY, IGG: CYCLIC CITRULLIN PEPTIDE AB: 77 U — AB

## 2015-03-14 LAB — ANTI-NUCLEAR AB-TITER (ANA TITER)

## 2015-03-14 LAB — ANA: Anti Nuclear Antibody(ANA): POSITIVE — AB

## 2015-03-18 ENCOUNTER — Telehealth: Payer: Self-pay | Admitting: *Deleted

## 2015-03-18 DIAGNOSIS — R768 Other specified abnormal immunological findings in serum: Secondary | ICD-10-CM

## 2015-03-18 NOTE — Telephone Encounter (Signed)
-----   Message from Rigoberto Noel, MD sent at 03/17/2015  4:51 PM EST ----- Screening test for lupus was positive again - just like 4y ago Pl refer to rheumatology

## 2015-03-27 ENCOUNTER — Encounter: Payer: Self-pay | Admitting: Internal Medicine

## 2015-03-27 ENCOUNTER — Ambulatory Visit (INDEPENDENT_AMBULATORY_CARE_PROVIDER_SITE_OTHER): Payer: Medicare HMO | Admitting: Internal Medicine

## 2015-03-27 VITALS — BP 136/86 | HR 67 | Temp 98.3°F | Resp 20 | Wt 174.0 lb

## 2015-03-27 DIAGNOSIS — Z0000 Encounter for general adult medical examination without abnormal findings: Secondary | ICD-10-CM

## 2015-03-27 DIAGNOSIS — R0600 Dyspnea, unspecified: Secondary | ICD-10-CM

## 2015-03-27 DIAGNOSIS — N183 Chronic kidney disease, stage 3 unspecified: Secondary | ICD-10-CM

## 2015-03-27 DIAGNOSIS — Z0189 Encounter for other specified special examinations: Secondary | ICD-10-CM

## 2015-03-27 DIAGNOSIS — I10 Essential (primary) hypertension: Secondary | ICD-10-CM | POA: Diagnosis not present

## 2015-03-27 NOTE — Progress Notes (Signed)
Pre visit review using our clinic review tool, if applicable. No additional management support is needed unless otherwise documented below in the visit note. 

## 2015-03-27 NOTE — Progress Notes (Signed)
Subjective:    Patient ID: [name], female    DOB: 06-Apr-1940, 75 y.o.   MRN: [id number]  HPI    Here to f/u; overall doing ok,  Pt denies chest pain, increasing sob or doe, wheezing, orthopnea, PND, increased LE swelling, palpitations, dizziness or syncope though does have some mild increased doe in the past few wks. Still on home o2 2L continuous without change, sees pulm recently. No new cough, ST, fever.   Pt denies fever, wt loss, night sweats, loss of appetite, or other constitutional symptoms.  Pt denies new neurological symptoms such as new headache, or facial or extremity weakness or numbness.  Pt denies polydipsia, polyuria, or low sugar episode.   Pt denies new neurological symptoms such as new headache, or facial or extremity weakness or numbness.   Pt states overall good compliance with meds, mostly trying to follow appropriate diet, with wt overall up a few lbs however.  Did see rheum recently, felt to be doing well per pt, no med changes Wt Readings from Last 3 Encounters:  03/27/15 174 lb (78.926 kg)  03/10/15 174 lb (78.926 kg)  01/31/15 169 lb (76.658 kg)   Past Medical History  Diagnosis Date  . HYPERLIPIDEMIA 11/18/2006  . HYPERKALEMIA 02/05/2008  . ANEMIA-IRON DEFICIENCY 09/22/2008  . Anemia of other chronic disease 02/05/2008  . ANXIETY 06/03/2008  . DEPRESSION 04/28/2009  . HYPERTENSION 11/18/2006  . PERICARDITIS 01/03/2007  . AORTIC STENOSIS/ INSUFFICIENCY, NON-RHEUMATIC 12/03/2008  . SINUSITIS- ACUTE-NOS 11/20/2007  . SINUSITIS, CHRONIC 02/05/2008  . ALLERGIC RHINITIS 11/18/2006  . PNEUMONIA 05/02/2007  . C O P D 06/27/2008  . Stricture and stenosis of esophagus 11/14/2008  . GERD 01/03/2007  . BARRETTS ESOPHAGUS 11/14/2008  . DYSPEPSIA 03/23/2007  . PRURITUS 10/08/2008  . OSTEOARTHRITIS 11/18/2006  . OSTEOARTHRITIS, KNEE, LEFT 01/03/2007  . Cervicalgia 01/13/2009  . BACK PAIN 02/04/2009    is improved  . BURSITIS, LEFT HIP 04/18/2009  . OSTEOPOROSIS 11/18/2006  .  INSOMNIA-SLEEP DISORDER-UNSPEC 06/03/2008  . FATIGUE 04/18/2009  . PERIPHERAL EDEMA 02/13/2009  . MURMUR 11/18/2006  . DYSPHAGIA UNSPECIFIED 03/23/2007  . Abdominal pain, generalized 03/01/2007  . Nonspecific (abnormal) findings on radiological and other examination of body structure 06/03/2008  . Tuberculin Test Reaction 11/18/2006  . Acute bronchitis 02/23/2010  . CHOLELITHIASIS 04/24/2010  . NEPHROLITHIASIS, HX OF 04/24/2010  . Polyarthralgia 06/09/2010  . Elevated sed rate 06/11/2010  . Positive ANA (antinuclear antibody) 06/11/2010  . Rheumatoid factor positive 06/11/2010  . Cancer ([place])     lung ca  . 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 SPONDYLOSIS, CERVICAL, WITH RADICULOPATHY 01/13/2009  . Cervical radiculopathy 09/28/2010  . Lumbar radiculopathy 09/28/2010  . RENAL INSUFFICIENCY 02/05/2008    function has improved.  . Transfusion history 09-01-12    4'14 tranfused x 2 units  . Bleeding nose 09-01-12    past hx. none recent after having surgery  . Wound healing, delayed 09-01-12    right abdominal area   Past Surgical History  Procedure Laterality Date  . Abdominal hysterectomy    . Tubal ligation    . Tumor removal  08/06/08    from lungs  . Anterior cervical decomp/discectomy fusion  01/15/2011    Procedure: ANTERIOR CERVICAL DECOMPRESSION/DISCECTOMY FUSION 2 LEVELS;  Surgeon: Cooper Render Pool;  Location: [facility];  Service: Neurosurgery;  Laterality: N/A;  Cervical Three-Four, Cervical Four-Five Anterior Cervical Decompression Fusion   . Colonscopy    . Nose surgery    .  Laparoscopic appendectomy N/A 05/25/2012    Procedure: APPENDECTOMY LAPAROSCOPIC  CONVERTED TO  OPEN APPENDECTOMY, exploratory laparatomy;  Surgeon: Odis Hollingshead, MD;  Location: [facility];  Service: General;  Laterality: N/A;  . Bowel resection N/A 05/25/2012    Procedure: SMALL BOWEL RESECTION;  Surgeon: Odis Hollingshead, MD;  Location: [facility];  Service: General;  Laterality: N/A;  . Laparoscopic lysis of  adhesions N/A 05/25/2012    Procedure: LAPAROSCOPIC LYSIS OF ADHESIONS;  Surgeon: Odis Hollingshead, MD;  Location: [facility];  Service: General;  Laterality: N/A;  . Appendectomy    . Cataract extraction, bilateral  09-01-12  . Debridement of abdominal wall abscess N/A 09/07/2012    Procedure:  ABDOMINAL WOUND IRRIGATION AND TWO LAYERED CLOSURE;  Surgeon: Odis Hollingshead, MD;  Location: [facility];  Service: General;  Laterality: N/A;    reports that she quit smoking about 9 years ago. Her smoking use included Cigarettes. She has a 60 pack-year smoking history. She has quit using smokeless tobacco. She reports that she does not drink alcohol or use illicit drugs. family history includes Cancer in her brother; Coronary artery disease in her other; Heart disease in her father; Hyperlipidemia in her other; Hypertension in her other. There is no history of Colon cancer, Esophageal cancer, Rectal cancer, or Stomach cancer. Allergies  Allergen Reactions  . Amoxicillin Itching    Has patient had a PCN reaction causing immediate rash, facial/tongue/throat swelling, SOB or lightheadedness with hypotension: No Has patient had a PCN reaction causing severe rash involving mucus membranes or skin necrosis: No Has patient had a PCN reaction that required hospitalization No Has patient had a PCN reaction occurring within the last 10 years: No If all of the above answers are "NO", then may proceed with Cephalosporin use.   . Fentanyl Other (See Comments)    hallucinations  . Hydrocodone Other (See Comments)    ineffective  . Lactose Intolerance (Gi) Diarrhea  . Codeine Hives and Rash  . Penicillins Hives and Rash   Current Outpatient Prescriptions on File Prior to Visit  Medication Sig Dispense Refill  . acetaminophen (TYLENOL) 500 MG tablet Take 1,000 mg by mouth every 6 (six) hours as needed (pain).    Marland Kitchen alendronate (FOSAMAX) 70 MG tablet Take 1 tablet (70 mg total) by mouth every 7 (seven) days. Take with a  full glass of water on an empty stomach. Patient takes on Mondays. 4 tablet 11  . aspirin 325 MG tablet Take 325 mg by mouth daily.    . Calcium Citrate-Vitamin D (CALCIUM + D PO) Take 1 tablet by mouth daily.    . citalopram (CELEXA) 10 MG tablet TAKE 1 TABLET (10 MG TOTAL) BY MOUTH EVERY MORNING. 90 tablet 2  . FIBER SELECT GUMMIES CHEW Chew 2 tablets by mouth every other day.     . furosemide (LASIX) 40 MG tablet Take 2 tablets (80 mg total) by mouth 2 (two) times daily. 120 tablet 5  . KLOR-CON M20 20 MEQ tablet TAKE 1 TABLET BY MOUTH DAILY 30 tablet 5  . metoprolol succinate (TOPROL-XL) 50 MG 24 hr tablet Take 1 tablet (50 mg total) by mouth daily. Take with or immediately following a meal. 90 tablet 3  . pantoprazole (PROTONIX) 40 MG tablet TAKE 1 TABLET (40 MG TOTAL) BY MOUTH DAILY. 90 tablet 3  . pravastatin (PRAVACHOL) 40 MG tablet TAKE 1 TABLET BY MOUTH EVERY DAY 90 tablet 3  . tamsulosin (FLOMAX) 0.4 MG CAPS capsule  TAKE ONE CAPSULE BY MOUTH EVERY DAY 30 capsule 5  . traMADol (ULTRAM) 50 MG tablet TAKE 1 TABLET EVERY 8 HOURS AS NEEDED 90 tablet 2   No current facility-administered medications on file prior to visit.   Review of Systems  Constitutional: Negative for unusual diaphoresis or night sweats HENT: Negative for ringing in ear or discharge Eyes: Negative for double vision or worsening visual disturbance.  Respiratory: Negative for choking and stridor.   Gastrointestinal: Negative for vomiting or other signifcant bowel change Genitourinary: Negative for hematuria or change in urine volume.  Musculoskeletal: Negative for other MSK pain or swelling Skin: Negative for color change and worsening wound.  Neurological: Negative for tremors and numbness other than noted  Psychiatric/Behavioral: Negative for decreased concentration or agitation other than above       Objective:   Physical Exam BP 136/86 mmHg  Pulse 67  Temp(Src) 98.3 F (36.8 C) (Oral)  Resp 20  Wt 174 lb  (78.926 kg)  SpO2 92% VS noted, non toxic Constitutional: Pt appears in no significant distress HENT: Head: NCAT.  Right Ear: External ear normal.  Left Ear: External ear normal.  Eyes: . Pupils are equal, round, and reactive to light. Conjunctivae and EOM are normal Neck: Normal range of motion. Neck supple.  Cardiovascular: Normal rate and regular rhythm.   Pulmonary/Chest: Effort normal and breath sounds decreased without rales or wheezing., no accessory muscle use  Abd:  Soft, NT, ND, + BS Neurological: Pt is alert. Not confused , motor grossly intact Skin: Skin is warm. No rash, trace bilat LE edema Psychiatric: Pt behavior is normal. No agitation.     Assessment & Plan:

## 2015-03-27 NOTE — Patient Instructions (Signed)
Please continue all other medications as before, and refills have been done if requested.  Please have the pharmacy call with any other refills you may need.  Please continue your efforts at being more active, low cholesterol diet, and weight control.  You are otherwise up to date with prevention measures today.  Please keep your appointments with your specialists as you may have planned  Please return in 6 months, or sooner if needed, with Lab testing done 3-5 days before  

## 2015-03-30 NOTE — Assessment & Plan Note (Signed)
stable overall by history and exam, recent data reviewed with pt, and pt to continue medical treatment as before,  to f/u any worsening symptoms or concerns Lab Results  Component Value Date   CREATININE 2.51* 01/31/2015

## 2015-03-30 NOTE — Assessment & Plan Note (Signed)
Mild subjective worse, exam benign, declines f/u cxr, for cont'd same tx, f/u pulm as well

## 2015-03-30 NOTE — Assessment & Plan Note (Signed)
stable overall by history and exam, recent data reviewed with pt, and pt to continue medical treatment as before,  to f/u any worsening symptoms or concerns BP Readings from Last 3 Encounters:  03/27/15 136/86  03/10/15 128/62  01/31/15 128/68

## 2015-04-02 NOTE — Progress Notes (Signed)
This encounter was created in error - please disregard.

## 2015-04-03 ENCOUNTER — Encounter: Payer: Medicare HMO | Admitting: Cardiology

## 2015-04-03 ENCOUNTER — Ambulatory Visit: Payer: Medicare HMO | Admitting: Cardiovascular Disease

## 2015-04-04 ENCOUNTER — Ambulatory Visit (INDEPENDENT_AMBULATORY_CARE_PROVIDER_SITE_OTHER): Payer: Medicare HMO | Admitting: Cardiology

## 2015-04-04 ENCOUNTER — Encounter: Payer: Self-pay | Admitting: Cardiology

## 2015-04-04 VITALS — BP 140/60 | HR 68 | Ht 62.0 in | Wt 171.0 lb

## 2015-04-04 DIAGNOSIS — I10 Essential (primary) hypertension: Secondary | ICD-10-CM | POA: Diagnosis not present

## 2015-04-04 DIAGNOSIS — I509 Heart failure, unspecified: Secondary | ICD-10-CM | POA: Diagnosis not present

## 2015-04-04 NOTE — Patient Instructions (Signed)
Medication Instructions:   Your physician recommends that you continue on your current medications as directed. Please refer to the Current Medication list given to you today.  If you need a refill on your cardiac medications before your next appointment, please call your pharmacy.  Labwork:  NONE ORDER TODAY      Testing/Procedures:  NONE ORDER TODAY    Follow-Up:   Your physician wants you to follow-up in: [place] will receive a reminder letter in the mail two months in advance. If you don't receive a letter, please call our office to schedule the follow-up appointment.     Any Other Special Instructions Will Be Listed Below (If Applicable).

## 2015-04-04 NOTE — Progress Notes (Signed)
04/04/2015 [name]   January 13, 1941  [id number]  Primary Physician Cathlean Cower, MD Primary Cardiologist: Dr. Angelena Form   Reason for Visit/CC: Routine 1 year f/u for mitral and aortic valvular disease + chronic diastolic HF.   HPI:  75 y/o AAF with history of HTN, hyperlipidemia, esophagitis, pericarditis but no prior diagnosis of CAD who was found to have an obstructing left upper lobe mass c/w squamous cell carcinoma. She was referred in May of 2010 for cardiology risk assessment prior to planned thoracotomy which she had in June 2010. Her echo prior to the surgery showed normal LV function with mild diastolic dysfunction and mild AI. She was seen by Truitt Merle, NP on 03/02/11 for c/o palpitations. An event monitor was placed and this showed PACs with short run 9 beat run of VT. Lower extremity arterial dopplers were normal February 2013. She had some lower extremity edema in 2013 and was started on Lasix by primary care. Echo July 2014 with normal LV function, mild MR, mild AI.   She was last seen by Dr. Angelena Form 02/2014 and was noted to be stable from a CV standpoint. He ordered a repeat 2D echo which was performed 08/2014. This showed no major changes. She continued to have mild mitral and aortic valve diease. He recommended 1 year f/u.  She presents to clinic today for evaluation. She is here with her daughter. She reports that she is doing well. No issues or changes since her last OV. She denies any CP. No resting dyspnea. She has mild DOE which is chronic and she requires supplemental O2 with ambulation. There is no change in her respiratory status. No need for supplemental O2 at rest. She denies LLE, orthopnea or PND. She is fully compliant with daily weights. Weight has remained stable. No dizziness, palpitations, syncope/ near syncope. She reports full medication compliance. EKG today shows NSR. HR is 69 bpm. No ischemic abnormalities BP is stable.    Current Outpatient Prescriptions    Medication Sig Dispense Refill  . acetaminophen (TYLENOL) 500 MG tablet Take 1,000 mg by mouth every 6 (six) hours as needed (pain).    Marland Kitchen alendronate (FOSAMAX) 70 MG tablet Take 1 tablet (70 mg total) by mouth every 7 (seven) days. Take with a full glass of water on an empty stomach. Patient takes on Mondays. 4 tablet 11  . aspirin 325 MG tablet Take 325 mg by mouth daily.    . Calcium Citrate-Vitamin D (CALCIUM + D PO) Take 1 tablet by mouth daily.    . citalopram (CELEXA) 10 MG tablet TAKE 1 TABLET (10 MG TOTAL) BY MOUTH EVERY MORNING. 90 tablet 2  . FIBER SELECT GUMMIES CHEW Chew 2 tablets by mouth every other day.     . furosemide (LASIX) 40 MG tablet Take 2 tablets (80 mg total) by mouth 2 (two) times daily. 120 tablet 5  . KLOR-CON M20 20 MEQ tablet TAKE 1 TABLET BY MOUTH DAILY 30 tablet 5  . metoprolol succinate (TOPROL-XL) 50 MG 24 hr tablet Take 1 tablet (50 mg total) by mouth daily. Take with or immediately following a meal. 90 tablet 3  . pantoprazole (PROTONIX) 40 MG tablet TAKE 1 TABLET (40 MG TOTAL) BY MOUTH DAILY. 90 tablet 3  . pravastatin (PRAVACHOL) 40 MG tablet TAKE 1 TABLET BY MOUTH EVERY DAY 90 tablet 3  . tamsulosin (FLOMAX) 0.4 MG CAPS capsule TAKE ONE CAPSULE BY MOUTH EVERY DAY 30 capsule 5  . traMADol (ULTRAM) 50 MG tablet  TAKE 1 TABLET EVERY 8 HOURS AS NEEDED 90 tablet 2   No current facility-administered medications for this visit.    Allergies  Allergen Reactions  . Amoxicillin Itching    Has patient had a PCN reaction causing immediate rash, facial/tongue/throat swelling, SOB or lightheadedness with hypotension: No Has patient had a PCN reaction causing severe rash involving mucus membranes or skin necrosis: No Has patient had a PCN reaction that required hospitalization No Has patient had a PCN reaction occurring within the last 10 years: No If all of the above answers are "NO", then may proceed with Cephalosporin use.   . Fentanyl Other (See Comments)     hallucinations  . Hydrocodone Other (See Comments)    ineffective  . Lactose Intolerance (Gi) Diarrhea  . Codeine Hives and Rash  . Penicillins Hives and Rash    Social History   Social History  . Marital Status: Widowed    Spouse Name: N/A  . Number of Children: N/A  . Years of Education: N/A   Occupational History  . Not on file.   Social History Main Topics  . Smoking status: Former Smoker -- 1.50 packs/day for 40 years    Types: Cigarettes    Quit date: 02/15/2006  . Smokeless tobacco: Former Systems developer  . Alcohol Use: No  . Drug Use: No  . Sexual Activity: Yes    Birth Control/ Protection: Post-menopausal   Other Topics Concern  . Not on file   Social History Narrative   Lives alone in one level home     Review of Systems: General: negative for chills, fever, night sweats or weight changes.  Cardiovascular: negative for chest pain, dyspnea on exertion, edema, orthopnea, palpitations, paroxysmal nocturnal dyspnea or shortness of breath Dermatological: negative for rash Respiratory: negative for cough or wheezing Urologic: negative for hematuria Abdominal: negative for nausea, vomiting, diarrhea, bright red blood per rectum, melena, or hematemesis Neurologic: negative for visual changes, syncope, or dizziness All other systems reviewed and are otherwise negative except as noted above.    Blood pressure 140/60, pulse 68, height '5\' 2"'$  (1.575 m), weight 171 lb (77.565 kg).  General appearance: alert, cooperative and no distress Neck: no carotid bruit and no JVD Lungs: clear to auscultation bilaterally Heart: regular rate and rhythm, S1, S2 normal, no murmur, click, rub or gallop Extremities: no LEE Pulses: 2+ and symmetric Skin: warm and dry Neurologic: Grossly normal  EKG NSR. HR is 69 bpm. No ischemic abnormalcies.  ASSESSMENT AND PLAN:   1. NSVT: patient denies any recurrent issues with palpitations. EKG shows NSR. HR stable in the 60s.   3. Mitral  Regurgitation: recent echo 08/2014 showed only mild MR. No significant dyspnea.   4. Aortic Insufficiency: trivial to mild AI noted on echo 08/2014. No significant dyspnea.   3. Chronic Diastolic CHF: Grade 1 DD noted on echo 08/2014. No significant dyspnea. Weight is stable. Euvolemic on exam. Continue daily lasix. I recommended a BMP to assess renal function and K. She notes she has an appt with PCP next week for routine blood work. Will check then We discussed continuation with low sodium diet and daily weights. Patient will notify our office if any weight gain, edema or  increased O2 demands.   4. HTN: stable. 140/60. Followed by PCP.   5. HLD: on pravastatin. Followed by PCP.   PLAN  No change in medical therapy. F/u in 1 year with Dr. Angelena Form or sooner if needed.   Ahniyah Giancola PA-C  04/04/2015 11:41 AM

## 2015-04-18 ENCOUNTER — Other Ambulatory Visit: Payer: Self-pay | Admitting: Internal Medicine

## 2015-04-21 ENCOUNTER — Encounter: Payer: Self-pay | Admitting: Family Medicine

## 2015-04-21 ENCOUNTER — Ambulatory Visit (INDEPENDENT_AMBULATORY_CARE_PROVIDER_SITE_OTHER): Payer: Medicare HMO | Admitting: Family Medicine

## 2015-04-21 VITALS — BP 94/60 | HR 72 | Ht 62.0 in | Wt 167.0 lb

## 2015-04-21 DIAGNOSIS — M12811 Other specific arthropathies, not elsewhere classified, right shoulder: Secondary | ICD-10-CM | POA: Diagnosis not present

## 2015-04-21 DIAGNOSIS — M75101 Unspecified rotator cuff tear or rupture of right shoulder, not specified as traumatic: Principal | ICD-10-CM

## 2015-04-21 MED ORDER — TIZANIDINE HCL 2 MG PO CAPS: 2.0000 mg | ORAL_CAPSULE | Freq: Every evening | ORAL | Status: DC | PRN

## 2015-04-21 NOTE — Progress Notes (Signed)
Followup  right shoulder pain and bilateral knee follow-up  HPI: Patient is a 75 year old female with significant comorbidities and multiple medical problems coming in with followup of Bilateral shoulder pain. Patient was having worsening right shoulder pain. States it is more of a dull, throbbing aching sensation. Has been many months since patient has had an injection review sleep. Patient states that it is waking her up at night and making it difficult to do home exercises..  Patient was last seen for her right knee and was given an injection. Starting have increasing pain again. Still avoid any other injections even though it is not significantly better at this time. Has some instability but does not want a custom brace.  Past Medical History  Diagnosis Date  . HYPERLIPIDEMIA 11/18/2006  . HYPERKALEMIA 02/05/2008  . ANEMIA-IRON DEFICIENCY 09/22/2008  . Anemia of other chronic disease 02/05/2008  . ANXIETY 06/03/2008  . DEPRESSION 04/28/2009  . HYPERTENSION 11/18/2006  . PERICARDITIS 01/03/2007  . AORTIC STENOSIS/ INSUFFICIENCY, NON-RHEUMATIC 12/03/2008  . SINUSITIS- ACUTE-NOS 11/20/2007  . SINUSITIS, CHRONIC 02/05/2008  . ALLERGIC RHINITIS 11/18/2006  . PNEUMONIA 05/02/2007  . C O P D 06/27/2008  . Stricture and stenosis of esophagus 11/14/2008  . GERD 01/03/2007  . BARRETTS ESOPHAGUS 11/14/2008  . DYSPEPSIA 03/23/2007  . PRURITUS 10/08/2008  . OSTEOARTHRITIS 11/18/2006  . OSTEOARTHRITIS, KNEE, LEFT 01/03/2007  . Cervicalgia 01/13/2009  . BACK PAIN 02/04/2009    is improved  . BURSITIS, LEFT HIP 04/18/2009  . OSTEOPOROSIS 11/18/2006  . INSOMNIA-SLEEP DISORDER-UNSPEC 06/03/2008  . FATIGUE 04/18/2009  . PERIPHERAL EDEMA 02/13/2009  . MURMUR 11/18/2006  . DYSPHAGIA UNSPECIFIED 03/23/2007  . Abdominal pain, generalized 03/01/2007  . Nonspecific (abnormal) findings on radiological and other examination of body structure 06/03/2008  . Tuberculin Test Reaction 11/18/2006  . Acute bronchitis 02/23/2010  .  CHOLELITHIASIS 04/24/2010  . NEPHROLITHIASIS, HX OF 04/24/2010  . Polyarthralgia 06/09/2010  . Elevated sed rate 06/11/2010  . Positive ANA (antinuclear antibody) 06/11/2010  . Rheumatoid factor positive 06/11/2010  . Cancer ([place])     lung ca  . 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 SPONDYLOSIS, CERVICAL, WITH RADICULOPATHY 01/13/2009  . Cervical radiculopathy 09/28/2010  . Lumbar radiculopathy 09/28/2010  . RENAL INSUFFICIENCY 02/05/2008    function has improved.  . Transfusion history 09-01-12    4'14 tranfused x 2 units  . Bleeding nose 09-01-12    past hx. none recent after having surgery  . Wound healing, delayed 09-01-12    right abdominal area   Past Surgical History  Procedure Laterality Date  . Abdominal hysterectomy    . Tubal ligation    . Tumor removal  08/06/08    from lungs  . Anterior cervical decomp/discectomy fusion  01/15/2011    Procedure: ANTERIOR CERVICAL DECOMPRESSION/DISCECTOMY FUSION 2 LEVELS;  Surgeon: Cooper Render Pool;  Location: [facility];  Service: Neurosurgery;  Laterality: N/A;  Cervical Three-Four, Cervical Four-Five Anterior Cervical Decompression Fusion   . Colonscopy    . Nose surgery    . Laparoscopic appendectomy N/A 05/25/2012    Procedure: APPENDECTOMY LAPAROSCOPIC  CONVERTED TO  OPEN APPENDECTOMY, exploratory laparatomy;  Surgeon: Odis Hollingshead, MD;  Location: [facility];  Service: General;  Laterality: N/A;  . Bowel resection N/A 05/25/2012    Procedure: SMALL BOWEL RESECTION;  Surgeon: Odis Hollingshead, MD;  Location: [facility];  Service: General;  Laterality: N/A;  . Laparoscopic lysis of adhesions N/A 05/25/2012    Procedure: LAPAROSCOPIC LYSIS OF ADHESIONS;  Surgeon: Odis Hollingshead, MD;  Location: [facility];  Service: General;  Laterality: N/A;  . Appendectomy    . Cataract extraction, bilateral  09-01-12  . Debridement of abdominal wall abscess N/A 09/07/2012    Procedure:  ABDOMINAL WOUND IRRIGATION AND TWO LAYERED CLOSURE;  Surgeon: Odis Hollingshead, MD;  Location: [facility];  Service: General;  Laterality: N/A;   Social History  Substance Use Topics  . Smoking status: Former Smoker -- 1.50 packs/day for 40 years    Types: Cigarettes    Quit date: 02/15/2006  . Smokeless tobacco: Former Systems developer  . Alcohol Use: No   Allergies  Allergen Reactions  . Amoxicillin Itching    Has patient had a PCN reaction causing immediate rash, facial/tongue/throat swelling, SOB or lightheadedness with hypotension: No Has patient had a PCN reaction causing severe rash involving mucus membranes or skin necrosis: No Has patient had a PCN reaction that required hospitalization No Has patient had a PCN reaction occurring within the last 10 years: No If all of the above answers are "NO", then may proceed with Cephalosporin use.   . Fentanyl Other (See Comments)    hallucinations  . Hydrocodone Other (See Comments)    ineffective  . Lactose Intolerance (Gi) Diarrhea  . Codeine Hives and Rash  . Penicillins Hives and Rash   Family History  Problem Relation Age of Onset  . Hyperlipidemia Other   . Hypertension Other   . Coronary artery disease Other     several nephrew  . Heart disease Father   . Colon cancer Neg Hx   . Esophageal cancer Neg Hx   . Rectal cancer Neg Hx   . Stomach cancer Neg Hx   . Cancer Brother     ?stomach  . Heart attack Neg Hx   . Stroke Father        Past medical, surgical, family and social history reviewed. Medications reviewed all in the electronic medical record.   Review of Systems: No headache, visual changes, nausea, vomiting, diarrhea, constipation, dizziness, abdominal pain, skin rash, fevers, chills, night sweats, weight loss, swollen lymph nodes, body aches, joint swelling, muscle aches, chest pain, shortness of breath, mood changes.   Objective:    Blood pressure 94/60, pulse 72, weight 167 lb (75.751 kg), SpO2 94 %.   General: No apparent distress alert and oriented x3 mood and affect normal,  dressed appropriately. Patient does have some increased kyphosis HEENT: Pupils equal, extraocular movements intact Respiratory: Patient's speak in full sentences and does not appear short of breath clear to auscultation with minimal crackles of the basilar lungs Cardiovascular: +1 pitting edema to midcalf bilaterally improved from previous exam Skin: Warm dry intact with no signs of infection or rash on extremities or on axial skeleton. Abdomen: Soft nontender Neuro: Cranial nerves II through XII are intact, neurovascularly intact in all extremities with 2+ DTRs and 2+ pulses. Lymph: No lymphadenopathy of posterior or anterior cervical chain or axillae bilaterally.  Gait patient does walk with a cane but does have good balance  MSK: Non tender with full range of motion and good stability and symmetric strength and tone of, elbows, wrist, hip, and ankles bilaterally. Arthritic changes of multiple joints Knee is still tender to palpation over the medial lateral joint line. Significant crepitus. Mild instability with valgus force.  Shoulder: Right Inspection reveals no abnormalities, atrophy or asymmetry. Palpation is normal with no tenderness over AC joint or bicipital groove. Patient has internal range of motion  to sacrum, external range of motion of 5, forward flexion of the 140, crepitus or throughout. 4-5 strength of the rotator cuff compared to contralateral side Positive impingement Speeds and Yergason's tests normal. No labral pathology noted with negative Obrien's, negative clunk and good stability. Normal scapular function observed. No painful arc and no drop arm sign. No apprehension sign Contralateral shoulder has crepitus is well but increased range of motion and strength.     After informed written and verbal consent, patient was seated on exam table. Right shoulder was prepped with alcohol swab and utilizing posterior approach, patient's right glenohumeral space was injected  with 4:1  marcaine 0.5%: Kenalog '40mg'$ /dL. Patient tolerated the procedure well without immediate complications.     Impression and Recommendations:     This case required medical decision making of moderate complexity.

## 2015-04-21 NOTE — Progress Notes (Signed)
Pre visit review using our clinic review tool, if applicable. No additional management support is needed unless otherwise documented below in the visit note. 

## 2015-04-21 NOTE — Patient Instructions (Signed)
Good to see you  [name] is your friend Keep trucking along Can repeat knee injection again in 8 weeks if you would like.  [employer] "total support" online would be great  See me again in 2 months.

## 2015-04-21 NOTE — Assessment & Plan Note (Signed)
Patient was given an injection today and tolerated the procedure very well. We discussed continuing to try to remain active. Patient has many other medical problems. Having significant tightness of some of her muscles given a very low dose of a muscle relaxer. Warned the potential side effects and they decided they would like to have this. Continue the over-the-counter medications. Patient has tramadol for breakthrough pain. Can repeat injection every 12 weeks if necessary.

## 2015-05-26 ENCOUNTER — Encounter (HOSPITAL_COMMUNITY): Payer: Self-pay | Admitting: *Deleted

## 2015-05-26 DIAGNOSIS — E785 Hyperlipidemia, unspecified: Secondary | ICD-10-CM | POA: Diagnosis not present

## 2015-05-26 DIAGNOSIS — Z85118 Personal history of other malignant neoplasm of bronchus and lung: Secondary | ICD-10-CM | POA: Insufficient documentation

## 2015-05-26 DIAGNOSIS — Z87448 Personal history of other diseases of urinary system: Secondary | ICD-10-CM | POA: Diagnosis not present

## 2015-05-26 DIAGNOSIS — Z8669 Personal history of other diseases of the nervous system and sense organs: Secondary | ICD-10-CM | POA: Diagnosis not present

## 2015-05-26 DIAGNOSIS — F329 Major depressive disorder, single episode, unspecified: Secondary | ICD-10-CM | POA: Insufficient documentation

## 2015-05-26 DIAGNOSIS — R011 Cardiac murmur, unspecified: Secondary | ICD-10-CM | POA: Diagnosis not present

## 2015-05-26 DIAGNOSIS — I10 Essential (primary) hypertension: Secondary | ICD-10-CM | POA: Insufficient documentation

## 2015-05-26 DIAGNOSIS — M79605 Pain in left leg: Secondary | ICD-10-CM | POA: Diagnosis not present

## 2015-05-26 DIAGNOSIS — Z87442 Personal history of urinary calculi: Secondary | ICD-10-CM | POA: Insufficient documentation

## 2015-05-26 DIAGNOSIS — Z8701 Personal history of pneumonia (recurrent): Secondary | ICD-10-CM | POA: Diagnosis not present

## 2015-05-26 DIAGNOSIS — M25472 Effusion, left ankle: Secondary | ICD-10-CM | POA: Insufficient documentation

## 2015-05-26 DIAGNOSIS — Z7982 Long term (current) use of aspirin: Secondary | ICD-10-CM | POA: Diagnosis not present

## 2015-05-26 DIAGNOSIS — M7989 Other specified soft tissue disorders: Secondary | ICD-10-CM | POA: Diagnosis not present

## 2015-05-26 DIAGNOSIS — Z79899 Other long term (current) drug therapy: Secondary | ICD-10-CM | POA: Diagnosis not present

## 2015-05-26 DIAGNOSIS — Z872 Personal history of diseases of the skin and subcutaneous tissue: Secondary | ICD-10-CM | POA: Insufficient documentation

## 2015-05-26 DIAGNOSIS — J449 Chronic obstructive pulmonary disease, unspecified: Secondary | ICD-10-CM | POA: Diagnosis not present

## 2015-05-26 DIAGNOSIS — K219 Gastro-esophageal reflux disease without esophagitis: Secondary | ICD-10-CM | POA: Diagnosis not present

## 2015-05-26 DIAGNOSIS — M25572 Pain in left ankle and joints of left foot: Secondary | ICD-10-CM | POA: Diagnosis present

## 2015-05-26 DIAGNOSIS — F419 Anxiety disorder, unspecified: Secondary | ICD-10-CM | POA: Insufficient documentation

## 2015-05-26 DIAGNOSIS — Z880 Allergy status to penicillin: Secondary | ICD-10-CM | POA: Diagnosis not present

## 2015-05-26 DIAGNOSIS — M1990 Unspecified osteoarthritis, unspecified site: Secondary | ICD-10-CM | POA: Insufficient documentation

## 2015-05-26 DIAGNOSIS — Z862 Personal history of diseases of the blood and blood-forming organs and certain disorders involving the immune mechanism: Secondary | ICD-10-CM | POA: Insufficient documentation

## 2015-05-26 NOTE — ED Notes (Signed)
Pt took tylenol and prescription pain medication around 1500 without relief.

## 2015-05-26 NOTE — ED Notes (Signed)
Pt c/o left leg, left ankle and left foot pain starting this morning. Pt denies any injury or fall, CMS intact. Leg is warm and dry, tender to palpation. Pt denies shortness of breath or chest pain, pt on home O2 at 2lpm.

## 2015-05-27 ENCOUNTER — Ambulatory Visit (HOSPITAL_BASED_OUTPATIENT_CLINIC_OR_DEPARTMENT_OTHER)
Admission: RE | Admit: 2015-05-27 | Discharge: 2015-05-27 | Disposition: A | Discharge status: Home / Self Care | Payer: Medicare HMO | Source: Ambulatory Visit | Attending: Emergency Medicine | Admitting: Emergency Medicine

## 2015-05-27 ENCOUNTER — Emergency Department (HOSPITAL_COMMUNITY): Payer: Medicare HMO

## 2015-05-27 ENCOUNTER — Emergency Department (HOSPITAL_COMMUNITY)
Admission: EM | Admit: 2015-05-27 | Discharge: 2015-05-27 | Disposition: A | Discharge status: Home / Self Care | Payer: Medicare HMO | Attending: Emergency Medicine | Admitting: Emergency Medicine

## 2015-05-27 DIAGNOSIS — M7989 Other specified soft tissue disorders: Secondary | ICD-10-CM

## 2015-05-27 DIAGNOSIS — I10 Essential (primary) hypertension: Secondary | ICD-10-CM

## 2015-05-27 DIAGNOSIS — M2550 Pain in unspecified joint: Secondary | ICD-10-CM

## 2015-05-27 DIAGNOSIS — M79605 Pain in left leg: Secondary | ICD-10-CM

## 2015-05-27 DIAGNOSIS — M79609 Pain in unspecified limb: Secondary | ICD-10-CM

## 2015-05-27 DIAGNOSIS — K219 Gastro-esophageal reflux disease without esophagitis: Secondary | ICD-10-CM | POA: Insufficient documentation

## 2015-05-27 DIAGNOSIS — E785 Hyperlipidemia, unspecified: Secondary | ICD-10-CM

## 2015-05-27 LAB — CBC WITH DIFFERENTIAL/PLATELET
BASOS PCT: 0 %
Basophils Absolute: 0 10*3/uL (ref 0.0–0.1)
Eosinophils Absolute: 0 10*3/uL (ref 0.0–0.7)
Eosinophils Relative: 1 %
HEMATOCRIT: 35.7 % — AB (ref 36.0–46.0)
HEMOGLOBIN: 11.1 g/dL — AB (ref 12.0–15.0)
LYMPHS ABS: 1.2 10*3/uL (ref 0.7–4.0)
LYMPHS PCT: 14 %
MCH: 31.7 pg (ref 26.0–34.0)
MCHC: 31.1 g/dL (ref 30.0–36.0)
MCV: 102 fL — AB (ref 78.0–100.0)
MONO ABS: 0.8 10*3/uL (ref 0.1–1.0)
MONOS PCT: 9 %
NEUTROS ABS: 6.5 10*3/uL (ref 1.7–7.7)
NEUTROS PCT: 76 %
Platelets: 217 10*3/uL (ref 150–400)
RBC: 3.5 MIL/uL — ABNORMAL LOW (ref 3.87–5.11)
RDW: 13.6 % (ref 11.5–15.5)
WBC: 8.5 10*3/uL (ref 4.0–10.5)

## 2015-05-27 LAB — BASIC METABOLIC PANEL
Anion gap: 14 (ref 5–15)
BUN: 42 mg/dL — AB (ref 6–20)
CHLORIDE: 102 mmol/L (ref 101–111)
CO2: 23 mmol/L (ref 22–32)
CREATININE: 2.07 mg/dL — AB (ref 0.44–1.00)
Calcium: 9.5 mg/dL (ref 8.9–10.3)
GFR calc non Af Amer: 22 mL/min — ABNORMAL LOW (ref >60)
GFR, EST AFRICAN AMERICAN: 26 mL/min — AB (ref >60)
GLUCOSE: 113 mg/dL — AB (ref 65–99)
Potassium: 4.4 mmol/L (ref 3.5–5.1)
Sodium: 139 mmol/L (ref 135–145)

## 2015-05-27 LAB — D-DIMER, QUANTITATIVE: D-Dimer, Quant: 1.19 ug/mL-FEU — ABNORMAL HIGH (ref 0.00–0.50)

## 2015-05-27 MED ORDER — TRAMADOL HCL 50 MG PO TABS
50.0000 mg | ORAL_TABLET | Freq: Once | ORAL | Status: AC
  Administered 2015-05-27: 50 mg via ORAL
  Filled 2015-05-27: qty 1

## 2015-05-27 MED ORDER — PREDNISONE 50 MG PO TABS: 50.0000 mg | ORAL_TABLET | Freq: Every day | ORAL | Status: DC

## 2015-05-27 MED ORDER — ONDANSETRON 4 MG PO TBDP
8.0000 mg | ORAL_TABLET | Freq: Once | ORAL | Status: AC
  Administered 2015-05-27: 8 mg via ORAL
  Filled 2015-05-27: qty 2

## 2015-05-27 NOTE — ED Notes (Signed)
Pt to vascular.

## 2015-05-27 NOTE — ED Provider Notes (Signed)
9:55 AM pt signed out to me by Dr. Betsey Holiday pending DVT study.  She has no evidence of DVT or other abnormality on that exam.  Plan per Dr. Betsey Holiday will give rx for prednisone to help with joint pain.    Alfonzo Beers, MD 05/27/15 [phone]

## 2015-05-27 NOTE — Discharge Instructions (Signed)
Return to the ED with any concerns including increased swelling of foot or leg, difficulty breathing or chest pain, fever/chills, decreased level of alertness/lethargy, or any other alarming symptoms  The DVT study was negative, there was no blood clot found

## 2015-05-27 NOTE — Progress Notes (Signed)
VASCULAR LAB PRELIMINARY  PRELIMINARY  PRELIMINARY  PRELIMINARY  Left lower extremity venous duplex completed.     Left:  No evidence of DVT, superficial thrombosis, or Baker's cyst.   Janifer Adie, RVT, RDMS 05/27/2015, 9:04 AM

## 2015-05-27 NOTE — ED Notes (Signed)
Patient transported to X-ray 

## 2015-05-27 NOTE — ED Provider Notes (Signed)
CSN: [id number]     Arrival date & time 05/26/15  2045 History  By signing my name below, I, [name], attest that this documentation has been prepared under the direction and in the presence of Jody Greek, MD . Electronically Signed: Evelene [name], Scribe. 05/27/2015. 2:32 AM.    Chief Complaint  Patient presents with  . Leg Pain  . Ankle Pain  . Foot Pain   The history is provided by the patient. No language interpreter was used.     HPI Comments:  [name] is a 75 y.o. female who presents to the Emergency Department complaining of moderate left ankle, left leg and left foot pain since this AM. She has applied ice and taken tylenol with little relief. Pt normally ambulates with cane and walker. Pt has no other complaints or symptoms at this time.    Past Medical History  Diagnosis Date  . HYPERLIPIDEMIA 11/18/2006  . HYPERKALEMIA 02/05/2008  . ANEMIA-IRON DEFICIENCY 09/22/2008  . Anemia of other chronic disease 02/05/2008  . ANXIETY 06/03/2008  . DEPRESSION 04/28/2009  . HYPERTENSION 11/18/2006  . PERICARDITIS 01/03/2007  . AORTIC STENOSIS/ INSUFFICIENCY, NON-RHEUMATIC 12/03/2008  . SINUSITIS- ACUTE-NOS 11/20/2007  . SINUSITIS, CHRONIC 02/05/2008  . ALLERGIC RHINITIS 11/18/2006  . PNEUMONIA 05/02/2007  . C O P D 06/27/2008  . Stricture and stenosis of esophagus 11/14/2008  . GERD 01/03/2007  . BARRETTS ESOPHAGUS 11/14/2008  . DYSPEPSIA 03/23/2007  . PRURITUS 10/08/2008  . OSTEOARTHRITIS 11/18/2006  . OSTEOARTHRITIS, KNEE, LEFT 01/03/2007  . Cervicalgia 01/13/2009  . BACK PAIN 02/04/2009    is improved  . BURSITIS, LEFT HIP 04/18/2009  . OSTEOPOROSIS 11/18/2006  . INSOMNIA-SLEEP DISORDER-UNSPEC 06/03/2008  . FATIGUE 04/18/2009  . PERIPHERAL EDEMA 02/13/2009  . MURMUR 11/18/2006  . DYSPHAGIA UNSPECIFIED 03/23/2007  . Abdominal pain, generalized 03/01/2007  . Nonspecific (abnormal) findings on radiological and other examination of body structure 06/03/2008  . Tuberculin  Test Reaction 11/18/2006  . Acute bronchitis 02/23/2010  . CHOLELITHIASIS 04/24/2010  . NEPHROLITHIASIS, HX OF 04/24/2010  . Polyarthralgia 06/09/2010  . Elevated sed rate 06/11/2010  . Positive ANA (antinuclear antibody) 06/11/2010  . Rheumatoid factor positive 06/11/2010  . Cancer ([place])     lung ca  . 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 SPONDYLOSIS, CERVICAL, WITH RADICULOPATHY 01/13/2009  . Cervical radiculopathy 09/28/2010  . Lumbar radiculopathy 09/28/2010  . RENAL INSUFFICIENCY 02/05/2008    function has improved.  . Transfusion history 09-01-12    4'14 tranfused x 2 units  . Bleeding nose 09-01-12    past hx. none recent after having surgery  . Wound healing, delayed 09-01-12    right abdominal area   Past Surgical History  Procedure Laterality Date  . Abdominal hysterectomy    . Tubal ligation    . Tumor removal  08/06/08    from lungs  . Anterior cervical decomp/discectomy fusion  01/15/2011    Procedure: ANTERIOR CERVICAL DECOMPRESSION/DISCECTOMY FUSION 2 LEVELS;  Surgeon: Cooper Render Pool;  Location: [facility];  Service: Neurosurgery;  Laterality: N/A;  Cervical Three-Four, Cervical Four-Five Anterior Cervical Decompression Fusion   . Colonscopy    . Nose surgery    . Laparoscopic appendectomy N/A 05/25/2012    Procedure: APPENDECTOMY LAPAROSCOPIC  CONVERTED TO  OPEN APPENDECTOMY, exploratory laparatomy;  Surgeon: Odis Hollingshead, MD;  Location: [facility];  Service: General;  Laterality: N/A;  . Bowel resection N/A 05/25/2012    Procedure: SMALL BOWEL RESECTION;  Surgeon: Odis Hollingshead,  MD;  Location: [facility];  Service: General;  Laterality: N/A;  . Laparoscopic lysis of adhesions N/A 05/25/2012    Procedure: LAPAROSCOPIC LYSIS OF ADHESIONS;  Surgeon: Odis Hollingshead, MD;  Location: [facility];  Service: General;  Laterality: N/A;  . Appendectomy    . Cataract extraction, bilateral  09-01-12  . Debridement of abdominal wall abscess N/A 09/07/2012    Procedure:  ABDOMINAL WOUND  IRRIGATION AND TWO LAYERED CLOSURE;  Surgeon: Odis Hollingshead, MD;  Location: [facility];  Service: General;  Laterality: N/A;   Family History  Problem Relation Age of Onset  . Hyperlipidemia Other   . Hypertension Other   . Coronary artery disease Other     several nephrew  . Heart disease Father   . Colon cancer Neg Hx   . Esophageal cancer Neg Hx   . Rectal cancer Neg Hx   . Stomach cancer Neg Hx   . Cancer Brother     ?stomach  . Heart attack Neg Hx   . Stroke Father    Social History  Substance Use Topics  . Smoking status: Former Smoker -- 1.50 packs/day for 40 years    Types: Cigarettes    Quit date: 02/15/2006  . Smokeless tobacco: Former Systems developer  . Alcohol Use: No   OB History    No data available     Review of Systems  Musculoskeletal: Positive for myalgias and arthralgias.  Neurological: Negative for weakness and numbness.  All other systems reviewed and are negative.   Allergies  Amoxicillin; Fentanyl; Hydrocodone; Lactose intolerance (gi); Codeine; and Penicillins  Home Medications   Prior to Admission medications   Medication Sig Start Date End Date Taking? Authorizing Provider  acetaminophen (TYLENOL) 500 MG tablet Take 1,000 mg by mouth every 6 (six) hours as needed (pain).    Historical Provider, MD  alendronate (FOSAMAX) 70 MG tablet Take 1 tablet (70 mg total) by mouth every 7 (seven) days. Take with a full glass of water on an empty stomach. Patient takes on Mondays. 11/06/14   Biagio Borg, MD  aspirin 325 MG tablet Take 325 mg by mouth daily.    Historical Provider, MD  Calcium Citrate-Vitamin D (CALCIUM + D PO) Take 1 tablet by mouth daily.    Historical Provider, MD  citalopram (CELEXA) 10 MG tablet Take 1 tablet (10 mg total) by mouth daily. 04/18/15   Biagio Borg, MD  FIBER SELECT GUMMIES CHEW Chew 2 tablets by mouth every other day.     Historical Provider, MD  furosemide (LASIX) 40 MG tablet Take 2 tablets (80 mg total) by mouth 2 (two) times  daily. 01/03/15   Biagio Borg, MD  KLOR-CON M20 20 MEQ tablet TAKE 1 TABLET BY MOUTH DAILY 01/30/15   Biagio Borg, MD  metoprolol succinate (TOPROL-XL) 50 MG 24 hr tablet Take 1 tablet (50 mg total) by mouth daily. Take with or immediately following a meal. 11/06/14   Biagio Borg, MD  pantoprazole (PROTONIX) 40 MG tablet TAKE 1 TABLET (40 MG TOTAL) BY MOUTH DAILY. 08/14/14   Biagio Borg, MD  pravastatin (PRAVACHOL) 40 MG tablet TAKE 1 TABLET BY MOUTH EVERY DAY 12/09/14   Biagio Borg, MD  tamsulosin [facility]) 0.4 MG CAPS capsule TAKE ONE CAPSULE BY MOUTH EVERY DAY 12/16/14   Biagio Borg, MD  tizanidine (ZANAFLEX) 2 MG capsule Take 1 capsule (2 mg total) by mouth at bedtime as needed for muscle spasms. 04/21/15  Lyndal Pulley, DO  traMADol (ULTRAM) 50 MG tablet TAKE 1 TABLET EVERY 8 HOURS AS NEEDED 01/03/15   Biagio Borg, MD   BP 125/50 mmHg  Pulse 66  Temp(Src) 98.6 F (37 C) (Oral)  Resp 16  SpO2 100% Physical Exam  Constitutional: She is oriented to person, place, and time. She appears well-developed and well-nourished. No distress.  HENT:  Head: Normocephalic and atraumatic.  Right Ear: Hearing normal.  Left Ear: Hearing normal.  Nose: Nose normal.  Mouth/Throat: Oropharynx is clear and moist and mucous membranes are normal.  Eyes: Conjunctivae and EOM are normal. Pupils are equal, round, and reactive to light.  Neck: Normal range of motion. Neck supple.  Cardiovascular: Regular rhythm, S1 normal and S2 normal.  Exam reveals no gallop and no friction rub.   No murmur heard. Pulmonary/Chest: Effort normal and breath sounds normal. No respiratory distress. She exhibits no tenderness.  Abdominal: Soft. Normal appearance and bowel sounds are normal. There is no hepatosplenomegaly. There is no tenderness. There is no rebound, no guarding, no tenderness at McBurney's point and negative Murphy's sign. No hernia.  Musculoskeletal: Normal range of motion. She exhibits tenderness.        Left ankle: She exhibits swelling.       Left lower leg: She exhibits swelling.       Left foot: There is swelling.  Diffuse LLE tenderness  Neurological: She is alert and oriented to person, place, and time. She has normal strength. No cranial nerve deficit or sensory deficit. Coordination normal. GCS eye subscore is 4. GCS verbal subscore is 5. GCS motor subscore is 6.  Skin: Skin is warm, dry and intact. No rash noted. No cyanosis.  Psychiatric: She has a normal mood and affect. Her speech is normal and behavior is normal. Thought content normal.  Nursing note and vitals reviewed.   ED Course  Procedures   DIAGNOSTIC STUDIES:  Oxygen Saturation is 100% on RA, normal by my interpretation.    COORDINATION OF CARE:  2:35 AM Discussed treatment plan with pt at bedside and pt agreed to plan.  Labs Review Labs Reviewed - No data to display  Imaging Review No results found. I have personally reviewed and evaluated these images and lab results as part of my medical decision-making.   EKG Interpretation None      MDM   Final diagnoses:  None  arthralgia  She presents to the emergency department for evaluation of left leg pain. Patient reports that her entire left leg began hurting earlier that morning and progressively worsened through the day. She does have a history of arthritis and takes Ultram for pain. She took Ultram and Tylenol without relief of her pain. Patient denies any direct injury. Examination revealed swelling without pitting edema of the lower leg, ankle and foot. There is no erythema of the ankle or joints. X-ray of hip, knee, ankle were performed and are negative for acute pathology. Lab work was normal. Patient is afebrile. There is nothing to suggest septic joint in this patient. With the diffuse lower extremity swelling, DVT is considered. Will perform venous duplex. Gout being no consideration, as the swelling appears to be centered around the area of the left  ankle. Will sign out to oncoming ER physician. Follow-up on duplex, if negative discharge with analgesia and follow-up with PCP.  I personally performed the services described in this documentation, which was scribed in my presence. The recorded information has been reviewed and is accurate.  Jody Greek, MD 05/27/15 [id number]

## 2015-06-03 ENCOUNTER — Ambulatory Visit (INDEPENDENT_AMBULATORY_CARE_PROVIDER_SITE_OTHER): Payer: Medicare HMO | Admitting: Internal Medicine

## 2015-06-03 ENCOUNTER — Encounter: Payer: Self-pay | Admitting: Internal Medicine

## 2015-06-03 VITALS — BP 108/62 | HR 79 | Temp 98.0°F | Resp 20 | Wt 165.0 lb

## 2015-06-03 DIAGNOSIS — M79605 Pain in left leg: Secondary | ICD-10-CM | POA: Diagnosis not present

## 2015-06-03 DIAGNOSIS — R269 Unspecified abnormalities of gait and mobility: Secondary | ICD-10-CM | POA: Diagnosis not present

## 2015-06-03 DIAGNOSIS — J449 Chronic obstructive pulmonary disease, unspecified: Secondary | ICD-10-CM

## 2015-06-03 DIAGNOSIS — J9611 Chronic respiratory failure with hypoxia: Secondary | ICD-10-CM

## 2015-06-03 MED ORDER — HYDROCODONE-ACETAMINOPHEN 5-325 MG PO TABS: 1.0000 | ORAL_TABLET | Freq: Four times a day (QID) | ORAL | Status: DC | PRN

## 2015-06-03 MED ORDER — OXYCODONE-ACETAMINOPHEN 5-325 MG PO TABS: 1.0000 | ORAL_TABLET | Freq: Three times a day (TID) | ORAL | Status: AC | PRN

## 2015-06-03 NOTE — Progress Notes (Signed)
Subjective:    Patient ID: [name], female    DOB: 04-20-1940, 75 y.o.   MRN: [id number]  HPI  Here with son to f/u, needs verbage on the record to cont to qualify for home o2 at $300/mo. :Pt Still continuing to use Home o2, still benefitting from the use, and using a portable in the home.  Pt denies chest pain, increased sob or doe, wheezing, orthopnea, PND, increased LE swelling, palpitations, dizziness or syncope. Pt denies new neurological symptoms such as new headache, or facial or extremity weakness or numbness   Pt denies polydipsia, polyuria, Does 2-3 wks recent worsening left leg pain, primarily distal posterior and point to achilles area, intermittent, severe, sharp, much worse to ambulate and simply cannot get around the house.  Has not yet been seen for evaluation for this new problem. Buying wheelchair outright would be over $300 and cannot afford.  Tramadol not working.  No lower back pain or falls.  Knows she should not stay in wheelchair permanently.   Pt denies fever, wt loss, night sweats, loss of appetite, or other constitutional symptoms Past Medical History  Diagnosis Date  . HYPERLIPIDEMIA 11/18/2006  . HYPERKALEMIA 02/05/2008  . ANEMIA-IRON DEFICIENCY 09/22/2008  . Anemia of other chronic disease 02/05/2008  . ANXIETY 06/03/2008  . DEPRESSION 04/28/2009  . HYPERTENSION 11/18/2006  . PERICARDITIS 01/03/2007  . AORTIC STENOSIS/ INSUFFICIENCY, NON-RHEUMATIC 12/03/2008  . SINUSITIS- ACUTE-NOS 11/20/2007  . SINUSITIS, CHRONIC 02/05/2008  . ALLERGIC RHINITIS 11/18/2006  . PNEUMONIA 05/02/2007  . C O P D 06/27/2008  . Stricture and stenosis of esophagus 11/14/2008  . GERD 01/03/2007  . BARRETTS ESOPHAGUS 11/14/2008  . DYSPEPSIA 03/23/2007  . PRURITUS 10/08/2008  . OSTEOARTHRITIS 11/18/2006  . OSTEOARTHRITIS, KNEE, LEFT 01/03/2007  . Cervicalgia 01/13/2009  . BACK PAIN 02/04/2009    is improved  . BURSITIS, LEFT HIP 04/18/2009  . OSTEOPOROSIS 11/18/2006  . INSOMNIA-SLEEP  DISORDER-UNSPEC 06/03/2008  . FATIGUE 04/18/2009  . PERIPHERAL EDEMA 02/13/2009  . MURMUR 11/18/2006  . DYSPHAGIA UNSPECIFIED 03/23/2007  . Abdominal pain, generalized 03/01/2007  . Nonspecific (abnormal) findings on radiological and other examination of body structure 06/03/2008  . Tuberculin Test Reaction 11/18/2006  . Acute bronchitis 02/23/2010  . CHOLELITHIASIS 04/24/2010  . NEPHROLITHIASIS, HX OF 04/24/2010  . Polyarthralgia 06/09/2010  . Elevated sed rate 06/11/2010  . Positive ANA (antinuclear antibody) 06/11/2010  . Rheumatoid factor positive 06/11/2010  . Cancer ([place])     lung ca  . 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 SPONDYLOSIS, CERVICAL, WITH RADICULOPATHY 01/13/2009  . Cervical radiculopathy 09/28/2010  . Lumbar radiculopathy 09/28/2010  . RENAL INSUFFICIENCY 02/05/2008    function has improved.  . Transfusion history 09-01-12    4'14 tranfused x 2 units  . Bleeding nose 09-01-12    past hx. none recent after having surgery  . Wound healing, delayed 09-01-12    right abdominal area   Past Surgical History  Procedure Laterality Date  . Abdominal hysterectomy    . Tubal ligation    . Tumor removal  08/06/08    from lungs  . Anterior cervical decomp/discectomy fusion  01/15/2011    Procedure: ANTERIOR CERVICAL DECOMPRESSION/DISCECTOMY FUSION 2 LEVELS;  Surgeon: Cooper Render Pool;  Location: [facility];  Service: Neurosurgery;  Laterality: N/A;  Cervical Three-Four, Cervical Four-Five Anterior Cervical Decompression Fusion   . Colonscopy    . Nose surgery    . Laparoscopic appendectomy N/A 05/25/2012    Procedure: APPENDECTOMY LAPAROSCOPIC  CONVERTED TO  OPEN APPENDECTOMY, exploratory laparatomy;  Surgeon: Odis Hollingshead, MD;  Location: [facility];  Service: General;  Laterality: N/A;  . Bowel resection N/A 05/25/2012    Procedure: SMALL BOWEL RESECTION;  Surgeon: Odis Hollingshead, MD;  Location: [facility];  Service: General;  Laterality: N/A;  . Laparoscopic lysis of adhesions N/A  05/25/2012    Procedure: LAPAROSCOPIC LYSIS OF ADHESIONS;  Surgeon: Odis Hollingshead, MD;  Location: [facility];  Service: General;  Laterality: N/A;  . Appendectomy    . Cataract extraction, bilateral  09-01-12  . Debridement of abdominal wall abscess N/A 09/07/2012    Procedure:  ABDOMINAL WOUND IRRIGATION AND TWO LAYERED CLOSURE;  Surgeon: Odis Hollingshead, MD;  Location: [facility];  Service: General;  Laterality: N/A;    reports that she quit smoking about 9 years ago. Her smoking use included Cigarettes. She has a 60 pack-year smoking history. She has quit using smokeless tobacco. She reports that she does not drink alcohol or use illicit drugs. family history includes Cancer in her brother; Coronary artery disease in her other; Heart disease in her father; Hyperlipidemia in her other; Hypertension in her other; Stroke in her father. There is no history of Colon cancer, Esophageal cancer, Rectal cancer, Stomach cancer, or Heart attack. Allergies  Allergen Reactions  . Amoxicillin Itching    Has patient had a PCN reaction causing immediate rash, facial/tongue/throat swelling, SOB or lightheadedness with hypotension: No Has patient had a PCN reaction causing severe rash involving mucus membranes or skin necrosis: No Has patient had a PCN reaction that required hospitalization No Has patient had a PCN reaction occurring within the last 10 years: No If all of the above answers are "NO", then may proceed with Cephalosporin use.   . Fentanyl Other (See Comments)    hallucinations  . Hydrocodone Other (See Comments)    ineffective  . Lactose Intolerance (Gi) Diarrhea  . Codeine Hives and Rash  . Penicillins Hives and Rash   Current Outpatient Prescriptions on File Prior to Visit  Medication Sig Dispense Refill  . acetaminophen (TYLENOL) 500 MG tablet Take 1,000 mg by mouth every 6 (six) hours as needed (pain).    Marland Kitchen alendronate (FOSAMAX) 70 MG tablet Take 1 tablet (70 mg total) by mouth every 7  (seven) days. Take with a full glass of water on an empty stomach. Patient takes on Mondays. 4 tablet 11  . aspirin 325 MG tablet Take 325 mg by mouth daily.    . Calcium Citrate-Vitamin D (CALCIUM + D PO) Take 1 tablet by mouth daily.    . citalopram (CELEXA) 10 MG tablet Take 1 tablet (10 mg total) by mouth daily. 90 tablet 2  . FIBER SELECT GUMMIES CHEW Chew 2 tablets by mouth every other day.     . furosemide (LASIX) 40 MG tablet Take 2 tablets (80 mg total) by mouth 2 (two) times daily. 120 tablet 5  . KLOR-CON M20 20 MEQ tablet TAKE 1 TABLET BY MOUTH DAILY 30 tablet 5  . metoprolol succinate (TOPROL-XL) 50 MG 24 hr tablet Take 1 tablet (50 mg total) by mouth daily. Take with or immediately following a meal. 90 tablet 3  . pantoprazole (PROTONIX) 40 MG tablet TAKE 1 TABLET (40 MG TOTAL) BY MOUTH DAILY. 90 tablet 3  . pravastatin (PRAVACHOL) 40 MG tablet TAKE 1 TABLET BY MOUTH EVERY DAY 90 tablet 3  . predniSONE (DELTASONE) 50 MG tablet Take 1 tablet (50 mg total) by  mouth daily. 5 tablet 0  . tamsulosin (FLOMAX) 0.4 MG CAPS capsule TAKE ONE CAPSULE BY MOUTH EVERY DAY 30 capsule 5  . tizanidine (ZANAFLEX) 2 MG capsule Take 1 capsule (2 mg total) by mouth at bedtime as needed for muscle spasms. 30 capsule 1  . traMADol (ULTRAM) 50 MG tablet TAKE 1 TABLET EVERY 8 HOURS AS NEEDED 90 tablet 2   No current facility-administered medications on file prior to visit.     Review of Systems  Constitutional: Negative for unusual diaphoresis or night sweats HENT: Negative for ear swelling or discharge Eyes: Negative for worsening visual haziness  Respiratory: Negative for choking and stridor.   Gastrointestinal: Negative for distension or worsening eructation Genitourinary: Negative for retention or change in urine volume.  Musculoskeletal: Negative for other MSK pain or swelling Skin: Negative for color change and worsening wound Neurological: Negative for tremors and numbness other than noted    Psychiatric/Behavioral: Negative for decreased concentration or agitation other than above       Objective:   Physical Exam BP 108/62 mmHg  Pulse 79  Temp(Src) 98 F (36.7 C) (Oral)  Resp 20  Wt 165 lb (74.844 kg)  SpO2 98% VS noted,  Constitutional: Pt appears in no apparent distress HENT: Head: NCAT.  Right Ear: External ear normal.  Left Ear: External ear normal.  Eyes: . Pupils are equal, round, and reactive to light. Conjunctivae and EOM are normal Neck: Normal range of motion. Neck supple.  Cardiovascular: Normal rate and regular rhythm.   Pulmonary/Chest: Effort normal and breath sounds without rales or wheezing.  Abd:  Soft, NT, ND, + BS Neurological: Pt is alert. Not confused , motor 5/5 intact, gait unsteady and antalgic Skin: Skin is warm. No rash, no LE edema Psychiatric: Pt behavior is normal. No agitation.  LLE with tender left achilles and calf but no significant swelling, erythema, skin change or rash or ulcer., o/w neurovasc intact    Assessment & Plan:

## 2015-06-03 NOTE — Patient Instructions (Addendum)
[place] for the wheelchair prescription  Please take all new medication as prescribed - the pain medication  Please see Dr Tamala Julian in this office for further evaluation and treatment of the leg  Please continue all other medications as before, including the Home o2  Please have the pharmacy call with any other refills you may need.  Please keep your appointments with your specialists as you may have planned

## 2015-06-03 NOTE — Progress Notes (Signed)
Pre visit review using our clinic review tool, if applicable. No additional management support is needed unless otherwise documented below in the visit note. 

## 2015-06-04 NOTE — Assessment & Plan Note (Addendum)
Worsening recently in the setting of mult serious comorbids including chronic hypoxic resp failure, copd afib diast chf, puolm HTN, OA, and lung ca.  OK for wheelchair, but hopefully may not have to be essentially wheelchair bound, declines PT for now given pain.  to f/u any worsening symptoms or concerns

## 2015-06-04 NOTE — Assessment & Plan Note (Signed)
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

## 2015-06-04 NOTE — Assessment & Plan Note (Signed)
stable overall by history and exam, recent data reviewed with pt, and pt to continue medical treatment as before,  to f/u any worsening symptoms or concerns SpO2 Readings from Last 3 Encounters:  06/03/15 98%  05/27/15 99%  04/21/15 94%

## 2015-06-04 NOTE — Assessment & Plan Note (Signed)
With persistent fatigue and sob with ambulation, o2 sat stable on current home o2, to continue this

## 2015-06-12 ENCOUNTER — Telehealth: Payer: Self-pay | Admitting: Internal Medicine

## 2015-06-12 NOTE — Telephone Encounter (Signed)
Levada Dy from [facility] states that 2 forms were sent here for Dr. Jenny Reichmann to sign. She states the orders were not. Can you please have Dr. Jenny Reichmann sign and fax back to her at [phone] Or give her a call at [phone]

## 2015-06-18 ENCOUNTER — Telehealth: Payer: Self-pay

## 2015-06-18 NOTE — Telephone Encounter (Signed)
For some reason DME requests are always a challenge to get done  I believe this has been addressed  Corinne to contact [facility], I believe forms were signed.  Does pt need anything else done?  I will not guess as to what pt needs; if I can be told exactly what she needs I can do this  O/w I will send to PT for evaluation for approp DME needs

## 2015-06-18 NOTE — Telephone Encounter (Signed)
Please advise, would we write a prescription for this?

## 2015-06-18 NOTE — Telephone Encounter (Signed)
Patient called and wanted you to call [facility] about getting her a wheelchair. PLease follow up

## 2015-06-18 NOTE — Telephone Encounter (Signed)
Unable to reach patient, also unable to leave voicemail. Called patient to see exactly what she needed before we call [facility]. Will call [facility] once we know exactly what patient is needing to do prescription

## 2015-06-19 ENCOUNTER — Ambulatory Visit (INDEPENDENT_AMBULATORY_CARE_PROVIDER_SITE_OTHER): Payer: Medicare HMO | Admitting: Family Medicine

## 2015-06-19 ENCOUNTER — Encounter: Payer: Self-pay | Admitting: Family Medicine

## 2015-06-19 VITALS — BP 106/64 | HR 72

## 2015-06-19 DIAGNOSIS — R609 Edema, unspecified: Secondary | ICD-10-CM

## 2015-06-19 NOTE — Patient Instructions (Addendum)
Good to see you  I think the swelling is from the heart at this time.  If any chance the compresison socks could be good but I understand the struggle.  For now increase furosomide to 2 pills in morning and 1 pill at night If any chest pain please go t to the emergency room.  Once you get this sorted out and if the knee still hurt then we can consider injections again but not now.   Monday the 8th at 2pm with [place] PA.

## 2015-06-19 NOTE — Progress Notes (Signed)
Followup  Knee and ankle pain bilaterally  HPI: Patient is a 75 year old female with significant comorbidities and multiple medical problems coming in with followup knee and ankle pain. Patient states that it is bilateral.had worsening symptoms and was rushed to the emergency department pack 05/27/2015. Dopplers were negative. Patient was then follow-up with primary care provider. Saw him 2 weeks ago. Felt that it could be muscular skeletal and sent here for further evaluation. Patient has known severe osteoarthritic changes of the knee. Patient states though that the swelling in the legs and ankle seem to be worse. Giving her more difficulty. Somewhat pain that she does not want to walk. When asked patient is having some increasing and shortness of breath. Denies any chest pain or chest heaviness. States that she does any type of activity she has to increase her oxygen level recently. Denies any significant numbness and more just significant pain in the feet. Patient states that she gets her legs up in some of the swelling gets better the pain seems to get better as well. No recent injuries.  Past Medical History  Diagnosis Date  . HYPERLIPIDEMIA 11/18/2006  . HYPERKALEMIA 02/05/2008  . ANEMIA-IRON DEFICIENCY 09/22/2008  . Anemia of other chronic disease 02/05/2008  . ANXIETY 06/03/2008  . DEPRESSION 04/28/2009  . HYPERTENSION 11/18/2006  . PERICARDITIS 01/03/2007  . AORTIC STENOSIS/ INSUFFICIENCY, NON-RHEUMATIC 12/03/2008  . SINUSITIS- ACUTE-NOS 11/20/2007  . SINUSITIS, CHRONIC 02/05/2008  . ALLERGIC RHINITIS 11/18/2006  . PNEUMONIA 05/02/2007  . C O P D 06/27/2008  . Stricture and stenosis of esophagus 11/14/2008  . GERD 01/03/2007  . BARRETTS ESOPHAGUS 11/14/2008  . DYSPEPSIA 03/23/2007  . PRURITUS 10/08/2008  . OSTEOARTHRITIS 11/18/2006  . OSTEOARTHRITIS, KNEE, LEFT 01/03/2007  . Cervicalgia 01/13/2009  . BACK PAIN 02/04/2009    is improved  . BURSITIS, LEFT HIP 04/18/2009  . OSTEOPOROSIS  11/18/2006  . INSOMNIA-SLEEP DISORDER-UNSPEC 06/03/2008  . FATIGUE 04/18/2009  . PERIPHERAL EDEMA 02/13/2009  . MURMUR 11/18/2006  . DYSPHAGIA UNSPECIFIED 03/23/2007  . Abdominal pain, generalized 03/01/2007  . Nonspecific (abnormal) findings on radiological and other examination of body structure 06/03/2008  . Tuberculin Test Reaction 11/18/2006  . Acute bronchitis 02/23/2010  . CHOLELITHIASIS 04/24/2010  . NEPHROLITHIASIS, HX OF 04/24/2010  . Polyarthralgia 06/09/2010  . Elevated sed rate 06/11/2010  . Positive ANA (antinuclear antibody) 06/11/2010  . Rheumatoid factor positive 06/11/2010  . Cancer ([place])     lung ca  . 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 SPONDYLOSIS, CERVICAL, WITH RADICULOPATHY 01/13/2009  . Cervical radiculopathy 09/28/2010  . Lumbar radiculopathy 09/28/2010  . RENAL INSUFFICIENCY 02/05/2008    function has improved.  . Transfusion history 09-01-12    4'14 tranfused x 2 units  . Bleeding nose 09-01-12    past hx. none recent after having surgery  . Wound healing, delayed 09-01-12    right abdominal area   Past Surgical History  Procedure Laterality Date  . Abdominal hysterectomy    . Tubal ligation    . Tumor removal  08/06/08    from lungs  . Anterior cervical decomp/discectomy fusion  01/15/2011    Procedure: ANTERIOR CERVICAL DECOMPRESSION/DISCECTOMY FUSION 2 LEVELS;  Surgeon: Cooper Render Pool;  Location: [facility];  Service: Neurosurgery;  Laterality: N/A;  Cervical Three-Four, Cervical Four-Five Anterior Cervical Decompression Fusion   . Colonscopy    . Nose surgery    . Laparoscopic appendectomy N/A 05/25/2012    Procedure: APPENDECTOMY LAPAROSCOPIC  CONVERTED TO  OPEN APPENDECTOMY, exploratory laparatomy;  Surgeon: Odis Hollingshead, MD;  Location: [facility];  Service: General;  Laterality: N/A;  . Bowel resection N/A 05/25/2012    Procedure: SMALL BOWEL RESECTION;  Surgeon: Odis Hollingshead, MD;  Location: [facility];  Service: General;  Laterality: N/A;  . Laparoscopic  lysis of adhesions N/A 05/25/2012    Procedure: LAPAROSCOPIC LYSIS OF ADHESIONS;  Surgeon: Odis Hollingshead, MD;  Location: [facility];  Service: General;  Laterality: N/A;  . Appendectomy    . Cataract extraction, bilateral  09-01-12  . Debridement of abdominal wall abscess N/A 09/07/2012    Procedure:  ABDOMINAL WOUND IRRIGATION AND TWO LAYERED CLOSURE;  Surgeon: Odis Hollingshead, MD;  Location: [facility];  Service: General;  Laterality: N/A;   Social History  Substance Use Topics  . Smoking status: Former Smoker -- 1.50 packs/day for 40 years    Types: Cigarettes    Quit date: 02/15/2006  . Smokeless tobacco: Former Systems developer  . Alcohol Use: No   Allergies  Allergen Reactions  . Amoxicillin Itching    Has patient had a PCN reaction causing immediate rash, facial/tongue/throat swelling, SOB or lightheadedness with hypotension: No Has patient had a PCN reaction causing severe rash involving mucus membranes or skin necrosis: No Has patient had a PCN reaction that required hospitalization No Has patient had a PCN reaction occurring within the last 10 years: No If all of the above answers are "NO", then may proceed with Cephalosporin use.   . Fentanyl Other (See Comments)    hallucinations  . Hydrocodone Other (See Comments)    ineffective  . Lactose Intolerance (Gi) Diarrhea  . Codeine Hives and Rash  . Penicillins Hives and Rash   Family History  Problem Relation Age of Onset  . Hyperlipidemia Other   . Hypertension Other   . Coronary artery disease Other     several nephrew  . Heart disease Father   . Colon cancer Neg Hx   . Esophageal cancer Neg Hx   . Rectal cancer Neg Hx   . Stomach cancer Neg Hx   . Cancer Brother     ?stomach  . Heart attack Neg Hx   . Stroke Father        Past medical, surgical, family and social history reviewed. Medications reviewed all in the electronic medical record.   Review of Systems: No headache, visual changes, nausea, vomiting, diarrhea,  constipation, dizziness, abdominal pain, skin rash, fevers, chills, night sweats, weight loss, swollen lymph nodes, body aches, joint swelling, muscle aches, chest pain, shortness of breath, mood changes.   Objective:    Blood pressure 106/64, pulse 72.   General: No apparent distress alert and oriented x3 mood and affect normal, dressed appropriately. Patient does have some increased kyphosis HEENT: Pupils equal, extraocular movements intact Respiratory: Patient's speak in full sentences and does not appear short of breath clear to auscultation with moderatecrackles of the basilar lungs Cardiovascular: +2 pitting edema to midthigh bilaterally significantly worse than previous exam patient is wearing oxygen and does seem mildly short of breath Skin: Warm dry intact with no signs of infection or rash on extremities or on axial skeleton. Abdomen: Soft nontender Neuro: Cranial nerves II through XII are intact, neurovascularly intact in all extremities with 2+ DTRs and 2+ pulses. Lymph: No lymphadenopathy of posterior or anterior cervical chain or axillae bilaterally.  Gait patient does walk with a cane but does have good balance  MSK: Non tender with full range of motion and good stability  and symmetric strength and tone of, elbows, wrist, hip, and ankles bilaterally. Arthritic changes of multiple joints Knee is still tender to palpation over the medial lateral joint line. Significant crepitus. Mild instability with valgus force. 2+ pain edema although he passed the knees at this moment. Tender to palpation generalized. Neurovascularly intact distally.      Impression and Recommendations:     This case required medical decision making of moderate complexity.

## 2015-06-19 NOTE — Assessment & Plan Note (Addendum)
Dependent edema noted. I do believe that this is likely secondary to congestive heart failure. Patient is also increase in her oxygen recently. We did set up an appointment with her with cardiology in the next 3 days. Patient is worsening shortness of breath or chest pain to seek medical attention immediately. Increases patient's Lasix. Discussed with her that she has prescribed 80 mg 2 times daily but she is only taking 40 mg 2 times daily. We will increase her to 80 mg in morning and 40 mg at night secondary to patient's chronic kidney disease and if need to go up further will let cardiology increased. We discussed though if no significant improvement in 3 days it would be fine for her to increase to 80 mg 2 times daily. We did discuss also about possibly labs to further evaluate for congestive heart failure.patient declined and at the moment. Worsening symptoms I would get a BMP as well as edema to make sure patient's creatinine level stay normal  Spent  25 minutes with patient face-to-face and had greater than 50% of counseling including as described above in assessment and plan.

## 2015-06-23 ENCOUNTER — Ambulatory Visit: Payer: Medicare HMO | Admitting: Cardiology

## 2015-06-26 ENCOUNTER — Ambulatory Visit: Payer: Medicare HMO | Admitting: Nurse Practitioner

## 2015-06-29 ENCOUNTER — Other Ambulatory Visit: Payer: Self-pay | Admitting: Internal Medicine

## 2015-06-30 ENCOUNTER — Other Ambulatory Visit: Payer: Self-pay | Admitting: Family Medicine

## 2015-07-01 ENCOUNTER — Ambulatory Visit (INDEPENDENT_AMBULATORY_CARE_PROVIDER_SITE_OTHER): Payer: Medicare HMO | Admitting: Physician Assistant

## 2015-07-01 ENCOUNTER — Encounter: Payer: Self-pay | Admitting: Physician Assistant

## 2015-07-01 VITALS — BP 130/60 | HR 70 | Ht 62.0 in | Wt 164.0 lb

## 2015-07-01 DIAGNOSIS — I272 Other secondary pulmonary hypertension: Secondary | ICD-10-CM

## 2015-07-01 DIAGNOSIS — I340 Nonrheumatic mitral (valve) insufficiency: Secondary | ICD-10-CM | POA: Diagnosis not present

## 2015-07-01 DIAGNOSIS — R0602 Shortness of breath: Secondary | ICD-10-CM

## 2015-07-01 DIAGNOSIS — N183 Chronic kidney disease, stage 3 unspecified: Secondary | ICD-10-CM

## 2015-07-01 DIAGNOSIS — E785 Hyperlipidemia, unspecified: Secondary | ICD-10-CM

## 2015-07-01 DIAGNOSIS — I10 Essential (primary) hypertension: Secondary | ICD-10-CM | POA: Diagnosis not present

## 2015-07-01 DIAGNOSIS — I5032 Chronic diastolic (congestive) heart failure: Secondary | ICD-10-CM | POA: Diagnosis not present

## 2015-07-01 DIAGNOSIS — J449 Chronic obstructive pulmonary disease, unspecified: Secondary | ICD-10-CM

## 2015-07-01 LAB — BASIC METABOLIC PANEL
BUN: 52 mg/dL — AB (ref 7–25)
CO2: 21 mmol/L (ref 20–31)
Calcium: 9.5 mg/dL (ref 8.6–10.4)
Chloride: 101 mmol/L (ref 98–110)
Creat: 2.37 mg/dL — ABNORMAL HIGH (ref 0.60–0.93)
GLUCOSE: 89 mg/dL (ref 65–99)
Potassium: 4.6 mmol/L (ref 3.5–5.3)
Sodium: 139 mmol/L (ref 135–146)

## 2015-07-01 NOTE — Patient Instructions (Addendum)
Medication Instructions:  Your physician recommends that you continue on your current medications as directed. Please refer to the Current Medication list given to you today. Labwork: TODAY BMET Testing/Procedures: Your physician has requested that you have an echocardiogram. Echocardiography is a painless test that uses sound waves to create images of your heart. It provides your doctor with information about the size and shape of your heart and how well your heart's chambers and valves are working. This procedure takes approximately one hour. There are no restrictions for this procedure. Follow-Up: SCOTT WEAVER, PAC 6 WEEKS SAME DAY DR. Angelena Form IS IN THE OFFICE Any Other Special Instructions Will Be Listed Below (If Applicable). If you need a refill on your cardiac medications before your next appointment, please call your pharmacy.

## 2015-07-01 NOTE — Telephone Encounter (Signed)
Refill done.  

## 2015-07-01 NOTE — Progress Notes (Signed)
Cardiology Office Note:    Date:  07/01/2015   ID:  [name], DOB [birth year], MRN [id number]  PCP:  Cathlean Cower, MD  Cardiologist:  Dr. Lauree Chandler   Electrophysiologist:  N/a Pulmonology: Dr. Kara Mead Oncology: Dr. Julien Nordmann  Referring MD: Biagio Borg, MD   Chief Complaint  Patient presents with  . Leg Swelling    History of Present Illness:     [name] is a 75 y.o. female with a hx of mild valvular heart disease, chronic diastolic CHF, HTN, HL, prior pericarditis, Lung CA s/p LUL lobectomy in 2010, COPD/ILD, NSVT.   Last echo in 7/16 demonstrated normal LV function, mild diastolic dysfunction, mild AI, mild MR, moderate to severe TR and moderate to severe pulmonary hypertension with a PASP of 72 mmHg.   Last seen in this clinic by Lyda Jester, PA-C in 2/17.    Seen in ED last month for leg edema. [place] was neg for DVT.  Referred by PCP to sports medicine.  Seen by Dr. Charlann Boxer who felt edema was 2/2 CHF and she is referred back for further evaluation.    Here today with her son.  She is on continuous O2.  She feels like her breathing has gotten worse over several weeks.  She is not due to see Pulmonology again until 2019 for FU.  She denies chest pain. Denies syncope. Denies orthopnea, PND.  Edema is better since Dr. Tamala Julian increased her Lasix.  Denies cough, wheezing, bleeding issues.     Past Medical History  Diagnosis Date  . HYPERLIPIDEMIA 11/18/2006  . HYPERKALEMIA 02/05/2008  . ANEMIA-IRON DEFICIENCY 09/22/2008  . Anemia of other chronic disease 02/05/2008  . ANXIETY 06/03/2008  . DEPRESSION 04/28/2009  . HYPERTENSION 11/18/2006  . PERICARDITIS 01/03/2007  . AORTIC STENOSIS/ INSUFFICIENCY, NON-RHEUMATIC 12/03/2008  . SINUSITIS- ACUTE-NOS 11/20/2007  . SINUSITIS, CHRONIC 02/05/2008  . ALLERGIC RHINITIS 11/18/2006  . PNEUMONIA 05/02/2007  . C O P D 06/27/2008  . Stricture and stenosis of esophagus 11/14/2008  . GERD 01/03/2007  . BARRETTS ESOPHAGUS  11/14/2008  . DYSPEPSIA 03/23/2007  . PRURITUS 10/08/2008  . OSTEOARTHRITIS 11/18/2006  . OSTEOARTHRITIS, KNEE, LEFT 01/03/2007  . Cervicalgia 01/13/2009  . BACK PAIN 02/04/2009    is improved  . BURSITIS, LEFT HIP 04/18/2009  . OSTEOPOROSIS 11/18/2006  . INSOMNIA-SLEEP DISORDER-UNSPEC 06/03/2008  . FATIGUE 04/18/2009  . PERIPHERAL EDEMA 02/13/2009  . MURMUR 11/18/2006  . DYSPHAGIA UNSPECIFIED 03/23/2007  . Abdominal pain, generalized 03/01/2007  . Nonspecific (abnormal) findings on radiological and other examination of body structure 06/03/2008  . Tuberculin Test Reaction 11/18/2006  . Acute bronchitis 02/23/2010  . CHOLELITHIASIS 04/24/2010  . NEPHROLITHIASIS, HX OF 04/24/2010  . Polyarthralgia 06/09/2010  . Elevated sed rate 06/11/2010  . Positive ANA (antinuclear antibody) 06/11/2010  . Rheumatoid factor positive 06/11/2010  . Cancer ([place])     lung ca  . 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 SPONDYLOSIS, CERVICAL, WITH RADICULOPATHY 01/13/2009  . Cervical radiculopathy 09/28/2010  . Lumbar radiculopathy 09/28/2010  . RENAL INSUFFICIENCY 02/05/2008    function has improved.  . Transfusion history 09-01-12    4'14 tranfused x 2 units  . Bleeding nose 09-01-12    past hx. none recent after having surgery  . Wound healing, delayed 09-01-12    right abdominal area    Past Surgical History  Procedure Laterality Date  . Abdominal hysterectomy    . Tubal ligation    . Tumor removal  08/06/08  from lungs  . Anterior cervical decomp/discectomy fusion  01/15/2011    Procedure: ANTERIOR CERVICAL DECOMPRESSION/DISCECTOMY FUSION 2 LEVELS;  Surgeon: Cooper Render Pool;  Location: [facility];  Service: Neurosurgery;  Laterality: N/A;  Cervical Three-Four, Cervical Four-Five Anterior Cervical Decompression Fusion   . Colonscopy    . Nose surgery    . Laparoscopic appendectomy N/A 05/25/2012    Procedure: APPENDECTOMY LAPAROSCOPIC  CONVERTED TO  OPEN APPENDECTOMY, exploratory laparatomy;  Surgeon: Odis Hollingshead, MD;  Location: [facility];  Service: General;  Laterality: N/A;  . Bowel resection N/A 05/25/2012    Procedure: SMALL BOWEL RESECTION;  Surgeon: Odis Hollingshead, MD;  Location: [facility];  Service: General;  Laterality: N/A;  . Laparoscopic lysis of adhesions N/A 05/25/2012    Procedure: LAPAROSCOPIC LYSIS OF ADHESIONS;  Surgeon: Odis Hollingshead, MD;  Location: [facility];  Service: General;  Laterality: N/A;  . Appendectomy    . Cataract extraction, bilateral  09-01-12  . Debridement of abdominal wall abscess N/A 09/07/2012    Procedure:  ABDOMINAL WOUND IRRIGATION AND TWO LAYERED CLOSURE;  Surgeon: Odis Hollingshead, MD;  Location: [facility];  Service: General;  Laterality: N/A;    Current Medications: Outpatient Prescriptions Prior to Visit  Medication Sig Dispense Refill  . acetaminophen (TYLENOL) 500 MG tablet Take 1,000 mg by mouth every 6 (six) hours as needed (pain).    Marland Kitchen alendronate (FOSAMAX) 70 MG tablet Take 1 tablet (70 mg total) by mouth every 7 (seven) days. Take with a full glass of water on an empty stomach. Patient takes on Mondays. 4 tablet 11  . aspirin 325 MG tablet Take 325 mg by mouth daily.    . Calcium Citrate-Vitamin D (CALCIUM + D PO) Take 1 tablet by mouth daily.    . citalopram (CELEXA) 10 MG tablet Take 1 tablet (10 mg total) by mouth daily. 90 tablet 2  . FIBER SELECT GUMMIES CHEW Chew 2 tablets by mouth every other day.     . furosemide (LASIX) 40 MG tablet Take 2 tablets (80 mg total) by mouth 2 (two) times daily. 120 tablet 5  . KLOR-CON M20 20 MEQ tablet TAKE 1 TABLET BY MOUTH DAILY 30 tablet 5  . metoprolol succinate (TOPROL-XL) 50 MG 24 hr tablet Take 1 tablet (50 mg total) by mouth daily. Take with or immediately following a meal. 90 tablet 3  . oxyCODONE-acetaminophen (ROXICET) 5-325 MG tablet Take 1 tablet by mouth every 8 (eight) hours as needed for severe pain. 40 tablet 0  . pantoprazole (PROTONIX) 40 MG tablet TAKE 1 TABLET (40 MG TOTAL) BY MOUTH  DAILY. 90 tablet 3  . pravastatin (PRAVACHOL) 40 MG tablet TAKE 1 TABLET BY MOUTH EVERY DAY 90 tablet 3  . tamsulosin (FLOMAX) 0.4 MG CAPS capsule TAKE ONE CAPSULE BY MOUTH EVERY DAY 30 capsule 5  . tizanidine (ZANAFLEX) 2 MG capsule Take 1 capsule (2 mg total) by mouth at bedtime as needed for muscle spasms. 30 capsule 1  . predniSONE (DELTASONE) 50 MG tablet Take 1 tablet (50 mg total) by mouth daily. (Patient not taking: Reported on 07/01/2015) 5 tablet 0  . traMADol (ULTRAM) 50 MG tablet TAKE 1 TABLET EVERY 8 HOURS AS NEEDED (Patient not taking: Reported on 07/01/2015) 90 tablet 2   No facility-administered medications prior to visit.      Allergies:   Amoxicillin; Fentanyl; Hydrocodone; Lactose intolerance (gi); Codeine; and Penicillins   Social History   Social History  . Marital  Status: Widowed    Spouse Name: N/A  . Number of Children: N/A  . Years of Education: N/A   Social History Main Topics  . Smoking status: Former Smoker -- 1.50 packs/day for 40 years    Types: Cigarettes    Quit date: 02/15/2006  . Smokeless tobacco: Former Systems developer  . Alcohol Use: No  . Drug Use: No  . Sexual Activity: Yes    Birth Control/ Protection: Post-menopausal   Other Topics Concern  . None   Social History Narrative   Lives alone in one level home     Family History:  The patient's family history includes Cancer in her brother; Coronary artery disease in her other; Heart disease in her father; Hyperlipidemia in her other; Hypertension in her other; Stroke in her father. There is no history of Colon cancer, Esophageal cancer, Rectal cancer, Stomach cancer, or Heart attack.   ROS:   Please see the history of present illness.    Review of Systems  Cardiovascular: Positive for dyspnea on exertion and leg swelling.  Hematologic/Lymphatic: Bruises/bleeds easily.  Musculoskeletal: Positive for joint pain.   All other systems reviewed and are negative.   Physical Exam:    VS:  BP 130/60  mmHg  Pulse 70  Ht '5\' 2"'$  (1.575 m)  Wt 164 lb (74.39 kg)  BMI 29.99 kg/m2  SpO2 94%   GEN: Well nourished, well developed, in no acute distress HEENT: normal Neck: no JVD, no masses Cardiac: Normal S1/S2, RRR; no murmurs, rubs, or gallops, no edema;     Respiratory:  Decreased breath sounds bilaterally; no wheezing, rhonchi or rales GI: soft, nontender, nondistended MS: no deformity or atrophy Skin: warm and dry Neuro: No focal deficits  Psych: Alert and oriented x 3, normal affect  Wt Readings from Last 3 Encounters:  07/01/15 164 lb (74.39 kg)  06/03/15 165 lb (74.844 kg)  04/21/15 167 lb (75.751 kg)      Studies/Labs Reviewed:     EKG:  EKG is   ordered today.  The ekg ordered today demonstrates NSR, HR 69, normal axis, NSSTTW changes, QTc 456 ms  Recent Labs: 09/12/2014: B Natriuretic Peptide 626.5* 12/31/2014: ALT 14 01/31/2015: Pro B Natriuretic peptide (BNP) 153.0*; TSH 2.98 05/27/2015: BUN 42*; Creatinine, Ser 2.07*; Hemoglobin 11.1*; Platelets 217; Potassium 4.4; Sodium 139   Recent Lipid Panel    Component Value Date/Time   CHOL 166 08/13/2014 1103   TRIG 149.0 08/13/2014 1103   HDL 38.80* 08/13/2014 1103   CHOLHDL 4 08/13/2014 1103   VLDL 29.8 08/13/2014 1103   LDLCALC 97 08/13/2014 1103   LDLDIRECT 141.9 05/02/2006 1311    Additional studies/ records that were reviewed today include:   Venous duplex 4/17 - No evidence of deep vein or superficial thrombosis involving the left lower extremity and right common femoral vein.  Echo 7/16 Mild LVH, EF 60-65%, no RWMA, Gr 1 DD, Ao sclerosis, trivial to mild AI, mild MR, LA upper limits of normal, normal RVSF, severe RAE, mod to severe TR, PASP 72 mmHg (mod to severe pulmonary HTN)  LHC 9/02 LM ok LAD ok LCx ok RCA ok Normal coronary arteries EF 60%  ASSESSMENT:     1. SOB (shortness of breath)   2. Chronic diastolic CHF (congestive heart failure) (HCC)   3. Pulmonary hypertension, moderate to severe  ([place])   4. Essential hypertension   5. COPD, moderate ([place])   6. CKD (chronic kidney disease), stage III     PLAN:  In order of problems listed above:  1. Dyspnea - I suspect that her dyspnea and LE edema are related to heart failure and chronic lung disease. Will obtain an EKG today to ensure that there have been no significant changes. It has not been quite a year since her last echo. However, since she has noticed a change in her dyspnea, I will obtain a follow-up echocardiogram. Follow-up in the next 6 weeks.  2. Chronic diastolic CHF -  On exam, it appears that her volume is improved since Lasix dose was increased. Continue current dose of Lasix. Obtain follow-up echocardiogram. Check BMET today.  3. Pulmonary HTN - Secondary to ILD and CHF.  Reviewed notes by pulmonology. She is not a candidate for vasodilators.  Some of her edema is likely related to RV dysfunction.  Obtain echo.    4. HTN - Controlled.  5. COPD - She is followed by pulmonology for COPD/interstitial lung disease. Continue follow-up with pulmonology as planned. Continue O2.  6. CKD - Obtain follow-up BMET today.   Medication Adjustments/Labs and Tests Ordered: Current medicines are reviewed at length with the patient today.  Concerns regarding medicines are outlined above.  Medication changes, Labs and Tests ordered today are outlined in the Patient Instructions noted below. Patient Instructions  Medication Instructions:  Your physician recommends that you continue on your current medications as directed. Please refer to the Current Medication list given to you today. Labwork: TODAY BMET Testing/Procedures: Your physician has requested that you have an echocardiogram. Echocardiography is a painless test that uses sound waves to create images of your heart. It provides your doctor with information about the size and shape of your heart and how well your heart's chambers and valves are working. This procedure  takes approximately one hour. There are no restrictions for this procedure. Follow-Up: [facility], PAC 6 WEEKS SAME DAY DR. Angelena Form IS IN THE OFFICE Any Other Special Instructions Will Be Listed Below (If Applicable). If you need a refill on your cardiac medications before your next appointment, please call your pharmacy.    Signed, Richardson Dopp, PA-C  07/01/2015 2:04 PM    [facility] [address], [place], [place]  [address] Phone: [phone]; Fax: [phone]

## 2015-07-03 ENCOUNTER — Telehealth: Payer: Self-pay | Admitting: *Deleted

## 2015-07-03 ENCOUNTER — Other Ambulatory Visit: Payer: Self-pay | Admitting: *Deleted

## 2015-07-03 DIAGNOSIS — I5032 Chronic diastolic (congestive) heart failure: Secondary | ICD-10-CM

## 2015-07-03 NOTE — Telephone Encounter (Signed)
Pt notified of lab results. Pt would like to get repeat lab work done 5/31 when she comes in for echo. Pt states her son drives from [place] to bring her to her appts and it would be easier to do lab on 5/31.

## 2015-07-05 ENCOUNTER — Encounter: Payer: Self-pay | Admitting: Family Medicine

## 2015-07-07 ENCOUNTER — Telehealth: Payer: Self-pay

## 2015-07-07 NOTE — Telephone Encounter (Signed)
On 07/07/2015 I received a death certificate from [facility] (original). The death certificate is for burial. The patient is a patient of Doctor Cathlean Cower. The death certificate will be taken to [facility] this pm for signature. On 2015-07-26 I received the death certificate back from Doctor Cathlean Cower. I got the death certificate ready and called the funeral home to let them know the death certificate is ready for pickup.

## 2015-07-16 ENCOUNTER — Other Ambulatory Visit (HOSPITAL_COMMUNITY): Payer: Medicare HMO

## 2015-07-16 ENCOUNTER — Other Ambulatory Visit: Payer: Medicare HMO

## 2015-07-17 NOTE — Progress Notes (Signed)
Received phone call from [facility]. Patient passed away this morning. Gave verbal order to release death cert. to be signed by her primary Dr. Jenny Reichmann.  [place]

## 2015-07-17 DEATH — deceased

## 2015-08-08 ENCOUNTER — Ambulatory Visit: Payer: Medicare HMO | Admitting: Physician Assistant

## 2015-09-24 ENCOUNTER — Ambulatory Visit: Payer: Medicare HMO | Admitting: Internal Medicine

## 2015-12-24 ENCOUNTER — Other Ambulatory Visit: Payer: Self-pay | Admitting: Internal Medicine
